# Patient Record
Sex: Male | Born: 1955 | Race: Black or African American | Hispanic: No | Marital: Single | Smoking: Never smoker
Health system: Southern US, Community
[De-identification: ages and names within clinical notes are randomized; demographics above are authoritative.]

## PROBLEM LIST (undated history)

## (undated) DIAGNOSIS — I4892 Unspecified atrial flutter: Secondary | ICD-10-CM

## (undated) DIAGNOSIS — I10 Essential (primary) hypertension: Secondary | ICD-10-CM

## (undated) DIAGNOSIS — I639 Cerebral infarction, unspecified: Secondary | ICD-10-CM

## (undated) DIAGNOSIS — I428 Other cardiomyopathies: Secondary | ICD-10-CM

## (undated) DIAGNOSIS — I509 Heart failure, unspecified: Secondary | ICD-10-CM

## (undated) HISTORY — PX: TRACHEOSTOMY: SUR1362

## (undated) HISTORY — DX: Unspecified atrial flutter: I48.92

---

## 2004-06-18 ENCOUNTER — Emergency Department (HOSPITAL_COMMUNITY): Admission: EM | Admit: 2004-06-18 | Discharge: 2004-06-18 | Payer: Self-pay | Admitting: Emergency Medicine

## 2004-07-09 ENCOUNTER — Emergency Department (HOSPITAL_COMMUNITY): Admission: EM | Admit: 2004-07-09 | Discharge: 2004-07-09 | Payer: Self-pay | Admitting: Emergency Medicine

## 2005-12-01 ENCOUNTER — Encounter (INDEPENDENT_AMBULATORY_CARE_PROVIDER_SITE_OTHER): Payer: Self-pay | Admitting: Cardiology

## 2005-12-01 ENCOUNTER — Inpatient Hospital Stay (HOSPITAL_COMMUNITY): Admission: EM | Admit: 2005-12-01 | Discharge: 2005-12-26 | Payer: Self-pay | Admitting: Emergency Medicine

## 2005-12-01 ENCOUNTER — Ambulatory Visit: Payer: Self-pay | Admitting: Critical Care Medicine

## 2005-12-11 ENCOUNTER — Ambulatory Visit: Payer: Self-pay | Admitting: Gastroenterology

## 2005-12-25 ENCOUNTER — Ambulatory Visit: Payer: Self-pay | Admitting: Dentistry

## 2005-12-26 ENCOUNTER — Inpatient Hospital Stay (HOSPITAL_COMMUNITY)
Admission: RE | Admit: 2005-12-26 | Discharge: 2006-01-22 | Payer: Self-pay | Admitting: Physical Medicine & Rehabilitation

## 2005-12-26 ENCOUNTER — Ambulatory Visit: Payer: Self-pay | Admitting: Physical Medicine & Rehabilitation

## 2006-01-24 ENCOUNTER — Ambulatory Visit: Payer: Self-pay | Admitting: Nurse Practitioner

## 2006-02-05 ENCOUNTER — Ambulatory Visit: Payer: Self-pay | Admitting: Nurse Practitioner

## 2006-02-14 ENCOUNTER — Encounter
Admission: RE | Admit: 2006-02-14 | Discharge: 2006-05-15 | Payer: Self-pay | Admitting: Physical Medicine & Rehabilitation

## 2006-02-24 ENCOUNTER — Ambulatory Visit: Payer: Self-pay | Admitting: Physical Medicine & Rehabilitation

## 2006-03-04 ENCOUNTER — Ambulatory Visit: Payer: Self-pay | Admitting: Nurse Practitioner

## 2006-04-15 ENCOUNTER — Encounter
Admission: RE | Admit: 2006-04-15 | Discharge: 2006-07-14 | Payer: Self-pay | Admitting: Physical Medicine & Rehabilitation

## 2006-04-23 ENCOUNTER — Ambulatory Visit: Payer: Self-pay | Admitting: Physical Medicine & Rehabilitation

## 2006-06-03 ENCOUNTER — Emergency Department (HOSPITAL_COMMUNITY): Admission: EM | Admit: 2006-06-03 | Discharge: 2006-06-03 | Payer: Self-pay | Admitting: Family Medicine

## 2006-06-30 ENCOUNTER — Encounter
Admission: RE | Admit: 2006-06-30 | Discharge: 2006-09-28 | Payer: Self-pay | Admitting: Physical Medicine & Rehabilitation

## 2006-06-30 ENCOUNTER — Ambulatory Visit: Payer: Self-pay | Admitting: Physical Medicine & Rehabilitation

## 2006-07-22 ENCOUNTER — Encounter
Admission: RE | Admit: 2006-07-22 | Discharge: 2006-08-15 | Payer: Self-pay | Admitting: Physical Medicine & Rehabilitation

## 2006-10-16 ENCOUNTER — Ambulatory Visit: Payer: Self-pay | Admitting: Family Medicine

## 2006-10-16 ENCOUNTER — Encounter (INDEPENDENT_AMBULATORY_CARE_PROVIDER_SITE_OTHER): Payer: Self-pay | Admitting: Nurse Practitioner

## 2006-10-16 LAB — CONVERTED CEMR LAB
Albumin: 4.8 g/dL (ref 3.5–5.2)
BUN: 15 mg/dL (ref 6–23)
CO2: 23 meq/L (ref 19–32)
Chloride: 102 meq/L (ref 96–112)
Creatinine, Ser: 0.93 mg/dL (ref 0.40–1.50)
Glucose, Bld: 213 mg/dL — ABNORMAL HIGH (ref 70–99)
HCT: 45.6 % (ref 39.0–52.0)
Hemoglobin: 15.1 g/dL (ref 13.0–17.0)
Lymphocytes Relative: 34 % (ref 12–46)
Lymphs Abs: 2.2 10*3/uL (ref 0.7–3.3)
MCV: 86.5 fL (ref 78.0–100.0)
Monocytes Relative: 7 % (ref 3–11)
Neutro Abs: 3.7 10*3/uL (ref 1.7–7.7)
Neutrophils Relative %: 56 % (ref 43–77)
PSA: 0.57 ng/mL (ref 0.10–4.00)
Total Bilirubin: 0.3 mg/dL (ref 0.3–1.2)

## 2006-10-22 ENCOUNTER — Encounter (INDEPENDENT_AMBULATORY_CARE_PROVIDER_SITE_OTHER): Payer: Self-pay | Admitting: Nurse Practitioner

## 2006-10-22 ENCOUNTER — Ambulatory Visit: Payer: Self-pay | Admitting: Family Medicine

## 2006-10-22 LAB — CONVERTED CEMR LAB
HDL: 37 mg/dL — ABNORMAL LOW (ref >39)
Total CHOL/HDL Ratio: 4.2
VLDL: 39 mg/dL (ref 0–40)

## 2007-01-11 ENCOUNTER — Emergency Department (HOSPITAL_COMMUNITY): Admission: EM | Admit: 2007-01-11 | Discharge: 2007-01-11 | Payer: Self-pay | Admitting: Emergency Medicine

## 2007-01-22 ENCOUNTER — Ambulatory Visit: Payer: Self-pay | Admitting: Internal Medicine

## 2007-02-12 ENCOUNTER — Ambulatory Visit: Payer: Self-pay | Admitting: Family Medicine

## 2007-02-12 ENCOUNTER — Encounter (INDEPENDENT_AMBULATORY_CARE_PROVIDER_SITE_OTHER): Payer: Self-pay | Admitting: Nurse Practitioner

## 2007-02-12 LAB — CONVERTED CEMR LAB
ALT: 26 units/L (ref 0–53)
Alkaline Phosphatase: 108 units/L (ref 39–117)
CO2: 24 meq/L (ref 19–32)
Calcium: 9.5 mg/dL (ref 8.4–10.5)
Eosinophils Absolute: 0.2 10*3/uL (ref 0.0–0.7)
Eosinophils Relative: 3 % (ref 0–5)
Glucose, Bld: 136 mg/dL — ABNORMAL HIGH (ref 70–99)
Hemoglobin: 14.6 g/dL (ref 13.0–17.0)
Lymphocytes Relative: 38 % (ref 12–46)
Lymphs Abs: 2.5 10*3/uL (ref 0.7–4.0)
Neutro Abs: 3.6 10*3/uL (ref 1.7–7.7)
RBC: 5.18 M/uL (ref 4.22–5.81)
RDW: 15 % (ref 11.5–15.5)
Total CHOL/HDL Ratio: 3.5
VLDL: 28 mg/dL (ref 0–40)
WBC: 6.7 10*3/uL (ref 4.0–10.5)

## 2007-02-26 ENCOUNTER — Ambulatory Visit: Payer: Self-pay | Admitting: Family Medicine

## 2007-05-26 ENCOUNTER — Ambulatory Visit: Payer: Self-pay | Admitting: Internal Medicine

## 2007-07-07 ENCOUNTER — Ambulatory Visit: Payer: Self-pay | Admitting: Internal Medicine

## 2007-07-07 ENCOUNTER — Encounter (INDEPENDENT_AMBULATORY_CARE_PROVIDER_SITE_OTHER): Payer: Self-pay | Admitting: Nurse Practitioner

## 2007-07-07 LAB — CONVERTED CEMR LAB
LDL Cholesterol: 62 mg/dL (ref 0–99)
Total CHOL/HDL Ratio: 3.9
Triglycerides: 140 mg/dL (ref <150)

## 2007-11-03 ENCOUNTER — Encounter: Admission: RE | Admit: 2007-11-03 | Discharge: 2008-01-07 | Payer: Self-pay | Admitting: Internal Medicine

## 2008-03-03 ENCOUNTER — Emergency Department (HOSPITAL_COMMUNITY): Admission: EM | Admit: 2008-03-03 | Discharge: 2008-03-03 | Payer: Self-pay | Admitting: Emergency Medicine

## 2008-04-28 DIAGNOSIS — G43909 Migraine, unspecified, not intractable, without status migrainosus: Secondary | ICD-10-CM | POA: Insufficient documentation

## 2008-04-28 DIAGNOSIS — E785 Hyperlipidemia, unspecified: Secondary | ICD-10-CM | POA: Insufficient documentation

## 2008-04-28 DIAGNOSIS — E119 Type 2 diabetes mellitus without complications: Secondary | ICD-10-CM | POA: Insufficient documentation

## 2008-04-28 DIAGNOSIS — Z8673 Personal history of transient ischemic attack (TIA), and cerebral infarction without residual deficits: Secondary | ICD-10-CM | POA: Insufficient documentation

## 2008-05-02 ENCOUNTER — Ambulatory Visit: Payer: Self-pay | Admitting: Cardiovascular Disease

## 2008-05-02 DIAGNOSIS — R9431 Abnormal electrocardiogram [ECG] [EKG]: Secondary | ICD-10-CM

## 2008-05-17 ENCOUNTER — Encounter: Payer: Self-pay | Admitting: Cardiovascular Disease

## 2008-05-17 ENCOUNTER — Ambulatory Visit: Payer: Self-pay

## 2008-06-02 ENCOUNTER — Ambulatory Visit: Payer: Self-pay | Admitting: Cardiovascular Disease

## 2008-06-02 DIAGNOSIS — I429 Cardiomyopathy, unspecified: Secondary | ICD-10-CM | POA: Insufficient documentation

## 2008-08-08 ENCOUNTER — Encounter: Admission: RE | Admit: 2008-08-08 | Discharge: 2008-10-06 | Payer: Self-pay | Admitting: Family Medicine

## 2008-11-17 ENCOUNTER — Emergency Department (HOSPITAL_COMMUNITY): Admission: EM | Admit: 2008-11-17 | Discharge: 2008-11-17 | Payer: Self-pay | Admitting: Family Medicine

## 2009-05-10 ENCOUNTER — Encounter: Admission: RE | Admit: 2009-05-10 | Discharge: 2009-08-08 | Payer: Self-pay | Admitting: Family Medicine

## 2009-07-04 ENCOUNTER — Ambulatory Visit (HOSPITAL_COMMUNITY): Admission: RE | Admit: 2009-07-04 | Discharge: 2009-07-04 | Payer: Self-pay | Admitting: Cardiovascular Disease

## 2009-07-04 ENCOUNTER — Ambulatory Visit: Payer: Self-pay | Admitting: Cardiovascular Disease

## 2009-07-04 ENCOUNTER — Ambulatory Visit: Payer: Self-pay | Admitting: Cardiology

## 2009-07-04 ENCOUNTER — Ambulatory Visit: Payer: Self-pay

## 2010-01-28 ENCOUNTER — Encounter: Payer: Self-pay | Admitting: Physical Medicine & Rehabilitation

## 2010-02-06 NOTE — Assessment & Plan Note (Signed)
Summary: [name]/DM   Visit Type:  1 yr f/u Primary Provider:  Raquel James  CC:  no cardiac complaints today.  History of Present Illness: 55 yo AAM with history of DM, hyperlipidemia and prior CVA in 2007 here today for cardiac follow up. The patient had a large CVA in November 2007 and since then has been disabled with some residual right sided weakness and speech difficulty. He was seen by his primary care physician in May 2010 and had an abnormal EKG which showed nonspecific T wave abnormalities and first degree AV block. Echo showed normal LV systolic function with moderate LVH. He is here today for repeat echo and visit. The echo is outlined in detail below.   No CP, SOB, palpitations. ROS o/w negative.   Current Medications (verified): 1)  Glipizide 10 Mg Tabs (Glipizide) .[name].. 1 Tab Once Daily 2)  Aspirin 81 Mg Tbec (Aspirin) .... Take One Tablet By Mouth Daily 3)  Metformin Hcl 1000 Mg Tabs (Metformin Hcl) .[name].. 1 Tab Two Times A Day 4)  Simvastatin 40 Mg Tabs (Simvastatin) .[name].. 1 Tab At Bedtime 5)  Senna 8.6 Mg Caps (Sennosides) .... As Needed 6)  Multivitamins   Tabs (Multiple Vitamin) .[name].. 1 Tab Once Daily 7)  Fish Oil 1000 Mg Caps (Omega-3 Fatty Acids) .... 2 Caps Daily  Allergies (verified): No Known Drug Allergies  Past History:  Past Medical History: MIGRAINE HEADACHE (ICD-346.90) CVA (ICD-434.91) DIABETES MELLITUS, TYPE II (ICD-250.00) HYPERLIPIDEMIA-MIXED (ICD-272.4) Moderate LVH    Social History: Reviewed history from 05/02/2008 and no changes required. Tobacco Use - Former, quit 2007 Alcohol Use - no -- heavy until Nov 07  Middle school dropout Disabled  Review of Systems  The patient denies fatigue, malaise, fever, weight gain/loss, vision loss, decreased hearing, hoarseness, chest pain, palpitations, shortness of breath, prolonged cough, wheezing, sleep apnea, coughing up blood, abdominal pain, blood in stool, nausea, vomiting, diarrhea, heartburn,  incontinence, blood in urine, muscle weakness, joint pain, leg swelling, rash, skin lesions, headache, fainting, dizziness, depression, anxiety, enlarged lymph nodes, easy bruising or bleeding, and environmental allergies.    Vital Signs:  Patient profile:   55 year old male Height:      66 inches Weight:      168 pounds BMI:     27.21 Pulse rate:   64 / minute Pulse rhythm:   regular BP sitting:   124 / 80  (left arm) Cuff size:   regular  Vitals Entered By: Danielle Rankin, CMA (July 04, 2009 9:50 AM)  Physical Exam  General:  General: Well developed, well nourished, NAD HEENT: OP clear, mucus membranes moist Psychiatric: Mood and affect normal Neck: No JVD, no carotid bruits, no thyromegaly, no lymphadenopathy. Lungs:Clear bilaterally, no wheezes, rhonci, crackles CV: RRR no murmurs, gallops rubs Abdomen: soft, NT, ND, BS present Extremities: No edema, pulses 2+.    Echocardiogram  Procedure date:  07/04/2009  Findings:      Study Conclusions            - Left ventricle: There is mild hypokinesis of the inferior wall.       There may be pseudonormalization of the mitral inflow signal. The       estimated ejection fraction was 55%.     - Left atrium: The atrium was moderately dilated.     - Pericardium, extracardiac: A trivial pericardial effusion was       identified posterior to the heart.  EKG  Procedure date:  07/04/2009  Findings:  Sinus rhythm with 1st degree AV block. T wave changes inferolaterally.   Impression & Recommendations:  Problem # 1:  CARDIOMYOPATHY, SECONDARY (ICD-425.9) Mild to  moderate LVH with normal LV systolic function.  Doing well. Blood pressure is normal. Will repeat echo in one year.   His updated medication list for this problem includes:    Aspirin 81 Mg Tbec (Aspirin) .[name]... Take one tablet by mouth daily  Patient Instructions: 1)  Your physician recommends that you schedule a follow-up appointment in: 12 months 2)  Your  physician recommends that you continue on your current medications as directed. Please refer to the Current Medication list given to you today. 3)  Your physician has requested that you have an echocardiogram.  Echocardiography is a painless test that uses sound waves to create images of your heart. It provides your doctor with information about the size and shape of your heart and how well your heart's chambers and valves are working.  This procedure takes approximately one hour. There are no restrictions for this procedure. To be done in 12 months

## 2010-04-11 LAB — POCT URINALYSIS DIP (DEVICE)
Glucose, UA: NEGATIVE mg/dL
Ketones, ur: NEGATIVE mg/dL
Nitrite: NEGATIVE
Protein, ur: NEGATIVE mg/dL
Specific Gravity, Urine: <=1.005 (ref 1.005–1.030)
pH: 6 (ref 5.0–8.0)

## 2010-04-24 LAB — DIFFERENTIAL
Lymphs Abs: 2.6 10*3/uL (ref 0.7–4.0)
Monocytes Relative: 6 % (ref 3–12)
Neutro Abs: 3.3 10*3/uL (ref 1.7–7.7)
Neutrophils Relative %: 49 % (ref 43–77)

## 2010-04-24 LAB — POCT I-STAT, CHEM 8
BUN: 13 mg/dL (ref 6–23)
Calcium, Ion: 1.15 mmol/L (ref 1.12–1.32)
HCT: 47 % (ref 39.0–52.0)
Potassium: 3.8 mEq/L (ref 3.5–5.1)

## 2010-04-24 LAB — SEDIMENTATION RATE: Sed Rate: 10 mm/hr (ref 0–16)

## 2010-04-24 LAB — CBC
MCHC: 33.8 g/dL (ref 30.0–36.0)
Platelets: 175 10*3/uL (ref 150–400)

## 2010-04-24 LAB — GLUCOSE, CAPILLARY: Glucose-Capillary: 59 mg/dL — ABNORMAL LOW (ref 70–99)

## 2010-05-22 NOTE — Assessment & Plan Note (Signed)
The patient returns to the clinic today accompanied by his mother. The  patient is doing reasonably well overall. He does have the need for some  dental care and they are requesting a slip stating that he can undergo  general anesthesia. He continues to take his Glucophage and his recent  blood sugar was 165. He is using a quad cane most of the time for  ambulation and mostly independent with bathing and dressing. He does  need a refill on the Glucophage and pravastatin as he has no primary  care physician.   REVIEW OF SYSTEMS:  Noncontributory.   MEDICATIONS:  1. Metformin 500 mg 0.5 tablet b.i.d.  2. Pravastatin 40 mg.  3. Aspirin 81 mg.  4. Sof-Gel daily.  5. Multivitamin daily.   PHYSICAL EXAMINATION:  GENERAL APPEARANCE: Reasonably well-appearing,  middle-aged, adult male with significant deficits involving his left  upper and left lower extremity.  VITAL SIGNS: Blood pressure is 162/97 with a pulse of 101, respiratory  rate 23 and 02 saturation 93% on room air.  EXTREMITIES: Left upper and left lower extremity strength was 2/5 and  right upper and lower extremity strength was 4+/5.   IMPRESSION:  1. Status post right middle cerebral artery infarct with vessel      perforation and subsequent subarachnoid hemorrhage.  2. Type 2 diabetes mellitus.  3. Dyslipidemia.   In the office today we did give the patient a refill on his metformin  along with his pravastatin. Will plan on seeing him in followup in  approximately 3 to 4 months time with refills prior to that appointment  as necessary. Apparently he does not attend health or primary care  office at this time.           ______________________________  Ellwood Dense, M.D.     DC/MedQ  D:  07/03/2006 11:57:49  T:  07/03/2006 13:44:01  Job #:  [id number]

## 2010-05-25 NOTE — Assessment & Plan Note (Signed)
Mr. Kelleher returns to the clinic today accompanied by his mother.  He  is a 55 year old adult male with history of migraine headaches who  suffered left middle cerebral artery stroke.  He had a merci clot  retrieval done after a cerebral angiogram showed a blockage.  He was on  a ventilator for a short period of time and required a tracheostomy.  He  was on the rehabilitation unit through January 22, 2006.   The patient continues to attend outpatient physical and occupational  therapy at [facility].  He still needs some help with bathing and  dressing but now is able to walk slowly with a quad cane with a  hemiparetic gait on the right side.  He is due to follow up with [facility] May 07, 2006 but needs a refill on his metformin in the office  today.  His mother reports his blood sugar this morning was 147.   REVIEW OF SYSTEMS:  Noncontributory.   MEDICATIONS:  1. Metformin 500 mg one-half tablet b.i.d.  2. Pravastatin 40 mg daily.  3. Aspirin 81 mg daily.  4. Soft gels p.r.n.  5. Multivitamin daily.   PHYSICAL EXAMINATION:  This is a reasonably well appearing, middle-aged  adult male with significant aphasia and significant comprehension  deficits.  He also has significant right-sided weakness.  Blood pressure  is 120/73, with a pulse 71, respiratory rate 16, O2 saturation 98% on  room air.  Left upper and left lower extremity strength were 5/5.  Right  upper extremity strength was 2/5 and right lower extremity strength was  3-/5.   IMPRESSION:  1. Status post right middle cerebral artery infarct with vessel      perforation and subsequent subarachnoid hemorrhage.  2. Type 2 diabetes mellitus.  3. Dyslipidemia.   In the office today we did refill the patient's metformin as noted  above.  We will plan on seeing him in followup in approximately 2 to 3  months' time with refill of medication as necessary.  He will continue  in outpatient therapies at this  point.           ______________________________  Ellwood Dense, M.D.     DC/MedQ  D:  04/24/2006 09:56:57  T:  04/24/2006 11:09:11  Job #:  [id number]

## 2010-05-25 NOTE — Op Note (Signed)
[name], [name]             ACCOUNT NO.:  [id number]   MEDICAL RECORD NO.:  [id number]          PATIENT TYPE:  INP   LOCATION:  2311                         FACILITY:  [facility]   PHYSICIAN:  Ollen Gross. Vernell Morgans, M.D. DATE OF BIRTH:  [birth year]   DATE OF PROCEDURE:  12/06/2005  DATE OF DISCHARGE:                               OPERATIVE REPORT   PREOPERATIVE DIAGNOSIS:  Stroke and ventilator dependent respiratory  failure.   POSTOPERATIVE DIAGNOSIS:  Stroke and ventilator dependent respiratory  failure.   PROCEDURE:  Tracheostomy.   SURGEON:  Ollen Gross. Vernell Morgans, M.D.   ANESTHESIA:  General endotracheal.   PROCEDURE:  After informed consent was obtained, the patient was brought  to the operating room, placed in supine position on the operating table.  After induction of general anesthesia, a roll was placed behind the  patient's shoulders to extend the neck slightly.  The neck was then  prepped with Betadine and draped in usual sterile manner.  The patient's  thyroid and cricoid cartilages were easy to palpate. A small vertically  oriented incision was made from the palpable cricoid cartilage  inferiorly for about 2 cm.  This incision was carried down through the  skin and subcutaneous tissue sharply with electrocautery.  Taking care  to keep the dissection along the midline until the tracheostomy was  reached.  The cricoid cartilage and tracheal rings were easily  identified.  At this point a tracheostomy hook was placed beneath the  cricoid cartilage and the trachea was elevated.  The space between the  second and third tracheal rings was opened sharply with electrocautery  and then bluntly with a hemostat.  At this point a size 8 Shiley trache  tube was chosen and the anesthesiologist let the balloon on the  endotracheal tube down and backed the tube up until it was just above  the opening that was made.  The new tracheostomy tube was then inserted  into the trachea through the  opening between rings.  The balloon was  inflated in the tracheostomy was connected to the ventilator.  The  patient had good chest rise, good CO2 return and good oxygen  saturations.  The tracheostomy tube was then anchored to the skin in  several places with 2-0 silk stitches and then the sterile dressings  were applied.  The patient tolerated procedure well.  At the end of  case, all needle, sponge instrument counts correct.  The patient was  then taken back to the ICU for further management.      Ollen Gross. Vernell Morgans, M.D.  Electronically Signed     PST/MEDQ  D:  12/08/2005  T:  12/09/2005  Job:  [id number]

## 2010-05-25 NOTE — H&P (Signed)
NAMESHAHZAIN, KIESTER             ACCOUNT NO.:  [id number]   MEDICAL RECORD NO.:  [id number]          PATIENT TYPE:  IPS   LOCATION:  NA                           FACILITY:  [facility]   PHYSICIAN:  Ellwood Dense, M.D.   DATE OF BIRTH:  [birth year]   DATE OF ADMISSION:  DATE OF DISCHARGE:                              HISTORY & PHYSICAL   PRIMARY CARE PHYSICIAN:  None.   NEUROLOGIST:  Pramod P. Pearlean Brownie, M.D.   SURGEON:  Ollen Gross. Vernell Morgans, M.D.   GASTROINTESTINAL SPECIALIST:  Barbette Hair. Arlyce Dice, MD,FACG   HISTORY OF THE PRESENT ILLNESS:  [name] is a previously-healthy  55 year old adult male with a history of migraine headaches.  He was  admitted on November 30, 2005 with a new onset of aphasia with right-  sided weakness.  This was found to be secondary to an occlusion in the  left middle cerebral artery.  He underwent cerebral angiogram with  attempts at a MERCI clot retrieval with vessel perforation and  subsequent subarachnoid hemorrhage.  He required a ventilator for  respiratory failure and was intubated with difficulty weaning.  He  required a tracheostomy placed December 06, 2005 by Dr. Carolynne Edouard, and a PEG  was placed on December 06, 2005 by Dr. Arlyce Dice for nutritional support.  He was treated with Rocephin IV for H. influenza pneumonia.   The patient was eventually extubated and was tolerating a T collar on  his tracheostomy.  He is presently tolerating room air with Passey-Muir  valve at all times.  He has been tolerating bolus feeds per his PEG tube  with water flushes added December 25, 2005 secondary to worsening renal  insufficiency.  Dr. Kristin Bruins was consulted for multiple dental caries.  The patient will require extraction with alveoloplasty with the need to  be off anticoagulation.  He is receiving chlorhexidine rinses for his  dental caries at the present time.  He is noted to have global aphasia  with dense right hemiparesis and a right field cut with decreased  attention.  He is able to sit at the edge of the bed 5-8 minutes and  perform some grooming activities.  He stands with posturing and with  truncal control that is slowly improving.   The patient was evaluated by the rehabilitation physicians and felt to  be an appropriate candidate for inpatient rehabilitation.   REVIEW OF SYSTEMS:  Positive for wound healing, along with weakness and  reported right shoulder pain.   PAST MEDICAL HISTORY:  History of migraines.   FAMILY HISTORY:  Positive for migraines and diabetes mellitus.   SOCIAL HISTORY:  The patient lives with his elderly mother.  The patient  is unemployed but works Engineer, agricultural odd jobs.  He smokes one pack of  cigarettes per day.  He has a history of heavy ethanol use, but  apparently quit alcohol one month prior to admission.   FUNCTIONAL HISTORY PRIOR TO ADMISSION:  Independent.   ALLERGIES:  No known drug allergies.   MEDICATIONS PRIOR TO ADMISSION:  None.   RECENT LABORATORIES:  Hemoglobin of 13.2, hematocrit 39.8, platelets  274,000,  white count of 7.8.  Recent sodium was 140, potassium 3.8,  chloride 102, CO2 of 30, BUN 26, creatinine 0.8 and glucose 116.  Recent  iron was 12 with TIBC of 24.  B-12 level was 453, and iron saturation  was 5%.  Recent ferritin was 329.  Cholesterol was 181, triglycerides  187, HDL cholesterol 67, and LDL cholesterol of 118.  Hemoglobin A1C was  6.3.  Bronchial alveolar culture showed non-pathogenic flora.   Chest x-ray on December 21, 2005 showed improving aeration in the right  middle lobe with persistent consolidation in the right base with mild  atelectasis of the left base.   PHYSICAL EXAMINATION:  VITAL SIGNS:  Temperature 97.8, respiratory rate  20, pulse 78 and blood pressure 103/68.  GENERAL:  Reasonably well-appearing middle aged adult male lying in bed  with his eyes open and following our conversation, but not consistently  conversant.  He has a tracheostomy in place with a  Passey-Muir valve.  HEENT:  Normocephalic and atraumatic.  CARDIOVASCULAR:  Regular rate and rhythm.  S1 and S2 without murmurs.  ABDOMEN:  Soft, nontender, with positive bowel sounds with PEG tube in  place without drainage.  LUNGS:  Clear to auscultation bilaterally.  NEUROLOGIC:  Alert, oriented times unknown with patient unable to voice  more than a few soft sounds which are repeated consistently.  He has  dense hemiparesis of the right arm and leg.  The patient has right  neglect with good strength in his left upper and left lower extremity.  He is unable to follow commands for manual muscle testing.  Sensation  appeared to be intact to light touch on his bilateral face.  He did  follow occasional simple single-step commands.   IMPRESSION:  1. Status post left middle cerebral artery ischemic stroke with      subsequent iatrogenic bleed after attempted MERCI clot retrieval      done November 30, 2005.  2. Severe dysphagia requiring the patient to be NPO with tube feedings      at the present time.  3. Ventilatory-dependent respiratory failure, stable and weaning from      O2 with down-sizing of tracheostomy planned and subsequent      discontinuing of tracheostomy over the next few days.   Presently, the patient has deficits in ADL's, transfers, ambulation,  speech and cognition secondary to his left middle cerebral artery  ischemic infarction and subsequent subarachnoid hemorrhage.   PLAN:  1. Admit to the rehabilitation unit for daily OT, PT and speech      therapies, to advance ADL's, transfers, ambulation, cognition,      swallowing, and speech.  2. Check admission labs including CBC and CMET Friday, December 27, 2005.  3. Down-size tracheostomy to #4 cuffless by respiratory therapy.  4. NPO with Jevity tube feedings 320 cc five times per day with water      flushes of 100 cc five times per day. 5. Continue Lovenox 40 mg subcutaneous daily for DVT prophylaxis.  6.  Continue aspirin 81 mg per PEG daily.  7. Continue Lasix 40 mg daily.  8. Lantus insulin 8 units subcutaneous q 7 p.m. and 4 units      subcutaneous q 7 a.m.  9. Routine PEG and tracheostomy care.  10.Decrease Seroquel to 25 mg per PEG q.h.s.  11.Klonopin 0.5 mg q.h.s. per PEG.  12.Cleanse oral cavity with Magic mouthwash on a __________ q.i.d. and  p.r.n.  13.Check PVR with bladder scans q shift, and in-and-out cath for      volumes greater than or equal to 300 cc.  14.Plug tracheostomy 24 hours a day with a Passey-Muir valve and O2      supplemented by nasal cannula.  15.Keep right upper extremity elevated on a pillow and then sling when      out of bed.  16.Check CBCs 5 times per day before scheduled bolus feeds.  17.Tylenol 325 mg 1-2 tablets per PEG q.4h. for pain.   PROGNOSIS:  Fair.   ESTIMATED LENGTH OF STAY:  15-25 days.   GOALS:  Minimal to moderate assistance for ADL's, transfers and  ambulation with hopeful discontinuation of tracheostomy over the next  few days and initiation of oral feeds as appropriate over the next  couple weeks.           ______________________________  Ellwood Dense, M.D.     DC/MEDQ  D:  12/26/2005  T:  12/26/2005  Job:  [id number]

## 2010-05-25 NOTE — Procedures (Signed)
CLINICAL HISTORY:  This is a portable EEG done at the patient's bedside.  The patient is described as unresponsive 55 year old male with history of  left brain stroke, EEG is for evaluation.   DESCRIPTION:  The dominant rhythm in this tracing is a very low amplitude  mixed theta-delta rhythm of 3-6 hertz which appears diffusely more  prominently in the posterior section and appears without obvious abnormal  asymmetry.  The supression and focal slowing seems more prominent on the  right especially in the temporal areas versus the left but depression is  fairly severe on both sides.  No epileptiform discharges are seen, no sleep  architecture is noted.  Photic stimulation and hyperventilation are not  performed.  Single channel devoted to EKG revealed sinus rhythm throughout  with greater than approximately 66 beats per minute.   CONCLUSIONS:  Abnormal study due to the presence of generalized slowing and  depression of background rhythms.  Finding suggestive of diffuse widespread  cerebral dysfunction consistent given history of encephalopathic state.  Findings are more severe on the left than on the right as might be seen with  the patient history of left brain stroke.  No epileptiform discharges are  seen.      Michael L. Thad Ranger, M.D.  Electronically Signed     [id number]  D:  12/02/2005 12:01:43  T:  12/02/2005 13:09:48  Job #:  [id number]

## 2010-05-25 NOTE — Discharge Summary (Signed)
[name], [name]             ACCOUNT NO.:  [id number]   MEDICAL RECORD NO.:  [id number]          PATIENT TYPE:  IPS   LOCATION:  4028                         FACILITY:  [facility]   PHYSICIAN:  Ellwood Dense, M.D.   DATE OF BIRTH:  07-31-1955   DATE OF ADMISSION:  12/26/2005  DATE OF DISCHARGE:  01/22/2006                               DISCHARGE SUMMARY   DISCHARGE DIAGNOSES:  1. 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  2. Diabetes mellitus type 2.  3. Dyslipidemia.   HISTORY OF PRESENT ILLNESS:  [name] is a 55 year old male with  history of migraines admitted November 24 with aphasia, right-sided  weakness secondary to left MCA stroke.  The patient underwent cerebral  angio with attempt at Merci clot retrieval with vessel perforation and  subarachnoid hemorrhage.  He was noted to have VDRF requiring intubation  and difficult __________ requiring trach November 30 by Dr. Carolynne Edouard and PEG  placed the same day by Dr. Arlyce Dice for nutritional support.  Was noted to  have H. flu pneumonia treated with Rocephin.  The patient extubated and  has been tolerating T collar, currently wearing Passy-Muir valve at all  times, able to tolerate bolus tube feeds.  He was noted to have multiple  dental caries; will require extractions and alveoplasty in the future  and will need to be off anticoagulation prior to this procedure.  Currently, the patient continues with global aphasia, dense right  hemiparesis, right field cut with inattention.  Able to sit at the edge  of bed with attempts at standing and postural truncal control ongoing.  Secondary to impairments in mobility and self-care, rehab consulted for  further therapies.   PAST MEDICAL HISTORY:  Significant for migraines.   ALLERGIES:  No known drug allergies.   FAMILY HISTORY:  Is positive for migraines and diabetes.   SOCIAL HISTORY:  The patient lives with the family.  Was  working odd  jobs prior to admission.  Has history of 1 pack per day tobacco use,  history of heavy alcohol use in the past, quit approximately a month  prior to admission.   HOSPITAL COURSE:  [name] was admitted to rehab on December 26, 2005 for inpatient therapies to consist of PT/OT daily.  Past  admission, he was maintained on subcu Lovenox for DVT prophylaxis and  low-dose aspirin for CVA prophylaxis.  CBGs were monitored with  a.c./h.s. CBG checks.  Hemoglobin A1c was noted to be elevated at 6.3.  Initially, the patient with sliding scale insulin secondary to tube  feeds.  Follow-up swallow study was done on December 27.  The patient  was started on D2 diet, thin liquids and was tolerating this with good  p.o. intake.  Diet has been advanced to D3, and currently, p.o. intake  is good.  CBG checks have been continued and __________ to elevated  blood sugars, he was started on Glucophage 250 mg b.i.d.  Blood sugars  currently ranging from 90s to 110.  Blood pressures monitored on b.i.d.  basis, noted to  be in high 90s to 110 systolic, 60s to 56O diastolic,  heart rate in 60s to 70s range.  The patient is noted to be continent of  bowel and bladder with toileting.   Labs done past admission revealed hemoglobin 12.8, hematocrit 37.6,  white count 7.8, platelets 224.  Check of electrolytes initially  revealed BUN elevated at 25.  Recheck on December 28 shows improvement  with sodium 137, potassium 4.3, chloride 101, CO2 26, BUN 18, creatinine  0.9, glucose 91.  LFTs at admission showed elevation in AST at 42 with  low albumin level at 3.1.  Recheck LFTs from January 10 reveals albumin  3.1, AST 35, alkaline phos 83, ALT 36, T bili 0.5.  Repeat hemoglobin  A1c noted to be elevated at 7.0.  The patient currently continues on  Pravachol 40 mg p.o. q.h.s.   Safety has been an issue secondary to patient's aphasia and cognitive  deficits.  The patient is currently able to  follow one-step commands 60%  accuracy with max assist, able to verbalize automatic speech 60%  accuracy, able to match single written words to objects with 60%  accuracy with mod assist, able to use greeting and able to cue greeting  with initiation 3 out of 5 times.  Single word discriminations improved  to 90% accuracy with semantic cues.  The patient currently follows with  one-step commands with max assist, auditory comprehension 60% accurate  for yes/no basic question.  Verbal expression is limited to phonic and  simple phrases with cuing and repetition.  Complex level comprehension  high level expression is impaired.  He is able to copy his first name.  He is able to focus and sustain attention for duration of treatment  section for up to 60 minutes.  Memory is not tested secondary to  aphasia.  He continues to require mod assist secondary to decreased  safety awareness, moderate apraxic and decreased reasoning secondary to  language deficits.  Currently, the patient is at min assist for balance  activity with assist for hip and hip control.  He is able to advance  right lower extremity with platform rolling walker with +2 total assist  75% to facilitate hip and right knee control for up to 25-50 feet.  He  is supervision for navigating his wheelchair 150 feet with verbal cues  for technique.  Currently, right shoulder passive range of motion is  greater than 100 degrees.  He required mod questioning cues for solving  in bathing, dressing and grooming.  Max assist for ADLs.  Family  education has been completed regarding need for 24 hour supervision  assistance.  The patient will continue his further progressive home  health PT, OT, speech therapy, R.N. as well as social worker past  discharge.  On January 22, 2006, the patient is discharged to home.   DISCHARGE MEDICATIONS:  1. Coated aspirin 81 mg a day.  2. Glucophage 500 mg half p.o. b.i.d. 3. Pravachol 40 mg q.h.s.  4.  Senokot-S 2 p.o. q.h.s.  5. Multivitamin with iron 1 per day.   DIET:  Is diabetic diet.   WOUND CARE:  Keep area clean and dry.   ACTIVITIES:  Twenty-four supervision assistance.  No alcohol, no  smoking, no driving.   FOLLOW UP:  The patient to follow up with Dr. Thomasena Edis February 11 at  11:00 a.m., follow up with [facility] January 17 for eligibility and  M.D. appointment January 18 at 9:15 a.m.  Follow up with  Dr. Pearlean Brownie in 4  weeks.   SPECIAL INSTRUCTIONS:  [facility] to provide PT, OT, speech  therapy, R.N. and Child psychotherapist.      Greg Cutter, P.A.    ______________________________  Ellwood Dense, M.D.    PP/MEDQ  D:  01/22/2006  T:  01/23/2006  Job:  [id number]   cc:   [place]heral [place], M.D.  Carolynne Edouard, M.D.

## 2010-05-25 NOTE — Assessment & Plan Note (Signed)
[name] is a 55 year old adult male with a history of migraine  headaches, who is being seen in the office today for follow-up after his  recent stroke.  He was admitted November 30, 2005, with aphasia along  with right-sided weakness.  He was felt to have a left middle cerebral  artery stroke.  He underwent a cerebral angiogram with an attempt at  MERCI clot retrieval.  There was vessel perforation and the patient  suffered a subarachnoid hemorrhage.  He required ventilatory assistance  for respiratory failure.  He eventually had a tracheostomy December 06, 2005, by Dr. Carolynne [name].  A PEG tube was placed the same day by Dr. Arlyce [name] for  nutritional support.  The patient eventually stabilized and was moved to  the rehabilitation unit December 26, 2005, and remained there through  discharge January 22, 2006.   Since discharge the patient has been eating a regular diet and feeding  himself.  He is continent of bowel and bladder.  He does require some  assistance for bathing and dressing.  He walks with a quad cane inside  the home and occasionally uses a wheelchair inside the home but uses a  wheelchair consistently outside the home.  He receives home health  physical, occupational and speech therapy.  His family reports that his  blood sugars have been in the 103-130 range.  He continues to receive  primary care through [facility].  The patient reportedly has good  comprehension by family members.   MEDICATIONS:  1. Metformin 500 mg one-half tablet b.i.d.  2. Pravastatin 40 mg daily.  3. Aspirin 81 mg daily.  4. Soft Gels p.r.n.  5. Multivitamin daily.   REVIEW OF SYSTEMS:  Noncontributory.   PHYSICAL EXAMINATION:  GENERAL:  A well-appearing, middle-aged adult  male with significant aphasia and comprehension deficits.  VITAL SIGNS:  Blood pressure is 118/69 with pulse 70, respiratory rate  18 and O2 saturation 100% on room air.  MUSCULOSKELETAL/NEUROLOGIC:  He is unable to follow  simple one-step  commands.  He is unable to tell me the name of his sister or mother in  the office today.  He does have some minor vocal production but no  intelligible words are produced.  He has significant paralysis of the  right upper and right lower extremity.  Left upper and left lower  extremity strength were 5/5 with right upper and right lower extremity  strength were 3-/5.   IMPRESSION:  1. Status post right middle cerebral artery infarct with vessel      perforation and subsequent subarachnoid hemorrhage.  2. Type 2 diabetes mellitus.  3. Dyslipidemia.   In the office today no medication adjustments were made.  He will  continue to use Tylenol or anti-inflammatory medication for mild pain  that he has on an occasional basis.  I have asked his family to record  his blood sugars and take that into his primary care physician at  [facility] to see if he could come off his metformin.  He will continue  on that at half a tablet twice a day as present.  We will plan on seeing  the patient in follow-up in approximately 2 months' time with  continuation of home health therapies at this point.           ______________________________  [name], M.D.     DC/MedQ  D:  02/24/2006 13:32:59  T:  02/24/2006 15:18:01  Job #:  [id number]

## 2010-05-25 NOTE — Consult Note (Signed)
NAMEFLOYDE, DINGLEY             ACCOUNT NO.:  [id number]   MEDICAL RECORD NO.:  [id number]          PATIENT TYPE:  INP   LOCATION:  2311                         FACILITY:  [facility]   PHYSICIAN:  Ollen Gross. Carolynne Edouard, M.D.     DATE OF BIRTH:  07-Jan-1956   DATE OF CONSULTATION:  12/05/2005  DATE OF DISCHARGE:                                 CONSULTATION   NEUROLOGIST:  Deanna Artis. Sharene Skeans, M.D.   PRIMARY CARE PHYSICIAN:  Unknown.   REASON FOR CONSULTATION:  Ventilator-dependent respiratory failure, now  in need of tracheostomy tube.   HISTORY OF PRESENT ILLNESS:  Mr. Parthasarathy is a 55 year old male patient  who was admitted November 30, 2005 with acute CVA and right hemiparesis.  He was found to have a clot in the middle cerebral artery.  He was taken  to interventional radiology rather quickly, as per protocol.  There was  an attempt to remove this clot, and they had some difficulty that  resulted in some vascular perforation at the clot site.  The patient has  been intubated since that time, and critical care medicine recommends a  tracheostomy for long-term airway management.  The patient is also  scheduled for PEG tube placement at 7:45 tomorrow, December 06, 2005.  Surgical evaluation has been requested.   REVIEW OF SYSTEMS:  I am unable to obtain from the patient.  He is not  responsive and on the vent and nonverbal.   SOCIAL HISTORY:  Positive for tobacco. Documented history of prior heavy  alcohol use, although according to the family the patient had not had  anything to drink in the 24-48 hours prior to coming to the hospital. He  does live with his parents, and it is documented that he quit school in  middle school.   FAMILY HISTORY:  Noncontributory.   PAST MEDICAL HISTORY:  1. Migraines.  2. Myopia.  3. History of alcoholism, based on reported history.   PAST SURGICAL HISTORY:  Unable to obtain.  None documented in other  notes.   ALLERGIES:  NKDA.   CURRENT  MEDICATIONS:  Peridex orally to rinse mouth when on ventilator,  Klonopin, Seroquel, albuterol, Protonix, ProMod lactose-free tube  feeding, Cardene IV, Reglan IV, and several p.r.n. medications.   PHYSICAL EXAM:  GENERAL:  This is an unresponsive male patient, status  post CVA, on mechanical ventilation.  VITAL SIGNS:  T-max in the past 24 hours has been 102 degrees Fahrenheit  orally, BP 122/65, pulse 62 and regular, respirations 15.  NEUROLOGIC:  To significant pain stimulation, the patient will open his  eyes, but no definite eye contact. He has occasional spontaneous  movement of the left side. Therefore, at times he has required  restraints.  He does have residual right hemiparesis. Otherwise, at the  present time the patient is currently sedated.  He is intubated, so  speech cannot be evaluated.  HEENT:  Head is normocephalic sclera are noninjected.  NECK:  Supple.  There is an endotracheal tube in the mouth.  Lips appear  be somewhat swollen and tongue slightly swollen. The patient is unable  to manage secretions orally.  CHEST:  Clear to auscultation anteriorly.  The patient is on the  ventilator, currently at CPAP settings, with an FiO2 of 30%. The patient  does require reversion back to mechanical ventilation with ventilatory  rate support during the day and evening hours after he has been on a  trial of CPAP.  CARDIAC:  S1, S2, without obvious rubs, murmurs, thrills, or gallops.  Pulses regular.  No JVD.  ABDOMEN:  Soft, nontender, nondistended.  He has a Panda tube down the  nares, with tube feeding infusing.  GENITOURINARY:  Foley catheter is in place, draining yellow urine to the  bedside bag.  EXTREMITIES:  Show trace dependent edema, especially in the hands and  feet.  The toenails are unkempt and  darkened and appear to have not  been cut in quite some time.   LABORATORIES:  White count 11,500, hemoglobin 10.6, platelets 143,000.  Sodium 135, potassium 4, CO2 23,  glucose 151, BUN 12, creatinine 0.7.  PTT 27.  PT 13.3, INR 1.  The patient has undergone fiberoptic  bronchoscopy, with subsequent bronchial lavage. This culture is positive  for greater than 100,000 colonies of Haemophilus influenzae that is beta  lactamase negative.   DIAGNOSTICS:  Portable chest x-ray done December 05, 2005 shows no acute  process.   IMPRESSION:  Ventilator-dependent respiratory failure as a result of  significant neurological deficit, now in need of trache.   PLAN:  1. Tracheostomy to in the O.R. on December 06, 2005. This is to follow      up the patient's already-scheduled procedure for early morning PEG      placement at 7:30.  2. Dr. Carolynne Edouard will talk to the patient's family later today to discuss      the risks and benefits of this procedure. This will be done prior      to obtaining an operative permit from the family.  3. Will make sure the patient is n.p.o. after midnight.      Allison L. Rennis Harding, N.P.      ______________________________  Ollen Gross. Carolynne Edouard, M.D.    ALE/MEDQ  D:  12/05/2005  T:  12/06/2005  Job:  [id number]   cc:   Deanna Artis. Sharene Skeans, M.D.  Charlcie Cradle Delford Field, MD, FCCP

## 2010-05-25 NOTE — Discharge Summary (Signed)
[name], [name]             ACCOUNT NO.:  [id number]   MEDICAL RECORD NO.:  [id number]          PATIENT TYPE:  INP   LOCATION:  3020                         FACILITY:  [facility]   PHYSICIAN:  Annie Main, N.P.      DATE OF BIRTH:  1955/05/13   DATE OF ADMISSION:  12/01/2005  DATE OF DISCHARGE:  12/26/2005                               DISCHARGE SUMMARY   DIAGNOSES AT TIME OF DISCHARGE:  1. 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  2. Vent dependent respiratory failure post stroke now with trach off      ventilator.  3. Multiple dental caries, poor dentition, plaque, and looks like      calculi.  4. Migraines.  5. Myopia.  6. Dysphagias secondary to stroke status post PEG.  7. Haemophilus influenza pneumonia treated with antibiotics.  8. Hyperglycemia.  9. Dyslipidemia. Put the patient on Zocor 200 mg a day.   MEDICINES AT TIME OF DISCHARGE:  1. Protonix 40 mg a day.  2. Klonopin 0.5 mg q.h.s.  3. Ferrous sulfate 300 mg a day.  4. Vitamin C 50 mg a day.  5. Seroquel 50 mg q.h.s.  6. Lantus 8 units q.h.s.  7. Lasix 40 mg a day.  8. Desyrel 50 mg q.h.s.  9. Aspirin 81 mg a day.  10.Lovenox 40 mg a day.  11.Subcu free water 100 mL t.i.d.  12.Jevity at 320 cc five times a day.  13.Zocor 200 mg a day.   STUDIES PERFORMED:  1. CT of the brain on admission shows no acute abnormality. CT      angiogram of the head shows a T-shaped left ICA terminus occlusion.      Contrast of the distal left M1 segment and beyond the bifurcation      compatible with collateral vascular invasion.  2. CT angiogram of the neck shows no significant proximal stenosis or      occlusion in the anterior circulation bilaterally. Left vertebral      artery dominant. Both vertebral arteries originate from the      subclavian, although the right vertebral artery origin  is difficult      to assess due to adjacent high density venous structures.  3. MRI of the brain shows left supratentorial infarct with mild mass      effect on the left lateral ventricle with minimal midline shift      towards the right by 2 mm. Small regions of hemorrhage within the      left thalamus and left cerebral peduncle.  Subarachnoid hemorrhage,      mild hydrocephalus, sinus opacification.  4. MRA of the head shows left internal carotid artery is occluded in      the supraclinoid region likely related to thrombus. No collateral      flow filling left middle cerebral artery branch vessels.      Significant tortuosity of vertebral basilar system with narrowing  of the distal right vertebral artery and proximal basilar artery.      Chest x-ray shows increasing left lower atelectasis, cardiomegaly,      and mild to moderate vascular congestion. Follow-up CT of the brain      shows no increase in intracranial hemorrhage or development of      overhydrocephalus. Evolving left MCA distribution infarct, basilar      cistern, __________  . Multiple chest x-rays showing interstitial      edema and bilateral pleural effusion.  5. Cerebral angiogram performed by Dr. Gennette Pac shows left ICA      terminal occlusion with unsuccessful clot retrieval at the terminus      to the occlusion site. Vessel perforation at the level of the      occlusion. Contrast extravasation into the paramesocephalic cistern      and ventricle. Some ventral irregularity proximal ICA at end of the      case likely represents a small contained dissection.  Multiple      abdominal x-rays verified tube placement.  6. 2-D echocardiogram shows ejection fraction 50-55% with no left      ventricular regional wall motion abnormalities. No cardiac source      of embolus.  7. EKG shows normal sinus rhythm with first degree AV block, left      atrial enlargement, ST-T wave abnormalities considered lateral       ischemia.  8. Transcranial Doppler performed, results pending.  9. EEG completed. Formal reading not in chart. There are no seizures      identified.  10.Transcranial Doppler results were low normal velocities in the      majority of vessels with anterior and posterior circulation of      unclear significance.  11.PEG tube placed on December 06, 2005 by Dr. Arlyce Dice without      incident.  12.Tracheostomy placement December 06, 2005 by Dr. Carolynne Edouard without      incident.   LABORATORY STUDIES:  BAL endotracheal aspirate culture with greater than  100,000 colonies, H. influenza urine culture was x2 no growth. Blood  culture x4 no growth. Urinalysis normal. Urine drug screen negative.  Anemia studies with iron 12, TIBC 241%, sat 5, B12 453, folate 13.2,  ferritin 329. Folate was 13.2. Cholesterol was 181, triglycerides 187,  HDL 26, LDL 118. Chemistry:  Glucose 105, BUN  31. Otherwise, liver  function tests normal, coagulation studies normal. CBC:  Hemoglobin  ranging 12-13, red blood cells 3.2 to 4.1, neutrophils 79-84,  lymphocytes 10-11; otherwise, normal.   HISTORY OF PRESENT ILLNESS:  [name] is a 55 year old African-  American male who was sitting watching TV. His mother had come in from  visiting a friend somewhere between 9:30 and 9:45. While sitting there,  he began to have paroxysms of coughing around 10 p.m. He had sudden  onset of aphasia and right-sided weakness. He was brought to the  emergency room at [facility] where a code stroke was called. The CT  showed a left middle cerebral artery sign and with no evidence of  infarction or hemorrhage. The patient was given 2/3 dose IV TPA and sent  to the angiogram suite where a MERCI retrieval device was attempted and  failed. There was perforation of the MCA with MERCI retrieval attempts  and subarachnoid hemorrhage at the site. The patient was admitted to the ICU for further workup and evaluation postprocedure.   HOSPITAL  COURSE:  The patient did have a significantly  large left MCA  infarct secondary to the terminal ICA occlusion. There was perforation  of the terminal ICA secondary to the procedure. It was felt there was  dissection at the stroke onset likely secondary from coughing. The  patient was unable to be extubated and remained on the ventilator. His  overall neurological prognosis is poor, and comfort care was discussed  with the family. However, they preferred aggressive care. The patient  eventually required PEG and trach and ordered to be removed from the  ventilator. He is on bolus tube feedings and now is off the ventilator  with trachea in place. He had elevated glucoses in the hospital and was  started on insulin and are now controlled. He also had dyslipidemia for  which Zocor was started at discharge. He developed Haemophilus influenza  pneumonia while in the hospital was treated with a round of antibiotics.  He continued elevated temperatures in hospital, but cultures other than  pneumonia remained negative. He eventually stabilized. Was off the  ventilator and was able to be transferred to the floor. [facility] was  considered but not an option secondary to payer source. Skilled nursing  facility was pursued though difficult to obtain again secondary to payer  source. Over several weeks the patient became more interactive. He is  now alert. He will follow some commands. His mother now feels she may be  able to care for him at home along with a sister who is also in the  home. Rehab evaluated him and agreed that inpatient rehab was an option  and discharge the patient there. Of note, the patient has severe oral  disease even prior to admission for this stroke having been seen in the  emergency room several times for oral pain. Our inpatient dentist was  consulted, and he needs multiple extractions and __________ plasty . At  this time he is not stable to be removed from aspirin which he is  on for  secondary stroke prevention, but may be able to do this in 2-3 months.  Will have dentist follow up with him as able. The patient will go to  rehab and follow up with Dr. __________  in 2-3 months.   CONDITION AT DISCHARGE:  The patient alert, no speech. He will  inconsistently follow commands. He is good with mimicking and gestures.  He has decreased __________  on the right but not on the left. He has  right facial weakness. His chest has coarse rhonchi at times, and he has  a productive cough with yellow sputum though the number of secretions  has decreased dramatically over the past several weeks. His heart rate  is regular. He has flaccid right hemiparesis. He moves his left side  purposefully. Sensation seems to be intact though likely not equal on  both sides. He has occasional incomprehensible speech with his Passy-  Muir valve.   DISCHARGE/PLAN: 1. Transfer to rehab for continuation of PT, OT, and speech therapy      with eventual plan for return home with family.  2. Aspirin for secondary stroke prevention.  3. Cannot stop aspirin for at least 2-3 months. At that time can      consider dental extractions.   FOLLOW UP:  1. Dentist for mouth care post rehab.  2. Follow up Dr. __________  2-3 months after rehab.      Annie Main, N.P.     SB/MEDQ  D:  12/26/2005  T:  12/26/2005  Job:  [id number]  cc:   Charlynne Pander, D.D.S.

## 2010-05-25 NOTE — H&P (Signed)
[name], [name]             ACCOUNT NO.:  [id number]   MEDICAL RECORD NO.:  [id number]          PATIENT TYPE:  INP   LOCATION:  1827                         FACILITY:  [facility]   PHYSICIAN:  Deanna Artis. Hickling, M.D.DATE OF BIRTH:  09-May-1955   DATE OF ADMISSION:  11/30/2005  DATE OF DISCHARGE:                              HISTORY & PHYSICAL   CHIEF COMPLAINT:  Cannot talk, weak right side.   HISTORY OF THE PRESENT CONDITION:  The patient was sitting watching TV.  His mother had come in from visiting a friend somewhere between 9:30 and  9:45.  While sitting there, he began to have paroxysms of coughing  around 10 p.m.  He had sudden onset of aphasia and right-sided weakness.  He was brought to the emergency room at [facility], code stroke was  called by EMS in route at 22:42.  He arrived at [facility] at 2250, CT brain  2301, I arrived at 2308, interpreted the CT scan stat which showed a  left middle cerebral artery sign, no evidence of infarction, no  hemorrhage.   PAST MEDICAL HISTORY:  Positive for:  1. Migraines.  2. Myopia.   PAST SURGICAL HISTORY:  None known.   FAMILY HISTORY:  Negative for strokes.  Father died of ruptured  appendix.  Mother is alive.  There is diabetes in the family.  Also, a  history of migraines.   MEDICINES:  None.   ALLERGIES:  None known.   SOCIAL HISTORY:  One-pack-per-day smoking since his teen years, heavy  alcohol but none in the past week, according to family, he was a dropout  in middle school.   EXAMINATION TODAY:  VITALS:  Blood pressure 174/104, resting pulse 68,  respirations 30, oxygen saturation 97%, temperature 98.2, glucose 98.  EAR/NOSE/THROAT:  No signs of infection, no bruits.  LUNGS:  Clear.  HEART:  No murmurs.  Pulses normal.  ABDOMEN:  Soft.  Bowel sounds normal.  No hepatosplenomegaly.  EXTREMITIES:  Unremarkable.  NEUROLOGIC EXAMINATION:  NIH stroke scale 16.  Mental status:  Patient  is alert, he is aphasic  globally.  Cranial nerves:  Round, reactive  pupils.  Eyes to the midline and to the left.  He has a right gaze  palsy, right homonymous hemianopsia, right central 7th.  Motor  examination:  Mild right hemiparesis with wrist drop and arm drift and  leg drift.  Motor examination on the left was normal.  Sensory  withdrawal x4.  Cerebellar, no tremor or dysmetria out of proportion to  weakness.  Gait was not tested.  Deep tendon reflexes are diminished.  Patient had bilateral flexor plantar responses.   IMPRESSION:  Left middle cerebral artery distribution clot.  He had a T-  distribution distal carotid involving also the A1 and M1 segments, no  dissection, good collateral circulation distally.   PLAN:  Angiogram for MERCI device and/or intraarterial TPA.  Dr. Alfredo Batty  has discussed this with me and the family; we have informed obtained  consent.   LABORATORY STUDIES:  PTT was 27, prothrombin time 13.1, INR 1.0.  Comprehensive metabolic:  Sodium 140,  potassium 3.7, chloride 110, CO2  of 25, glucose 98, BUN 13, creatinine 1.0, total bilirubin 0.6, alkaline  phosphatase 93, AST 23, ALT 14, total protein 7, albumin 3.6, calcium  9.5.  White blood cell count 8,300, hemoglobin 13.4, hematocrit 41.2,  MCV 92.5, platelet count 209,000, 69 polys, 25 lymphocytes, 3 monos, 2  eosinophils, 1 basal cell, absolute neutrophils 5,700.      Deanna Artis. Sharene Skeans, M.D.  Electronically Signed     [facility]/MEDQ  D:  12/01/2005  T:  12/01/2005  Job:  [id number]

## 2010-05-25 NOTE — Consult Note (Signed)
NAMEGILDO, [name]             ACCOUNT NO.:  [id number]   MEDICAL RECORD NO.:  [id number]          PATIENT TYPE:  INP   LOCATION:  1827                         FACILITY:  [facility]   PHYSICIAN:  Charlcie Cradle. Delford Field, MD, FCCPDATE OF BIRTH:  27-Dec-1955   DATE OF CONSULTATION:  12/01/2005  DATE OF DISCHARGE:                                 CONSULTATION   CHIEF COMPLAINT:  Needed for ventilator management.   HISTORY OF PRESENT ILLNESS:  This is a 55 year old African-American male  who while watching TV this evening began having paroxysms of cough and  then sudden onset of aphasia, right-sided weakness.  Code Stroke was  called, and the patient was brought into the emergency department.  The  patient had left MCA changes without infarct or hemorrhage.  He was  taken to the interventional radiology lab fairly quickly.  A clot was  attempted to be extracted from the left circulation.  It was difficult  getting the clot out, and there was some perforation near the clot.  The  patient is now on vent support, and we are asked for vent management per  neurology.   PAST MEDICAL HISTORY:  Medical history of migraines, history of myopia.   PAST SURGICAL HISTORY:  Negative.   FAMILY HISTORY:  Father died of ruptured appendix.  Mother is still  living.  Diabetes runs in the family.   MEDICATIONS PRIOR TO ADMISSION:  None.   ALLERGIES:  Not known.   SOCIAL HISTORY:  Smoker and heavy ethanol use.  Quit smoking a week ago.   PHYSICAL EXAMINATION:  Initially blood pressure 174/100, pulse 68,  respirations 30, saturation 97%, temp 98, glucose 98.  The physical  examination is limited as the patient is in the CT scanner at this time  and will be completed at a later point.   LABORATORY DATA:  The patient is on full mechanical vent support running  a tidal volume 600, rate of 15, FiO2 of 1.0, PRBC, PEEP of 0, currently  has a pulse of 58 and saturation 98%, blood pressure 160/98.  Chest x-  ray  is pending.  Other lab data currently:  Hemoglobin 13.4, white count  8.3, sodium 140, potassium 3.7, chloride 110, CO2 of 25, BUN 13,  creatinine 1.0, blood sugar 98.  The liver functions were unremarkable.  Alcohol level not detectable.  PT/PTT normal.   IMPRESSION:  Acute right-sided symptoms with left MCA stroke.  Complicating features:  Perforation of internal carotid artery with  attempts at clot removal with ventricular bleed, with acute respiratory  failure due to decreased level of consciousness induced by sedation and  stroke status.   RECOMMENDATIONS:  1. Place on full mechanical vent support.  2. Will maintain Diprivan, maintain blood pressure within prescribed      parameters per Neurology and Code Stroke order set.  3. Maintain Protonix, no Lovenox.  4. Will administer compression devices.  5. Administer nebulized albuterol.  Follow up blood gases and chest x-      ray.  6. Will follow with the critical care service.      Charlcie Cradle  Delford Field, MD, [facility]  Electronically Signed     PEW/MEDQ  D:  12/01/2005  T:  12/01/2005  Job:  [id number]   cc:   Deanna Artis. Sharene Skeans, M.D.

## 2010-05-25 NOTE — Consult Note (Signed)
NAMEEILEEN, [name]             ACCOUNT NO.:  [id number]   MEDICAL RECORD NO.:  [id number]          PATIENT TYPE:  INP   LOCATION:  3020                         FACILITY:  [facility]   PHYSICIAN:  Charlynne Pander, D.D.S.DATE OF BIRTH:  Jul 16, 1955   DATE OF CONSULTATION:  DATE OF DISCHARGE:                                 CONSULTATION   [name] is a 55 year old male who was recently admitted for an  acute stroke on November 30, 2005.  Dental consultation has been  requested to evaluate poor dentition and to rule out dental infection  which may affect the patient's systemic health.   MEDICAL HISTORY:  1. Status post acute CVA involving the left middle cerebral artery on      November 30, 2005.  Patient with right-sided weakness and      expressive aphasia.  Patient currently being followed by the stroke      team.  2. History of ventilator- dependent respiratory failure, status post      tracheostomy placed on December 06, 2005 by Dr. Carolynne Edouard.  3. History of migraines.  4. Myopia  5. History of alcoholism per chart review.  6. Status post PEG tube placement this admission.  7. Status post echocardiogram on December 01, 2005 which revealed an      ejection fraction of 50-55%, with no obvious vegetations on the      heart valves.   ALLERGIES:  None known.   MEDICATIONS:  1. Aspirin 81 mg daily.  2. Klonopin 0.5 mg at bedtime.  3. Lovenox 40 mg daily.  4. Iron sulfate 3 mg daily.  5. Lasix 40 mg daily.  6. Novolin insulin 11 units q.4 h.  7. Lantus insulin 8 units at bedtime.  8. Jevity per feeding tube protocol.  9. Protonix 40 mg daily.  10.Seroquel 50 mg at bedtime.  11.Trazodone 50 mg at bedtime.   SOCIAL HISTORY:  Patient with a history of smoking 1 pack per day since  his teens.  Patient also with a history of heavy alcohol use.   FAMILY HISTORY:  Father died from a ruptured appendix.  Mother is alive.   FUNCTIONAL ASSESSMENT:  The patient was independent for  ADLs prior to  this admission.  Patient now dependent for multiple ADLs.   REVIEW OF SYSTEMS:  This is reviewed from the chart and health history  assessment form for this admission.   DENTAL HISTORY:   CHIEF COMPLAINT:  Dental consultation requested to evaluate dental  caries.   HISTORY OF PRESENT ILLNESS:  Patient with recent acute stroke.  Patient  currently being followed by the stroke team.  Dental consultation  requested to evaluate dental caries and poor dentition.  This is to  rule out dental infection which may affect the patient's systemic  health.   The patient indicates that he has not seen a dentist for a long time.  Patient with questionable history of partial dentures.   DENTAL EXAM:  GENERAL:  The patient is well-developed, well-nourished  male in no acute distress.  VITAL SIGNS:  Blood pressure is 118/78, pulse rate is 70,  respirations  are 24, temperature is 97.1.  HEAD/NECK:  There is no palpable lymphadenopathy.  There are no acute  TMJ symptoms.  INTRAORAL:  Patient with normal saliva.  There is no evidence of abscess  formation in the mouth at this time.  The patient's tongue does have  accumulations on it consistent with a white hairy tongue.  DENTITION:  Patient with multiple missing teeth.  I would need a dental  x-ray to identify the exact tooth numbers present/missing.  PERIODONTAL:  Patient with chronic advanced periodontal disease with  plaque and calculus accumulations, gingival recession, tooth mobility,  and moderate to severe bone loss.  DENTAL CARIES:  There are multiple dental caries affecting the remaining  teeth.  ENDODONTIC:  The patient currently denies acute pulpitis symptoms.  I  would need to evaluate dental x-rays to rule out periapical pathology.  CROWN OR BRIDGE:  There are no obvious crown or bridge restorations  noted.  PROSTHODONTIC:  I am unable to ascertain whether the patient has  dentures at this time or not.  The patient  most likely will need to be  evaluated for future complete denture fabrication.  OCCLUSION:  Patient with a poor occlusal scheme secondary to multiple  missing teeth, supereruption and drifting of the unopposed teeth into  the edentulous areas, and lack of replacement of the missing teeth with  dental prostheses.   RADIOGRAPHIC INTERPRETATION:  Patient unable to stand for a panoramic x-  ray at this time.  Hopefully, this will be done in the future during  this admission.   ASSESSMENT:  1. History of oral neglect.  2. Multiple missing teeth.  3. Chronic advanced periodontal disease.  4. Plaque and calculus accumulations.  5. Gingival recession.  6. Tooth mobility.  7. Multiple retained root segments.  8. Dental caries.  9. Supereruption and drifting of the unopposed teeth into the      edentulous areas.  10.No history of acute pulpitis symptoms at this time by patient      report.  11.Questionable history of the presence of partial dentures at this      time.  12.Current Lovenox therapy with the risk for bleeding with invasive      dental procedures.  13.Recent acute stroke, with significant risk for complications      associated with invasive dental procedures at this time.   PLAN/RECOMMENDATIONS:  1. Discussed the risks, benefits, and complications of various      treatment options with the patient in relationship to his medical      and dental conditions and current Lovenox therapy and recent      stroke.  We discussed various treatment options to include no      treatment, total and subtotal extractions with alveoloplasty,      periodontal therapy, dental restorations, crown or bridge therapy,      root canal therapy, implant therapy, and replacing the missing      teeth as indicated after adequate healing.  We also discussed      preprosthetic surgery as indicated.  Will need to determine when      the patient will be able to have the Lovenox or anticoagulant      therapy discontinued to allow for dental extractions with      alveoloplasty and preprosthetic surgery.  Will also need to      determine when the patient will be able to stand to allow a      panoramic x-ray to  be taken.  Once this is determined, we will      proceed most likely with full-mouth extractions with alveoloplasty      and preprosthetic surgery as indicated.  2. I will discuss with the stroke team prior to finalizing the date of      anticipated procedures. This most likely will take place as an      outpatient due to the acute nature of the stroke the patient has      suffered and the need to remain on antiplatelet/anticoagulant      therapy.      Charlynne Pander, D.D.S.  Electronically Signed     RFK/MEDQ  D:  12/26/2005  T:  12/26/2005  Job:  [id number]   cc:   Pramod P. Pearlean Brownie, MD

## 2010-06-02 ENCOUNTER — Inpatient Hospital Stay (INDEPENDENT_AMBULATORY_CARE_PROVIDER_SITE_OTHER)
Admission: RE | Admit: 2010-06-02 | Discharge: 2010-06-02 | Disposition: A | Discharge status: Home / Self Care | Payer: Medicaid Other | Source: Ambulatory Visit | Attending: Emergency Medicine | Admitting: Emergency Medicine

## 2010-06-02 DIAGNOSIS — M79609 Pain in unspecified limb: Secondary | ICD-10-CM

## 2010-06-02 LAB — GLUCOSE, CAPILLARY: Glucose-Capillary: 79 mg/dL (ref 70–99)

## 2011-03-12 ENCOUNTER — Emergency Department (INDEPENDENT_AMBULATORY_CARE_PROVIDER_SITE_OTHER)
Admission: EM | Admit: 2011-03-12 | Discharge: 2011-03-12 | Disposition: A | Discharge status: Home / Self Care | Payer: Medicaid Other | Source: Home / Self Care | Attending: Family Medicine | Admitting: Family Medicine

## 2011-03-12 ENCOUNTER — Encounter (HOSPITAL_COMMUNITY): Payer: Self-pay | Admitting: Emergency Medicine

## 2011-03-12 DIAGNOSIS — J310 Chronic rhinitis: Secondary | ICD-10-CM

## 2011-03-12 DIAGNOSIS — H6980 Other specified disorders of Eustachian tube, unspecified ear: Secondary | ICD-10-CM

## 2011-03-12 HISTORY — DX: Essential (primary) hypertension: I10

## 2011-03-12 HISTORY — DX: Cerebral infarction, unspecified: I63.9

## 2011-03-12 MED ORDER — FLUTICASONE PROPIONATE 50 MCG/ACT NA SUSP: 2.0000 | Freq: Every day | NASAL | Status: DC

## 2011-03-12 NOTE — ED Provider Notes (Signed)
History     CSN: [id number]  Arrival date & time 03/12/11  1154   First MD Initiated Contact with Patient 03/12/11 1304      Chief Complaint  Patient presents with  . Otalgia    (Consider location/radiation/quality/duration/timing/severity/associated sxs/prior treatment) HPI Comments: [name] presents for evaluation of left ear pain x2 weeks. His mother is presenting most of the history. He has some thought and speech deficits from a stroke in 2007. Mother also reports some nasal congestion, and rhinorrhea. They both deny any fever.  Patient is a 56 y.o. male presenting with ear pain. The history is provided by the patient and a parent.  Otalgia This is a new problem. The current episode started more than 1 week ago. There is pain in the left ear. The problem occurs constantly. The problem has not changed since onset.There has been no fever. The pain is mild. Associated symptoms include hearing loss and rhinorrhea. Pertinent negatives include no ear discharge.    Past Medical History  Diagnosis Date  . Diabetes mellitus   . Hypertension   . Stroke     Past Surgical History  Procedure Date  . Tracheostomy     History reviewed. No pertinent family history.  History  Substance Use Topics  . Smoking status: Never Smoker   . Smokeless tobacco: Not on file  . Alcohol Use: No      Review of Systems  Constitutional: Negative.   HENT: Positive for hearing loss, ear pain, congestion and rhinorrhea. Negative for ear discharge.   Eyes: Negative.   Respiratory: Negative.   Cardiovascular: Negative.   Gastrointestinal: Negative.   Genitourinary: Negative.   Musculoskeletal: Negative.   Skin: Negative.   Neurological: Negative.     Allergies  Review of patient's allergies indicates no known allergies.  Home Medications   Current Outpatient Rx  Name Route Sig Dispense Refill  . FISH OIL OIL Does not apply by Does not apply route.    Marland Kitchen GLIPIZIDE PO Oral Take by mouth.      Marland Kitchen LORATADINE 10 MG PO TABS Oral Take 10 mg by mouth daily.    Marland Kitchen METFORMIN HCL PO Oral Take by mouth.    . ONE-DAILY MULTI VITAMINS PO TABS Oral Take 1 tablet by mouth daily.    Marland Kitchen SIMVASTATIN PO Oral Take by mouth.    Marland Kitchen FLUTICASONE PROPIONATE 50 MCG/ACT NA SUSP Nasal Place 2 sprays into the nose daily. 16 g 2    BP 162/77  Pulse 64  Temp(Src) 98.5 F (36.9 C) (Oral)  Resp 20  SpO2 99%  Physical Exam  Nursing note and vitals reviewed. Constitutional: He is oriented to person, place, and time. He appears well-developed and well-nourished.  HENT:  Head: Normocephalic and atraumatic.  Right Ear: Tympanic membrane is retracted.  Left Ear: Tympanic membrane is retracted.  Eyes: EOM are normal.  Neck: Normal range of motion.  Pulmonary/Chest: Effort normal.  Musculoskeletal: Normal range of motion.  Neurological: He is alert and oriented to person, place, and time.  Skin: Skin is warm and dry.  Psychiatric: His behavior is normal.    ED Course  Procedures (including critical care time)  Labs Reviewed - No data to display No results found.   1. Rhinitis   2. Eustachian tube dysfunction       MDM  Given rx for fluticasone        Richardo Priest, MD 03/12/11 1501

## 2011-03-12 NOTE — Discharge Instructions (Signed)
Use nasal spray as directed and continue your loratadine daily for the next 1 - 2 weeks. Return to care should your symptoms not improve, or worsen in any way

## 2011-03-12 NOTE — ED Notes (Signed)
C/o left ear pain.  Onset 2 weeks ago.  Mother reports a slight runny nose

## 2012-07-30 ENCOUNTER — Encounter (HOSPITAL_COMMUNITY): Payer: Self-pay | Admitting: Emergency Medicine

## 2012-07-30 ENCOUNTER — Emergency Department (HOSPITAL_COMMUNITY)
Admission: EM | Admit: 2012-07-30 | Discharge: 2012-07-30 | Disposition: A | Discharge status: Home / Self Care | Payer: Medicaid Other | Source: Home / Self Care

## 2012-07-30 DIAGNOSIS — R05 Cough: Secondary | ICD-10-CM

## 2012-07-30 DIAGNOSIS — J309 Allergic rhinitis, unspecified: Secondary | ICD-10-CM

## 2012-07-30 MED ORDER — FLUTICASONE PROPIONATE 50 MCG/ACT NA SUSP: 2.0000 | Freq: Every day | NASAL | Status: DC

## 2012-07-30 MED ORDER — PHENYLEPHRINE-CHLORPHEN-DM 10-4-12.5 MG/5ML PO LIQD: 5.0000 mL | ORAL | Status: DC | PRN

## 2012-07-30 NOTE — ED Provider Notes (Signed)
Medical screening examination/treatment/procedure(s) were performed by resident physician or non-physician practitioner and as supervising physician I was immediately available for consultation/collaboration.   KINDL,JAMES DOUGLAS MD.   James D Kindl, MD 07/30/12 1742 

## 2012-07-30 NOTE — ED Provider Notes (Signed)
History    CSN: [id number] Arrival date & time 07/30/12  1210  None    No chief complaint on file.  (Consider location/radiation/quality/duration/timing/severity/associated sxs/prior Treatment) HPI Comments: 57 year old male is brought in by his wife with a complaint of cough for 2 weeks. 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 He is able to manage his secretions and maintaining airway control. He is also positive for sore throat. Denies earache, fever, chills or shortness of breath.  Past Medical History  Diagnosis Date  . Diabetes mellitus   . Hypertension   . Stroke    Past Surgical History  Procedure Laterality Date  . Tracheostomy     No family history on file. History  Substance Use Topics  . Smoking status: Never Smoker   . Smokeless tobacco: Not on file  . Alcohol Use: No    Review of Systems  Constitutional: Negative for fever, diaphoresis, activity change and fatigue.  HENT: Positive for sore throat and postnasal drip. Negative for ear pain, facial swelling, rhinorrhea, trouble swallowing, neck pain and neck stiffness.   Eyes: Negative for pain, discharge and redness.  Respiratory: Positive for cough. Negative for chest tightness, shortness of breath and wheezing.   Cardiovascular: Negative.   Gastrointestinal: Negative.   Neurological: Negative.     Allergies  Review of patient's allergies indicates no known allergies.  Home Medications   Current Outpatient Rx  Name  Route  Sig  Dispense  Refill  . OVER THE COUNTER MEDICATION      Over the counter cough medicine         . Fish Oil OIL   Does not apply   by Does not apply route.         Marland Kitchen EXPIRED: fluticasone (FLONASE) 50 MCG/ACT nasal spray   Nasal   Place 2 sprays into the nose daily.   16 g   2   . GLIPIZIDE PO   Oral   Take by mouth.         . loratadine (CLARITIN) 10 MG tablet   Oral   Take 10 mg by mouth daily.         Marland Kitchen METFORMIN HCL  PO   Oral   Take by mouth.         . Multiple Vitamin (MULTIVITAMIN) tablet   Oral   Take 1 tablet by mouth daily.         Marland Kitchen SIMVASTATIN PO   Oral   Take by mouth.          BP 151/93  Pulse 84  Temp(Src) 98.3 F (36.8 C) (Oral)  Resp 20  SpO2 100% Physical Exam  Nursing note and vitals reviewed. Constitutional: He appears well-developed and well-nourished. No distress.  HENT:  Right Ear: External ear normal.  Left Ear: External ear normal.  Oropharynx with minor erythema and clear PND. No exudates, swelling. Bilateral TMs are normal  Eyes: Conjunctivae and EOM are normal.  Neck: Normal range of motion. Neck supple.  Cardiovascular: Normal rate and normal heart sounds.   Pulmonary/Chest: Effort normal and breath sounds normal. No respiratory distress. He has no wheezes. He has no rales.  Good expansion. Forced expirations reveal no prolonged phase or wheezes.  Neurological: He is alert.  Skin: Skin is warm and dry. No rash noted.  Psychiatric: He has a normal mood and affect.    ED Course  Procedures (including critical care time) Labs Reviewed - No  data to display No results found. 1. Allergic rhinitis   2. Cough     MDM  Norell CS 1 teaspoon every 4 hours when necessary cough and congestion. They have been made aware of sedation precautions. Fluticasone nasal spray as directed Drink plenty of fluids stay well hydrated For any new symptoms palms worsening may return otherwise followup with your PCP.  Hayden Rasmussen, NP 07/30/12 1339  Hayden Rasmussen, NP 07/30/12 [phone]

## 2012-07-30 NOTE — ED Notes (Addendum)
Reports uri for 2 weeks.  Patient has had cough, sore throat, runny nose and yellow phlegm, no fever.

## 2012-08-06 ENCOUNTER — Emergency Department (HOSPITAL_COMMUNITY)
Admission: EM | Admit: 2012-08-06 | Discharge: 2012-08-06 | Disposition: A | Discharge status: Home / Self Care | Payer: Medicaid Other | Attending: Emergency Medicine | Admitting: Emergency Medicine

## 2012-08-06 ENCOUNTER — Emergency Department (HOSPITAL_COMMUNITY): Payer: Medicaid Other

## 2012-08-06 ENCOUNTER — Encounter (HOSPITAL_COMMUNITY): Payer: Self-pay | Admitting: Emergency Medicine

## 2012-08-06 DIAGNOSIS — I10 Essential (primary) hypertension: Secondary | ICD-10-CM | POA: Insufficient documentation

## 2012-08-06 DIAGNOSIS — R059 Cough, unspecified: Secondary | ICD-10-CM | POA: Insufficient documentation

## 2012-08-06 DIAGNOSIS — J029 Acute pharyngitis, unspecified: Secondary | ICD-10-CM | POA: Insufficient documentation

## 2012-08-06 DIAGNOSIS — E119 Type 2 diabetes mellitus without complications: Secondary | ICD-10-CM | POA: Insufficient documentation

## 2012-08-06 DIAGNOSIS — H109 Unspecified conjunctivitis: Secondary | ICD-10-CM | POA: Insufficient documentation

## 2012-08-06 DIAGNOSIS — Z8673 Personal history of transient ischemic attack (TIA), and cerebral infarction without residual deficits: Secondary | ICD-10-CM | POA: Insufficient documentation

## 2012-08-06 DIAGNOSIS — R05 Cough: Secondary | ICD-10-CM | POA: Insufficient documentation

## 2012-08-06 DIAGNOSIS — Z79899 Other long term (current) drug therapy: Secondary | ICD-10-CM | POA: Insufficient documentation

## 2012-08-06 DIAGNOSIS — J069 Acute upper respiratory infection, unspecified: Secondary | ICD-10-CM | POA: Insufficient documentation

## 2012-08-06 LAB — RAPID STREP SCREEN (MED CTR MEBANE ONLY): Streptococcus, Group A Screen (Direct): NEGATIVE

## 2012-08-06 MED ORDER — AZITHROMYCIN 250 MG PO TABS: ORAL_TABLET | ORAL | Status: DC

## 2012-08-06 MED ORDER — POLYMYXIN B-TRIMETHOPRIM 10000-0.1 UNIT/ML-% OP SOLN: 1.0000 [drp] | OPHTHALMIC | Status: DC

## 2012-08-06 NOTE — ED Notes (Signed)
Patient transported to X-ray by Evangeline Gula

## 2012-08-06 NOTE — ED Provider Notes (Signed)
CSN: [id number]     Arrival date & time 08/06/12  1224 History     First MD Initiated Contact with Patient 08/06/12 1231     Chief Complaint  Patient presents with  . URI  . Conjunctivitis  . Nasal Congestion   (Consider location/radiation/quality/duration/timing/severity/associated sxs/prior Treatment) HPI Comments: Patient with history of diabetes, stroke, hypertension presents with complaint of upper respiratory tract infection symptoms including sore throat and cough for the past 3 weeks. Patient was seen at urgent care 1 week ago and was prescribed Flonase nasal spray and cough syrup. This has not been helping his cough. Patient has not had fever. He complains of runny nose. He is also had a red right eye, nonpainful, for the past several days. No neck pain, nausea, vomiting. Right-sided weakness at baseline. No new medications. Patient does not take ACE inhibitor.  Patient is a 57 y.o. male presenting with URI and conjunctivitis. The history is provided by the patient, medical records and a relative.  URI Presenting symptoms: congestion, cough, rhinorrhea and sore throat   Presenting symptoms: no ear pain, no fatigue and no fever   Associated symptoms: no headaches, no myalgias and no wheezing   Conjunctivitis Associated symptoms include congestion, coughing and a sore throat. Pertinent negatives include no abdominal pain, chills, fatigue, fever, headaches, myalgias, nausea, rash or vomiting.    Past Medical History  Diagnosis Date  . Diabetes mellitus   . Hypertension   . Stroke    Past Surgical History  Procedure Laterality Date  . Tracheostomy     History reviewed. No pertinent family history. History  Substance Use Topics  . Smoking status: Never Smoker   . Smokeless tobacco: Not on file  . Alcohol Use: No    Review of Systems  Constitutional: Negative for fever, chills and fatigue.  HENT: Positive for congestion, sore throat and rhinorrhea. Negative for ear  pain, neck stiffness and sinus pressure.   Eyes: Negative for redness.  Respiratory: Positive for cough. Negative for wheezing.   Gastrointestinal: Negative for nausea, vomiting, abdominal pain and diarrhea.  Genitourinary: Negative for dysuria.  Musculoskeletal: Negative for myalgias.  Skin: Negative for rash.  Neurological: Negative for headaches.  Hematological: Negative for adenopathy.    Allergies  Review of patient's allergies indicates no known allergies.  Home Medications   Current Outpatient Rx  Name  Route  Sig  Dispense  Refill  . Fish Oil OIL   Does not apply   by Does not apply route.         Marland Kitchen EXPIRED: fluticasone (FLONASE) 50 MCG/ACT nasal spray   Nasal   Place 2 sprays into the nose daily.   16 g   2   . fluticasone (FLONASE) 50 MCG/ACT nasal spray   Nasal   Place 2 sprays into the nose daily.   16 g   0   . GLIPIZIDE PO   Oral   Take by mouth.         . loratadine (CLARITIN) 10 MG tablet   Oral   Take 10 mg by mouth daily.         Marland Kitchen METFORMIN HCL PO   Oral   Take by mouth.         . Multiple Vitamin (MULTIVITAMIN) tablet   Oral   Take 1 tablet by mouth daily.         Marland Kitchen OVER THE COUNTER MEDICATION      Over the counter cough medicine         .  Phenylephrine-Chlorphen-DM 10-11-10.5 MG/5ML LIQD   Oral   Take 5 mLs by mouth every 4 (four) hours as needed.   120 mL   0   . SIMVASTATIN PO   Oral   Take by mouth.          BP 143/84  Pulse 85  Temp(Src) 98 F (36.7 C) (Oral)  Resp 16  SpO2 99% Physical Exam  Nursing note and vitals reviewed. Constitutional: He appears well-developed and well-nourished.  HENT:  Head: Normocephalic and atraumatic. No trismus in the jaw.  Right Ear: Tympanic membrane, external ear and ear canal normal.  Left Ear: Tympanic membrane, external ear and ear canal normal.  Nose: Nose normal. No mucosal edema or rhinorrhea.  Mouth/Throat: Uvula is midline and mucous membranes are normal.  Mucous membranes are not dry. No dental abscesses or edematous. Posterior oropharyngeal erythema present. No oropharyngeal exudate, posterior oropharyngeal edema or tonsillar abscesses.  Eyes: EOM are normal. Pupils are equal, round, and reactive to light. Right eye exhibits no discharge. Left eye exhibits no discharge.  Conjunctival injection of the right eye. No drainage or crusting. Full range of motion without pain. No periorbital edema.  Neck: Normal range of motion. Neck supple.  Cardiovascular: Normal rate, regular rhythm and normal heart sounds.   Pulmonary/Chest: Effort normal and breath sounds normal. No respiratory distress. He has no wheezes. He has no rales.  Abdominal: Soft. There is no tenderness.  Neurological: He is alert.  Skin: Skin is warm and dry.  Psychiatric: He has a normal mood and affect.    ED Course   Procedures (including critical care time)  Labs Reviewed  RAPID STREP SCREEN  CULTURE, GROUP A STREP   Dg Chest 2 View  08/06/2012   *RADIOLOGY REPORT*  Clinical Data: Upper respiratory infection, conjunctivitis  CHEST - 2 VIEW  Comparison: 12/26/2005  Findings: Cardiomediastinal silhouette is stable.  No acute infiltrate or pleural effusion.  No pulmonary edema.  Previous tracheostomy tube has been removed.  Minimal degenerative changes thoracic spine.  IMPRESSION: No active disease.   Original Report Authenticated By: Natasha Mead, M.D.   1. Cough   2. Upper respiratory tract infection     Patient seen and examined. Work-up initiated.    Vital signs reviewed and are as follows: Filed Vitals:   08/06/12 1235  BP: 143/84  Pulse: 85  Temp: 98 F (36.7 C)  Resp: 16   Chest x-ray and strep tests were negative. Patient and wife informed. Counseled on treatment.  Counseled on use of antibiotic ointment medication.  Patient urged to return with worsening symptoms or other concerns. Patient verbalized understanding and agrees with plan.   MDM  Patient with  chronic cough, upper respiratory tract infection symptoms. Given duration of symptoms, greater than 3 weeks, feel trial of antibiotics is warranted at this point.   For conjunctivitis: Antibiotic ointment, Polytrim. No eye pain or concern for acute closed angle glaucoma.  Renne Crigler, PA-C 08/06/12 [phone]

## 2012-08-06 NOTE — ED Notes (Signed)
Patient has been sick for 3 weeks with cold sx, coughing and nasal congestion.  Patient also has sore throat.  Patient has redness to the right eye.  Patient was seen at [facility] 07-24 and given prescriptions for flonase and cough medication.  Sx continue despite medications.  Patient with no reported fever.  No reported swelling.  He denies any sob.  Patient sugars have been running 98 or close to that per the wife.

## 2012-08-06 NOTE — ED Notes (Signed)
Pt c/o URI with cough and red eyes with nasal congestion x 3 weeks; pt denies fever; pt with hx of stroke with right sided deficits

## 2012-08-06 NOTE — ED Notes (Signed)
Patient and friend verbalized understanding of instructions.  Encouraged to return as needed.

## 2012-08-06 NOTE — ED Notes (Signed)
Patient has hx of cva, eats regular diet.  Denies any coughing/choking when eating

## 2012-08-07 NOTE — ED Provider Notes (Signed)
Medical screening examination/treatment/procedure(s) were performed by non-physician practitioner and as supervising physician I was immediately available for consultation/collaboration.  Sarahi Borland K Linker, MD 08/07/12 1513 

## 2012-08-08 LAB — CULTURE, GROUP A STREP

## 2012-12-14 ENCOUNTER — Encounter: Payer: Self-pay | Admitting: Podiatry

## 2012-12-14 ENCOUNTER — Ambulatory Visit (INDEPENDENT_AMBULATORY_CARE_PROVIDER_SITE_OTHER): Payer: Medicaid Other | Admitting: Podiatry

## 2012-12-14 VITALS — BP 144/94 | HR 90 | Resp 12

## 2012-12-14 DIAGNOSIS — B351 Tinea unguium: Secondary | ICD-10-CM

## 2012-12-14 DIAGNOSIS — M79609 Pain in unspecified limb: Secondary | ICD-10-CM

## 2012-12-14 NOTE — Progress Notes (Signed)
Patient ID: [name], male   DOB: 09/15/1955, 57 y.o.   MRN: [id number]  Subjective : This 57-year-old black diabetic male presents for ongoing skin and nail debridement at approximately three-month intervals. He has been a patient in this practice since 2008.   Objective: Hypertrophic, elongated, discolored toenails x10 with palpable tenderness in all nail plates. 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 Reappoint at three-month intervals  

## 2013-03-08 ENCOUNTER — Encounter: Payer: Self-pay | Admitting: Podiatry

## 2013-03-08 ENCOUNTER — Ambulatory Visit (INDEPENDENT_AMBULATORY_CARE_PROVIDER_SITE_OTHER): Payer: Medicaid Other | Admitting: Podiatry

## 2013-03-08 VITALS — BP 159/101 | HR 82 | Resp 18

## 2013-03-08 DIAGNOSIS — L84 Corns and callosities: Secondary | ICD-10-CM

## 2013-03-08 DIAGNOSIS — B351 Tinea unguium: Secondary | ICD-10-CM

## 2013-03-08 DIAGNOSIS — M79609 Pain in unspecified limb: Secondary | ICD-10-CM

## 2013-03-08 NOTE — Patient Instructions (Signed)
Diabetes and Foot Care Diabetes may cause you to have problems because of poor blood supply (circulation) to your feet and legs. This may cause the skin on your feet to become thinner, break easier, and heal more slowly. Your skin may become dry, and the skin may peel and crack. You may also have nerve damage in your legs and feet causing decreased feeling in them. You may not notice minor injuries to your feet that could lead to infections or more serious problems. Taking care of your feet is one of the most important things you can do for yourself.  HOME CARE INSTRUCTIONS  Wear shoes at all times, even in the house. Do not go barefoot. Bare feet are easily injured.  Check your feet daily for blisters, cuts, and redness. If you cannot see the bottom of your feet, use a mirror or ask someone for help.  Wash your feet with warm water (do not use hot water) and mild soap. Then pat your feet and the areas between your toes until they are completely dry. Do not soak your feet as this can dry your skin.  Apply a moisturizing lotion or petroleum jelly (that does not contain alcohol and is unscented) to the skin on your feet and to dry, brittle toenails. Do not apply lotion between your toes.  Trim your toenails straight across. Do not dig under them or around the cuticle. File the edges of your nails with an emery board or nail file.  Do not cut corns or calluses or try to remove them with medicine.  Wear clean socks or stockings every day. Make sure they are not too tight. Do not wear knee-high stockings since they may decrease blood flow to your legs.  Wear shoes that fit properly and have enough cushioning. To break in new shoes, wear them for just a few hours a day. This prevents you from injuring your feet. Always look in your shoes before you put them on to be sure there are no objects inside.  Do not cross your legs. This may decrease the blood flow to your feet.  If you find a minor scrape,  cut, or break in the skin on your feet, keep it and the skin around it clean and dry. These areas may be cleansed with mild soap and water. Do not cleanse the area with peroxide, alcohol, or iodine.  When you remove an adhesive bandage, be sure not to damage the skin around it.  If you have a wound, look at it several times a day to make sure it is healing.  Do not use heating pads or hot water bottles. They may burn your skin. If you have lost feeling in your feet or legs, you may not know it is happening until it is too late.  Make sure your health care provider performs a complete foot exam at least annually or more often if you have foot problems. Report any cuts, sores, or bruises to your health care provider immediately. SEEK MEDICAL CARE IF:   You have an injury that is not healing.  You have cuts or breaks in the skin.  You have an ingrown nail.  You notice redness on your legs or feet.  You feel burning or tingling in your legs or feet.  You have pain or cramps in your legs and feet.  Your legs or feet are numb.  Your feet always feel cold. SEEK IMMEDIATE MEDICAL CARE IF:   There is increasing redness,   swelling, or pain in or around a wound.  There is a red line that goes up your leg.  Pus is coming from a wound.  You develop a fever or as directed by your health care provider.  You notice a bad smell coming from an ulcer or wound. Document Released: 12/22/1999 Document Revised: 08/26/2012 Document Reviewed: 06/02/2012 [employer] Patient Information 2014 [employer]  

## 2013-03-09 NOTE — Progress Notes (Signed)
Patient ID: [name], male   DOB: [birth year], 58 y.o.   MRN: [id number]  Subjective : This 58 year old black diabetic male presents for ongoing skin and nail debridement at approximately three-month intervals. He has been a patient in this practice since 2008.   Objective: Hypertrophic, elongated, discolored toenails x10 with palpable tenderness in all nail plates. 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 Reappoint at three-month intervals

## 2013-07-12 ENCOUNTER — Ambulatory Visit (INDEPENDENT_AMBULATORY_CARE_PROVIDER_SITE_OTHER): Payer: Medicaid Other | Admitting: Podiatry

## 2013-07-12 ENCOUNTER — Encounter: Payer: Self-pay | Admitting: Podiatry

## 2013-07-12 VITALS — BP 128/79 | HR 70 | Resp 18

## 2013-07-12 DIAGNOSIS — M79673 Pain in unspecified foot: Secondary | ICD-10-CM

## 2013-07-12 DIAGNOSIS — B351 Tinea unguium: Secondary | ICD-10-CM

## 2013-07-12 DIAGNOSIS — L84 Corns and callosities: Secondary | ICD-10-CM

## 2013-07-12 DIAGNOSIS — M79609 Pain in unspecified limb: Secondary | ICD-10-CM

## 2013-07-12 NOTE — Progress Notes (Signed)
Patient ID: [name], male   DOB: 02/06/1955, 58 y.o.   MRN: [id number]  Subjective: Orientated x3 diabetic black male presents for ongoing debridement of skin and nails. He is complaining of uncomfortable toenails and a keratoses.  Objective: Elongated, hypertrophic, discolored toenails x10 Keratoses fifth right toe  Assessment: Symptomatic onychomycoses 6-10 Keratoses x1  Plan: Nails x10 and keratoses x1 debrided without any bleeding  Reappoint at three-month intervals

## 2013-10-18 ENCOUNTER — Ambulatory Visit (INDEPENDENT_AMBULATORY_CARE_PROVIDER_SITE_OTHER): Payer: Medicaid Other | Admitting: Podiatry

## 2013-10-18 DIAGNOSIS — L84 Corns and callosities: Secondary | ICD-10-CM

## 2013-10-18 DIAGNOSIS — M79676 Pain in unspecified toe(s): Secondary | ICD-10-CM

## 2013-10-18 DIAGNOSIS — B351 Tinea unguium: Secondary | ICD-10-CM

## 2013-10-18 NOTE — Progress Notes (Signed)
   Subjective:    Patient ID: [name], male    DOB: Sep 17, 1955, 58 y.o.   MRN: [id number]  HPI  Patient presents complaining of painful toenails and keratoses on the fifth right toe  Review of Systems  All other systems reviewed and are negative.      Objective:   Physical Exam  The toenails are elongated, hypertrophic, discolored 6-10 Keratoses fifth right toe      Assessment & Plan:   Assessment: Symptomatic onychomycoses 6-10 Keratoses x1  Plan: Nails x10 and keratoses x1 debrided without a bleeding  Reappoint x3 months

## 2014-01-17 ENCOUNTER — Encounter: Payer: Self-pay | Admitting: Podiatry

## 2014-01-17 ENCOUNTER — Ambulatory Visit (INDEPENDENT_AMBULATORY_CARE_PROVIDER_SITE_OTHER): Payer: Medicaid Other | Admitting: Podiatry

## 2014-01-17 DIAGNOSIS — L84 Corns and callosities: Secondary | ICD-10-CM

## 2014-01-17 DIAGNOSIS — B351 Tinea unguium: Secondary | ICD-10-CM

## 2014-01-17 DIAGNOSIS — M79676 Pain in unspecified toe(s): Secondary | ICD-10-CM

## 2014-01-17 NOTE — Patient Instructions (Signed)
Diabetes and Foot Care Diabetes may cause you to have problems because of poor blood supply (circulation) to your feet and legs. This may cause the skin on your feet to become thinner, break easier, and heal more slowly. Your skin may become dry, and the skin may peel and crack. You may also have nerve damage in your legs and feet causing decreased feeling in them. You may not notice minor injuries to your feet that could lead to infections or more serious problems. Taking care of your feet is one of the most important things you can do for yourself.  HOME CARE INSTRUCTIONS  Wear shoes at all times, even in the house. Do not go barefoot. Bare feet are easily injured.  Check your feet daily for blisters, cuts, and redness. If you cannot see the bottom of your feet, use a mirror or ask someone for help.  Wash your feet with warm water (do not use hot water) and mild soap. Then pat your feet and the areas between your toes until they are completely dry. Do not soak your feet as this can dry your skin.  Apply a moisturizing lotion or petroleum jelly (that does not contain alcohol and is unscented) to the skin on your feet and to dry, brittle toenails. Do not apply lotion between your toes.  Trim your toenails straight across. Do not dig under them or around the cuticle. File the edges of your nails with an emery board or nail file.  Do not cut corns or calluses or try to remove them with medicine.  Wear clean socks or stockings every day. Make sure they are not too tight. Do not wear knee-high stockings since they may decrease blood flow to your legs.  Wear shoes that fit properly and have enough cushioning. To break in new shoes, wear them for just a few hours a day. This prevents you from injuring your feet. Always look in your shoes before you put them on to be sure there are no objects inside.  Do not cross your legs. This may decrease the blood flow to your feet.  If you find a minor scrape,  cut, or break in the skin on your feet, keep it and the skin around it clean and dry. These areas may be cleansed with mild soap and water. Do not cleanse the area with peroxide, alcohol, or iodine.  When you remove an adhesive bandage, be sure not to damage the skin around it.  If you have a wound, look at it several times a day to make sure it is healing.  Do not use heating pads or hot water bottles. They may burn your skin. If you have lost feeling in your feet or legs, you may not know it is happening until it is too late.  Make sure your health care provider performs a complete foot exam at least annually or more often if you have foot problems. Report any cuts, sores, or bruises to your health care provider immediately. SEEK MEDICAL CARE IF:   You have an injury that is not healing.  You have cuts or breaks in the skin.  You have an ingrown nail.  You notice redness on your legs or feet.  You feel burning or tingling in your legs or feet.  You have pain or cramps in your legs and feet.  Your legs or feet are numb.  Your feet always feel cold. SEEK IMMEDIATE MEDICAL CARE IF:   There is increasing redness,   swelling, or pain in or around a wound.  There is a red line that goes up your leg.  Pus is coming from a wound.  You develop a fever or as directed by your health care provider.  You notice a bad smell coming from an ulcer or wound. Document Released: 12/22/1999 Document Revised: 08/26/2012 Document Reviewed: 06/02/2012 [employer] Patient Information 2015 [employer]. This information is not intended to replace advice given to you by your health care provider. Make sure you discuss any questions you have with your health care provider.  

## 2014-01-17 NOTE — Progress Notes (Signed)
Patient ID: [name], male   DOB: 01-Nov-1955, 59 y.o.   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

## 2014-04-11 ENCOUNTER — Ambulatory Visit (INDEPENDENT_AMBULATORY_CARE_PROVIDER_SITE_OTHER): Payer: Medicaid Other | Admitting: Podiatry

## 2014-04-11 ENCOUNTER — Encounter: Payer: Self-pay | Admitting: Podiatry

## 2014-04-11 DIAGNOSIS — Q828 Other specified congenital malformations of skin: Secondary | ICD-10-CM

## 2014-04-11 DIAGNOSIS — M79676 Pain in unspecified toe(s): Secondary | ICD-10-CM

## 2014-04-11 DIAGNOSIS — E119 Type 2 diabetes mellitus without complications: Secondary | ICD-10-CM

## 2014-04-11 DIAGNOSIS — B351 Tinea unguium: Secondary | ICD-10-CM | POA: Diagnosis not present

## 2014-04-11 DIAGNOSIS — L84 Corns and callosities: Secondary | ICD-10-CM

## 2014-04-11 NOTE — Patient Instructions (Signed)
Diabetes and Foot Care Diabetes may cause you to have problems because of poor blood supply (circulation) to your feet and legs. This may cause the skin on your feet to become thinner, break easier, and heal more slowly. Your skin may become dry, and the skin may peel and crack. You may also have nerve damage in your legs and feet causing decreased feeling in them. You may not notice minor injuries to your feet that could lead to infections or more serious problems. Taking care of your feet is one of the most important things you can do for yourself.  HOME CARE INSTRUCTIONS  Wear shoes at all times, even in the house. Do not go barefoot. Bare feet are easily injured.  Check your feet daily for blisters, cuts, and redness. If you cannot see the bottom of your feet, use a mirror or ask someone for help.  Wash your feet with warm water (do not use hot water) and mild soap. Then pat your feet and the areas between your toes until they are completely dry. Do not soak your feet as this can dry your skin.  Apply a moisturizing lotion or petroleum jelly (that does not contain alcohol and is unscented) to the skin on your feet and to dry, brittle toenails. Do not apply lotion between your toes.  Trim your toenails straight across. Do not dig under them or around the cuticle. File the edges of your nails with an emery board or nail file.  Do not cut corns or calluses or try to remove them with medicine.  Wear clean socks or stockings every day. Make sure they are not too tight. Do not wear knee-high stockings since they may decrease blood flow to your legs.  Wear shoes that fit properly and have enough cushioning. To break in new shoes, wear them for just a few hours a day. This prevents you from injuring your feet. Always look in your shoes before you put them on to be sure there are no objects inside.  Do not cross your legs. This may decrease the blood flow to your feet.  If you find a minor scrape,  cut, or break in the skin on your feet, keep it and the skin around it clean and dry. These areas may be cleansed with mild soap and water. Do not cleanse the area with peroxide, alcohol, or iodine.  When you remove an adhesive bandage, be sure not to damage the skin around it.  If you have a wound, look at it several times a day to make sure it is healing.  Do not use heating pads or hot water bottles. They may burn your skin. If you have lost feeling in your feet or legs, you may not know it is happening until it is too late.  Make sure your health care provider performs a complete foot exam at least annually or more often if you have foot problems. Report any cuts, sores, or bruises to your health care provider immediately. SEEK MEDICAL CARE IF:   You have an injury that is not healing.  You have cuts or breaks in the skin.  You have an ingrown nail.  You notice redness on your legs or feet.  You feel burning or tingling in your legs or feet.  You have pain or cramps in your legs and feet.  Your legs or feet are numb.  Your feet always feel cold. SEEK IMMEDIATE MEDICAL CARE IF:   There is increasing redness,   swelling, or pain in or around a wound.  There is a red line that goes up your leg.  Pus is coming from a wound.  You develop a fever or as directed by your health care provider.  You notice a bad smell coming from an ulcer or wound. Document Released: 12/22/1999 Document Revised: 08/26/2012 Document Reviewed: 06/02/2012 [employer] Patient Information 2015 [employer]. This information is not intended to replace advice given to you by your health care provider. Make sure you discuss any questions you have with your health care provider.  

## 2014-04-12 NOTE — Progress Notes (Signed)
Patient ID: [name], male   DOB: 1955-12-24, 59 y.o.   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

## 2014-07-18 ENCOUNTER — Ambulatory Visit: Payer: Medicaid Other | Admitting: Podiatry

## 2014-07-20 ENCOUNTER — Ambulatory Visit: Payer: Medicaid Other | Admitting: Podiatry

## 2014-07-26 ENCOUNTER — Ambulatory Visit (INDEPENDENT_AMBULATORY_CARE_PROVIDER_SITE_OTHER): Payer: Medicaid Other | Admitting: Podiatry

## 2014-07-26 ENCOUNTER — Encounter: Payer: Self-pay | Admitting: Podiatry

## 2014-07-26 DIAGNOSIS — L84 Corns and callosities: Secondary | ICD-10-CM

## 2014-07-26 DIAGNOSIS — Q828 Other specified congenital malformations of skin: Secondary | ICD-10-CM

## 2014-07-26 DIAGNOSIS — M79674 Pain in right toe(s): Secondary | ICD-10-CM

## 2014-07-26 DIAGNOSIS — B351 Tinea unguium: Secondary | ICD-10-CM | POA: Diagnosis not present

## 2014-07-26 DIAGNOSIS — M79676 Pain in unspecified toe(s): Secondary | ICD-10-CM

## 2014-07-26 DIAGNOSIS — E119 Type 2 diabetes mellitus without complications: Secondary | ICD-10-CM

## 2014-07-26 NOTE — Patient Instructions (Signed)
Diabetes and Foot Care Diabetes may cause you to have problems because of poor blood supply (circulation) to your feet and legs. This may cause the skin on your feet to become thinner, break easier, and heal more slowly. Your skin may become dry, and the skin may peel and crack. You may also have nerve damage in your legs and feet causing decreased feeling in them. You may not notice minor injuries to your feet that could lead to infections or more serious problems. Taking care of your feet is one of the most important things you can do for yourself.  HOME CARE INSTRUCTIONS  Wear shoes at all times, even in the house. Do not go barefoot. Bare feet are easily injured.  Check your feet daily for blisters, cuts, and redness. If you cannot see the bottom of your feet, use a mirror or ask someone for help.  Wash your feet with warm water (do not use hot water) and mild soap. Then pat your feet and the areas between your toes until they are completely dry. Do not soak your feet as this can dry your skin.  Apply a moisturizing lotion or petroleum jelly (that does not contain alcohol and is unscented) to the skin on your feet and to dry, brittle toenails. Do not apply lotion between your toes.  Trim your toenails straight across. Do not dig under them or around the cuticle. File the edges of your nails with an emery board or nail file.  Do not cut corns or calluses or try to remove them with medicine.  Wear clean socks or stockings every day. Make sure they are not too tight. Do not wear knee-high stockings since they may decrease blood flow to your legs.  Wear shoes that fit properly and have enough cushioning. To break in new shoes, wear them for just a few hours a day. This prevents you from injuring your feet. Always look in your shoes before you put them on to be sure there are no objects inside.  Do not cross your legs. This may decrease the blood flow to your feet.  If you find a minor scrape,  cut, or break in the skin on your feet, keep it and the skin around it clean and dry. These areas may be cleansed with mild soap and water. Do not cleanse the area with peroxide, alcohol, or iodine.  When you remove an adhesive bandage, be sure not to damage the skin around it.  If you have a wound, look at it several times a day to make sure it is healing.  Do not use heating pads or hot water bottles. They may burn your skin. If you have lost feeling in your feet or legs, you may not know it is happening until it is too late.  Make sure your health care provider performs a complete foot exam at least annually or more often if you have foot problems. Report any cuts, sores, or bruises to your health care provider immediately. SEEK MEDICAL CARE IF:   You have an injury that is not healing.  You have cuts or breaks in the skin.  You have an ingrown nail.  You notice redness on your legs or feet.  You feel burning or tingling in your legs or feet.  You have pain or cramps in your legs and feet.  Your legs or feet are numb.  Your feet always feel cold. SEEK IMMEDIATE MEDICAL CARE IF:   There is increasing redness,   swelling, or pain in or around a wound.  There is a red line that goes up your leg.  Pus is coming from a wound.  You develop a fever or as directed by your health care provider.  You notice a bad smell coming from an ulcer or wound. Document Released: 12/22/1999 Document Revised: 08/26/2012 Document Reviewed: 06/02/2012 [employer] Patient Information 2015 [employer]. This information is not intended to replace advice given to you by your health care provider. Make sure you discuss any questions you have with your health care provider.  

## 2014-07-26 NOTE — Progress Notes (Signed)
Patient ID: [name], male   DOB: 01/24/1955, 59 y.o.   MRN: [id number]  Subjective: This patient presents for scheduled visit complaining of painful toenails and a a full corn and requests skin a nail debridement.  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

## 2014-10-26 ENCOUNTER — Encounter: Payer: Self-pay | Admitting: Podiatry

## 2014-10-26 ENCOUNTER — Ambulatory Visit (INDEPENDENT_AMBULATORY_CARE_PROVIDER_SITE_OTHER): Payer: Medicaid Other | Admitting: Podiatry

## 2014-10-26 DIAGNOSIS — B351 Tinea unguium: Secondary | ICD-10-CM

## 2014-10-26 DIAGNOSIS — L84 Corns and callosities: Secondary | ICD-10-CM

## 2014-10-26 DIAGNOSIS — E119 Type 2 diabetes mellitus without complications: Secondary | ICD-10-CM | POA: Diagnosis not present

## 2014-10-26 DIAGNOSIS — M79676 Pain in unspecified toe(s): Secondary | ICD-10-CM | POA: Diagnosis not present

## 2014-10-26 NOTE — Patient Instructions (Signed)
Diabetes and Foot Care Diabetes may cause you to have problems because of poor blood supply (circulation) to your feet and legs. This may cause the skin on your feet to become thinner, break easier, and heal more slowly. Your skin may become dry, and the skin may peel and crack. You may also have nerve damage in your legs and feet causing decreased feeling in them. You may not notice minor injuries to your feet that could lead to infections or more serious problems. Taking care of your feet is one of the most important things you can do for yourself.  HOME CARE INSTRUCTIONS  Wear shoes at all times, even in the house. Do not go barefoot. Bare feet are easily injured.  Check your feet daily for blisters, cuts, and redness. If you cannot see the bottom of your feet, use a mirror or ask someone for help.  Wash your feet with warm water (do not use hot water) and mild soap. Then pat your feet and the areas between your toes until they are completely dry. Do not soak your feet as this can dry your skin.  Apply a moisturizing lotion or petroleum jelly (that does not contain alcohol and is unscented) to the skin on your feet and to dry, brittle toenails. Do not apply lotion between your toes.  Trim your toenails straight across. Do not dig under them or around the cuticle. File the edges of your nails with an emery board or nail file.  Do not cut corns or calluses or try to remove them with medicine.  Wear clean socks or stockings every day. Make sure they are not too tight. Do not wear knee-high stockings since they may decrease blood flow to your legs.  Wear shoes that fit properly and have enough cushioning. To break in new shoes, wear them for just a few hours a day. This prevents you from injuring your feet. Always look in your shoes before you put them on to be sure there are no objects inside.  Do not cross your legs. This may decrease the blood flow to your feet.  If you find a minor scrape,  cut, or break in the skin on your feet, keep it and the skin around it clean and dry. These areas may be cleansed with mild soap and water. Do not cleanse the area with peroxide, alcohol, or iodine.  When you remove an adhesive bandage, be sure not to damage the skin around it.  If you have a wound, look at it several times a day to make sure it is healing.  Do not use heating pads or hot water bottles. They may burn your skin. If you have lost feeling in your feet or legs, you may not know it is happening until it is too late.  Make sure your health care provider performs a complete foot exam at least annually or more often if you have foot problems. Report any cuts, sores, or bruises to your health care provider immediately. SEEK MEDICAL CARE IF:   You have an injury that is not healing.  You have cuts or breaks in the skin.  You have an ingrown nail.  You notice redness on your legs or feet.  You feel burning or tingling in your legs or feet.  You have pain or cramps in your legs and feet.  Your legs or feet are numb.  Your feet always feel cold. SEEK IMMEDIATE MEDICAL CARE IF:   There is increasing redness,   swelling, or pain in or around a wound.  There is a red line that goes up your leg.  Pus is coming from a wound.  You develop a fever or as directed by your health care provider.  You notice a bad smell coming from an ulcer or wound.   This information is not intended to replace advice given to you by your health care provider. Make sure you discuss any questions you have with your health care provider.   Document Released: 12/22/1999 Document Revised: 08/26/2012 Document Reviewed: 06/02/2012 Elsevier Interactive Patient Education 2016 [employer]  

## 2014-10-26 NOTE — Progress Notes (Signed)
Patient ID: [name], male   DOB: 1955-08-27, 59 y.o.   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

## 2015-02-08 ENCOUNTER — Ambulatory Visit (INDEPENDENT_AMBULATORY_CARE_PROVIDER_SITE_OTHER): Payer: Medicaid Other | Admitting: Podiatry

## 2015-02-08 ENCOUNTER — Encounter: Payer: Self-pay | Admitting: Podiatry

## 2015-02-08 DIAGNOSIS — M79676 Pain in unspecified toe(s): Secondary | ICD-10-CM | POA: Diagnosis not present

## 2015-02-08 DIAGNOSIS — B351 Tinea unguium: Secondary | ICD-10-CM

## 2015-02-08 NOTE — Patient Instructions (Signed)
Diabetes and Foot Care Diabetes may cause you to have problems because of poor blood supply (circulation) to your feet and legs. This may cause the skin on your feet to become thinner, break easier, and heal more slowly. Your skin may become dry, and the skin may peel and crack. You may also have nerve damage in your legs and feet causing decreased feeling in them. You may not notice minor injuries to your feet that could lead to infections or more serious problems. Taking care of your feet is one of the most important things you can do for yourself.  HOME CARE INSTRUCTIONS  Wear shoes at all times, even in the house. Do not go barefoot. Bare feet are easily injured.  Check your feet daily for blisters, cuts, and redness. If you cannot see the bottom of your feet, use a mirror or ask someone for help.  Wash your feet with warm water (do not use hot water) and mild soap. Then pat your feet and the areas between your toes until they are completely dry. Do not soak your feet as this can dry your skin.  Apply a moisturizing lotion or petroleum jelly (that does not contain alcohol and is unscented) to the skin on your feet and to dry, brittle toenails. Do not apply lotion between your toes.  Trim your toenails straight across. Do not dig under them or around the cuticle. File the edges of your nails with an emery board or nail file.  Do not cut corns or calluses or try to remove them with medicine.  Wear clean socks or stockings every day. Make sure they are not too tight. Do not wear knee-high stockings since they may decrease blood flow to your legs.  Wear shoes that fit properly and have enough cushioning. To break in new shoes, wear them for just a few hours a day. This prevents you from injuring your feet. Always look in your shoes before you put them on to be sure there are no objects inside.  Do not cross your legs. This may decrease the blood flow to your feet.  If you find a minor scrape,  cut, or break in the skin on your feet, keep it and the skin around it clean and dry. These areas may be cleansed with mild soap and water. Do not cleanse the area with peroxide, alcohol, or iodine.  When you remove an adhesive bandage, be sure not to damage the skin around it.  If you have a wound, look at it several times a day to make sure it is healing.  Do not use heating pads or hot water bottles. They may burn your skin. If you have lost feeling in your feet or legs, you may not know it is happening until it is too late.  Make sure your health care provider performs a complete foot exam at least annually or more often if you have foot problems. Report any cuts, sores, or bruises to your health care provider immediately. SEEK MEDICAL CARE IF:   You have an injury that is not healing.  You have cuts or breaks in the skin.  You have an ingrown nail.  You notice redness on your legs or feet.  You feel burning or tingling in your legs or feet.  You have pain or cramps in your legs and feet.  Your legs or feet are numb.  Your feet always feel cold. SEEK IMMEDIATE MEDICAL CARE IF:   There is increasing redness,   swelling, or pain in or around a wound.  There is a red line that goes up your leg.  Pus is coming from a wound.  You develop a fever or as directed by your health care provider.  You notice a bad smell coming from an ulcer or wound.   This information is not intended to replace advice given to you by your health care provider. Make sure you discuss any questions you have with your health care provider.   Document Released: 12/22/1999 Document Revised: 08/26/2012 Document Reviewed: 06/02/2012 Elsevier Interactive Patient Education 2016 [employer]  

## 2015-02-08 NOTE — Progress Notes (Signed)
Patient ID: [name], male   DOB: 1955/04/03, 60 y.o.   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

## 2015-05-10 ENCOUNTER — Ambulatory Visit (INDEPENDENT_AMBULATORY_CARE_PROVIDER_SITE_OTHER): Payer: Medicaid Other | Admitting: Podiatry

## 2015-05-10 ENCOUNTER — Encounter: Payer: Self-pay | Admitting: Podiatry

## 2015-05-10 DIAGNOSIS — M79676 Pain in unspecified toe(s): Secondary | ICD-10-CM | POA: Diagnosis not present

## 2015-05-10 DIAGNOSIS — B351 Tinea unguium: Secondary | ICD-10-CM | POA: Diagnosis not present

## 2015-05-10 NOTE — Patient Instructions (Signed)
Diabetes and Foot Care Diabetes may cause you to have problems because of poor blood supply (circulation) to your feet and legs. This may cause the skin on your feet to become thinner, break easier, and heal more slowly. Your skin may become dry, and the skin may peel and crack. You may also have nerve damage in your legs and feet causing decreased feeling in them. You may not notice minor injuries to your feet that could lead to infections or more serious problems. Taking care of your feet is one of the most important things you can do for yourself.  HOME CARE INSTRUCTIONS  Wear shoes at all times, even in the house. Do not go barefoot. Bare feet are easily injured.  Check your feet daily for blisters, cuts, and redness. If you cannot see the bottom of your feet, use a mirror or ask someone for help.  Wash your feet with warm water (do not use hot water) and mild soap. Then pat your feet and the areas between your toes until they are completely dry. Do not soak your feet as this can dry your skin.  Apply a moisturizing lotion or petroleum jelly (that does not contain alcohol and is unscented) to the skin on your feet and to dry, brittle toenails. Do not apply lotion between your toes.  Trim your toenails straight across. Do not dig under them or around the cuticle. File the edges of your nails with an emery board or nail file.  Do not cut corns or calluses or try to remove them with medicine.  Wear clean socks or stockings every day. Make sure they are not too tight. Do not wear knee-high stockings since they may decrease blood flow to your legs.  Wear shoes that fit properly and have enough cushioning. To break in new shoes, wear them for just a few hours a day. This prevents you from injuring your feet. Always look in your shoes before you put them on to be sure there are no objects inside.  Do not cross your legs. This may decrease the blood flow to your feet.  If you find a minor scrape,  cut, or break in the skin on your feet, keep it and the skin around it clean and dry. These areas may be cleansed with mild soap and water. Do not cleanse the area with peroxide, alcohol, or iodine.  When you remove an adhesive bandage, be sure not to damage the skin around it.  If you have a wound, look at it several times a day to make sure it is healing.  Do not use heating pads or hot water bottles. They may burn your skin. If you have lost feeling in your feet or legs, you may not know it is happening until it is too late.  Make sure your health care provider performs a complete foot exam at least annually or more often if you have foot problems. Report any cuts, sores, or bruises to your health care provider immediately. SEEK MEDICAL CARE IF:   You have an injury that is not healing.  You have cuts or breaks in the skin.  You have an ingrown nail.  You notice redness on your legs or feet.  You feel burning or tingling in your legs or feet.  You have pain or cramps in your legs and feet.  Your legs or feet are numb.  Your feet always feel cold. SEEK IMMEDIATE MEDICAL CARE IF:   There is increasing redness,   swelling, or pain in or around a wound.  There is a red line that goes up your leg.  Pus is coming from a wound.  You develop a fever or as directed by your health care provider.  You notice a bad smell coming from an ulcer or wound.   This information is not intended to replace advice given to you by your health care provider. Make sure you discuss any questions you have with your health care provider.   Document Released: 12/22/1999 Document Revised: 08/26/2012 Document Reviewed: 06/02/2012 Elsevier Interactive Patient Education 2016 [employer]  

## 2015-05-11 NOTE — Progress Notes (Signed)
Patient ID: [name], male   DOB: 26-Feb-1955, 60 y.o.   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

## 2015-08-16 ENCOUNTER — Ambulatory Visit: Payer: Medicaid Other | Admitting: Podiatry

## 2016-01-30 ENCOUNTER — Encounter: Payer: Self-pay | Admitting: Podiatry

## 2016-01-30 ENCOUNTER — Ambulatory Visit (INDEPENDENT_AMBULATORY_CARE_PROVIDER_SITE_OTHER): Payer: Medicaid Other | Admitting: Podiatry

## 2016-01-30 VITALS — BP 154/93 | HR 82 | Resp 14

## 2016-01-30 DIAGNOSIS — M79676 Pain in unspecified toe(s): Secondary | ICD-10-CM | POA: Diagnosis not present

## 2016-01-30 DIAGNOSIS — B351 Tinea unguium: Secondary | ICD-10-CM | POA: Diagnosis not present

## 2016-01-30 NOTE — Progress Notes (Signed)
Patient ID: [name], male   DOB: 08/28/1955, 61 y.o.   MRN: [id number]    Subjective: This patient presents for scheduled visit complaining that his toenails are thick and elongated and uncomfortable when walking and wearing shoes and is requesting toenail debridement.The patient did not present for scheduled visit of 3 months after the visit of 05/10/2015  Objective: DP and PT pulses 2/4 bilaterally Capillary reflex immediate bilaterally Sensation to 10 g monofilament wire intact 0/5 bilaterally Vibratory sensation nonreactive right reactive left Ankle reflex reactive bilaterally No open skin lesions bilaterally The toenails are hypertrophic, elongated, deformed, discolored and tender drug palpation 6-10 HAV bilaterally Hammertoe fifth bilaterally Dorsi flexion 0/5 right and 5/5 left Plantar flexion 5/5 bilaterally  Assessment: 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 

## 2016-01-30 NOTE — Patient Instructions (Signed)

## 2016-02-13 ENCOUNTER — Ambulatory Visit (HOSPITAL_COMMUNITY)
Admission: EM | Admit: 2016-02-13 | Discharge: 2016-02-13 | Disposition: A | Discharge status: Home / Self Care | Payer: Medicaid Other | Attending: Family Medicine | Admitting: Family Medicine

## 2016-02-13 DIAGNOSIS — M79605 Pain in left leg: Secondary | ICD-10-CM | POA: Diagnosis not present

## 2016-02-13 MED ORDER — MELOXICAM 15 MG PO TABS: 15.0000 mg | ORAL_TABLET | Freq: Every day | ORAL | 0 refills | Status: DC

## 2016-02-13 NOTE — Discharge Instructions (Signed)
Based on your symptoms and physical exam, you most likely have a muscle strain. I advise rest, ice, elevate the affected extremity, wrap with ace bandages or appropriately sized split for support and compression. For ice, apply 15 minutes at a time up to 4 times daily, you may also alternate with heat if you wish. For medication, I have prescribed an antiinflammatory called Mobic, take 1 tablet daily.  Should your symptoms persist, I recommend you follow up with your primary care provider, an orthopedic doctor, or return to clinic as needed.

## 2016-02-13 NOTE — ED Provider Notes (Signed)
CSN: [id number]     Arrival date & time 02/13/16  1503 History   None    Chief Complaint  Patient presents with  . Leg Pain   (Consider location/radiation/quality/duration/timing/severity/associated sxs/prior Treatment) 61 year old male presents to clinic with left leg pain. Pain started today, denies any trauma, he has not fallen, denies any twisting, he does not have any swelling, he does report he has walked relatively long distances, denies any fever or systemic complaints. States his pain is throughout his entire leg rather than in one particular spot   The history is provided by the patient and a relative.  Leg Pain    Past Medical History:  Diagnosis Date  . Diabetes mellitus   . Hypertension   . Stroke [facility])    Past Surgical History:  Procedure Laterality Date  . TRACHEOSTOMY     No family history on file. Social History  Substance Use Topics  . Smoking status: Never Smoker  . Smokeless tobacco: Never Used  . Alcohol use No    Review of Systems  Reason unable to perform ROS: as covered in HPI.  All other systems reviewed and are negative.   Allergies  Patient has no known allergies.  Home Medications   Prior to Admission medications   Medication Sig Start Date End Date Taking? Authorizing Provider  acetaminophen (TYLENOL) 500 MG tablet Take 1,000 mg by mouth every 6 (six) hours as needed for pain.    Historical Provider, MD  fluticasone (FLONASE) 50 MCG/ACT nasal spray Place 2 sprays into the nose daily. 07/30/12   Hayden Rasmussen, NP  glipiZIDE (GLUCOTROL) 10 MG tablet Take 10 mg by mouth daily.    Historical Provider, MD  loratadine (CLARITIN) 10 MG tablet Take 10 mg by mouth daily.    Historical Provider, MD  meloxicam (MOBIC) 15 MG tablet Take 1 tablet (15 mg total) by mouth daily. 02/13/16   Dorena Bodo, NP  metFORMIN (GLUCOPHAGE) 1000 MG tablet Take 1,000 mg by mouth 2 (two) times daily.    Historical Provider, MD  Multiple Vitamin (MULTIVITAMIN WITH  MINERALS) TABS Take 1 tablet by mouth daily.    Historical Provider, MD  omega-3 acid ethyl esters (LOVAZA) 1 G capsule Take 1 g by mouth daily.    Historical Provider, MD  simvastatin (ZOCOR) 40 MG tablet Take 40 mg by mouth daily.    Historical Provider, MD  trimethoprim-polymyxin b (POLYTRIM) ophthalmic solution Place 1 drop into the right eye every 4 (four) hours. 08/06/12   Renne Crigler, PA-C   Meds Ordered and Administered this Visit  Medications - No data to display  BP 121/72 (BP Location: Left Arm)   Pulse 80   Temp 98.2 F (36.8 C) (Oral)   Resp 16   SpO2 98%  No data found.   Physical Exam  Constitutional: He is oriented to person, place, and time. He appears well-developed and well-nourished. No distress.  HENT:  Head: Normocephalic and atraumatic.  Neck: Normal range of motion.  Cardiovascular: Normal rate and regular rhythm.   Pulmonary/Chest: Effort normal and breath sounds normal.  Musculoskeletal:       Left knee: He exhibits normal range of motion, no swelling, no effusion, no ecchymosis, no deformity, no laceration, no erythema and normal patellar mobility. No tenderness found. No medial joint line, no lateral joint line, no MCL, no LCL and no patellar tendon tenderness noted.  No swelling, edema, redness, pain, crepitus, or other abnormal findings in his leg  Neurological:  He is alert and oriented to person, place, and time.  Skin: Skin is warm and dry. Capillary refill takes less than 2 seconds. He is not diaphoretic.  Psychiatric: He has a normal mood and affect.  Nursing note and vitals reviewed.   Urgent Care Course     Procedures (including critical care time)  Labs Review Labs Reviewed - No data to display  Imaging Review No results found.   Visual Acuity Review  Right Eye Distance:   Left Eye Distance:   Bilateral Distance:    Right Eye Near:   Left Eye Near:    Bilateral Near:         MDM   1. Left leg pain   Based on your  symptoms and physical exam, you most likely have a muscle strain. I advise rest, ice, elevate the affected extremity, wrap with ace bandages or appropriately sized split for support and compression. For ice, apply 15 minutes at a time up to 4 times daily, you may also alternate with heat if you wish. For medication, I have prescribed an antiinflammatory called Mobic, take 1 tablet daily.  Should your symptoms persist, I recommend you follow up with your primary care provider, an orthopedic doctor, or return to clinic as needed.       Dorena Bodo, NP 02/13/16 [phone]

## 2016-02-13 NOTE — ED Triage Notes (Signed)
C/o left leg pain

## 2016-04-30 ENCOUNTER — Ambulatory Visit (INDEPENDENT_AMBULATORY_CARE_PROVIDER_SITE_OTHER): Payer: Medicaid Other | Admitting: Podiatry

## 2016-04-30 DIAGNOSIS — G629 Polyneuropathy, unspecified: Secondary | ICD-10-CM

## 2016-04-30 DIAGNOSIS — L84 Corns and callosities: Secondary | ICD-10-CM

## 2016-04-30 DIAGNOSIS — M79676 Pain in unspecified toe(s): Secondary | ICD-10-CM | POA: Diagnosis not present

## 2016-04-30 DIAGNOSIS — B351 Tinea unguium: Secondary | ICD-10-CM | POA: Diagnosis not present

## 2016-04-30 NOTE — Progress Notes (Signed)
Patient ID: [name], male   DOB: 09/09/1955, 60 y.o.   MRN: [id number]    Subjective: This patient presents for scheduled visit complaining that his toenails are thick and elongated and uncomfortable when walking and wearing shoes and is requesting toenail debridement.The patient did not present for scheduled visit of 3 months after the visit of 05/10/2015  Objective: DP and PT pulses 2/4 bilaterally Capillary reflex immediate bilaterally Sensation to 10 g monofilament wire intact 0/5 bilaterally Vibratory sensation nonreactive right reactive left Ankle reflex reactive bilaterally No open skin lesions bilaterally The toenails are hypertrophic, elongated, deformed, discolored and tender drug palpation 6-10 HAV bilaterally Hammertoe fifth bilaterally Dorsi flexion 0/5 right and 5/5 left Plantar flexion 5/5 bilaterally  Assessment: 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 

## 2016-04-30 NOTE — Patient Instructions (Signed)

## 2016-07-31 ENCOUNTER — Ambulatory Visit (INDEPENDENT_AMBULATORY_CARE_PROVIDER_SITE_OTHER): Payer: Medicaid Other | Admitting: Podiatry

## 2016-07-31 ENCOUNTER — Encounter: Payer: Self-pay | Admitting: Podiatry

## 2016-07-31 DIAGNOSIS — M79676 Pain in unspecified toe(s): Secondary | ICD-10-CM

## 2016-07-31 DIAGNOSIS — L84 Corns and callosities: Secondary | ICD-10-CM

## 2016-07-31 DIAGNOSIS — G629 Polyneuropathy, unspecified: Secondary | ICD-10-CM

## 2016-07-31 DIAGNOSIS — B351 Tinea unguium: Secondary | ICD-10-CM | POA: Diagnosis not present

## 2016-07-31 NOTE — Progress Notes (Signed)
Patient ID: [name], male   DOB: [birth year], 62 y.o.   MRN: [id number]    Subjective: This patient presents for scheduled visit complaining that his toenails are thick and elongated and uncomfortable when walking and wearing shoes and is requesting toenail debridement.The patient did not present for scheduled visit of 3 months after the visit of 05/10/2015  Objective: DP and PT pulses 2/4 bilaterally Capillary reflex immediate bilaterally Sensation to 10 g monofilament wire intact 0/5 bilaterally Vibratory sensation nonreactive right reactive left Ankle reflex reactive bilaterally No open skin lesions bilaterally The toenails are hypertrophic, elongated, deformed, discolored and tender drug palpation 6-10 HAV bilaterally Hammertoe fifth bilaterally Dorsi flexion 0/5 right and 5/5 left Plantar flexion 5/5 bilaterally  Assessment: 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

## 2016-07-31 NOTE — Patient Instructions (Signed)

## 2016-08-07 DIAGNOSIS — I428 Other cardiomyopathies: Secondary | ICD-10-CM

## 2016-08-07 HISTORY — DX: Other cardiomyopathies: I42.8

## 2016-09-04 ENCOUNTER — Inpatient Hospital Stay (HOSPITAL_COMMUNITY)
Admission: EM | Admit: 2016-09-04 | Discharge: 2016-09-09 | DRG: 286 | Disposition: A | Discharge status: Home / Self Care | Payer: Medicaid Other | Attending: Interventional Cardiology | Admitting: Interventional Cardiology

## 2016-09-04 ENCOUNTER — Encounter (HOSPITAL_COMMUNITY): Payer: Self-pay

## 2016-09-04 ENCOUNTER — Emergency Department (HOSPITAL_COMMUNITY): Payer: Medicaid Other

## 2016-09-04 DIAGNOSIS — I2510 Atherosclerotic heart disease of native coronary artery without angina pectoris: Secondary | ICD-10-CM | POA: Diagnosis present

## 2016-09-04 DIAGNOSIS — E7800 Pure hypercholesterolemia, unspecified: Secondary | ICD-10-CM | POA: Diagnosis not present

## 2016-09-04 DIAGNOSIS — T461X5A Adverse effect of calcium-channel blockers, initial encounter: Secondary | ICD-10-CM | POA: Diagnosis not present

## 2016-09-04 DIAGNOSIS — I483 Typical atrial flutter: Secondary | ICD-10-CM | POA: Diagnosis not present

## 2016-09-04 DIAGNOSIS — I110 Hypertensive heart disease with heart failure: Secondary | ICD-10-CM | POA: Diagnosis present

## 2016-09-04 DIAGNOSIS — I429 Cardiomyopathy, unspecified: Secondary | ICD-10-CM | POA: Diagnosis present

## 2016-09-04 DIAGNOSIS — I517 Cardiomegaly: Secondary | ICD-10-CM

## 2016-09-04 DIAGNOSIS — Y92238 Other place in hospital as the place of occurrence of the external cause: Secondary | ICD-10-CM | POA: Diagnosis not present

## 2016-09-04 DIAGNOSIS — E785 Hyperlipidemia, unspecified: Secondary | ICD-10-CM | POA: Diagnosis present

## 2016-09-04 DIAGNOSIS — Z79899 Other long term (current) drug therapy: Secondary | ICD-10-CM | POA: Diagnosis not present

## 2016-09-04 DIAGNOSIS — Z791 Long term (current) use of non-steroidal anti-inflammatories (NSAID): Secondary | ICD-10-CM

## 2016-09-04 DIAGNOSIS — I69351 Hemiplegia and hemiparesis following cerebral infarction affecting right dominant side: Secondary | ICD-10-CM | POA: Diagnosis not present

## 2016-09-04 DIAGNOSIS — J9811 Atelectasis: Secondary | ICD-10-CM | POA: Diagnosis present

## 2016-09-04 DIAGNOSIS — R001 Bradycardia, unspecified: Secondary | ICD-10-CM | POA: Diagnosis not present

## 2016-09-04 DIAGNOSIS — I5023 Acute on chronic systolic (congestive) heart failure: Secondary | ICD-10-CM | POA: Diagnosis present

## 2016-09-04 DIAGNOSIS — R7989 Other specified abnormal findings of blood chemistry: Secondary | ICD-10-CM | POA: Diagnosis present

## 2016-09-04 DIAGNOSIS — Z794 Long term (current) use of insulin: Secondary | ICD-10-CM | POA: Diagnosis not present

## 2016-09-04 DIAGNOSIS — I4892 Unspecified atrial flutter: Secondary | ICD-10-CM | POA: Diagnosis not present

## 2016-09-04 DIAGNOSIS — I428 Other cardiomyopathies: Secondary | ICD-10-CM | POA: Diagnosis not present

## 2016-09-04 DIAGNOSIS — I10 Essential (primary) hypertension: Secondary | ICD-10-CM

## 2016-09-04 DIAGNOSIS — I4891 Unspecified atrial fibrillation: Secondary | ICD-10-CM | POA: Diagnosis present

## 2016-09-04 DIAGNOSIS — Z8673 Personal history of transient ischemic attack (TIA), and cerebral infarction without residual deficits: Secondary | ICD-10-CM

## 2016-09-04 DIAGNOSIS — I69320 Aphasia following cerebral infarction: Secondary | ICD-10-CM | POA: Diagnosis not present

## 2016-09-04 DIAGNOSIS — E1140 Type 2 diabetes mellitus with diabetic neuropathy, unspecified: Secondary | ICD-10-CM

## 2016-09-04 DIAGNOSIS — I361 Nonrheumatic tricuspid (valve) insufficiency: Secondary | ICD-10-CM | POA: Diagnosis not present

## 2016-09-04 DIAGNOSIS — R748 Abnormal levels of other serum enzymes: Secondary | ICD-10-CM | POA: Diagnosis not present

## 2016-09-04 DIAGNOSIS — E119 Type 2 diabetes mellitus without complications: Secondary | ICD-10-CM | POA: Diagnosis not present

## 2016-09-04 DIAGNOSIS — R0609 Other forms of dyspnea: Secondary | ICD-10-CM | POA: Diagnosis not present

## 2016-09-04 DIAGNOSIS — I509 Heart failure, unspecified: Secondary | ICD-10-CM | POA: Diagnosis not present

## 2016-09-04 DIAGNOSIS — I5021 Acute systolic (congestive) heart failure: Secondary | ICD-10-CM | POA: Diagnosis present

## 2016-09-04 DIAGNOSIS — I484 Atypical atrial flutter: Secondary | ICD-10-CM | POA: Diagnosis not present

## 2016-09-04 LAB — CBC
HEMATOCRIT: 43.5 % (ref 39.0–52.0)
HEMOGLOBIN: 14.2 g/dL (ref 13.0–17.0)
MCH: 28.8 pg (ref 26.0–34.0)
MCHC: 32.6 g/dL (ref 30.0–36.0)
MCV: 88.2 fL (ref 78.0–100.0)
Platelets: 235 10*3/uL (ref 150–400)
RBC: 4.93 MIL/uL (ref 4.22–5.81)
RDW: 15.1 % (ref 11.5–15.5)
WBC: 8.6 10*3/uL (ref 4.0–10.5)

## 2016-09-04 LAB — BASIC METABOLIC PANEL
ANION GAP: 10 (ref 5–15)
BUN: 13 mg/dL (ref 6–20)
CALCIUM: 9.5 mg/dL (ref 8.9–10.3)
CO2: 22 mmol/L (ref 22–32)
Chloride: 107 mmol/L (ref 101–111)
Creatinine, Ser: 1.33 mg/dL — ABNORMAL HIGH (ref 0.61–1.24)
GFR, EST NON AFRICAN AMERICAN: 57 mL/min — AB (ref >60)
Glucose, Bld: 142 mg/dL — ABNORMAL HIGH (ref 65–99)
POTASSIUM: 4.3 mmol/L (ref 3.5–5.1)
Sodium: 139 mmol/L (ref 135–145)

## 2016-09-04 LAB — I-STAT TROPONIN, ED: TROPONIN I, POC: 0.13 ng/mL — AB (ref 0.00–0.08)

## 2016-09-04 LAB — BRAIN NATRIURETIC PEPTIDE: B NATRIURETIC PEPTIDE 5: 696.7 pg/mL — AB (ref 0.0–100.0)

## 2016-09-04 LAB — MAGNESIUM: MAGNESIUM: 1.9 mg/dL (ref 1.7–2.4)

## 2016-09-04 MED ORDER — FUROSEMIDE 10 MG/ML IJ SOLN
20.0000 mg | Freq: Once | INTRAMUSCULAR | Status: AC
  Administered 2016-09-04: 20 mg via INTRAVENOUS
  Filled 2016-09-04: qty 2

## 2016-09-04 MED ORDER — DILTIAZEM HCL 100 MG IV SOLR
5.0000 mg/h | INTRAVENOUS | Status: DC
  Administered 2016-09-04: 5 mg/h via INTRAVENOUS
  Filled 2016-09-04: qty 100

## 2016-09-04 MED ORDER — DILTIAZEM LOAD VIA INFUSION
10.0000 mg | Freq: Once | INTRAVENOUS | Status: AC
  Administered 2016-09-04: 10 mg via INTRAVENOUS
  Filled 2016-09-04: qty 10

## 2016-09-04 MED ORDER — HEPARIN (PORCINE) IN NACL 100-0.45 UNIT/ML-% IJ SOLN
1250.0000 [IU]/h | INTRAMUSCULAR | Status: DC
  Administered 2016-09-04: 1250 [IU]/h via INTRAVENOUS
  Filled 2016-09-04: qty 250

## 2016-09-04 MED ORDER — HEPARIN BOLUS VIA INFUSION
5000.0000 [IU] | Freq: Once | INTRAVENOUS | Status: AC
  Administered 2016-09-04: 5000 [IU] via INTRAVENOUS
  Filled 2016-09-04: qty 5000

## 2016-09-04 NOTE — H&P (Signed)
Admit date: 09/04/2016 Referring Physician Dr. Jeraldine Loots Primary Cardiologist Dr. [name] Chief complaint/reason for admission: Shortness of breath  HPI: 61 year old man who had a stroke in 2010. He has residual right-sided weakness.  He has noted increased shortness of breath and cough. He came to the emergency room and ECG showed that he was in atrial flutter.  BNP was elevated. Chest x-ray showed marked cardiomegaly.  He does not complain of palpitations. It is difficult for him to lie flat and breathe. He has been coughing more than usual. He has chronic right-sided weakness.  IV Cardizem was started in the emergency room but he began to drop his heart rate into the high 30s and 40s. I recommended that they stop the IV Cardizem. Heart rate was in the 80s on his initial ECG.    PMH:    Past Medical History:  Diagnosis Date  . Diabetes mellitus   . Hypertension   . Stroke [facility])     PSH:    Past Surgical History:  Procedure Laterality Date  . TRACHEOSTOMY      ALLERGIES:   Patient has no known allergies.  Prior to Admit Meds:   (Not in a hospital admission) Family HX:    Hypertension Social HX:    Social History   Social History  . Marital status: Single    Spouse name: N/A  . Number of children: N/A  . Years of education: N/A   Occupational History  . Not on file.   Social History Main Topics  . Smoking status: Never Smoker  . Smokeless tobacco: Never Used  . Alcohol use No  . Drug use: No  . Sexual activity: Not on file   Other Topics Concern  . Not on file   Social History Narrative  . No narrative on file     ROS:  All 11 ROS were addressed and are negative except what is stated in the HPI  PHYSICAL EXAM Vitals:   09/04/16 2200 09/04/16 2255  BP: 119/85 114/60  Pulse: (!) 46 (!) 57  Resp: (!) 30   Temp:    SpO2: 99% 98%   General: Well developed, well nourished, in no acute distress Head: Eyes PERRLA, No xanthomas.   Normal cephalic  and atramatic  Lungs:   No wheezing bilaterally to auscultation. Heart:  Irregularly irregular S1 S2 Pulses are 2+ & equal.            No carotid bruit. No JVD.  No abdominal bruits. No femoral bruits. Abdomen: Bowel sounds are positive, abdomen soft and non-tender without masses or                  Hernia's noted. Msk:  Back normal, normal gait. Normal strength and tone for age. Extremities:   Right leg edema noted, unsure of chronicity.  DP +1 Neuro: Alert and oriented X 3. Psych:  Flat affect, responds appropriately   Labs:   Lab Results  Component Value Date   WBC 8.6 09/04/2016   HGB 14.2 09/04/2016   HCT 43.5 09/04/2016   MCV 88.2 09/04/2016   PLT 235 09/04/2016    Recent Labs Lab 09/04/16 1821  NA 139  K 4.3  CL 107  CO2 22  BUN 13  CREATININE 1.33*  CALCIUM 9.5  GLUCOSE 142*   No results found for: CKTOTAL, CKMB, CKMBINDEX, TROPONINI No results found for: PTT No results found for: INR, PROTIME   Lab Results  Component Value Date  CHOL 121 07/07/2007   CHOL 116 02/12/2007   CHOL 156 10/22/2006   Lab Results  Component Value Date   HDL 31 (L) 07/07/2007   HDL 33 (L) 02/12/2007   HDL 37 (L) 10/22/2006   Lab Results  Component Value Date   LDLCALC 62 07/07/2007   LDLCALC 55 02/12/2007   LDLCALC 80 10/22/2006   Lab Results  Component Value Date   TRIG 140 07/07/2007   TRIG 138 02/12/2007   TRIG 196 (H) 10/22/2006   Lab Results  Component Value Date   CHOLHDL 3.9 Ratio 07/07/2007   CHOLHDL 3.5 Ratio 02/12/2007   CHOLHDL 4.2 Ratio 10/22/2006   No results found for: LDLDIRECT    Radiology:  Dg Chest 2 View  Result Date: 09/04/2016 CLINICAL DATA:  Increased exertional shortness of breath for 4-5 days, history hypertension, diabetes mellitus EXAM: CHEST  2 VIEW COMPARISON:  08/06/2012 FINDINGS: Enlargement of cardiac silhouette. Mediastinal contours and pulmonary vascularity normal. RIGHT basilar subsegmental atelectasis. Lungs otherwise clear.  No infiltrate, pleural effusion or pneumothorax. Bones unremarkable. IMPRESSION: Enlargement of cardiac silhouette. RIGHT basilar subsegmental atelectasis. Electronically Signed   By: Ulyses Southward M.D.   On: 09/04/2016 19:00    EKG:  Atrial flutter with controlled ventricular response  ASSESSMENT: Atrial flutter  PLAN:  Start IV heparin. No need for IV rate control meds at this point.  He also has heart failure. Check echocardiogram to determine if this is systolic or diastolic. He'll need IV Lasix as well to help with his breathing. I suspect his cough may be related to volume, although pulmonary edema was not noted on his chest x-ray.  Prior CVA with residual right-sided weakness. His chads Vasc  score will be high. Would have to consider long-term anticoagulation.  Hypertension: Blood pressure controlled.  DM: COntinue home meds.  Start SSI.  Elevated troponin: I suspect this is from heart failure.  Lance Muss, MD  09/04/2016  11:04 PM

## 2016-09-04 NOTE — ED Notes (Signed)
Lactic Acid 0.13

## 2016-09-04 NOTE — ED Provider Notes (Signed)
[facility]-EMERGENCY DEPT Provider Note   CSN: [id number] Arrival date & time: 09/04/16  1808     History   Chief Complaint Chief Complaint  Patient presents with  . Shortness of Breath    HPI [name] is a 61 y.o. male.  HPI  61 y.o. male with a hx of DM, HTN, Stroke, presents to the Emergency Department today due to increased shortness of breath with exertion x 4-5 days. Notes mild shortness of breath at rest. No active chest pain. No pain in general. Limited HPI from patient due to previous stroke in 2007. Pt family states that he has trouble communicating ever since stroke and this is baseline. Pt denies N/V/D. No diaphoresis. No numbness/tingling. Does note mild swelling in BLE. Pt and family state no hx ACS. No cardiac hx. No hx DVT/PE. No other symptoms noted.   Past Medical History:  Diagnosis Date  . Diabetes mellitus   . Hypertension   . Stroke [facility])     Patient Active Problem List   Diagnosis Date Noted  . CARDIOMYOPATHY, SECONDARY 06/02/2008  . ABNORMAL EKG 05/02/2008  . DIABETES MELLITUS, TYPE II 04/28/2008  . HYPERLIPIDEMIA-MIXED 04/28/2008  . MIGRAINE HEADACHE 04/28/2008  . CVA 04/28/2008    Past Surgical History:  Procedure Laterality Date  . TRACHEOSTOMY         Home Medications    Prior to Admission medications   Medication Sig Start Date End Date Taking? Authorizing Provider  acetaminophen (TYLENOL) 500 MG tablet Take 1,000 mg by mouth every 6 (six) hours as needed for pain.    [provider]  fluticasone (FLONASE) 50 MCG/ACT nasal spray Place 2 sprays into the nose daily. 07/30/12   Hayden Rasmussen, NP  glipiZIDE (GLUCOTROL) 10 MG tablet Take 10 mg by mouth daily.    [provider]  loratadine (CLARITIN) 10 MG tablet Take 10 mg by mouth daily.    [provider]  meloxicam (MOBIC) 15 MG tablet Take 1 tablet (15 mg total) by mouth daily. 02/13/16   Dorena Bodo, NP  metFORMIN (GLUCOPHAGE) 1000 MG tablet Take 1,000  mg by mouth 2 (two) times daily.    [provider]  Multiple Vitamin (MULTIVITAMIN WITH MINERALS) TABS Take 1 tablet by mouth daily.    [provider]  omega-3 acid ethyl esters (LOVAZA) 1 G capsule Take 1 g by mouth daily.    [provider]  simvastatin (ZOCOR) 40 MG tablet Take 40 mg by mouth daily.    [provider]  trimethoprim-polymyxin b (POLYTRIM) ophthalmic solution Place 1 drop into the right eye every 4 (four) hours. 08/06/12   Renne Crigler, PA-C    Family History No family history on file.  Social History Social History  Substance Use Topics  . Smoking status: Never Smoker  . Smokeless tobacco: Never Used  . Alcohol use No     Allergies   Patient has no known allergies.   Review of Systems Review of Systems ROS reviewed and all are negative for acute change except as noted in the HPI.  Physical Exam Updated Vital Signs BP (!) 145/79 (BP Location: Left Arm)   Pulse 68   Temp 98.1 F (36.7 C) (Oral)   Resp 20   SpO2 99%   Physical Exam  Constitutional: He is oriented to person, place, and time. Vital signs are normal. He appears well-developed and well-nourished. No distress.  HENT:  Head: Normocephalic and atraumatic.  Right Ear: Hearing, tympanic membrane, external ear  and ear canal normal.  Left Ear: Hearing, tympanic membrane, external ear and ear canal normal.  Nose: Nose normal.  Mouth/Throat: Uvula is midline, oropharynx is clear and moist and mucous membranes are normal. No trismus in the jaw. No oropharyngeal exudate, posterior oropharyngeal erythema or tonsillar abscesses.  Eyes: Pupils are equal, round, and reactive to light. Conjunctivae and EOM are normal.  Neck: Normal range of motion. Neck supple. No tracheal deviation present.  Cardiovascular: Normal rate, regular rhythm, S1 normal, S2 normal, normal heart sounds, intact distal pulses and normal pulses.   Pulmonary/Chest: Effort normal and breath sounds  normal. No respiratory distress. He has no decreased breath sounds. He has no wheezes. He has no rhonchi. He has no rales.  Abdominal: Normal appearance and bowel sounds are normal. There is no tenderness.  Musculoskeletal: Normal range of motion.  ~1+ pitting edema bilaterally. NVI.  Neurological: He is alert and oriented to person, place, and time.  Skin: Skin is warm and dry.  Psychiatric: He has a normal mood and affect. His speech is normal and behavior is normal. Thought content normal.  Nursing note and vitals reviewed.    ED Treatments / Results  Labs (all labs ordered are listed, but only abnormal results are displayed) Labs Reviewed  BASIC METABOLIC PANEL - Abnormal; Notable for the following:       Result Value   Glucose, Bld 142 (*)    Creatinine, Ser 1.33 (*)    GFR calc non Af Amer 57 (*)    All other components within normal limits  I-STAT TROPONIN, ED - Abnormal; Notable for the following:    Troponin i, poc 0.13 (*)    All other components within normal limits  CBC  BRAIN NATRIURETIC PEPTIDE  MAGNESIUM    EKG  EKG Interpretation  Date/Time:  Wednesday September 04 2016 18:10:07 EDT Ventricular Rate:  88 PR Interval:    QRS Duration: 90 QT Interval:  376 QTC Calculation: 454 R Axis:   -28 Text Interpretation:  Atrial flutter with variable A-V block with premature ventricular or aberrantly conducted complexes ST-t wave abnormality Artifact Abnormal ekg Confirmed by Gerhard Munch [phone]) on 09/04/2016 6:27:24 PM       Radiology Dg Chest 2 View  Result Date: 09/04/2016 CLINICAL DATA:  Increased exertional shortness of breath for 4-5 days, history hypertension, diabetes mellitus EXAM: CHEST  2 VIEW COMPARISON:  08/06/2012 FINDINGS: Enlargement of cardiac silhouette. Mediastinal contours and pulmonary vascularity normal. RIGHT basilar subsegmental atelectasis. Lungs otherwise clear. No infiltrate, pleural effusion or pneumothorax. Bones unremarkable.  IMPRESSION: Enlargement of cardiac silhouette. RIGHT basilar subsegmental atelectasis. Electronically Signed   By: Ulyses Southward M.D.   On: 09/04/2016 19:00    Procedures Procedures (including critical care time) CRITICAL CARE Performed by: Eston Esters   Total critical care time: 35 minutes  Critical care time was exclusive of separately billable procedures and treating other patients.  Critical care was necessary to treat or prevent imminent or life-threatening deterioration.  Critical care was time spent personally by me on the following activities: development of treatment plan with patient and/or surrogate as well as nursing, discussions with consultants, evaluation of patient's response to treatment, examination of patient, obtaining history from patient or surrogate, ordering and performing treatments and interventions, ordering and review of laboratory studies, ordering and review of radiographic studies, pulse oximetry and re-evaluation of patient's condition.   Medications Ordered in ED Medications  diltiazem (CARDIZEM) 1 mg/mL load via infusion 10 mg (not administered)  And  diltiazem (CARDIZEM) 100 mg in dextrose 5 % 100 mL (1 mg/mL) infusion (not administered)  furosemide (LASIX) injection 20 mg (not administered)     Initial Impression / Assessment and Plan / ED Course  I have reviewed the triage vital signs and the nursing notes.  Pertinent labs & imaging results that were available during my care of the patient were reviewed by me and considered in my medical decision making (see chart for details).  Final Clinical Impressions(s) / ED Diagnoses  {I have reviewed and evaluated the relevant laboratory values. {I have reviewed and evaluated the relevant imaging studies. {I have interpreted the relevant EKG. {I have reviewed the relevant previous healthcare records.  {I obtained HPI from historian. {Patient discussed with supervising physician.  ED  Course:  Assessment: Pt is a 61 y.o. male with a hx of DM, HTN, Stroke, presents to the Emergency Department today due to increased shortness of breath with exertion x 4-5 days. Notes mild shortness of breath at rest. No active chest pain. No pain in general. Limited HPI from patient due to previous stroke. Pt family states that he has trouble communicating ever since stroke and this is baseline. Pt denies N/V/D. No diaphoresis. No numbness/tingling. Does note mild swelling in BLE. Pt and family state no hx ACS. No cardiac hx. No hx DVT/PE. Marland Kitchen On exam, pt in NAD. Nontoxic/nonseptic appearing. VSS. Afebrile. Lungs CTA. Heart RRR. Abdomen nontender soft. CBC unremarkable. BMP with mild elevation in creatinine 1.33. Trop 0.13. EKG shows . CXR with enlargement of cardiac silhouette as well as right basilar atelectasis. BNP ordered. Appears to be A Flutter. New Onset. CHADVASc 4 (HTN, DM, Hx CVA). Consult to Cardiology (Dr. Eldridge Dace). Given Dilt 10mg  Bolus as well as Infusion in ED. Started on Heparin. Plan is to Admit.   Disposition/Plan:  Admit Pt acknowledges and agrees with plan  Supervising Physician Gerhard Munch, MD  Final diagnoses:  Atrial flutter, unspecified type [facility])    New Prescriptions New Prescriptions   No medications on file     Wilber Bihari 09/04/16 2113    Gerhard Munch, MD 09/10/16 [phone]

## 2016-09-04 NOTE — ED Triage Notes (Addendum)
Patient complains of increased exertional shortness of breath x 4-5 days. No CP, denies pain. Patient has had a stroke and has some difficulty with speech. Alert and appears in no distress. Resp even and unlabored. Family member reports that patient has had cough

## 2016-09-04 NOTE — ED Notes (Signed)
Informed first nurse Scarlett of troponin of 0.13

## 2016-09-04 NOTE — Progress Notes (Signed)
ANTICOAGULATION CONSULT NOTE - Initial Consult  Pharmacy Consult for heparin Indication: atrial fibrillation  No Known Allergies  Patient Measurements: Height: 6' (182.9 cm) Weight: 190 lb (86.2 kg) IBW/kg (Calculated) : 77.6 Heparin Dosing Weight: 86 kg  Vital Signs: Temp: 98.1 F (36.7 C) (08/29 1819) Temp Source: Oral (08/29 1819) BP: 146/89 (08/29 2100) Pulse Rate: 72 (08/29 2100)  Labs:  Recent Labs  09/04/16 1821  HGB 14.2  HCT 43.5  PLT 235  CREATININE 1.33*    Estimated Creatinine Clearance: 64.8 mL/min (A) (by C-G formula based on SCr of 1.33 mg/dL (H)).  Assessment: CC/HPI: 61 yo m presenting with SOB x 4-5 d  PMH: hx of stroke, HTN, DM  Anticoag: none pta - iv hep for new onset aflutter  Renal: SCr 1.33  Heme/Onc: H&H 14.2/43.5, Plt 235  Goal of Therapy:  Heparin level 0.3-0.7 units/ml Monitor platelets by anticoagulation protocol: Yes   Plan:  Heparin bolus 5000 units Heparin infusion 1250 units/hr Initial lvl with am labs Daily HL, CBC F/U plans for [facility]  Isaac Bliss, PharmD, BCPS, BCCCP Clinical Pharmacist Clinical phone for 09/04/2016 from 7a-3:30p: [phone] If after 3:30p, please call main pharmacy at: [phone] 09/04/2016 9:50 PM

## 2016-09-04 NOTE — ED Notes (Signed)
Verified HEPARIN Ladona Ridgel, RN

## 2016-09-05 ENCOUNTER — Encounter (HOSPITAL_COMMUNITY): Payer: Self-pay | Admitting: *Deleted

## 2016-09-05 ENCOUNTER — Inpatient Hospital Stay (HOSPITAL_COMMUNITY): Payer: Medicaid Other

## 2016-09-05 DIAGNOSIS — I509 Heart failure, unspecified: Secondary | ICD-10-CM

## 2016-09-05 DIAGNOSIS — I484 Atypical atrial flutter: Secondary | ICD-10-CM

## 2016-09-05 DIAGNOSIS — E119 Type 2 diabetes mellitus without complications: Secondary | ICD-10-CM

## 2016-09-05 DIAGNOSIS — I361 Nonrheumatic tricuspid (valve) insufficiency: Secondary | ICD-10-CM

## 2016-09-05 DIAGNOSIS — E7800 Pure hypercholesterolemia, unspecified: Secondary | ICD-10-CM

## 2016-09-05 LAB — GLUCOSE, CAPILLARY
GLUCOSE-CAPILLARY: 62 mg/dL — AB (ref 65–99)
GLUCOSE-CAPILLARY: 100 mg/dL — AB (ref 65–99)
Glucose-Capillary: 111 mg/dL — ABNORMAL HIGH (ref 65–99)
Glucose-Capillary: 156 mg/dL — ABNORMAL HIGH (ref 65–99)
Glucose-Capillary: 121 mg/dL — ABNORMAL HIGH (ref 65–99)

## 2016-09-05 LAB — MAGNESIUM: MAGNESIUM: 2 mg/dL (ref 1.7–2.4)

## 2016-09-05 LAB — CBC
HEMATOCRIT: 43 % (ref 39.0–52.0)
Hemoglobin: 14.1 g/dL (ref 13.0–17.0)
MCH: 29 pg (ref 26.0–34.0)
MCHC: 32.8 g/dL (ref 30.0–36.0)
MCV: 88.3 fL (ref 78.0–100.0)
PLATELETS: 225 10*3/uL (ref 150–400)
RBC: 4.87 MIL/uL (ref 4.22–5.81)
RDW: 15.1 % (ref 11.5–15.5)
WBC: 8.4 10*3/uL (ref 4.0–10.5)

## 2016-09-05 LAB — BASIC METABOLIC PANEL
ANION GAP: 9 (ref 5–15)
BUN: 15 mg/dL (ref 6–20)
CALCIUM: 9.1 mg/dL (ref 8.9–10.3)
CO2: 21 mmol/L — ABNORMAL LOW (ref 22–32)
Chloride: 109 mmol/L (ref 101–111)
Creatinine, Ser: 1.18 mg/dL (ref 0.61–1.24)
GFR calc Af Amer: >60 mL/min (ref >60)
GLUCOSE: 117 mg/dL — AB (ref 65–99)
Potassium: 3.7 mmol/L (ref 3.5–5.1)
Sodium: 139 mmol/L (ref 135–145)

## 2016-09-05 LAB — HEMOGLOBIN A1C
Hgb A1c MFr Bld: 6.6 % — ABNORMAL HIGH (ref 4.8–5.6)
Mean Plasma Glucose: 142.72 mg/dL

## 2016-09-05 LAB — TROPONIN I: Troponin I: 0.12 ng/mL (ref <0.03)

## 2016-09-05 LAB — ECHOCARDIOGRAM COMPLETE
Height: 72 in
Weight: 2496 oz

## 2016-09-05 LAB — HEPARIN LEVEL (UNFRACTIONATED)
HEPARIN UNFRACTIONATED: 1.18 [IU]/mL — AB (ref 0.30–0.70)
Heparin Unfractionated: 0.74 IU/mL — ABNORMAL HIGH (ref 0.30–0.70)
Heparin Unfractionated: 0.28 IU/mL — ABNORMAL LOW (ref 0.30–0.70)

## 2016-09-05 LAB — HIV ANTIBODY (ROUTINE TESTING W REFLEX): HIV SCREEN 4TH GENERATION: NONREACTIVE

## 2016-09-05 MED ORDER — ONDANSETRON HCL 4 MG/2ML IJ SOLN: 4.0000 mg | Freq: Four times a day (QID) | INTRAMUSCULAR | Status: DC | PRN

## 2016-09-05 MED ORDER — POTASSIUM CHLORIDE CRYS ER 20 MEQ PO TBCR
40.0000 meq | EXTENDED_RELEASE_TABLET | Freq: Once | ORAL | Status: AC
  Administered 2016-09-05: 40 meq via ORAL
  Filled 2016-09-05: qty 2

## 2016-09-05 MED ORDER — INSULIN ASPART 100 UNIT/ML ~~LOC~~ SOLN
0.0000 [IU] | Freq: Three times a day (TID) | SUBCUTANEOUS | Status: DC
  Administered 2016-09-06: 2 [IU] via SUBCUTANEOUS
  Administered 2016-09-08: 3 [IU] via SUBCUTANEOUS

## 2016-09-05 MED ORDER — SIMVASTATIN 40 MG PO TABS
40.0000 mg | ORAL_TABLET | Freq: Every day | ORAL | Status: DC
Start: 2016-09-05 — End: 2016-09-09
  Administered 2016-09-05 – 2016-09-09 (×5): 40 mg via ORAL
  Filled 2016-09-05 (×5): qty 1

## 2016-09-05 MED ORDER — ACETAMINOPHEN 325 MG PO TABS
650.0000 mg | ORAL_TABLET | ORAL | Status: DC | PRN
  Administered 2016-09-06 – 2016-09-07 (×4): 650 mg via ORAL
  Filled 2016-09-05 (×4): qty 2

## 2016-09-05 MED ORDER — FUROSEMIDE 10 MG/ML IJ SOLN
40.0000 mg | Freq: Two times a day (BID) | INTRAMUSCULAR | Status: DC
  Administered 2016-09-05 – 2016-09-07 (×4): 40 mg via INTRAVENOUS
  Filled 2016-09-05 (×4): qty 4

## 2016-09-05 MED ORDER — HEPARIN (PORCINE) IN NACL 100-0.45 UNIT/ML-% IJ SOLN
950.0000 [IU]/h | INTRAMUSCULAR | Status: DC
  Administered 2016-09-05: 950 [IU]/h via INTRAVENOUS

## 2016-09-05 MED ORDER — ASPIRIN 300 MG RE SUPP: 300.0000 mg | RECTAL | Status: AC

## 2016-09-05 MED ORDER — GLIPIZIDE 10 MG PO TABS
10.0000 mg | ORAL_TABLET | Freq: Every day | ORAL | Status: DC
  Administered 2016-09-05 – 2016-09-09 (×5): 10 mg via ORAL
  Filled 2016-09-05 (×5): qty 1

## 2016-09-05 MED ORDER — NITROGLYCERIN 0.4 MG SL SUBL: 0.4000 mg | SUBLINGUAL_TABLET | SUBLINGUAL | Status: DC | PRN

## 2016-09-05 MED ORDER — DEXTROSE 50 % IV SOLN
INTRAVENOUS | Status: AC
  Administered 2016-09-05: 25 mL
  Filled 2016-09-05: qty 50

## 2016-09-05 MED ORDER — METFORMIN HCL 500 MG PO TABS
1000.0000 mg | ORAL_TABLET | Freq: Two times a day (BID) | ORAL | Status: DC
  Administered 2016-09-05 – 2016-09-09 (×8): 1000 mg via ORAL
  Filled 2016-09-05 (×8): qty 2

## 2016-09-05 MED ORDER — ASPIRIN EC 81 MG PO TBEC
81.0000 mg | DELAYED_RELEASE_TABLET | Freq: Every day | ORAL | Status: DC
  Administered 2016-09-05 – 2016-09-09 (×5): 81 mg via ORAL
  Filled 2016-09-05 (×5): qty 1

## 2016-09-05 MED ORDER — OMEGA-3-ACID ETHYL ESTERS 1 G PO CAPS
1.0000 g | ORAL_CAPSULE | Freq: Every day | ORAL | Status: DC
  Administered 2016-09-05 – 2016-09-09 (×5): 1 g via ORAL
  Filled 2016-09-05 (×5): qty 1

## 2016-09-05 MED ORDER — LORATADINE 10 MG PO TABS
10.0000 mg | ORAL_TABLET | Freq: Every day | ORAL | Status: DC
Start: 2016-09-05 — End: 2016-09-09
  Administered 2016-09-05 – 2016-09-09 (×5): 10 mg via ORAL
  Filled 2016-09-05 (×5): qty 1

## 2016-09-05 MED ORDER — ADULT MULTIVITAMIN W/MINERALS CH
1.0000 | ORAL_TABLET | Freq: Every day | ORAL | Status: DC
  Administered 2016-09-05 – 2016-09-09 (×5): 1 via ORAL
  Filled 2016-09-05 (×4): qty 1

## 2016-09-05 MED ORDER — OFF THE BEAT BOOK
Freq: Once | Status: AC
  Administered 2016-09-05: 06:00:00
  Filled 2016-09-05: qty 1

## 2016-09-05 MED ORDER — HEPARIN (PORCINE) IN NACL 100-0.45 UNIT/ML-% IJ SOLN
1000.0000 [IU]/h | INTRAMUSCULAR | Status: DC
  Administered 2016-09-05: 1000 [IU]/h via INTRAVENOUS
  Filled 2016-09-05 (×2): qty 250

## 2016-09-05 MED ORDER — POLYMYXIN B-TRIMETHOPRIM 10000-0.1 UNIT/ML-% OP SOLN: 1.0000 [drp] | OPHTHALMIC | Status: DC

## 2016-09-05 MED ORDER — ASPIRIN 81 MG PO CHEW
324.0000 mg | CHEWABLE_TABLET | ORAL | Status: AC
  Administered 2016-09-05: 324 mg via ORAL
  Filled 2016-09-05: qty 4

## 2016-09-05 NOTE — Progress Notes (Signed)
ANTICOAGULATION CONSULT NOTE - Follow Up Consult  Pharmacy Consult for heparin Indication: Aflutter  Labs:  Recent Labs  09/04/16 1821 09/05/16 0150  HGB 14.2 14.1  HCT 43.5 43.0  PLT 235 225  HEPARINUNFRC  --  0.74*  CREATININE 1.33*  --     Assessment/Plan:  61yo male above goal on heparin with initial dosing though lab was drawn early just 3h after bolus and level will likely come down. Will continue gtt at current rate and check additional level.   Vernard Gambles, PharmD, BCPS  09/05/2016,2:37 AM

## 2016-09-05 NOTE — Progress Notes (Signed)
ANTICOAGULATION CONSULT NOTE - Follow Up Consult  Pharmacy Consult for Heparin Indication: aflutter  No Known Allergies  Patient Measurements: Height: 6' (182.9 cm) Weight: 156 lb (70.8 kg) IBW/kg (Calculated) : 77.6 Heparin Dosing Weight:  70.8 kg  Vital Signs: Temp: 97.6 F (36.4 C) (08/30 1500) Temp Source: Oral (08/30 1500) BP: 122/72 (08/30 1500) Pulse Rate: 88 (08/30 1500)  Labs:  Recent Labs  09/04/16 1821 09/05/16 0150 09/05/16 0606 09/05/16 1804  HGB 14.2 14.1  --   --   HCT 43.5 43.0  --   --   PLT 235 225  --   --   HEPARINUNFRC  --  0.74* 1.18* 0.28*  CREATININE 1.33* 1.18  --   --   TROPONINI  --  0.12*  --   --     Estimated Creatinine Clearance: 66.7 mL/min (by C-G formula based on SCr of 1.18 mg/dL).   Assessment: Anticoag: none pta - iv hep for new onset aflutter with h/o CVA. Previous HL 0.74 drawn early. AM HL 0.59 in goal range. CBC remains WNL and stable.  PM f/u  - heparin level now slightly below goal on 950 units/hr.  No overt bleeding or complications noted.  Goal of Therapy:  Heparin level 0.3-0.7 units/ml Monitor platelets by anticoagulation protocol: Yes   Plan:  Increase IV heparin to 1000 units/hr. Check heparin level again with AM labs. Daily heparin level and CBC.  Tad Moore, BCPS  Clinical Pharmacist Pager [phone]  09/05/2016 7:04 PM

## 2016-09-05 NOTE — Plan of Care (Signed)
Problem: Cardiac: Goal: Ability to achieve and maintain adequate cardiopulmonary perfusion will improve Outcome: Progressing Remains in a-fib/flutter, but VSS.  Problem: Education: Goal: Knowledge of disease or condition will improve Outcome: Progressing Off the Beat book ordered.

## 2016-09-05 NOTE — Progress Notes (Signed)
  Echocardiogram 2D Echocardiogram has been performed.  [name] 09/05/2016, 11:38 AM

## 2016-09-05 NOTE — Progress Notes (Signed)
10 beats NSVT on telemetry.  Dr. Eldridge Dace notified.  Order received to give KCl 40 mEq po and check magnesium level.  Will continue to monitor.  Alonza Bogus

## 2016-09-05 NOTE — Progress Notes (Addendum)
Progress Note  Patient Name: [name] Date of Encounter: 09/05/2016  Primary Cardiologist: [name]  Subjective   Difficult to obtain history due to expressive aphasia. I'm not sure whether his breathing has improved, but he appears comfortable sitting up in bed.  Inpatient Medications    Scheduled Meds: . aspirin EC  81 mg Oral Daily  . furosemide  40 mg Intravenous BID  . glipiZIDE  10 mg Oral Q breakfast  . insulin aspart  0-15 Units Subcutaneous TID WC  . loratadine  10 mg Oral Daily  . metFORMIN  1,000 mg Oral BID WC  . multivitamin with minerals  1 tablet Oral Daily  . omega-3 acid ethyl esters  1 g Oral Daily  . simvastatin  40 mg Oral Daily   Continuous Infusions: . heparin 1,250 Units/hr (09/04/16 2251)   PRN Meds: acetaminophen, nitroGLYCERIN, ondansetron (ZOFRAN) IV   Vital Signs    Vitals:   09/04/16 2255 09/04/16 2330 09/05/16 0121 09/05/16 0505  BP: 114/60 118/71 127/72 121/85  Pulse: (!) 57 62 (!) 59 (!) 125  Resp:  18 20 18   Temp:   97.6 F (36.4 C) 97.6 F (36.4 C)  TempSrc:   Axillary Oral  SpO2: 98% 99% 100% 99%  Weight:   156 lb (70.8 kg) 156 lb (70.8 kg)  Height:   6' (1.829 m)     Intake/Output Summary (Last 24 hours) at 09/05/16 1007 Last data filed at 09/05/16 0957  Gross per 24 hour  Intake           100.17 ml  Output             1690 ml  Net         -1589.83 ml   Filed Weights   09/04/16 2100 09/05/16 0121 09/05/16 0505  Weight: 190 lb (86.2 kg) 156 lb (70.8 kg) 156 lb (70.8 kg)    Telemetry    AFlutter with variable AV block, HR in 40s at night, 120s with activity - Personally Reviewed  ECG    Atrial flutter (atypical, possibly left sided), left axis deviation - Personally Reviewed  Physical Exam  Alert and oriented, severe expressive aphasia. Vocabulary limited to verbal "yes" and shakes of head for "no" GEN: No acute distress.   Neck: No JVD Cardiac:  irregular, no murmurs, rubs, or gallops.  Respiratory:  Clear to auscultation bilaterally. GI: Soft, nontender, non-distended  MS:  trivial retromalleolar symmetrical edema; No deformity. Neuro:   mild right hemiparesis, expressive aphasia Psych: Normal affect   Labs    Chemistry Recent Labs Lab 09/04/16 1821 09/05/16 0150  NA 139 139  K 4.3 3.7  CL 107 109  CO2 22 21*  GLUCOSE 142* 117*  BUN 13 15  CREATININE 1.33* 1.18  CALCIUM 9.5 9.1  GFRNONAA 57* >60  GFRAA >60 >60  ANIONGAP 10 9     Hematology Recent Labs Lab 09/04/16 1821 09/05/16 0150  WBC 8.6 8.4  RBC 4.93 4.87  HGB 14.2 14.1  HCT 43.5 43.0  MCV 88.2 88.3  MCH 28.8 29.0  MCHC 32.6 32.8  RDW 15.1 15.1  PLT 235 225    Cardiac Enzymes Recent Labs Lab 09/05/16 0150  TROPONINI 0.12*    Recent Labs Lab 09/04/16 1839  TROPIPOC 0.13*     BNP Recent Labs Lab 09/04/16 2101  BNP 696.7*     DDimer No results for input(s): DDIMER in the last 168 hours.   Radiology    Dg Chest  2 View  Result Date: 09/04/2016 CLINICAL DATA:  Increased exertional shortness of breath for 4-5 days, history hypertension, diabetes mellitus EXAM: CHEST  2 VIEW COMPARISON:  08/06/2012 FINDINGS: Enlargement of cardiac silhouette. Mediastinal contours and pulmonary vascularity normal. RIGHT basilar subsegmental atelectasis. Lungs otherwise clear. No infiltrate, pleural effusion or pneumothorax. Bones unremarkable. IMPRESSION: Enlargement of cardiac silhouette. RIGHT basilar subsegmental atelectasis. Electronically Signed   By: Ulyses Southward M.D.   On: 09/04/2016 19:00    Cardiac Studies   ECHO 2010 Left ventricle: The cavity size was normal. There was moderate concentric hypertrophy. Systolic function was normal. The estimated ejection fraction was in the range of 55% to 60%. Wall motion was normal; there were no regional wall motion abnormalities. Aortic valve: Structurally normal valve. Trileaflet. Cusp separation was normal. Doppler: Transvalvular velocity was within  the normal range. There was no stenosis. No regurgitation. Aorta: Aortic root: The aortic root was normal in size. Mitral valve: Structurally normal valve. Leaflet separation was normal. Doppler: Transvalvular velocity was within the normal range. There was no evidence for stenosis. No regurgitation. Left atrium: The atrium was moderately dilated. Right ventricle: The cavity size was normal. Wall thickness was normal. Systolic function was normal. Tricuspid valve: Structurally normal valve. Leaflet separation was normal. Doppler: Transvalvular velocity was within the normal range. Trivial regurgitation. Pulmonary artery: Poorly visualized. The main pulmonary artery was normal-sized. Right atrium: The atrium was normal in size. Pericardium: A trivial pericardial effusion was identified posterior to the heart. Systemic veins: Inferior vena cava: The vessel was normal in size; the respirophasic diameter changes were in the normal range (= 50%); findings are consistent with normal central venous pressure.   Patient Profile     61 y.o. male with newly diagnosed persistent atrial flutter of uncertain duration, cardiomegaly, CHF, history of DM, HLP, previous stroke  Assessment & Plan    1. AFlutter: rate controlled and even slow ventricular response at rest, RVR with minimal activity. Keep off negative inotropes. CHADSVasc at least 4 (CVA 2, DM, CHF). May need cath so will keep on heparin for now, but plan to switch to DOAC as soon as possible in anticipation of possible TEE-DCCV. Echo 2010 described moderately dilated LA (ESD 57 mm -severely dilated?). 2. CHF: cardiomegaly on CXR, but no recent LVEF assessment. Echo today. Good diuresis overnight. K and creat remain normal. "Dry weight" uncertain. Await echo ([place] [address] - EF 55-60%). Start low dose ARB if EF is low. 3. Coronary risk factors: high risk for CAD, needs minimum of a nuclear stress test or  coronary angio (would go straight to invasive evaluation if EF is low or has regional wall motion abnormalities). 4. DM: controlled - A1c 6.6%  Signed, Thurmon Fair, MD  09/05/2016, 10:07 AM

## 2016-09-05 NOTE — Plan of Care (Signed)
Problem: Safety: Goal: Ability to remain free from injury will improve Outcome: Progressing Bed alarm on, personal items within reach, yellow socks and yellow armband on.  Pt instructed to call for assistance to get out of bed; family member at bedside.

## 2016-09-05 NOTE — Progress Notes (Addendum)
ANTICOAGULATION CONSULT NOTE - Follow Up Consult  Pharmacy Consult for Heparin Indication: aflutter  No Known Allergies  Patient Measurements: Height: 6' (182.9 cm) Weight: 156 lb (70.8 kg) IBW/kg (Calculated) : 77.6 Heparin Dosing Weight:  70.8 kg  Vital Signs: Temp: 97.6 F (36.4 C) (08/30 0505) Temp Source: Oral (08/30 0505) BP: 121/85 (08/30 0505) Pulse Rate: 125 (08/30 0505)  Labs:  Recent Labs  09/04/16 1821 09/05/16 0150 09/05/16 0606  HGB 14.2 14.1  --   HCT 43.5 43.0  --   PLT 235 225  --   HEPARINUNFRC  --  0.74* 1.18*  CREATININE 1.33* 1.18  --   TROPONINI  --  0.12*  --     Estimated Creatinine Clearance: 66.7 mL/min (by C-G formula based on SCr of 1.18 mg/dL).   Assessment: Anticoag: none pta - iv hep for new onset aflutter with h/o CVA. Previous HL 0.74 drawn early. AM HL 0.59 in goal range. CBC remains WNL and stable.  Goal of Therapy:  Heparin level 0.3-0.7 units/ml Monitor platelets by anticoagulation protocol: Yes   Plan:  Continue IV heparin at 1250 units/hr Daily HL, CBC F/U plans for [facility]  Adden: During progression rounds, lab had called RN for "corrected heparin level 1.18. Called lab for explanation of previous lab result which was human inputting error. Lab >1.1. Will hold heparin x 1 hr, then decrease rate to 950 units/hr (14 units/kg/hr)  [name], PharmD, BCPS Clinical Staff Pharmacist Pager [phone]  [name] 09/05/2016,8:09 AM

## 2016-09-06 ENCOUNTER — Inpatient Hospital Stay (HOSPITAL_COMMUNITY)
Admission: EM | Disposition: A | Discharge status: Home / Self Care | Payer: Self-pay | Source: Home / Self Care | Attending: Interventional Cardiology

## 2016-09-06 DIAGNOSIS — I2510 Atherosclerotic heart disease of native coronary artery without angina pectoris: Secondary | ICD-10-CM

## 2016-09-06 DIAGNOSIS — I5021 Acute systolic (congestive) heart failure: Secondary | ICD-10-CM

## 2016-09-06 DIAGNOSIS — I429 Cardiomyopathy, unspecified: Secondary | ICD-10-CM

## 2016-09-06 HISTORY — PX: RIGHT/LEFT HEART CATH AND CORONARY ANGIOGRAPHY: CATH118266

## 2016-09-06 LAB — POCT I-STAT 3, VENOUS BLOOD GAS (G3P V)
Acid-Base Excess: 1 mmol/L (ref 0.0–2.0)
Bicarbonate: 25.5 mmol/L (ref 20.0–28.0)
O2 SAT: 68 %
PCO2 VEN: 40.6 mmHg — AB (ref 44.0–60.0)
PO2 VEN: 35 mmHg (ref 32.0–45.0)
TCO2: 27 mmol/L (ref 22–32)
pH, Ven: 7.406 (ref 7.250–7.430)

## 2016-09-06 LAB — BASIC METABOLIC PANEL
ANION GAP: 11 (ref 5–15)
BUN: 15 mg/dL (ref 6–20)
CO2: 28 mmol/L (ref 22–32)
Calcium: 10.1 mg/dL (ref 8.9–10.3)
Chloride: 102 mmol/L (ref 101–111)
Creatinine, Ser: 1.05 mg/dL (ref 0.61–1.24)
GFR calc non Af Amer: >60 mL/min (ref >60)
Glucose, Bld: 69 mg/dL (ref 65–99)
POTASSIUM: 3.7 mmol/L (ref 3.5–5.1)
SODIUM: 141 mmol/L (ref 135–145)

## 2016-09-06 LAB — CBC
HEMATOCRIT: 44.8 % (ref 39.0–52.0)
HEMOGLOBIN: 14.9 g/dL (ref 13.0–17.0)
MCH: 29.3 pg (ref 26.0–34.0)
MCHC: 33.3 g/dL (ref 30.0–36.0)
MCV: 88.2 fL (ref 78.0–100.0)
Platelets: 254 10*3/uL (ref 150–400)
RBC: 5.08 MIL/uL (ref 4.22–5.81)
RDW: 15 % (ref 11.5–15.5)
WBC: 8.7 10*3/uL (ref 4.0–10.5)

## 2016-09-06 LAB — POCT I-STAT 3, ART BLOOD GAS (G3+)
Acid-Base Excess: 1 mmol/L (ref 0.0–2.0)
Bicarbonate: 24.9 mmol/L (ref 20.0–28.0)
O2 Saturation: 95 %
PCO2 ART: 37.9 mmHg (ref 32.0–48.0)
PH ART: 7.425 (ref 7.350–7.450)
PO2 ART: 75 mmHg — AB (ref 83.0–108.0)
TCO2: 26 mmol/L (ref 22–32)

## 2016-09-06 LAB — GLUCOSE, CAPILLARY
GLUCOSE-CAPILLARY: 138 mg/dL — AB (ref 65–99)
GLUCOSE-CAPILLARY: 143 mg/dL — AB (ref 65–99)
GLUCOSE-CAPILLARY: 45 mg/dL — AB (ref 65–99)
GLUCOSE-CAPILLARY: 51 mg/dL — AB (ref 65–99)
Glucose-Capillary: 149 mg/dL — ABNORMAL HIGH (ref 65–99)
Glucose-Capillary: 123 mg/dL — ABNORMAL HIGH (ref 65–99)
Glucose-Capillary: 105 mg/dL — ABNORMAL HIGH (ref 65–99)

## 2016-09-06 LAB — PROTIME-INR
INR: 0.99
PROTHROMBIN TIME: 12.9 s (ref 11.4–15.2)

## 2016-09-06 LAB — HEPARIN LEVEL (UNFRACTIONATED)
HEPARIN UNFRACTIONATED: 0.29 [IU]/mL — AB (ref 0.30–0.70)
Heparin Unfractionated: 0.99 IU/mL — ABNORMAL HIGH (ref 0.30–0.70)

## 2016-09-06 LAB — POCT ACTIVATED CLOTTING TIME: Activated Clotting Time: 98 seconds

## 2016-09-06 SURGERY — RIGHT/LEFT HEART CATH AND CORONARY ANGIOGRAPHY
Anesthesia: LOCAL

## 2016-09-06 MED ORDER — MIDAZOLAM HCL 2 MG/2ML IJ SOLN
INTRAMUSCULAR | Status: DC | PRN
Start: 2016-09-06 — End: 2016-09-06
  Administered 2016-09-06: 2 mg via INTRAVENOUS

## 2016-09-06 MED ORDER — HEPARIN (PORCINE) IN NACL 2-0.9 UNIT/ML-% IJ SOLN
INTRAMUSCULAR | Status: AC | PRN
  Administered 2016-09-06: 1000 mL

## 2016-09-06 MED ORDER — SODIUM CHLORIDE 0.9% FLUSH: 3.0000 mL | INTRAVENOUS | Status: DC | PRN

## 2016-09-06 MED ORDER — FENTANYL CITRATE (PF) 100 MCG/2ML IJ SOLN
INTRAMUSCULAR | Status: AC
  Filled 2016-09-06: qty 2

## 2016-09-06 MED ORDER — ASPIRIN 81 MG PO CHEW: 81.0000 mg | CHEWABLE_TABLET | ORAL | Status: DC

## 2016-09-06 MED ORDER — ASPIRIN 81 MG PO CHEW: 81.0000 mg | CHEWABLE_TABLET | Freq: Every day | ORAL | Status: DC

## 2016-09-06 MED ORDER — SODIUM CHLORIDE 0.9 % IV SOLN: INTRAVENOUS | Status: AC

## 2016-09-06 MED ORDER — SODIUM CHLORIDE 0.9% FLUSH
3.0000 mL | Freq: Two times a day (BID) | INTRAVENOUS | Status: DC
  Administered 2016-09-06 – 2016-09-09 (×6): 3 mL via INTRAVENOUS

## 2016-09-06 MED ORDER — FENTANYL CITRATE (PF) 100 MCG/2ML IJ SOLN
INTRAMUSCULAR | Status: DC | PRN
Start: 2016-09-06 — End: 2016-09-06
  Administered 2016-09-06: 25 ug via INTRAVENOUS

## 2016-09-06 MED ORDER — HEPARIN (PORCINE) IN NACL 2-0.9 UNIT/ML-% IJ SOLN
INTRAMUSCULAR | Status: AC
  Filled 2016-09-06: qty 1000

## 2016-09-06 MED ORDER — SODIUM CHLORIDE 0.9 % IV SOLN: 250.0000 mL | INTRAVENOUS | Status: DC | PRN

## 2016-09-06 MED ORDER — LIDOCAINE HCL (PF) 1 % IJ SOLN
INTRAMUSCULAR | Status: DC | PRN
  Administered 2016-09-06: 20 mL via SUBCUTANEOUS

## 2016-09-06 MED ORDER — MIDAZOLAM HCL 2 MG/2ML IJ SOLN
INTRAMUSCULAR | Status: AC
  Filled 2016-09-06: qty 2

## 2016-09-06 MED ORDER — HEPARIN (PORCINE) IN NACL 100-0.45 UNIT/ML-% IJ SOLN
1050.0000 [IU]/h | INTRAMUSCULAR | Status: DC
Start: 2016-09-07 — End: 2016-09-07
  Administered 2016-09-07: 1050 [IU]/h via INTRAVENOUS
  Filled 2016-09-06: qty 250

## 2016-09-06 MED ORDER — SODIUM CHLORIDE 0.9% FLUSH
3.0000 mL | Freq: Two times a day (BID) | INTRAVENOUS | Status: DC
  Administered 2016-09-06: 3 mL via INTRAVENOUS

## 2016-09-06 MED ORDER — SODIUM CHLORIDE 0.9 % IV SOLN: INTRAVENOUS | Status: DC

## 2016-09-06 MED ORDER — ACETAMINOPHEN 325 MG PO TABS: 650.0000 mg | ORAL_TABLET | ORAL | Status: DC | PRN

## 2016-09-06 MED ORDER — DEXTROSE 50 % IV SOLN
INTRAVENOUS | Status: AC
  Administered 2016-09-06: 25 mL
  Filled 2016-09-06: qty 50

## 2016-09-06 MED ORDER — LIDOCAINE HCL 2 % IJ SOLN
INTRAMUSCULAR | Status: AC
  Filled 2016-09-06: qty 10

## 2016-09-06 MED ORDER — SODIUM CHLORIDE 0.9 % IV SOLN
250.0000 mL | INTRAVENOUS | Status: DC | PRN
  Administered 2016-09-06: 250 mL via INTRAVENOUS

## 2016-09-06 MED ORDER — IOPAMIDOL (ISOVUE-370) INJECTION 76%
INTRAVENOUS | Status: AC
  Filled 2016-09-06: qty 100

## 2016-09-06 MED ORDER — DIAZEPAM 5 MG PO TABS: 5.0000 mg | ORAL_TABLET | Freq: Four times a day (QID) | ORAL | Status: DC | PRN

## 2016-09-06 MED ORDER — ONDANSETRON HCL 4 MG/2ML IJ SOLN: 4.0000 mg | Freq: Four times a day (QID) | INTRAMUSCULAR | Status: DC | PRN

## 2016-09-06 SURGICAL SUPPLY — 12 items
CATH INFINITI 5FR MULTPACK ANG (CATHETERS) ×1 IMPLANT
CATH SWAN GANZ 7F STRAIGHT (CATHETERS) ×1 IMPLANT
COVER PRB 48X5XTLSCP FOLD TPE (BAG) IMPLANT
COVER PROBE 5X48 (BAG) ×2
KIT HEART LEFT (KITS) ×2 IMPLANT
PACK CARDIAC CATHETERIZATION (CUSTOM PROCEDURE TRAY) ×2 IMPLANT
SHEATH PINNACLE 5F 10CM (SHEATH) ×1 IMPLANT
SHEATH PINNACLE 7F 10CM (SHEATH) ×1 IMPLANT
TRANSDUCER W/STOPCOCK (MISCELLANEOUS) ×3 IMPLANT
TUBING CIL FLEX 10 FLL-RA (TUBING) ×2 IMPLANT
WIRE EMERALD 3MM-J .025X260CM (WIRE) ×1 IMPLANT
WIRE EMERALD 3MM-J .035X150CM (WIRE) ×1 IMPLANT

## 2016-09-06 NOTE — H&P (View-Only) (Signed)
Progress Note  Patient Name: [name] Date of Encounter: 09/06/2016  Primary Cardiologist: [name]  Subjective   His angina with shakes of head. Able to lie completely flat in bed now, without dyspnea. Looks comfortable. Excellent urine output roughly 2.3 L negative since admission. Renal function has improved with diuresis. Generally has excellent rate control at rest, heart rate increases to the 110-120s when using the restroom.  Echo shows severely depressed left ventricular systolic function, EF 20-25%. There is a report of anteroseptal hypokinesis. Right ventricular systolic function is also mildly decreased. No significant valvular abnormalities. Estimated systolic PA pressure 49 mmHg.  Inpatient Medications    Scheduled Meds: . aspirin EC  81 mg Oral Daily  . furosemide  40 mg Intravenous BID  . glipiZIDE  10 mg Oral Q breakfast  . insulin aspart  0-15 Units Subcutaneous TID WC  . loratadine  10 mg Oral Daily  . metFORMIN  1,000 mg Oral BID WC  . multivitamin with minerals  1 tablet Oral Daily  . omega-3 acid ethyl esters  1 g Oral Daily  . simvastatin  40 mg Oral Daily   Continuous Infusions: . heparin 1,000 Units/hr (09/05/16 2300)   PRN Meds: acetaminophen, nitroGLYCERIN, ondansetron (ZOFRAN) IV   Vital Signs    Vitals:   09/05/16 0505 09/05/16 1500 09/05/16 2106 09/06/16 0528  BP: 121/85 122/72 120/70 130/85  Pulse: (!) 125 88 85 63  Resp: 18  17 18   Temp: 97.6 F (36.4 C) 97.6 F (36.4 C) 98.4 F (36.9 C) 98.1 F (36.7 C)  TempSrc: Oral Oral Oral Oral  SpO2: 99% 99% 100% 100%  Weight: 156 lb (70.8 kg)     Height:        Intake/Output Summary (Last 24 hours) at 09/06/16 0902 Last data filed at 09/06/16 0962  Gross per 24 hour  Intake           349.66 ml  Output             1450 ml  Net         -1100.34 ml   Filed Weights   09/04/16 2100 09/05/16 0121 09/05/16 0505  Weight: 190 lb (86.2 kg) 156 lb (70.8 kg) 156 lb (70.8 kg)     Telemetry    Atypical atrial flutter/coarse atrial fibrillation with mostly controlled ventricular rate - Personally Reviewed  ECG    ECG still looks like fairly coherent atrial flutter, albeit with atypical flutter wave morphology and very fast rate, consider left atrial flutter - Personally Reviewed  Physical Exam  Expressive aphasia, but seems to have normal understanding of speech GEN: No acute distress.   Neck: No JVD Cardiac:  irregular, no murmurs, rubs, or gallops.  Respiratory: Clear to auscultation bilaterally. GI: Soft, nontender, non-distended  MS: No edema; No deformity. Neuro:   right hemiparesis (moderate upper extremity, mild lower extremity) Psych: Normal affect   Labs    Chemistry Recent Labs Lab 09/04/16 1821 09/05/16 0150  NA 139 139  K 4.3 3.7  CL 107 109  CO2 22 21*  GLUCOSE 142* 117*  BUN 13 15  CREATININE 1.33* 1.18  CALCIUM 9.5 9.1  GFRNONAA 57* >60  GFRAA >60 >60  ANIONGAP 10 9     Hematology Recent Labs Lab 09/04/16 1821 09/05/16 0150 09/06/16 0245  WBC 8.6 8.4 8.7  RBC 4.93 4.87 5.08  HGB 14.2 14.1 14.9  HCT 43.5 43.0 44.8  MCV 88.2 88.3 88.2  MCH 28.8 29.0 29.3  MCHC 32.6 32.8 33.3  RDW 15.1 15.1 15.0  PLT 235 225 254    Cardiac Enzymes Recent Labs Lab 09/05/16 0150  TROPONINI 0.12*    Recent Labs Lab 09/04/16 1839  TROPIPOC 0.13*     BNP Recent Labs Lab 09/04/16 2101  BNP 696.7*     DDimer No results for input(s): DDIMER in the last 168 hours.   Radiology    Dg Chest 2 View  Result Date: 09/04/2016 CLINICAL DATA:  Increased exertional shortness of breath for 4-5 days, history hypertension, diabetes mellitus EXAM: CHEST  2 VIEW COMPARISON:  08/06/2012 FINDINGS: Enlargement of cardiac silhouette. Mediastinal contours and pulmonary vascularity normal. RIGHT basilar subsegmental atelectasis. Lungs otherwise clear. No infiltrate, pleural effusion or pneumothorax. Bones unremarkable. IMPRESSION: Enlargement  of cardiac silhouette. RIGHT basilar subsegmental atelectasis. Electronically Signed   By: Ulyses Southward M.D.   On: 09/04/2016 19:00    Cardiac Studies  Echo 09/05/2016 - Left ventricle: The cavity size was normal. Wall thickness was   increased in a pattern of moderate LVH. Systolic function was   severely reduced. The estimated ejection fraction was in the   range of 20% to 25%. Diffuse hypokinesis. Akinesis of the   anteroseptal myocardium. - Aortic valve: Transvalvular velocity was within the normal range.   There was no stenosis. There was no regurgitation. - Mitral valve: Transvalvular velocity was within the normal range.   There was no evidence for stenosis. There was trivial   regurgitation. - Left atrium: The atrium was severely dilated. - Right ventricle: The cavity size was normal. Wall thickness was   normal. Systolic function was mildly reduced. - Right atrium: The atrium was severely dilated. - Atrial septum: No defect or patent foramen ovale was identified   by color flow Doppler. - Tricuspid valve: There was mild regurgitation. - Pulmonary arteries: Systolic pressure was moderately increased.   PA peak pressure: 49 mm Hg (S).  Patient Profile     61 y.o. male with newly diagnosed heart failure/severe left ventricular systolic dysfunction and atypical atrial flutter with rapid ventricular response on a background of hyperlipidemia and type 2 diabetes mellitus.  Assessment & Plan    1. CHF: Differential diagnosis includes multivessel CAD and tachycardia-related cardiomyopathy. He does not have angina pectoris or Q waves on ECG, but he does have coronary risk factors. Scheduled for coronary angiography today. I doubt that his coronary anatomy will lead to angioplasty/stent. He will have either medical disease or need for bypass surgery. Suture was discussed in detail both with the patient and his niece, Gabriel Rainwater. I believe that the patient is competent to provide consent,  although he has limited ability to communicate due to his stroke related expressive aphasia. He agreed to the procedure. His niece agreed as well. The procedure has been fully reviewed with the patient and written informed consent has been obtained. Delay initiation of RAAS inhibitors until after catheterization. He is probably a good Entresto candidate. 2. AFlutter: The ECG still looks like a very consistent flutter-like activity, possible left atrial flutter. On telemetry the atrial wave morphology at times seems to deteriorate and is more consistent with atrial fibrillation. Rate control is fair if not perfect. He had bradycardia when intravenous diltiazem was given in the emergency room. Prefer carvedilol.  If he does not have coronary disease and this is felt to be tachycardia-related cardiomyopathy, will need aggressive maintenance of normal ventricular rates. Consider cardioversion after 3 weeks of anticoagulation, however he has severe biatrial dilation  and long-term maintenance of normal rhythm may be a big challenge. Start DOAC after catheterization. 3. DM: Good control with A1c 6.6%. He was on monotherapy with metformin as an outpatient. He is lean. 4. 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 Retrospectively, his remote stroke might have been related to asymptomatic atrial flutter/fibrillation.  Signed, Thurmon Fair, MD  09/06/2016, 9:02 AM

## 2016-09-06 NOTE — Progress Notes (Signed)
Progress Note  Patient Name: [name] Date of Encounter: 09/06/2016  Primary Cardiologist: [name]  Subjective   His angina with shakes of head. Able to lie completely flat in bed now, without dyspnea. Looks comfortable. Excellent urine output roughly 2.3 L negative since admission. Renal function has improved with diuresis. Generally has excellent rate control at rest, heart rate increases to the 110-120s when using the restroom.  Echo shows severely depressed left ventricular systolic function, EF 20-25%. There is a report of anteroseptal hypokinesis. Right ventricular systolic function is also mildly decreased. No significant valvular abnormalities. Estimated systolic PA pressure 49 mmHg.  Inpatient Medications    Scheduled Meds: . aspirin EC  81 mg Oral Daily  . furosemide  40 mg Intravenous BID  . glipiZIDE  10 mg Oral Q breakfast  . insulin aspart  0-15 Units Subcutaneous TID WC  . loratadine  10 mg Oral Daily  . metFORMIN  1,000 mg Oral BID WC  . multivitamin with minerals  1 tablet Oral Daily  . omega-3 acid ethyl esters  1 g Oral Daily  . simvastatin  40 mg Oral Daily   Continuous Infusions: . heparin 1,000 Units/hr (09/05/16 2300)   PRN Meds: acetaminophen, nitroGLYCERIN, ondansetron (ZOFRAN) IV   Vital Signs    Vitals:   09/05/16 0505 09/05/16 1500 09/05/16 2106 09/06/16 0528  BP: 121/85 122/72 120/70 130/85  Pulse: (!) 125 88 85 63  Resp: 18  17 18   Temp: 97.6 F (36.4 C) 97.6 F (36.4 C) 98.4 F (36.9 C) 98.1 F (36.7 C)  TempSrc: Oral Oral Oral Oral  SpO2: 99% 99% 100% 100%  Weight: 156 lb (70.8 kg)     Height:        Intake/Output Summary (Last 24 hours) at 09/06/16 0902 Last data filed at 09/06/16 0962  Gross per 24 hour  Intake           349.66 ml  Output             1450 ml  Net         -1100.34 ml   Filed Weights   09/04/16 2100 09/05/16 0121 09/05/16 0505  Weight: 190 lb (86.2 kg) 156 lb (70.8 kg) 156 lb (70.8 kg)     Telemetry    Atypical atrial flutter/coarse atrial fibrillation with mostly controlled ventricular rate - Personally Reviewed  ECG    ECG still looks like fairly coherent atrial flutter, albeit with atypical flutter wave morphology and very fast rate, consider left atrial flutter - Personally Reviewed  Physical Exam  Expressive aphasia, but seems to have normal understanding of speech GEN: No acute distress.   Neck: No JVD Cardiac:  irregular, no murmurs, rubs, or gallops.  Respiratory: Clear to auscultation bilaterally. GI: Soft, nontender, non-distended  MS: No edema; No deformity. Neuro:   right hemiparesis (moderate upper extremity, mild lower extremity) Psych: Normal affect   Labs    Chemistry Recent Labs Lab 09/04/16 1821 09/05/16 0150  NA 139 139  K 4.3 3.7  CL 107 109  CO2 22 21*  GLUCOSE 142* 117*  BUN 13 15  CREATININE 1.33* 1.18  CALCIUM 9.5 9.1  GFRNONAA 57* >60  GFRAA >60 >60  ANIONGAP 10 9     Hematology Recent Labs Lab 09/04/16 1821 09/05/16 0150 09/06/16 0245  WBC 8.6 8.4 8.7  RBC 4.93 4.87 5.08  HGB 14.2 14.1 14.9  HCT 43.5 43.0 44.8  MCV 88.2 88.3 88.2  MCH 28.8 29.0 29.3  MCHC 32.6 32.8 33.3  RDW 15.1 15.1 15.0  PLT 235 225 254    Cardiac Enzymes Recent Labs Lab 09/05/16 0150  TROPONINI 0.12*    Recent Labs Lab 09/04/16 1839  TROPIPOC 0.13*     BNP Recent Labs Lab 09/04/16 2101  BNP 696.7*     DDimer No results for input(s): DDIMER in the last 168 hours.   Radiology    Dg Chest 2 View  Result Date: 09/04/2016 CLINICAL DATA:  Increased exertional shortness of breath for 4-5 days, history hypertension, diabetes mellitus EXAM: CHEST  2 VIEW COMPARISON:  08/06/2012 FINDINGS: Enlargement of cardiac silhouette. Mediastinal contours and pulmonary vascularity normal. RIGHT basilar subsegmental atelectasis. Lungs otherwise clear. No infiltrate, pleural effusion or pneumothorax. Bones unremarkable. IMPRESSION: Enlargement  of cardiac silhouette. RIGHT basilar subsegmental atelectasis. Electronically Signed   By: Ulyses Southward M.D.   On: 09/04/2016 19:00    Cardiac Studies  Echo 09/05/2016 - Left ventricle: The cavity size was normal. Wall thickness was   increased in a pattern of moderate LVH. Systolic function was   severely reduced. The estimated ejection fraction was in the   range of 20% to 25%. Diffuse hypokinesis. Akinesis of the   anteroseptal myocardium. - Aortic valve: Transvalvular velocity was within the normal range.   There was no stenosis. There was no regurgitation. - Mitral valve: Transvalvular velocity was within the normal range.   There was no evidence for stenosis. There was trivial   regurgitation. - Left atrium: The atrium was severely dilated. - Right ventricle: The cavity size was normal. Wall thickness was   normal. Systolic function was mildly reduced. - Right atrium: The atrium was severely dilated. - Atrial septum: No defect or patent foramen ovale was identified   by color flow Doppler. - Tricuspid valve: There was mild regurgitation. - Pulmonary arteries: Systolic pressure was moderately increased.   PA peak pressure: 49 mm Hg (S).  Patient Profile     61 y.o. male with newly diagnosed heart failure/severe left ventricular systolic dysfunction and atypical atrial flutter with rapid ventricular response on a background of hyperlipidemia and type 2 diabetes mellitus.  Assessment & Plan    1. CHF: Differential diagnosis includes multivessel CAD and tachycardia-related cardiomyopathy. He does not have angina pectoris or Q waves on ECG, but he does have coronary risk factors. Scheduled for coronary angiography today. I doubt that his coronary anatomy will lead to angioplasty/stent. He will have either medical disease or need for bypass surgery. Suture was discussed in detail both with the patient and his niece, Gabriel Rainwater. I believe that the patient is competent to provide consent,  although he has limited ability to communicate due to his stroke related expressive aphasia. He agreed to the procedure. His niece agreed as well. The procedure has been fully reviewed with the patient and written informed consent has been obtained. Delay initiation of RAAS inhibitors until after catheterization. He is probably a good Entresto candidate. 2. AFlutter: The ECG still looks like a very consistent flutter-like activity, possible left atrial flutter. On telemetry the atrial wave morphology at times seems to deteriorate and is more consistent with atrial fibrillation. Rate control is fair if not perfect. He had bradycardia when intravenous diltiazem was given in the emergency room. Prefer carvedilol.  If he does not have coronary disease and this is felt to be tachycardia-related cardiomyopathy, will need aggressive maintenance of normal ventricular rates. Consider cardioversion after 3 weeks of anticoagulation, however he has severe biatrial dilation  and long-term maintenance of normal rhythm may be a big challenge. Start DOAC after catheterization. 3. DM: Good control with A1c 6.6%. He was on monotherapy with metformin as an outpatient. He is lean. 4. 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 Retrospectively, his remote stroke might have been related to asymptomatic atrial flutter/fibrillation.  Signed, Thurmon Fair, MD  09/06/2016, 9:02 AM

## 2016-09-06 NOTE — Progress Notes (Signed)
ANTICOAGULATION CONSULT NOTE - Follow Up Consult  Pharmacy Consult for Heparin Indication: aflutter  No Known Allergies  Patient Measurements: Height: 6' (182.9 cm) Weight: 156 lb (70.8 kg) IBW/kg (Calculated) : 77.6 Heparin Dosing Weight:  70.8 kg  Vital Signs: Temp: 98.3 F (36.8 C) (08/31 1300) Temp Source: Oral (08/31 1300) BP: 117/79 (08/31 1836) Pulse Rate: 55 (08/31 1836)  Labs:  Recent Labs  09/04/16 1821 09/05/16 0150  09/05/16 1804 09/06/16 0245 09/06/16 1016 09/06/16 1529  HGB 14.2 14.1  --   --  14.9  --   --   HCT 43.5 43.0  --   --  44.8  --   --   PLT 235 225  --   --  254  --   --   LABPROT  --   --   --   --   --  12.9  --   INR  --   --   --   --   --  0.99  --   HEPARINUNFRC  --  0.74*  < > 0.28* 0.29*  --  0.99*  CREATININE 1.33* 1.18  --   --   --  1.05  --   TROPONINI  --  0.12*  --   --   --   --   --   < > = values in this interval not displayed.  Estimated Creatinine Clearance: 74.9 mL/min (by C-G formula based on SCr of 1.05 mg/dL).   Assessment:  Anticoag: Hep for new onset aflutter with h/o CVA.  - 8/31: HL 0.29 low, CBC remains stable and WNL. Still may need cath, then DOAC for TEE-DCCV. Recheck heparin level this afternoon 0.99 high.  PM f/u> pt now s/p cath, pharmacy asked to resume IV heparin 10 hrs after sheath removed (pulled at 1815 pm), no overt bleeding or complications noted.  Goal of Therapy:  Heparin level 0.3-0.7 units/ml Monitor platelets by anticoagulation protocol: Yes   Plan:  Resume heparin at 1050 units/hr at 0430 AM tomorrow. Check heparin level 6 hrs after gtt resumes. Daily heparin level and CBC.  Tad Moore, BCPS  Clinical Pharmacist Pager [phone]  09/06/2016 6:45 PM

## 2016-09-06 NOTE — Progress Notes (Signed)
ANTICOAGULATION CONSULT NOTE - Follow Up Consult  Pharmacy Consult for Heparin Indication: aflutter  No Known Allergies  Patient Measurements: Height: 6' (182.9 cm) Weight: 156 lb (70.8 kg) IBW/kg (Calculated) : 77.6 Heparin Dosing Weight:  70.8 kg  Vital Signs: Temp: 98.3 F (36.8 C) (08/31 1300) Temp Source: Oral (08/31 1300) BP: 113/69 (08/31 1300) Pulse Rate: 64 (08/31 1300)  Labs:  Recent Labs  09/04/16 1821 09/05/16 0150  09/05/16 1804 09/06/16 0245 09/06/16 1016 09/06/16 1529  HGB 14.2 14.1  --   --  14.9  --   --   HCT 43.5 43.0  --   --  44.8  --   --   PLT 235 225  --   --  254  --   --   LABPROT  --   --   --   --   --  12.9  --   INR  --   --   --   --   --  0.99  --   HEPARINUNFRC  --  0.74*  < > 0.28* 0.29*  --  0.99*  CREATININE 1.33* 1.18  --   --   --  1.05  --   TROPONINI  --  0.12*  --   --   --   --   --   < > = values in this interval not displayed.  Estimated Creatinine Clearance: 74.9 mL/min (by C-G formula based on SCr of 1.05 mg/dL).   Assessment:  Anticoag: Hep for new onset aflutter with h/o CVA.  - 8/31: HL 0.29 low, CBC remains stable and WNL. Still may need cath, then DOAC for TEE-DCCV. Recheck heparin level this afternoon 0.99 high.  Goal of Therapy:  Heparin level 0.3-0.7 units/ml Monitor platelets by anticoagulation protocol: Yes   Plan:  Heparin already turned off for cath. Will f/u after cath Daily HL, CBC   Dhanush Jokerst S. Merilynn Finland, PharmD, BCPS Clinical Staff Pharmacist Pager [phone]  Misty Stanley Stillinger 09/06/2016,4:04 PM

## 2016-09-06 NOTE — Progress Notes (Signed)
ANTICOAGULATION CONSULT NOTE - Follow Up Consult  Pharmacy Consult for Heparin Indication: aflutter  No Known Allergies  Patient Measurements: Height: 6' (182.9 cm) Weight: 156 lb (70.8 kg) IBW/kg (Calculated) : 77.6 Heparin Dosing Weight:  70.8 kg  Vital Signs: Temp: 98.1 F (36.7 C) (08/31 0528) Temp Source: Oral (08/31 0528) BP: 130/85 (08/31 0528) Pulse Rate: 63 (08/31 0528)  Labs:  Recent Labs  09/04/16 1821  09/05/16 0150 09/05/16 0606 09/05/16 1804 09/06/16 0245  HGB 14.2  --  14.1  --   --  14.9  HCT 43.5  --  43.0  --   --  44.8  PLT 235  --  225  --   --  254  HEPARINUNFRC  --   < > 0.74* 1.18* 0.28* 0.29*  CREATININE 1.33*  --  1.18  --   --   --   TROPONINI  --   --  0.12*  --   --   --   < > = values in this interval not displayed.  Estimated Creatinine Clearance: 66.7 mL/min (by C-G formula based on SCr of 1.18 mg/dL).   Assessment:  Anticoag: Hep for new onset aflutter with h/o CVA.  - 8/31: HL 0.29 low, CBC remains stable and WNL. Still may need cath, then DOAC for TEE-DCCV.  Goal of Therapy:  Heparin level 0.3-0.7 units/ml Monitor platelets by anticoagulation protocol: Yes   Plan:  Increase IV heparin to 1150 units/hr this AM Recheck level in 6 hrs. Daily HL, CBC    Littleton Haub S. Merilynn Finland, PharmD, BCPS Clinical Staff Pharmacist Pager [phone]  Misty Stanley Stillinger 09/06/2016,8:29 AM

## 2016-09-06 NOTE — Progress Notes (Signed)
64fr sheath aspirated and removed from rfv. Manual pressure applied for 5 minutes, then 14fr arterial sheath was aspirated and removed. Manual pressure applied over both sites for additional 20 minutes. Groin level 0 bilateral dp pulses weak but palpable. Tegaderm dressing applied, bedrest instructions given.   Bedrest begins at 18:15:00

## 2016-09-06 NOTE — Interval H&P Note (Signed)
Cath Lab Visit (complete for each Cath Lab visit)  Clinical Evaluation Leading to the Procedure:   ACS: No.  Non-ACS:    Anginal Classification: CCS III  Anti-ischemic medical therapy: No Therapy  Non-Invasive Test Results: No non-invasive testing performed  Prior CABG: No previous CABG      History and Physical Interval Note:  09/06/2016 4:37 PM  Sheppard Faciane  has presented today for surgery, with the diagnosis of low ef - hf  The various methods of treatment have been discussed with the patient and family. After consideration of risks, benefits and other options for treatment, the patient has consented to  Procedure(s): RIGHT/LEFT HEART CATH AND CORONARY ANGIOGRAPHY (N/A) as a surgical intervention .  The patient's history has been reviewed, patient examined, no change in status, stable for surgery.  I have reviewed the patient's chart and labs.  Questions were answered to the patient's satisfaction.     Nicki Guadalajara

## 2016-09-06 NOTE — Plan of Care (Signed)
Problem: Activity: Goal: Ability to tolerate increased activity will improve Outcome: Completed/Met Date Met: 09/06/16 Ambulates in room with cane with assistance.  Activity level at baseline.  Problem: Cardiac: Goal: Ability to achieve and maintain adequate cardiopulmonary perfusion will improve Outcome: Progressing VSS.  Problem: Safety: Goal: Ability to remain free from injury will improve Outcome: Progressing Safety measures in place, including: bed alarm on, personal items and call light within reach, pt understands to call staff for assistance to get out of bed.  Problem: Pain Managment: Goal: General experience of comfort will improve Outcome: Progressing Denies c/o pain or discomfort.

## 2016-09-07 DIAGNOSIS — I5021 Acute systolic (congestive) heart failure: Secondary | ICD-10-CM | POA: Diagnosis present

## 2016-09-07 LAB — BASIC METABOLIC PANEL
ANION GAP: 9 (ref 5–15)
BUN: 12 mg/dL (ref 6–20)
CO2: 23 mmol/L (ref 22–32)
Calcium: 9.1 mg/dL (ref 8.9–10.3)
Chloride: 104 mmol/L (ref 101–111)
Creatinine, Ser: 0.91 mg/dL (ref 0.61–1.24)
GFR calc non Af Amer: >60 mL/min (ref >60)
GLUCOSE: 117 mg/dL — AB (ref 65–99)
Potassium: 3.7 mmol/L (ref 3.5–5.1)
SODIUM: 136 mmol/L (ref 135–145)

## 2016-09-07 LAB — CBC
HEMATOCRIT: 43.6 % (ref 39.0–52.0)
Hemoglobin: 14.8 g/dL (ref 13.0–17.0)
MCH: 29.2 pg (ref 26.0–34.0)
MCHC: 33.9 g/dL (ref 30.0–36.0)
MCV: 86.2 fL (ref 78.0–100.0)
Platelets: 246 10*3/uL (ref 150–400)
RBC: 5.06 MIL/uL (ref 4.22–5.81)
RDW: 14.6 % (ref 11.5–15.5)
WBC: 8.9 10*3/uL (ref 4.0–10.5)

## 2016-09-07 LAB — MAGNESIUM: Magnesium: 1.9 mg/dL (ref 1.7–2.4)

## 2016-09-07 LAB — GLUCOSE, CAPILLARY
GLUCOSE-CAPILLARY: 71 mg/dL (ref 65–99)
Glucose-Capillary: 98 mg/dL (ref 65–99)
Glucose-Capillary: 105 mg/dL — ABNORMAL HIGH (ref 65–99)
Glucose-Capillary: 93 mg/dL (ref 65–99)

## 2016-09-07 MED ORDER — APIXABAN 5 MG PO TABS
5.0000 mg | ORAL_TABLET | Freq: Two times a day (BID) | ORAL | Status: DC
  Administered 2016-09-07 – 2016-09-09 (×5): 5 mg via ORAL
  Filled 2016-09-07 (×5): qty 1

## 2016-09-07 MED ORDER — POTASSIUM CHLORIDE CRYS ER 20 MEQ PO TBCR
20.0000 meq | EXTENDED_RELEASE_TABLET | Freq: Once | ORAL | Status: AC
  Administered 2016-09-07: 20 meq via ORAL
  Filled 2016-09-07: qty 1

## 2016-09-07 MED ORDER — MAGNESIUM HYDROXIDE 400 MG/5ML PO SUSP
30.0000 mL | Freq: Every evening | ORAL | Status: DC | PRN
  Administered 2016-09-07: 30 mL via ORAL
  Filled 2016-09-07: qty 30

## 2016-09-07 MED ORDER — FUROSEMIDE 20 MG PO TABS
20.0000 mg | ORAL_TABLET | Freq: Every day | ORAL | Status: DC
  Administered 2016-09-08: 20 mg via ORAL
  Filled 2016-09-07: qty 1

## 2016-09-07 MED ORDER — SACUBITRIL-VALSARTAN 24-26 MG PO TABS
1.0000 | ORAL_TABLET | Freq: Two times a day (BID) | ORAL | Status: DC
  Administered 2016-09-07 – 2016-09-09 (×4): 1 via ORAL
  Filled 2016-09-07 (×4): qty 1

## 2016-09-07 NOTE — Progress Notes (Signed)
DAILY PROGRESS NOTE   Patient Name: [name] Date of Encounter: 09/07/2016  Hospital Problem List   Active Problems:   Atrial flutter ([place])   Acute systolic (congestive) heart failure [facility])    Chief Complaint   Breathing is good.  Subjective   Cath reviewed yesterday - he has CAD, but no significant obstruction. LVEF reduced out of proportion to findings, c/w NICM - possibly related to a-fib. Plan to start [place]. He is a good candidate for [employer]. He is 3L negative overnight on IV lasix 40 mg BID. BNP 696. LVEDP yesterday was only 9 mmHg. Remains in rate-controlled a-fib.  Objective   Vitals:   09/06/16 1836 09/06/16 2051 09/07/16 0300 09/07/16 0908  BP: 117/79 113/73 119/87 119/79  Pulse: (!) 55 60 87   Resp:  16 (!) 158   Temp:  98 F (36.7 C) 98.2 F (36.8 C)   TempSrc:  Oral Oral   SpO2: 99% 96% 96%   Weight:   153 lb 14.4 oz (69.8 kg)   Height:        Intake/Output Summary (Last 24 hours) at 09/07/16 1140 Last data filed at 09/07/16 0954  Gross per 24 hour  Intake              120 ml  Output              650 ml  Net             -530 ml   Filed Weights   09/05/16 0121 09/05/16 0505 09/07/16 0300  Weight: 156 lb (70.8 kg) 156 lb (70.8 kg) 153 lb 14.4 oz (69.8 kg)    Physical Exam   General appearance: alert and no distress Neck: no carotid bruit, no JVD and thyroid not enlarged, symmetric, no tenderness/mass/nodules Lungs: clear to auscultation bilaterally Heart: irregularly irregular rhythm Abdomen: soft, non-tender; bowel sounds normal; no masses,  no organomegaly Extremities: extremities normal, atraumatic, no cyanosis or edema Pulses: 2+ and symmetric Skin: Skin color, texture, turgor normal. No rashes or lesions Neurologic: Mental status: Awake, mild expressive aphasia (old from stroke) Psych: Pleasant  Inpatient Medications    Scheduled Meds: . apixaban  5 mg Oral BID  . aspirin EC  81 mg Oral Daily  . furosemide  40 mg Intravenous  BID  . glipiZIDE  10 mg Oral Q breakfast  . insulin aspart  0-15 Units Subcutaneous TID WC  . loratadine  10 mg Oral Daily  . metFORMIN  1,000 mg Oral BID WC  . multivitamin with minerals  1 tablet Oral Daily  . omega-3 acid ethyl esters  1 g Oral Daily  . simvastatin  40 mg Oral Daily  . sodium chloride flush  3 mL Intravenous Q12H    Continuous Infusions: . sodium chloride      PRN Meds: sodium chloride, acetaminophen, diazepam, magnesium hydroxide, nitroGLYCERIN, ondansetron (ZOFRAN) IV, sodium chloride flush   Labs   Results for orders placed or performed during the hospital encounter of 09/04/16 (from the past 48 hour(s))  Glucose, capillary     Status: Abnormal   Collection Time: 09/05/16 11:59 AM  Result Value Ref Range   Glucose-Capillary 156 (H) 65 - 99 mg/dL  Glucose, capillary     Status: Abnormal   Collection Time: 09/05/16  4:31 PM  Result Value Ref Range   Glucose-Capillary 121 (H) 65 - 99 mg/dL  Heparin level (unfractionated)     Status: Abnormal   Collection Time: 09/05/16  6:04  PM  Result Value Ref Range   Heparin Unfractionated 0.28 (L) 0.30 - 0.70 IU/mL    Comment:        IF HEPARIN RESULTS ARE BELOW EXPECTED VALUES, AND PATIENT DOSAGE HAS BEEN CONFIRMED, SUGGEST FOLLOW UP TESTING OF ANTITHROMBIN III LEVELS.   Glucose, capillary     Status: Abnormal   Collection Time: 09/05/16  9:08 PM  Result Value Ref Range   Glucose-Capillary 100 (H) 65 - 99 mg/dL  Heparin level (unfractionated)     Status: Abnormal   Collection Time: 09/06/16  2:45 AM  Result Value Ref Range   Heparin Unfractionated 0.29 (L) 0.30 - 0.70 IU/mL    Comment:        IF HEPARIN RESULTS ARE BELOW EXPECTED VALUES, AND PATIENT DOSAGE HAS BEEN CONFIRMED, SUGGEST FOLLOW UP TESTING OF ANTITHROMBIN III LEVELS.   CBC     Status: None   Collection Time: 09/06/16  2:45 AM  Result Value Ref Range   WBC 8.7 4.0 - 10.5 K/uL   RBC 5.08 4.22 - 5.81 MIL/uL   Hemoglobin 14.9 13.0 - 17.0  g/dL   HCT 44.8 39.0 - 52.0 %   MCV 88.2 78.0 - 100.0 fL   MCH 29.3 26.0 - 34.0 pg   MCHC 33.3 30.0 - 36.0 g/dL   RDW 15.0 11.5 - 15.5 %   Platelets 254 150 - 400 K/uL  Glucose, capillary     Status: Abnormal   Collection Time: 09/06/16  7:46 AM  Result Value Ref Range   Glucose-Capillary 149 (H) 65 - 99 mg/dL  Basic metabolic panel     Status: None   Collection Time: 09/06/16 10:16 AM  Result Value Ref Range   Sodium 141 135 - 145 mmol/L   Potassium 3.7 3.5 - 5.1 mmol/L   Chloride 102 101 - 111 mmol/L   CO2 28 22 - 32 mmol/L   Glucose, Bld 69 65 - 99 mg/dL   BUN 15 6 - 20 mg/dL   Creatinine, Ser 1.05 0.61 - 1.24 mg/dL   Calcium 10.1 8.9 - 10.3 mg/dL   GFR calc non Af Amer >60 >60 mL/min   GFR calc Af Amer >60 >60 mL/min    Comment: (NOTE) The eGFR has been calculated using the CKD EPI equation. This calculation has not been validated in all clinical situations. eGFR's persistently <60 mL/min signify possible Chronic Kidney Disease.    Anion gap 11 5 - 15  Protime-INR     Status: None   Collection Time: 09/06/16 10:16 AM  Result Value Ref Range   Prothrombin Time 12.9 11.4 - 15.2 seconds   INR 0.99   Glucose, capillary     Status: Abnormal   Collection Time: 09/06/16 11:23 AM  Result Value Ref Range   Glucose-Capillary 45 (L) 65 - 99 mg/dL  Glucose, capillary     Status: Abnormal   Collection Time: 09/06/16 11:57 AM  Result Value Ref Range   Glucose-Capillary 123 (H) 65 - 99 mg/dL  Heparin level (unfractionated)     Status: Abnormal   Collection Time: 09/06/16  3:29 PM  Result Value Ref Range   Heparin Unfractionated 0.99 (H) 0.30 - 0.70 IU/mL    Comment:        IF HEPARIN RESULTS ARE BELOW EXPECTED VALUES, AND PATIENT DOSAGE HAS BEEN CONFIRMED, SUGGEST FOLLOW UP TESTING OF ANTITHROMBIN III LEVELS.   I-STAT 3, venous blood gas (G3P V)     Status: Abnormal   Collection Time: 09/06/16  4:59 PM  Result Value Ref Range   pH, Ven 7.406 7.250 - 7.430   pCO2, Ven  40.6 (L) 44.0 - 60.0 mmHg   pO2, Ven 35.0 32.0 - 45.0 mmHg   Bicarbonate 25.5 20.0 - 28.0 mmol/L   TCO2 27 22 - 32 mmol/L   O2 Saturation 68.0 %   Acid-Base Excess 1.0 0.0 - 2.0 mmol/L   Patient temperature HIDE    Sample type VENOUS    Comment NOTIFIED PHYSICIAN   I-STAT 3, arterial blood gas (G3+)     Status: Abnormal   Collection Time: 09/06/16  5:19 PM  Result Value Ref Range   pH, Arterial 7.425 7.350 - 7.450   pCO2 arterial 37.9 32.0 - 48.0 mmHg   pO2, Arterial 75.0 (L) 83.0 - 108.0 mmHg   Bicarbonate 24.9 20.0 - 28.0 mmol/L   TCO2 26 22 - 32 mmol/L   O2 Saturation 95.0 %   Acid-Base Excess 1.0 0.0 - 2.0 mmol/L   Patient temperature HIDE    Sample type ARTERIAL   POCT Activated clotting time     Status: None   Collection Time: 09/06/16  5:28 PM  Result Value Ref Range   Activated Clotting Time 98 seconds  Glucose, capillary     Status: Abnormal   Collection Time: 09/06/16  6:46 PM  Result Value Ref Range   Glucose-Capillary 51 (L) 65 - 99 mg/dL  Glucose, capillary     Status: Abnormal   Collection Time: 09/06/16  7:23 PM  Result Value Ref Range   Glucose-Capillary 105 (H) 65 - 99 mg/dL  Glucose, capillary     Status: Abnormal   Collection Time: 09/06/16  8:50 PM  Result Value Ref Range   Glucose-Capillary 138 (H) 65 - 99 mg/dL   Comment 1 Notify RN   Glucose, capillary     Status: Abnormal   Collection Time: 09/06/16 11:39 PM  Result Value Ref Range   Glucose-Capillary 143 (H) 65 - 99 mg/dL  CBC     Status: None   Collection Time: 09/07/16  3:14 AM  Result Value Ref Range   WBC 8.9 4.0 - 10.5 K/uL   RBC 5.06 4.22 - 5.81 MIL/uL   Hemoglobin 14.8 13.0 - 17.0 g/dL   HCT 43.6 39.0 - 52.0 %   MCV 86.2 78.0 - 100.0 fL   MCH 29.2 26.0 - 34.0 pg   MCHC 33.9 30.0 - 36.0 g/dL   RDW 14.6 11.5 - 15.5 %   Platelets 246 150 - 400 K/uL  Basic metabolic panel     Status: Abnormal   Collection Time: 09/07/16  3:14 AM  Result Value Ref Range   Sodium 136 135 - 145 mmol/L     Potassium 3.7 3.5 - 5.1 mmol/L   Chloride 104 101 - 111 mmol/L   CO2 23 22 - 32 mmol/L   Glucose, Bld 117 (H) 65 - 99 mg/dL   BUN 12 6 - 20 mg/dL   Creatinine, Ser 0.91 0.61 - 1.24 mg/dL   Calcium 9.1 8.9 - 10.3 mg/dL   GFR calc non Af Amer >60 >60 mL/min   GFR calc Af Amer >60 >60 mL/min    Comment: (NOTE) The eGFR has been calculated using the CKD EPI equation. This calculation has not been validated in all clinical situations. eGFR's persistently <60 mL/min signify possible Chronic Kidney Disease.    Anion gap 9 5 - 15  Magnesium     Status: None   Collection Time:  09/07/16  3:14 AM  Result Value Ref Range   Magnesium 1.9 1.7 - 2.4 mg/dL  Glucose, capillary     Status: None   Collection Time: 09/07/16  4:17 AM  Result Value Ref Range   Glucose-Capillary 98 65 - 99 mg/dL  Glucose, capillary     Status: Abnormal   Collection Time: 09/07/16  8:09 AM  Result Value Ref Range   Glucose-Capillary 105 (H) 65 - 99 mg/dL   Comment 1 Notify RN     ECG    N/A  Telemetry   A-fib with CVR - Personally Reviewed  Radiology    No results found.  Cardiac Studies   Conclusion     Ost LAD lesion, 20 %stenosed.  Prox LAD lesion, 35 %stenosed.  Ost 1st Diag lesion, 40 %stenosed.  Ost 2nd Diag to 2nd Diag lesion, 20 %stenosed.   Normal to very minimal elevation of right heart pressures.  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  RECOMMENDATION: Guideline directed medical therapy for the patient's nonischemic cardiomyopathy    Assessment   Active Problems:   Atrial flutter (H[facility])   Acute systolic (congestive) heart  failure ([place])   Plan   1. Mr. Gill is now in rate-controlled a-fib flutter. Appears euvolemic on exam. LVEDP yesterday was 9 mmHg. Will d/c IV lasix and transition to lasix 20 mg po daily tomorrow. Switch heparin to Eliquis 5 mg BID. Continue low dose aspirin for CAD and prior stroke. On simvastatin 40 mg - no recent lipid profile. LDL 80 in 2011 per care everywhere. Check outpatient lipid profile. Bradycardic with diltiazem in the ER - now rate-controlled, but not on AVN blockers, suspicious of conduction delay or sinus node dysfunction. Will pursue ARNI (Entresto 24/26 mg BID) now and hold on starting b-blocker given bradycardia.  Possible d/c home tomorrow.  Time Spent Directly with Patient:  I have spent a total of 25 minutes with the patient reviewing hospital notes, telemetry, EKGs, labs and examining the patient as well as establishing an assessment and plan that was discussed personally with the patient. > 50% of time was spent in direct patient care.  Length of Stay:  LOS: 3 days   Pixie Casino, MD, [place]  Attending Cardiologist  Direct Dial: [phone]  Fax: [phone]  Website:  [email or web address] 09/07/2016, 11:40 AM

## 2016-09-07 NOTE — Progress Notes (Signed)
Cardiology Fellows paged patient had 7 beats vtach.Ilean Skill LPN

## 2016-09-07 NOTE — Progress Notes (Signed)
Cardiology Fellows oncall patient had 2.42 pause and 5 beats Vtach bp 121/87 symptomatic Ilean Skill LPN

## 2016-09-08 DIAGNOSIS — I428 Other cardiomyopathies: Secondary | ICD-10-CM

## 2016-09-08 LAB — CBC
HCT: 45.9 % (ref 39.0–52.0)
Hemoglobin: 15.3 g/dL (ref 13.0–17.0)
MCH: 28.5 pg (ref 26.0–34.0)
MCHC: 33.3 g/dL (ref 30.0–36.0)
MCV: 85.6 fL (ref 78.0–100.0)
Platelets: 264 10*3/uL (ref 150–400)
RBC: 5.36 MIL/uL (ref 4.22–5.81)
RDW: 14.6 % (ref 11.5–15.5)
WBC: 8.4 10*3/uL (ref 4.0–10.5)

## 2016-09-08 LAB — CBC WITH DIFFERENTIAL/PLATELET
Basophils Absolute: 0 10*3/uL (ref 0.0–0.1)
Basophils Relative: 0 %
EOS ABS: 0.3 10*3/uL (ref 0.0–0.7)
EOS PCT: 3 %
HCT: 49.1 % (ref 39.0–52.0)
HEMOGLOBIN: 16.4 g/dL (ref 13.0–17.0)
LYMPHS ABS: 2.9 10*3/uL (ref 0.7–4.0)
Lymphocytes Relative: 29 %
MCH: 29.4 pg (ref 26.0–34.0)
MCHC: 33.4 g/dL (ref 30.0–36.0)
MCV: 88.2 fL (ref 78.0–100.0)
MONOS PCT: 8 %
Monocytes Absolute: 0.8 10*3/uL (ref 0.1–1.0)
NEUTROS PCT: 60 %
Neutro Abs: 6.2 10*3/uL (ref 1.7–7.7)
Platelets: 308 10*3/uL (ref 150–400)
RBC: 5.57 MIL/uL (ref 4.22–5.81)
RDW: 14.7 % (ref 11.5–15.5)
WBC: 10.2 10*3/uL (ref 4.0–10.5)

## 2016-09-08 LAB — GLUCOSE, CAPILLARY
GLUCOSE-CAPILLARY: 154 mg/dL — AB (ref 65–99)
Glucose-Capillary: 109 mg/dL — ABNORMAL HIGH (ref 65–99)
Glucose-Capillary: 86 mg/dL (ref 65–99)

## 2016-09-08 MED ORDER — CARVEDILOL 3.125 MG PO TABS
3.1250 mg | ORAL_TABLET | Freq: Two times a day (BID) | ORAL | Status: DC
  Administered 2016-09-08 – 2016-09-09 (×2): 3.125 mg via ORAL
  Filled 2016-09-08 (×2): qty 1

## 2016-09-08 NOTE — Care Management Note (Addendum)
Case Management Note  Patient Details  Name: Shahmeer Veneziale MRN: [id number] Date of Birth: 03-Mar-1955  Subjective/Objective:     Aflutter, CHF               Action/Plan: Discharge Planning: NCM spoke to niece, Gabriel Rainwater and explained trial cards and that [facility] has in stock .Referral for Eliquis card and Entresto. Unit RN was able to locate 30 day free trial cards for both medications. She will give to pt to use at his pharmacy. Contacted pt's [facility] and they do not have Entrestro in stock. Pt can [facility] at [facility] on [address] and use free trial cards with Rx. They do have in stock.  PCP HILL, GERALD  Expected Discharge Date:                 Expected Discharge Plan:  Home/Self Care  In-House Referral:  NA  Discharge planning Services  CM Consult, Medication Assistance  Post Acute Care Choice:  NA Choice offered to:  NA  DME Arranged:  N/A DME Agency:  NA  HH Arranged:  NA HH Agency:  NA  Status of Service:  Completed, signed off  If discussed at Long Length of Stay Meetings, dates discussed:    Additional Comments:  Elliot Cousin, RN 09/08/2016, 11:48 AM

## 2016-09-08 NOTE — Progress Notes (Signed)
Progress Note  Patient Name: [name] Date of Encounter: 09/08/2016  Primary Cardiologist: Dr. Sanjuana Kava  Subjective   Feels better; no chest pain or resting dyspnea.  Inpatient Medications    Scheduled Meds: . apixaban  5 mg Oral BID  . aspirin EC  81 mg Oral Daily  . furosemide  20 mg Oral Daily  . glipiZIDE  10 mg Oral Q breakfast  . insulin aspart  0-15 Units Subcutaneous TID WC  . loratadine  10 mg Oral Daily  . metFORMIN  1,000 mg Oral BID WC  . multivitamin with minerals  1 tablet Oral Daily  . omega-3 acid ethyl esters  1 g Oral Daily  . sacubitril-valsartan  1 tablet Oral BID  . simvastatin  40 mg Oral Daily  . sodium chloride flush  3 mL Intravenous Q12H   Continuous Infusions: . sodium chloride     PRN Meds: sodium chloride, acetaminophen, diazepam, magnesium hydroxide, nitroGLYCERIN, ondansetron (ZOFRAN) IV, sodium chloride flush   Vital Signs    Vitals:   09/07/16 2345 09/08/16 0310 09/08/16 0844 09/08/16 1136  BP: 115/79 104/78 (!) 100/53 (!) 97/58  Pulse: 71 65 75   Resp: 18 18 17    Temp: 97.9 F (36.6 C) 98 F (36.7 C) 98 F (36.7 C)   TempSrc: Oral Oral Oral   SpO2: 98% 97% 99%   Weight:      Height:        Intake/Output Summary (Last 24 hours) at 09/08/16 1251 Last data filed at 09/08/16 0844  Gross per 24 hour  Intake              240 ml  Output              350 ml  Net             -110 ml    I/O since admission: -3570  Filed Weights   09/05/16 0121 09/05/16 0505 09/07/16 0300  Weight: 156 lb (70.8 kg) 156 lb (70.8 kg) 153 lb 14.4 oz (69.8 kg)    Telemetry    Atrial flutter with rate win the 80s - 100 range - Personally Reviewed  ECG    ECG (independently read by me): Atrial Flutter 74 with variable block  Physical Exam   BP (!) 97/58 (BP Location: Left Arm)   Pulse 75   Temp 98 F (36.7 C) (Oral)   Resp 17   Ht 6' (1.829 m)   Wt 153 lb 14.4 oz (69.8 kg)   SpO2 99%   BMI 20.87 kg/m  General: Alert,  oriented, no distress.  Skin: normal turgor, no rashes, warm and dry HEENT: Normocephalic, atraumatic. Pupils equal round and reactive to light; sclera anicteric; extraocular muscles intact;  Nose without nasal septal hypertrophy Mouth/Parynx benign; Mallinpatti scale 3 Neck: No JVD, no carotid bruits; normal carotid upstroke Lungs: clear to ausculatation and percussion; no wheezing or rales Chest wall: without tenderness to palpitation Heart: PMI not displaced irregular in the 90s, s1 s2 normal, 1/6 systolic murmur, no diastolic murmur, no rubs, gallops, thrills, or heaves Abdomen: soft, nontender; no hepatosplenomehaly, BS+; abdominal aorta nontender and not dilated by palpation. Back: no CVA tenderness Pulses 2+ Musculoskeletal: full range of motion, normal strength, no joint deformities Extremities: no clubbing cyanosis or edema, Homan's sign negative  Neurologic: old CVA, residual R sided weakness Psychologic: Normal mood and affect   Labs    Chemistry Recent Labs Lab 09/05/16 0150 09/06/16 1016 09/07/16 0314  NA 139 141 136  K 3.7 3.7 3.7  CL 109 102 104  CO2 21* 28 23  GLUCOSE 117* 69 117*  BUN 15 15 12   CREATININE 1.18 1.05 0.91  CALCIUM 9.1 10.1 9.1  GFRNONAA >60 >60 >60  GFRAA >60 >60 >60  ANIONGAP 9 11 9      Hematology Recent Labs Lab 09/06/16 0245 09/07/16 0314 09/08/16 0302  WBC 8.7 8.9 8.4  RBC 5.08 5.06 5.36  HGB 14.9 14.8 15.3  HCT 44.8 43.6 45.9  MCV 88.2 86.2 85.6  MCH 29.3 29.2 28.5  MCHC 33.3 33.9 33.3  RDW 15.0 14.6 14.6  PLT 254 246 264    Cardiac Enzymes Recent Labs Lab 09/05/16 0150  TROPONINI 0.12*    Recent Labs Lab 09/04/16 1839  TROPIPOC 0.13*     BNP Recent Labs Lab 09/04/16 2101  BNP 696.7*     DDimer No results for input(s): DDIMER in the last 168 hours.  Lipid Panel     Component Value Date/Time   CHOL 121 07/07/2007 2028   TRIG 140 07/07/2007 2028   HDL 31 (L) 07/07/2007 2028   CHOLHDL 3.9 Ratio  07/07/2007 1916   VLDL 28 07/07/2007 2028   LDLCALC 62 07/07/2007 2028    Radiology    No results found.  Cardiac Studies   Conclusion     Ost LAD lesion, 20 %stenosed.  Prox LAD lesion, 35 %stenosed.  Ost 1st Diag lesion, 40 %stenosed.  Ost 2nd Diag to 2nd Diag lesion, 20 %stenosed.   Normal to very minimal elevation of right heart pressures.  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  RECOMMENDATION: Guideline directed medical therapy for the patient's nonischemic cardiomyopathy    ------------------------------------------------------------------- ECHO Study Conclusions  - Left ventricle: The cavity size was normal. Wall thickness was   increased in a pattern of moderate LVH. Systolic function was   severely reduced. The estimated ejection fraction was in the   range of 20% to 25%. Diffuse hypokinesis. Akinesis of the   anteroseptal myocardium. - Aortic valve: Transvalvular velocity was within the normal range.   There was no stenosis. There was no regurgitation. - Mitral valve: Transvalvular velocity was within the normal range.   There was no evidence for stenosis. There was trivial   regurgitation. - Left atrium: The atrium was severely dilated. - Right ventricle: The cavity size was normal. Wall thickness was   normal. Systolic function was mildly reduced. - Right atrium: The atrium was severely dilated. - Atrial septum: No defect or patent foramen ovale was identified   by color flow Doppler. - Tricuspid valve: There was mild regurgitation. - Pulmonary arteries: Systolic pressure was moderately  increased.   PA peak pressure: 49 mm Hg (S).   Patient Profile     62 y.o. male with newly diagnosed persistent atrial flutter of uncertain duration, cardiomegaly, CHF, history of DM, HLP, previous stroke   Assessment & Plan    1. NICM:  EF 20 - 25%. Entresto started at 24/26 yesterday.  BP slightly low; low LVEDP and filling pressures at cath.  Will dc furosemide and try to start low dose carvedilol.  Consider spironolactone if BP  can tolerate.  2: AFlutter; Now on eliquis; rate has increased today to 80 - 110 range; will start carvedilol 3.125.  Severe bi-atrial dilation on echo,  3. Mild nonobstructibve CAD: reviewed data with pt and family member.  4. 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

## 2016-09-08 NOTE — Progress Notes (Signed)
Pt had seven beat run of VTach.

## 2016-09-08 NOTE — Progress Notes (Signed)
Notified cardiology fellow of 2 episodes of asymptomatic 6 beat run of vtach.

## 2016-09-08 NOTE — Discharge Instructions (Addendum)
Call [facility] at [phone] if any bleeding, swelling or drainage at cath site.  May shower, no tub baths for 48 hours for groin sticks. No lifting over 5 pounds for 3 days.  No Driving for 3 days  Weigh daily, call the office [phone] if wt is up 3 lbs in a day or 5 lbs in a week.   Low salt diabetic diet     Information on my medicine - ELIQUIS (apixaban)  This medication education was reviewed with me or my healthcare representative as part of my discharge preparation.  The pharmacist that spoke with me during my hospital stay was:  Nolen Mu, RPH  Why was Eliquis prescribed for you? Eliquis was prescribed for you to reduce the risk of forming blood clots that can cause a stroke if you have a medical condition called atrial fibrillation (a type of irregular heartbeat) OR to reduce the risk of a blood clots forming after orthopedic surgery.  What do You need to know about Eliquis ? Take your Eliquis TWICE DAILY - one tablet in the morning and one tablet in the evening with or without food.  It would be best to take the doses about the same time each day.  If you have difficulty swallowing the tablet whole please discuss with your pharmacist how to take the medication safely.  Take Eliquis exactly as prescribed by your doctor and DO NOT stop taking Eliquis without talking to the doctor who prescribed the medication.  Stopping may increase your risk of developing a new clot or stroke.  Refill your prescription before you run out.  After discharge, you should have regular check-up appointments with your healthcare provider that is prescribing your Eliquis.  In the future your dose may need to be changed if your kidney function or weight changes by a significant amount or as you get older.  What do you do if you miss a dose? If you miss a dose, take it as soon as you remember on the same day and resume taking twice daily.  Do not take more than one dose  of ELIQUIS at the same time.  Important Safety Information A possible side effect of Eliquis is bleeding. You should call your healthcare provider right away if you experience any of the following: ? Bleeding from an injury or your nose that does not stop. ? Unusual colored urine (red or dark brown) or unusual colored stools (red or black). ? Unusual bruising for unknown reasons. ? A serious fall or if you hit your head (even if there is no bleeding).  Some medicines may interact with Eliquis and might increase your risk of bleeding or clotting while on Eliquis. To help avoid this, consult your healthcare provider or pharmacist prior to using any new prescription or non-prescription medications, including herbals, vitamins, non-steroidal anti-inflammatory drugs (NSAIDs) and supplements.  This website has more information on Eliquis (apixaban): [email or web address].com.cy.

## 2016-09-08 NOTE — Progress Notes (Signed)
Spoke to case manager on phone about new medications patient is starting on- Entresto and Eliquis. Gave patient 30-day trial card for both drugs. Waiting for family to arrive to discuss new medications. Will continue to monitor.

## 2016-09-09 DIAGNOSIS — I483 Typical atrial flutter: Secondary | ICD-10-CM

## 2016-09-09 DIAGNOSIS — R748 Abnormal levels of other serum enzymes: Secondary | ICD-10-CM

## 2016-09-09 DIAGNOSIS — E119 Type 2 diabetes mellitus without complications: Secondary | ICD-10-CM

## 2016-09-09 DIAGNOSIS — I5023 Acute on chronic systolic (congestive) heart failure: Secondary | ICD-10-CM

## 2016-09-09 LAB — BASIC METABOLIC PANEL
ANION GAP: 10 (ref 5–15)
BUN: 19 mg/dL (ref 6–20)
CHLORIDE: 104 mmol/L (ref 101–111)
CO2: 23 mmol/L (ref 22–32)
Calcium: 9.1 mg/dL (ref 8.9–10.3)
Creatinine, Ser: 1.01 mg/dL (ref 0.61–1.24)
GFR calc Af Amer: >60 mL/min (ref >60)
GLUCOSE: 123 mg/dL — AB (ref 65–99)
POTASSIUM: 3.8 mmol/L (ref 3.5–5.1)
SODIUM: 137 mmol/L (ref 135–145)

## 2016-09-09 LAB — CBC
HEMATOCRIT: 45.6 % (ref 39.0–52.0)
HEMOGLOBIN: 15.2 g/dL (ref 13.0–17.0)
MCH: 29.1 pg (ref 26.0–34.0)
MCHC: 33.3 g/dL (ref 30.0–36.0)
MCV: 87.4 fL (ref 78.0–100.0)
Platelets: 264 10*3/uL (ref 150–400)
RBC: 5.22 MIL/uL (ref 4.22–5.81)
RDW: 14.7 % (ref 11.5–15.5)
WBC: 9.1 10*3/uL (ref 4.0–10.5)

## 2016-09-09 LAB — GLUCOSE, CAPILLARY
GLUCOSE-CAPILLARY: 121 mg/dL — AB (ref 65–99)
Glucose-Capillary: 116 mg/dL — ABNORMAL HIGH (ref 65–99)

## 2016-09-09 LAB — BRAIN NATRIURETIC PEPTIDE: B NATRIURETIC PEPTIDE 5: 167.3 pg/mL — AB (ref 0.0–100.0)

## 2016-09-09 MED ORDER — APIXABAN 5 MG PO TABS: 5.0000 mg | ORAL_TABLET | Freq: Two times a day (BID) | ORAL | 0 refills | Status: DC

## 2016-09-09 MED ORDER — SACUBITRIL-VALSARTAN 24-26 MG PO TABS: 1.0000 | ORAL_TABLET | Freq: Two times a day (BID) | ORAL | 0 refills | Status: DC

## 2016-09-09 MED ORDER — FUROSEMIDE 20 MG PO TABS: 20.0000 mg | ORAL_TABLET | Freq: Every day | ORAL | 6 refills | Status: DC

## 2016-09-09 MED ORDER — ASPIRIN 81 MG PO TBEC: 81.0000 mg | DELAYED_RELEASE_TABLET | Freq: Every day | ORAL | Status: DC

## 2016-09-09 MED ORDER — LORATADINE 10 MG PO TABS: 10.0000 mg | ORAL_TABLET | Freq: Every day | ORAL | 6 refills | Status: DC

## 2016-09-09 MED ORDER — APIXABAN 5 MG PO TABS: 5.0000 mg | ORAL_TABLET | Freq: Two times a day (BID) | ORAL | 6 refills | Status: DC

## 2016-09-09 MED ORDER — CARVEDILOL 3.125 MG PO TABS: 3.1250 mg | ORAL_TABLET | Freq: Two times a day (BID) | ORAL | 6 refills | Status: DC

## 2016-09-09 MED ORDER — SACUBITRIL-VALSARTAN 24-26 MG PO TABS: 1.0000 | ORAL_TABLET | Freq: Two times a day (BID) | ORAL | 6 refills | Status: DC

## 2016-09-09 MED ORDER — GLIPIZIDE 10 MG PO TABS: 10.0000 mg | ORAL_TABLET | Freq: Every day | ORAL | 6 refills | Status: DC

## 2016-09-09 MED ORDER — FUROSEMIDE 20 MG PO TABS
20.0000 mg | ORAL_TABLET | Freq: Every day | ORAL | Status: DC
  Administered 2016-09-09: 20 mg via ORAL
  Filled 2016-09-09: qty 1

## 2016-09-09 NOTE — Discharge Summary (Signed)
Discharge Summary    Patient ID: [name],  MRN: [id number], DOB/AGE: 61/25/1957 61 y.o.  Admit date: 09/04/2016 Discharge date: 09/09/2016  Primary Care Provider: Iona Beard Primary Cardiologist: Dr. Angelena Form  Discharge Diagnoses    Principal Problem:   Atrial flutter [facility]) Active Problems:   HLD (hyperlipidemia)   Cardiomyopathy ([place])   History of stroke   Acute systolic (congestive) heart failure ([place])   Type 2 diabetes mellitus without complication, without long-term current use of insulin (HCC)   Allergies No Known Allergies  Diagnostic Studies/Procedures    Echo 09/05/16  Study Conclusions  - Left ventricle: The cavity size was normal. Wall thickness was   increased in a pattern of moderate LVH. Systolic function was   severely reduced. The estimated ejection fraction was in the   range of 20% to 25%. Diffuse hypokinesis. Akinesis of the   anteroseptal myocardium. - Aortic valve: Transvalvular velocity was within the normal range.   There was no stenosis. There was no regurgitation. - Mitral valve: Transvalvular velocity was within the normal range.   There was no evidence for stenosis. There was trivial   regurgitation. - Left atrium: The atrium was severely dilated. - Right ventricle: The cavity size was normal. Wall thickness was   normal. Systolic function was mildly reduced. - Right atrium: The atrium was severely dilated. - Atrial septum: No defect or patent foramen ovale was identified   by color flow Doppler. - Tricuspid valve: There was mild regurgitation. - Pulmonary arteries: Systolic pressure was moderately increased.   PA peak pressure: 49 mm Hg (S).   Cardiac cath 09/06/16 Procedures   RIGHT/LEFT HEART CATH AND CORONARY ANGIOGRAPHY  Conclusion     Ost LAD lesion, 20 %stenosed.  Prox LAD lesion, 35 %stenosed.  Ost 1st Diag lesion, 40 %stenosed.  Ost 2nd Diag to 2nd Diag lesion, 20 %stenosed.   Normal to very minimal  elevation of right heart pressures.  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  RECOMMENDATION: Guideline directed medical therapy for the patient's nonischemic cardiomyopathy     _____________   History of Present Illness     61 year old man who had a stroke in 2010. He has residual right-sided weakness, HTN, and DM-2.   He has noted increased shortness of breath and cough prior to admit.  He came to the emergency room and ECG showed that he was in atrial flutter.  BNP was elevated. Chest x-ray showed marked cardiomegaly.  He does not complain of palpitations. It is difficult for him to lie flat and breathe. He has been coughing more than usual. He has chronic right-sided weakness.  IV Cardizem was started in the emergency room but he began to drop his heart rate into the high 30s and 40s.  Stopped  the IV Cardizem. Heart rate was in the 80s on his initial ECG.  He was admitted to hospital on IV heparin.  Troponin was elevated.     Hospital Course     Consultants: none   Pt was diuresed and at discharge is neg 3,667 and wt 153, down from 190 lbs.  On admit.  Echo returned with EF 20%.  And  with elevated troponin 0.13 it was decided to proceed with cardiac cath on the 31st.  NICM.  Pt did have one episode of NSVT 10 beats and K+ replaced.   Delene Loll was started.     Cardiac cath as above.  Post cath eliquis was started.  BB was started.    By 09/09/16 pt was stable and seen by Dr. Angelena Form and found stable for discharge.  He will be TOC pt and will need BMET that day as well.  ASA continued with hx of CVA. 30 day script for Eliquis  and entresto given and script for 6 months sent in.    Will plan one month of anti-coagulation and then consider DCCV, although chance of maintaining NSR is low given large biatrial enlargement.  This will be arranged as outpt.   _____________  Discharge Vitals Blood pressure 95/64, pulse (!) 54, temperature 97.7 F (36.5 C), temperature source Oral, resp. rate 18, height 6' (1.829 m), weight 153 lb 8 oz (69.6 kg), SpO2 100 %.  Filed Weights   09/05/16 0505 09/07/16 0300 09/09/16 0400  Weight: 156 lb (70.8 kg) 153 lb 14.4 oz (69.8 kg) 153 lb 8 oz (69.6 kg)    Labs & Radiologic Studies    CBC  Recent Labs  09/08/16 1323 09/09/16 0208  WBC 10.2 9.1  NEUTROABS 6.2  --   HGB 16.4 15.2  HCT 49.1 45.6  MCV 88.2 87.4  PLT 308 295   Basic Metabolic Panel  Recent Labs  09/07/16 0314 09/09/16 0208  NA 136 137  K 3.7 3.8  CL 104 104  CO2 23 23  GLUCOSE 117* 123*  BUN 12 19  CREATININE 0.91 1.01  CALCIUM 9.1 9.1  MG 1.9  --    Hepatic Function Latest Ref Rng & Units 02/12/2007 10/16/2006  Total Protein 6.0 - 8.3 g/dL 7.8 8.4(H)  Albumin 3.5 - 5.2 g/dL 4.8 4.8  AST 0 - 37 units/L 24 24  ALT 0 - 53 units/L 26 24  Alk Phosphatase 39 - 117 units/L 108 116  Total Bilirubin 0.3 - 1.2 mg/dL 0.4 0.3   pk troponin 0.13  BNP BNP (last 3 results)  Recent Labs  09/04/16 2101 09/09/16 0208  BNP 696.7* 167.3*      D-Dimer No results for input(s): DDIMER in the last 72 hours. Hemoglobin A1C  6.6 No results for input(s): HGBA1C in the last 72 hours. Fasting Lipid Panel No results for input(s): CHOL, HDL, LDLCALC, TRIG, CHOLHDL, LDLDIRECT in the last 72 hours. Thyroid Function Tests No results for input(s): TSH, T4TOTAL, T3FREE, THYROIDAB in the last 72 hours.  Invalid input(s): FREET3 _____________  Dg Chest 2 View  Result Date: 09/04/2016 CLINICAL DATA:  Increased exertional shortness of breath for 4-5 days, history hypertension, diabetes mellitus EXAM: CHEST  2 VIEW  COMPARISON:  08/06/2012 FINDINGS: Enlargement of cardiac silhouette. Mediastinal contours and pulmonary vascularity normal. RIGHT basilar subsegmental atelectasis. Lungs otherwise clear. No infiltrate, pleural effusion or pneumothorax. Bones unremarkable. IMPRESSION: Enlargement of cardiac silhouette. RIGHT basilar subsegmental atelectasis. Electronically Signed   By: Lavonia Dana M.D.   On: 09/04/2016 19:00   Disposition   Pt is being discharged home today in good condition.  Follow-up Plans & Appointments    Follow-up Information    [facility] Follow up.   Why:  has both Eliquis and Entresto in stock, please take a copy of prescription and your free trial cards.  Contact information: [place] [place]  Burnell Blanks, MD Follow up.   Specialty:  Cardiology Why:  the office will call with appt tomorrow.  you will be seen in 5-7 days.  you will need lab work that day as well.  Contact information: [address] [place] [address] [phone]           Call [facility] [address] at [phone] if any bleeding, swelling or drainage at cath site.  May shower, no tub baths for 48 hours for groin sticks. No lifting over 5 pounds for 3 days.  No Driving for 3 days  Weigh daily, call the office [phone] if wt is up 3 lbs in a day or 5 lbs in a week.   Low salt diabetic diet     Discharge Medications   Current Discharge Medication List    START taking these medications   Details  apixaban (ELIQUIS) 5 MG TABS tablet Take 1 tablet (5 mg total) by mouth 2 (two) times daily. Qty: 60 tablet, Refills: 0    aspirin EC 81 MG EC tablet Take 1 tablet (81 mg total) by mouth daily.    carvedilol (COREG) 3.125 MG tablet Take 1 tablet (3.125 mg total) by mouth 2 (two) times daily with a meal. Qty: 60 tablet, Refills: 6    furosemide (LASIX) 20 MG tablet Take 1 tablet (20 mg total) by mouth daily. Qty: 30 tablet, Refills: 6     sacubitril-valsartan (ENTRESTO) 24-26 MG Take 1 tablet by mouth 2 (two) times daily. Qty: 60 tablet, Refills: 0      CONTINUE these medications which have CHANGED   Details  glipiZIDE (GLUCOTROL) 10 MG tablet Take 1 tablet (10 mg total) by mouth daily with breakfast. Qty: 30 tablet, Refills: 6    loratadine (CLARITIN) 10 MG tablet Take 1 tablet (10 mg total) by mouth daily. Qty: 30 tablet, Refills: 6      CONTINUE these medications which have NOT CHANGED   Details  acetaminophen (TYLENOL) 500 MG tablet Take 1,000 mg by mouth every 6 (six) hours as needed for pain.    metFORMIN (GLUCOPHAGE) 1000 MG tablet Take 1,000 mg by mouth 2 (two) times daily.    Multiple Vitamin (MULTIVITAMIN WITH MINERALS) TABS Take 1 tablet by mouth daily.    omega-3 acid ethyl esters (LOVAZA) 1 G capsule Take 1 g by mouth daily.    simvastatin (ZOCOR) 40 MG tablet Take 40 mg by mouth daily.           Outstanding Labs/Studies   BMET on office visit.  Please order  Duration of Discharge Encounter   Greater than 30 minutes including physician time.  Signed, Cecilie Kicks NP 09/09/2016, 9:20 AM

## 2016-09-09 NOTE — Progress Notes (Signed)
Notified by CCMD PT had 5 bts NSVT. Pt asymptomatic/no complaints at this time. Will continue to monitor. Dierdre Highman, RN

## 2016-09-09 NOTE — Progress Notes (Signed)
Progress Note  Patient Name: [name] Date of Encounter: 09/09/2016  Primary Cardiologist: [name] (last seen in office in 2011)  Subjective   No chest pain or dyspnea.   Inpatient Medications    Scheduled Meds: . apixaban  5 mg Oral BID  . aspirin EC  81 mg Oral Daily  . carvedilol  3.125 mg Oral BID WC  . glipiZIDE  10 mg Oral Q breakfast  . insulin aspart  0-15 Units Subcutaneous TID WC  . loratadine  10 mg Oral Daily  . metFORMIN  1,000 mg Oral BID WC  . multivitamin with minerals  1 tablet Oral Daily  . omega-3 acid ethyl esters  1 g Oral Daily  . sacubitril-valsartan  1 tablet Oral BID  . simvastatin  40 mg Oral Daily  . sodium chloride flush  3 mL Intravenous Q12H   Continuous Infusions: . sodium chloride     PRN Meds: sodium chloride, acetaminophen, diazepam, magnesium hydroxide, nitroGLYCERIN, ondansetron (ZOFRAN) IV, sodium chloride flush   Vital Signs    Vitals:   09/08/16 2213 09/09/16 0200 09/09/16 0400 09/09/16 0630  BP: 92/69 90/62  95/64  Pulse: 80 (!) 59  (!) 54  Resp: 18   18  Temp: 98.7 F (37.1 C)   97.7 F (36.5 C)  TempSrc: Oral   Oral  SpO2: 100%   100%  Weight:   153 lb 8 oz (69.6 kg)   Height:        Intake/Output Summary (Last 24 hours) at 09/09/16 0748 Last data filed at 09/09/16 0525  Gross per 24 hour  Intake              243 ml  Output              100 ml  Net              143 ml   Filed Weights   09/05/16 0505 09/07/16 0300 09/09/16 0400  Weight: 156 lb (70.8 kg) 153 lb 14.4 oz (69.8 kg) 153 lb 8 oz (69.6 kg)    Telemetry    Atrial flutter - Personally Reviewed  ECG      Physical Exam  GEN: No acute distress.   Neck: No JVD Cardiac: Irreg irreg with no murmurs, rubs, or gallops.  Respiratory: Clear to auscultation bilaterally. GI: Soft, nontender, non-distended  Ext: No LE edema Neuro: right sided weakness. Expressive aphasia.  Psych: Normal affect   Labs    Chemistry Recent Labs Lab  09/06/16 1016 09/07/16 0314 09/09/16 0208  NA 141 136 137  K 3.7 3.7 3.8  CL 102 104 104  CO2 28 23 23   GLUCOSE 69 117* 123*  BUN 15 12 19   CREATININE 1.05 0.91 1.01  CALCIUM 10.1 9.1 9.1  GFRNONAA >60 >60 >60  GFRAA >60 >60 >60  ANIONGAP 11 9 10      Hematology Recent Labs Lab 09/08/16 0302 09/08/16 1323 09/09/16 0208  WBC 8.4 10.2 9.1  RBC 5.36 5.57 5.22  HGB 15.3 16.4 15.2  HCT 45.9 49.1 45.6  MCV 85.6 88.2 87.4  MCH 28.5 29.4 29.1  MCHC 33.3 33.4 33.3  RDW 14.6 14.7 14.7  PLT 264 308 264    Cardiac Enzymes Recent Labs Lab 09/05/16 0150  TROPONINI 0.12*    Recent Labs Lab 09/04/16 1839  TROPIPOC 0.13*     BNP Recent Labs Lab 09/04/16 2101 09/09/16 0208  BNP 696.7* 167.3*     DDimer No results for  input(s): DDIMER in the last 168 hours.   Radiology    No results found.  Cardiac Studies   Echo 09/05/16: - Left ventricle: The cavity size was normal. Wall thickness was   increased in a pattern of moderate LVH. Systolic function was   severely reduced. The estimated ejection fraction was in the   range of 20% to 25%. Diffuse hypokinesis. Akinesis of the   anteroseptal myocardium. - Aortic valve: Transvalvular velocity was within the normal range.   There was no stenosis. There was no regurgitation. - Mitral valve: Transvalvular velocity was within the normal range.   There was no evidence for stenosis. There was trivial   regurgitation. - Left atrium: The atrium was severely dilated. - Right ventricle: The cavity size was normal. Wall thickness was   normal. Systolic function was mildly reduced. - Right atrium: The atrium was severely dilated. - Atrial septum: No defect or patent foramen ovale was identified   by color flow Doppler. - Tricuspid valve: There was mild regurgitation. - Pulmonary arteries: Systolic pressure was moderately increased.   PA peak pressure: 49 mm Hg (S).  Cardiac cath 09/06/16: Conclusion     Ost LAD lesion,  20 %stenosed.  Prox LAD lesion, 35 %stenosed.  Ost 1st Diag lesion, 40 %stenosed.  Ost 2nd Diag to 2nd Diag lesion, 20 %stenosed.  Normal to very minimal elevation of right heart pressures.  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  RECOMMENDATION: Guideline directed medical therapy for the patient's nonischemic cardiomyopathy      Patient Profile     61 y.o. male with newly diagnosed non-ischemic cardiomyopathy admitted with atrial flutter with controlled rate, elevated troponin, volume overload and found to have reduced LV systolic function with   Assessment & Plan    1. Atrial flutter/fibrillation: He is rate controlled on low dose beta blocker. He is on Eliquis. Will plan one month of anti-coagulation and then consider DCCV, although chance of maintaining NSR is low given large biatrial enlargement.   2. CAD: Mild non-obstructive CAD by cath last week. Will continue ASA, statin, beta blocker.   3. Non-ischemic cardiomyopathy/Acute on chronic systolic CHF: He seems euvolemic today. Will plan to resume low dose Lasix at 20 mg po Qdaily. Will continue Entresto, Coreg. I do not think his BP will tolerate spironolactone at this time. He will need a BMET in one week.   4. History of CVA: he has expressive aphasia and chronic right sided weakness.   Dispo: Plan to discharge home today. He will need follow up in one week with office APP with BMET.   Signed, Verne Carrow, MD  09/09/2016, 7:48 AM

## 2016-09-09 NOTE — Progress Notes (Signed)
Patient given discharge instructions with niece at bedside. Patient started on a few new meds. Gave niece the entresto and eliquis 30-day free trial card. Patient and niece given medication education. Both peripheral IV's removed. Groin site, level 0, post-cath. Both aware of follow-up appointment within a week with cardiologist. Patient escorted out via wheelchair by nurse tech.

## 2016-09-09 NOTE — Progress Notes (Signed)
Pt has had consecutive BP of <100 systolic and <70 diastolic. At 2213 BP 92/69 and at 0200 90/62

## 2016-09-10 ENCOUNTER — Encounter (HOSPITAL_COMMUNITY): Payer: Self-pay | Admitting: Cardiovascular Disease

## 2016-09-10 ENCOUNTER — Telehealth: Payer: Self-pay | Admitting: Cardiovascular Disease

## 2016-09-10 LAB — GLUCOSE, CAPILLARY: GLUCOSE-CAPILLARY: 103 mg/dL — AB (ref 65–99)

## 2016-09-10 MED FILL — Lidocaine HCl Local Inj 2%: INTRAMUSCULAR | Qty: 10 | Status: AC

## 2016-09-10 NOTE — Telephone Encounter (Signed)
Patient contacted regarding discharge from [facility] on 09/09/16.  Patient understands to follow up with provider Jacolyn Reedy, PA-c on 09/16/16 at 12:15 at [address] [facility] in [place]. Patient understands discharge instructions? Yes Patient understands medications and regiment?Yes Patient understands to bring all medications to this visit? Yes  The pt came to the phone and gave verbal permission for me to talk with his mother, Brown Human, on his behalf. Brown Human states that the pt is doing ok at this time and that he has had no complaints. She does have [facility]'s phone number to call if they have questions or concerns.

## 2016-09-10 NOTE — Telephone Encounter (Signed)
New message      TOC appt made on 09-16-16 at 12:15 with Herma Carson per Nada Boozer.

## 2016-09-16 ENCOUNTER — Ambulatory Visit (INDEPENDENT_AMBULATORY_CARE_PROVIDER_SITE_OTHER): Payer: Medicaid Other | Admitting: Physician Assistant

## 2016-09-16 ENCOUNTER — Encounter: Payer: Self-pay | Admitting: Physician Assistant

## 2016-09-16 VITALS — BP 110/70 | HR 63 | Ht 72.0 in | Wt 154.4 lb

## 2016-09-16 DIAGNOSIS — E785 Hyperlipidemia, unspecified: Secondary | ICD-10-CM

## 2016-09-16 DIAGNOSIS — I420 Dilated cardiomyopathy: Secondary | ICD-10-CM | POA: Diagnosis not present

## 2016-09-16 DIAGNOSIS — I483 Typical atrial flutter: Secondary | ICD-10-CM | POA: Diagnosis not present

## 2016-09-16 DIAGNOSIS — Z8673 Personal history of transient ischemic attack (TIA), and cerebral infarction without residual deficits: Secondary | ICD-10-CM | POA: Diagnosis not present

## 2016-09-16 MED ORDER — SACUBITRIL-VALSARTAN 49-51 MG PO TABS: 1.0000 | ORAL_TABLET | Freq: Two times a day (BID) | ORAL | 5 refills | Status: DC

## 2016-09-16 NOTE — Progress Notes (Signed)
Cardiology Office Note    Date:  09/16/2016   ID:  [name], DOB 10-16-1955, MRN [id number]  PCP:  Mirna Mires, MD  Cardiologist: Dr. [name]  Chief Complaint  Patient presents with  . Follow-up    History of Present Illness:  [name] is a 61 y.o. male with history of CVA in 2010 with chronic right-sided weakness and expressive aphasia, hypertension and diabetes mellitus type 2. He was found to be in atrial flutter and was started on IV diltiazem but heart rate dropped in the 30s and 40s so this was stopped. 2-D echo LVEF 20% with elevated troponin of 0.13. He had 10 beat run of NSVT. 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 Aspirin was dcontinued with a history of CVA, started on Eliquis and Entresto. Plan is one month of anticoagulation and consider DC CV although chance of maintaining normal sinus rhythm is low given biatrial enlargement.  Patient comes in today for post hospital follow-up accompanied by his niece. He has done quite well without dyspnea or dyspnea on exertion or edema. He feels like he is back to normal. No heart racing or skipping.      Past Medical History:  Diagnosis Date  . Diabetes mellitus   . Hypertension   . Stroke [facility])     Past Surgical History:  Procedure Laterality Date  . RIGHT/LEFT HEART CATH AND CORONARY ANGIOGRAPHY N/A 09/06/2016   Procedure: RIGHT/LEFT HEART CATH AND CORONARY ANGIOGRAPHY;  Surgeon: Lennette Bihari, MD;  [facility];  Service: Cardiovascular;  Laterality: N/A;  . TRACHEOSTOMY      Current Medications: Current Meds  Medication Sig  . acetaminophen (TYLENOL) 500 MG tablet Take 1,000 mg by mouth every 6 (six) hours as needed for pain.  Marland Kitchen apixaban (ELIQUIS) 5 MG TABS tablet Take 1 tablet (5 mg total) by mouth 2 (two) times daily.  Marland Kitchen aspirin EC 81 MG EC tablet Take 1 tablet (81 mg total) by mouth daily.  . carvedilol (COREG)  3.125 MG tablet Take 1 tablet (3.125 mg total) by mouth 2 (two) times daily with a meal.  . furosemide (LASIX) 20 MG tablet Take 1 tablet (20 mg total) by mouth daily.  Marland Kitchen glipiZIDE (GLUCOTROL) 10 MG tablet Take 1 tablet (10 mg total) by mouth daily with breakfast.  . loratadine (CLARITIN) 10 MG tablet Take 1 tablet (10 mg total) by mouth daily.  . metFORMIN (GLUCOPHAGE) 1000 MG tablet Take 1,000 mg by mouth 2 (two) times daily.  . Multiple Vitamin (MULTIVITAMIN WITH MINERALS) TABS Take 1 tablet by mouth daily.  Marland Kitchen omega-3 acid ethyl esters (LOVAZA) 1 G capsule Take 1 g by mouth daily.  . sacubitril-valsartan (ENTRESTO) 24-26 MG Take 1 tablet by mouth 2 (two) times daily.  . simvastatin (ZOCOR) 40 MG tablet Take 40 mg by mouth daily.     Allergies:   Patient has no known allergies.   Social History   Social History  . Marital status: Single    Spouse name: N/A  . Number of children: N/A  . Years of education: N/A   Social History Main Topics  . Smoking status: Never Smoker  . Smokeless tobacco: Never Used  . Alcohol use No  . Drug use: No  . Sexual activity: Not Currently   Other Topics Concern  . None   Social History Narrative  . None     Family History:  The patient's family history is not  on file.   ROS:   Please see the history of present illness.    Review of Systems  Constitution: Negative.  HENT: Negative.   Cardiovascular: Negative.   Respiratory: Negative.   Endocrine: Negative.   Hematologic/Lymphatic: Negative.   Musculoskeletal: Negative.   Gastrointestinal: Negative.   Genitourinary: Negative.   Neurological: Positive for paresthesias.       Right sided weakness from prior stroke   All other systems reviewed and are negative.   PHYSICAL EXAM:   VS:  BP 110/70   Pulse 63   Ht 6' (1.829 m)   Wt 154 lb 6.4 oz (70 kg)   SpO2 99%   BMI 20.94 kg/m   Physical Exam  GEN: Well nourished, well developed, in no acute distress  Neck: no JVD, carotid  bruits, or masses Cardiac:RRR; no murmurs, rubs, or gallops  Respiratory:  clear to auscultation bilaterally, normal work of breathing GI: soft, nontender, nondistended, + BS Ext: right groin without hematoma or hemorrhage otherwise without cyanosis, clubbing, or edema, Good distal pulses bilaterally Neuro:  Alert and Oriented x 3 Psych: euthymic mood, full affect  Wt Readings from Last 3 Encounters:  09/16/16 154 lb 6.4 oz (70 kg)  09/09/16 153 lb 8 oz (69.6 kg)  07/04/09 168 lb (76.2 kg)      Studies/Labs Reviewed:   EKG:  EKG is  ordered today.  The ekg ordered today demonstrates Atrial flutter at 59 bpm  Recent Labs: 09/07/2016: Magnesium 1.9 09/09/2016: B Natriuretic Peptide 167.3; BUN 19; Creatinine, Ser 1.01; Hemoglobin 15.2; Platelets 264; Potassium 3.8; Sodium 137   Lipid Panel    Component Value Date/Time   CHOL 121 07/07/2007 2028   TRIG 140 07/07/2007 2028   HDL 31 (L) 07/07/2007 2028   CHOLHDL 3.9 Ratio 07/07/2007 2028   VLDL 28 07/07/2007 2028   LDLCALC 62 07/07/2007 2028    Additional studies/ records that were reviewed today include:    Echo 09/05/16: - Left ventricle: The cavity size was normal. Wall thickness was   increased in a pattern of moderate LVH. Systolic function was   severely reduced. The estimated ejection fraction was in the   range of 20% to 25%. Diffuse hypokinesis. Akinesis of the   anteroseptal myocardium. - Aortic valve: Transvalvular velocity was within the normal range.   There was no stenosis. There was no regurgitation. - Mitral valve: Transvalvular velocity was within the normal range.   There was no evidence for stenosis. There was trivial   regurgitation. - Left atrium: The atrium was severely dilated. - Right ventricle: The cavity size was normal. Wall thickness was   normal. Systolic function was mildly reduced. - Right atrium: The atrium was severely dilated. - Atrial septum: No defect or patent foramen ovale was identified    by color flow Doppler. - Tricuspid valve: There was mild regurgitation. - Pulmonary arteries: Systolic pressure was moderately increased.   PA peak pressure: 49 mm Hg (S).   Cardiac cath 09/06/16: Conclusion       Ost LAD lesion, 20 %stenosed.  Prox LAD lesion, 35 %stenosed.  Ost 1st Diag lesion, 40 %stenosed.  Ost 2nd Diag to 2nd Diag lesion, 20 %stenosed.   Normal to very minimal elevation of right heart pressures.   LVEDP 9 mm Hg   Mild nonobstructive CAD involving the LAD with 20% proximal stenosis, 30-40% stenosis in the region of the first diagonal takeoff with 40% stenosis in the diagonal-1 vessel, and 20% narrowing in the  second diagonal vessel, with slow filling apically; normal left circumflex coronary artery and normal dominant RCA.   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   RECOMMENDATION: Guideline directed medical therapy for the patient's nonischemic cardiomyopathy            ASSESSMENT:    1. Dilated cardiomyopathy (HCC)   2. Typical atrial flutter (HCC)   3. Hyperlipidemia, unspecified hyperlipidemia type   4. History of stroke      PLAN:  In order of problems listed above:  Dilated cardiomyopathy ejection fraction 20%. Patient is well compensated today without heart failure. Will titrate Entresto 49/51 mg.  Typical atrial flutter heart rate 59 on low-dose Coreg 3.125 mg twice a day. He got slow in the hospital and IV diltiazem. We'll not titrate this up today. Plan to bring him back in 2-3  weeks with Dr. [name] to schedule cardioversion. Continue Eliquis.  Hyperlipidemia on Zocor  History of CVA with expressive aphasia and right-sided weakness T aspirin    Medication Adjustments/Labs and Tests Ordered: Current medicines are reviewed at length with the patient today.  Concerns regarding medicines are outlined above.  Medication changes, Labs and Tests ordered today are listed in the Patient Instructions below. Patient  Instructions  Medication Instructions:   START TAKING ENTRESTO 49-51 TWICE A DAY   If you need a refill on your cardiac medications before your next appointment, please call your pharmacy.  Labwork:  BMET TODAY     Testing/Procedures: NONE ORDERED  TODAY    Follow-Up: WITH DR MCALHANY IN 2 TO 3 WEEKS POST CARDIOVERSION ( IF NOT CALL NURSE PAT  )    Any Other Special Instructions Will Be Listed Below (If Applicable).                                                                                                                                                      Signed, Jacolyn Reedy, PA-C  09/16/2016 1:18 PM    [facility] [address] [place], [place], [place]  [address] Phone: [phone]; Fax: [phone]

## 2016-09-16 NOTE — Patient Instructions (Signed)
Medication Instructions:   START TAKING ENTRESTO 49-51 TWICE A DAY   If you need a refill on your cardiac medications before your next appointment, please call your pharmacy.  Labwork:  BMET TODAY     Testing/Procedures: NONE ORDERED  TODAY    Follow-Up: WITH DR MCALHANY IN 2 TO 3 WEEKS POST CARDIOVERSION ( IF NOT CALL NURSE PAT  )    Any Other Special Instructions Will Be Listed Below (If Applicable).

## 2016-09-16 NOTE — Addendum Note (Signed)
Addended by: Oleta Mouse on: 09/16/2016 01:27 PM   Modules accepted: Orders

## 2016-09-16 NOTE — Addendum Note (Signed)
Addended by: Tonita Phoenix on: 09/16/2016 01:29 PM   Modules accepted: Orders

## 2016-09-17 ENCOUNTER — Telehealth: Payer: Self-pay | Admitting: Physician Assistant

## 2016-09-17 LAB — BASIC METABOLIC PANEL
BUN / CREAT RATIO: 11 (ref 10–24)
BUN: 11 mg/dL (ref 8–27)
CHLORIDE: 100 mmol/L (ref 96–106)
CO2: 22 mmol/L (ref 20–29)
Calcium: 10.1 mg/dL (ref 8.6–10.2)
Creatinine, Ser: 1.01 mg/dL (ref 0.76–1.27)
GFR, EST AFRICAN AMERICAN: 93 mL/min/{1.73_m2} (ref >59)
GFR, EST NON AFRICAN AMERICAN: 80 mL/min/{1.73_m2} (ref >59)
Glucose: 51 mg/dL — ABNORMAL LOW (ref 65–99)
POTASSIUM: 4.9 mmol/L (ref 3.5–5.2)
Sodium: 140 mmol/L (ref 134–144)

## 2016-09-17 NOTE — Telephone Encounter (Signed)
New Message  Pt daughter call requesting to speak with RN about appt on yesterday. The pt and wife did not fully understand everything that was stated in the appt and the daughter would like to get clarity. please call back to discuss

## 2016-09-17 NOTE — Telephone Encounter (Signed)
Returned pts's niece, Tea's call. She has been made aware that we don't have a DPR or POA on file for pt and due to that reason, we cannot release any information to anyone, without pts permission.  Kim in medical records will fax DPR over to them, with the understanding that the pt need to make his mark, since pt can't write or talk. She verbalized understanding.

## 2016-09-24 ENCOUNTER — Telehealth: Payer: Self-pay

## 2016-09-24 NOTE — Telephone Encounter (Signed)
**Note De-Identified Verlisa Vara Obfuscation** I have done a PA for Entresto over the phone with Trula Ore at [employer]. Per Trula Ore we can call [employer] ([phone]) in 24 hours to check status of PA. PA#: [id number]

## 2016-09-25 ENCOUNTER — Telehealth: Payer: Self-pay

## 2016-09-25 NOTE — Telephone Encounter (Signed)
Entresto approved by [place] tracks till 06/19/2017. PA #[id number]. Patient and local pharmacy notified.

## 2016-09-25 NOTE — Telephone Encounter (Signed)
Follow up    [phone] Domingo Cocking calling to check status

## 2016-09-25 NOTE — Telephone Encounter (Signed)
Spoke with Ms. Rayburn Ma, who is on patient's DPR. Advised her that Sherryll Burger was approved by [employer].

## 2016-09-28 ENCOUNTER — Other Ambulatory Visit: Payer: Self-pay | Admitting: Cardiology

## 2016-10-04 ENCOUNTER — Encounter: Payer: Self-pay | Admitting: Cardiovascular Disease

## 2016-10-04 ENCOUNTER — Ambulatory Visit (INDEPENDENT_AMBULATORY_CARE_PROVIDER_SITE_OTHER): Payer: Medicaid Other | Admitting: Cardiovascular Disease

## 2016-10-04 ENCOUNTER — Encounter: Payer: Self-pay | Admitting: *Deleted

## 2016-10-04 VITALS — BP 122/70 | HR 67 | Ht 72.0 in | Wt 156.8 lb

## 2016-10-04 DIAGNOSIS — I428 Other cardiomyopathies: Secondary | ICD-10-CM | POA: Diagnosis not present

## 2016-10-04 DIAGNOSIS — I2510 Atherosclerotic heart disease of native coronary artery without angina pectoris: Secondary | ICD-10-CM

## 2016-10-04 DIAGNOSIS — I483 Typical atrial flutter: Secondary | ICD-10-CM

## 2016-10-04 DIAGNOSIS — E785 Hyperlipidemia, unspecified: Secondary | ICD-10-CM | POA: Diagnosis not present

## 2016-10-04 MED ORDER — APIXABAN 5 MG PO TABS: 5.0000 mg | ORAL_TABLET | Freq: Two times a day (BID) | ORAL | 6 refills | Status: DC

## 2016-10-04 NOTE — Patient Instructions (Signed)
Medication Instructions:  Your physician recommends that you continue on your current medications as directed. Please refer to the Current Medication list given to you today.   Labwork: Lab work to be done today--BMP, CBC  Testing/Procedures: Your physician has recommended that you have a Cardioversion (DCCV). Electrical Cardioversion uses a jolt of electricity to your heart either through paddles or wired patches attached to your chest. This is a controlled, usually prescheduled, procedure. Defibrillation is done under light anesthesia in the hospital, and you usually go home the day of the procedure. This is done to get your heart back into a normal rhythm. You are not awake for the procedure. Please see the instruction sheet given to you today.  Scheduled for October 4,2018  Follow-Up: You have a follow up appointment with Jacolyn Reedy, PA on October 22,2018 at 1:15  Any Other Special Instructions Will Be Listed Below (If Applicable).     If you need a refill on your cardiac medications before your next appointment, please call your pharmacy.

## 2016-10-04 NOTE — Progress Notes (Signed)
Chief Complaint  Patient presents with  . Follow-up    atrial flutter   History of Present Illness: 61 yo male with history of CVA in 2010, expressive aphasia, chronic right sided weakness, HTN, DM and atrial flutter who is here today for cardiac follow up. He was admitted to [facility] 09/04/16 with atrial flutter and was found to have LVEF=20%. Cardiac cath 09/06/16 with mild non-obstructive CAD. He was felt to have a non-ischemic cardiomyopathy. His heart rate was controlled on a low dose beta blocker. He was started on Eliquis. He was also started on Entresto. He was seen in our office by Herma Carson, PA-C on 09/16/16 and was doing well. Plans for DCCV after one month of anti-coagulation.   He is here today for follow up. The patient denies any chest pain, dyspnea, palpitations, lower extremity edema, orthopnea, PND, dizziness, near syncope or syncope. He is here today with his niece. He feels well.    Primary Care Physician: Mirna Mires, MD  Past Medical History:  Diagnosis Date  . Atrial flutter (HCC)   . Diabetes mellitus   . Hypertension   . Stroke [facility])     Past Surgical History:  Procedure Laterality Date  . RIGHT/LEFT HEART CATH AND CORONARY ANGIOGRAPHY N/A 09/06/2016   Procedure: RIGHT/LEFT HEART CATH AND CORONARY ANGIOGRAPHY;  Surgeon: Lennette Bihari, MD;  Location: [facility];  Service: Cardiovascular;  Laterality: N/A;  . TRACHEOSTOMY      Current Outpatient Prescriptions  Medication Sig Dispense Refill  . acetaminophen (TYLENOL) 500 MG tablet Take 1,000 mg by mouth every 6 (six) hours as needed for pain.    Marland Kitchen aspirin EC 81 MG EC tablet Take 1 tablet (81 mg total) by mouth daily.    . carvedilol (COREG) 3.125 MG tablet Take 1 tablet (3.125 mg total) by mouth 2 (two) times daily with a meal. 60 tablet 6  . ELIQUIS 5 MG TABS tablet TAKE 1 TABLET BY MOUTH TWICE A DAY 180 tablet 1  . furosemide (LASIX) 20 MG tablet Take 1 tablet (20 mg total) by mouth daily. 30  tablet 6  . glipiZIDE (GLUCOTROL) 10 MG tablet Take 1 tablet (10 mg total) by mouth daily with breakfast. 30 tablet 6  . loratadine (CLARITIN) 10 MG tablet Take 1 tablet (10 mg total) by mouth daily. 30 tablet 6  . metFORMIN (GLUCOPHAGE) 1000 MG tablet Take 1,000 mg by mouth 2 (two) times daily.    . Multiple Vitamin (MULTIVITAMIN WITH MINERALS) TABS Take 1 tablet by mouth daily.    Marland Kitchen omega-3 acid ethyl esters (LOVAZA) 1 G capsule Take 1 g by mouth daily.    . sacubitril-valsartan (ENTRESTO) 49-51 MG Take 1 tablet by mouth 2 (two) times daily. 60 tablet 5  . simvastatin (ZOCOR) 40 MG tablet Take 40 mg by mouth daily.     No current facility-administered medications for this visit.     No Known Allergies  Social History   Social History  . Marital status: Single    Spouse name: N/A  . Number of children: N/A  . Years of education: N/A   Occupational History  . Not on file.   Social History Main Topics  . Smoking status: Never Smoker  . Smokeless tobacco: Never Used  . Alcohol use No  . Drug use: No  . Sexual activity: Not Currently   Other Topics Concern  . Not on file   Social History Narrative  . No narrative on file  History reviewed. No pertinent family history.  Review of Systems:  As stated in the HPI and otherwise negative.   BP 122/70   Pulse 67   Ht 6' (1.829 m)   Wt 156 lb 12.8 oz (71.1 kg)   SpO2 98%   BMI 21.27 kg/m   Physical Examination: General: Well developed, well nourished, NAD  HEENT: OP clear, mucus membranes moist  SKIN: warm, dry. No rashes. Neuro: Left arm weakness, expressive aphasia.  Musculoskeletal: Muscle strength 5/5 all ext  Psychiatric: Mood and affect normal  Neck: No JVD, no carotid bruits, no thyromegaly, no lymphadenopathy.  Lungs:Clear bilaterally, no wheezes, rhonci, crackles Cardiovascular: Irreg irreg. No murmurs.  Abdomen:Soft. Bowel sounds present. Non-tender.  Extremities: No lower extremity edema. Pulses are 2 +  in the bilateral DP/PT.  Echo 09/05/16: Left ventricle: The cavity size was normal. Wall thickness was   increased in a pattern of moderate LVH. Systolic function was   severely reduced. The estimated ejection fraction was in the   range of 20% to 25%. Diffuse hypokinesis. Akinesis of the   anteroseptal myocardium. - Aortic valve: Transvalvular velocity was within the normal range.   There was no stenosis. There was no regurgitation. - Mitral valve: Transvalvular velocity was within the normal range.   There was no evidence for stenosis. There was trivial   regurgitation. - Left atrium: The atrium was severely dilated. - Right ventricle: The cavity size was normal. Wall thickness was   normal. Systolic function was mildly reduced. - Right atrium: The atrium was severely dilated. - Atrial septum: No defect or patent foramen ovale was identified   by color flow Doppler. - Tricuspid valve: There was mild regurgitation. - Pulmonary arteries: Systolic pressure was moderately increased.   PA peak pressure: 49 mm Hg (S).  Cardiac cath 09/06/16: Diagnostic Diagram        EKG:  EKG is ordered today. The ekg ordered today demonstrates Atrial flutter, rate 49 bpm. Non-specific ST and T wave abn  Recent Labs: 09/07/2016: Magnesium 1.9 09/09/2016: B Natriuretic Peptide 167.3; Hemoglobin 15.2; Platelets 264 09/16/2016: BUN 11; Creatinine, Ser 1.01; Potassium 4.9; Sodium 140   Lipid Panel    Component Value Date/Time   CHOL 121 07/07/2007 2028   TRIG 140 07/07/2007 2028   HDL 31 (L) 07/07/2007 2028   CHOLHDL 3.9 Ratio 07/07/2007 2028   VLDL 28 07/07/2007 2028   LDLCALC 62 07/07/2007 2028     Wt Readings from Last 3 Encounters:  10/04/16 156 lb 12.8 oz (71.1 kg)  09/16/16 154 lb 6.4 oz (70 kg)  09/09/16 153 lb 8 oz (69.6 kg)     Other studies Reviewed: Additional studies/ records that were reviewed today include: . Review of the above records demonstrates:   Assessment and Plan:     1. Atrial flutter: Rate controlled on Coreg. He is anti-coagulated on Eliquis. This was started on 09/07/16. Will plan DCCV at [facility] October 10, 2016. I think restoring sinus rhythm may be beneficial for his cardiomyopathy. Likely that he may not be able to maintain sinus given his bi-atrial enlargement.   2. Non-ischemic cardiomyopathy: He is on good medical therapy. Will continue Coreg and Entresto. Volume status is ok today.   3. HLD: continue statin. He will need lipids and LFTS in 8 weeks.   4. CAD without angina: No chest pain. Continue medical management of mild CAD with ASA, statin and beta blocker.   Current medicines are reviewed at length with the patient  today.  The patient does not have concerns regarding medicines.  The following changes have been made:  no change  Labs/ tests ordered today include:   Orders Placed This Encounter  Procedures  . CBC  . Basic Metabolic Panel (BMET)     Disposition:   FU with office APP in 2-3 weeks post DCCV.    Signed, Verne Carrow, MD 10/04/2016 4:32 PM    [facility] [address] [place], [place], [place]  [address] Phone: [phone]; Fax: [phone]

## 2016-10-05 ENCOUNTER — Other Ambulatory Visit: Payer: Self-pay | Admitting: Cardiology

## 2016-10-05 LAB — BASIC METABOLIC PANEL
BUN / CREAT RATIO: 10 (ref 10–24)
BUN: 10 mg/dL (ref 8–27)
CO2: 21 mmol/L (ref 20–29)
Calcium: 10.1 mg/dL (ref 8.6–10.2)
Chloride: 102 mmol/L (ref 96–106)
Creatinine, Ser: 0.97 mg/dL (ref 0.76–1.27)
GFR, EST AFRICAN AMERICAN: 98 mL/min/{1.73_m2} (ref >59)
GFR, EST NON AFRICAN AMERICAN: 84 mL/min/{1.73_m2} (ref >59)
GLUCOSE: 66 mg/dL (ref 65–99)
Potassium: 4 mmol/L (ref 3.5–5.2)
SODIUM: 143 mmol/L (ref 134–144)

## 2016-10-05 LAB — CBC
HEMATOCRIT: 45.9 % (ref 37.5–51.0)
HEMOGLOBIN: 14.9 g/dL (ref 13.0–17.7)
MCH: 28.8 pg (ref 26.6–33.0)
MCHC: 32.5 g/dL (ref 31.5–35.7)
MCV: 89 fL (ref 79–97)
Platelets: 195 10*3/uL (ref 150–379)
RBC: 5.17 x10E6/uL (ref 4.14–5.80)
RDW: 15.4 % (ref 12.3–15.4)
WBC: 9.1 10*3/uL (ref 3.4–10.8)

## 2016-10-06 ENCOUNTER — Other Ambulatory Visit: Payer: Self-pay | Admitting: Cardiology

## 2016-10-07 NOTE — Addendum Note (Signed)
Addended by: Vernard Gambles on: 10/07/2016 12:36 PM   Modules accepted: Orders

## 2016-10-09 NOTE — Telephone Encounter (Signed)
sacubitril-valsartan (ENTRESTO) 49-51 MG  Medication  Date: 09/16/2016 Department: [facility] Office Ordering/Authorizing: Dyann Kief, PA-C  Order Providers   Prescribing Provider Encounter Provider  Dyann Kief, PA-C Dyann Kief, PA-C  Supervision Information   Supervising Provider Type of Supervision  Kathleene Hazel, MD Incident To  Medication Detail    Disp Refills Start End   sacubitril-valsartan (ENTRESTO) 49-51 MG 60 tablet 5 09/16/2016    Sig - Route: Take 1 tablet by mouth 2 (two) times daily. - Oral   Sent to pharmacy as: sacubitril-valsartan (ENTRESTO) 49-51 MG   E-Prescribing Status: Receipt confirmed by pharmacy (09/16/2016 1:10 PM EDT)   Pharmacy   [facility] - [place], [place] - [address]   Pt has refill son file with [facility]. If patients wants filled at [facility] will need to call [facility] and have RX switched over

## 2016-10-10 ENCOUNTER — Other Ambulatory Visit: Payer: Self-pay | Admitting: Cardiovascular Disease

## 2016-10-10 ENCOUNTER — Encounter (HOSPITAL_COMMUNITY)
Admission: RE | Disposition: A | Discharge status: Home / Self Care | Payer: Self-pay | Source: Ambulatory Visit | Attending: Cardiology

## 2016-10-10 ENCOUNTER — Ambulatory Visit (HOSPITAL_COMMUNITY): Payer: Medicaid Other | Admitting: Certified Registered Nurse Anesthetist

## 2016-10-10 ENCOUNTER — Ambulatory Visit (HOSPITAL_COMMUNITY)
Admission: RE | Admit: 2016-10-10 | Discharge: 2016-10-10 | Disposition: A | Discharge status: Home / Self Care | Payer: Medicaid Other | Source: Ambulatory Visit | Attending: Cardiology | Admitting: Cardiology

## 2016-10-10 ENCOUNTER — Encounter (HOSPITAL_COMMUNITY): Payer: Self-pay | Admitting: Certified Registered Nurse Anesthetist

## 2016-10-10 DIAGNOSIS — I429 Cardiomyopathy, unspecified: Secondary | ICD-10-CM | POA: Insufficient documentation

## 2016-10-10 DIAGNOSIS — I2510 Atherosclerotic heart disease of native coronary artery without angina pectoris: Secondary | ICD-10-CM | POA: Diagnosis not present

## 2016-10-10 DIAGNOSIS — E785 Hyperlipidemia, unspecified: Secondary | ICD-10-CM | POA: Diagnosis not present

## 2016-10-10 DIAGNOSIS — Z79899 Other long term (current) drug therapy: Secondary | ICD-10-CM | POA: Insufficient documentation

## 2016-10-10 DIAGNOSIS — Z7982 Long term (current) use of aspirin: Secondary | ICD-10-CM | POA: Insufficient documentation

## 2016-10-10 DIAGNOSIS — I69320 Aphasia following cerebral infarction: Secondary | ICD-10-CM | POA: Insufficient documentation

## 2016-10-10 DIAGNOSIS — I4892 Unspecified atrial flutter: Secondary | ICD-10-CM | POA: Diagnosis not present

## 2016-10-10 DIAGNOSIS — I10 Essential (primary) hypertension: Secondary | ICD-10-CM | POA: Insufficient documentation

## 2016-10-10 DIAGNOSIS — E119 Type 2 diabetes mellitus without complications: Secondary | ICD-10-CM | POA: Insufficient documentation

## 2016-10-10 DIAGNOSIS — I481 Persistent atrial fibrillation: Secondary | ICD-10-CM | POA: Insufficient documentation

## 2016-10-10 DIAGNOSIS — Z791 Long term (current) use of non-steroidal anti-inflammatories (NSAID): Secondary | ICD-10-CM | POA: Insufficient documentation

## 2016-10-10 DIAGNOSIS — Z7984 Long term (current) use of oral hypoglycemic drugs: Secondary | ICD-10-CM | POA: Insufficient documentation

## 2016-10-10 HISTORY — PX: CARDIOVERSION: SHX1299

## 2016-10-10 SURGERY — CARDIOVERSION
Anesthesia: General

## 2016-10-10 MED ORDER — LIDOCAINE 2% (20 MG/ML) 5 ML SYRINGE
INTRAMUSCULAR | Status: DC | PRN
  Administered 2016-10-10: 80 mg via INTRAVENOUS

## 2016-10-10 MED ORDER — SODIUM CHLORIDE 0.9 % IV SOLN
INTRAVENOUS | Status: DC
  Administered 2016-10-10: 13:00:00 via INTRAVENOUS

## 2016-10-10 MED ORDER — PROPOFOL 10 MG/ML IV BOLUS
INTRAVENOUS | Status: DC | PRN
  Administered 2016-10-10: 100 mg via INTRAVENOUS

## 2016-10-10 NOTE — Transfer of Care (Signed)
Immediate Anesthesia Transfer of Care Note  Patient: [name]  Procedure(s) Performed: CARDIOVERSION (N/A )  Patient Location: Endoscopy Unit  Anesthesia Type:General  Level of Consciousness: awake, alert  and oriented  Airway & Oxygen Therapy: Patient Spontanous Breathing  Post-op Assessment: Report given to RN and Post -op Vital signs reviewed and stable  Post vital signs: Reviewed and stable  Last Vitals:  Vitals:   10/10/16 1400 10/10/16 1401  BP: (!) 89/59   Pulse: (!) 59 (!) 35  Resp: (!) 21 20  SpO2: 100% 99%    Last Pain: There were no vitals filed for this visit.       Complications: No apparent anesthesia complications

## 2016-10-10 NOTE — Procedures (Signed)
Electrical Cardioversion Procedure Note [name] [id number] 07-18-1955  Procedure: Electrical Cardioversion Indications:  Atrial Fibrillation and Atrial Flutter  Procedure Details Consent: Risks of procedure as well as the alternatives and risks of each were explained to the (patient/caregiver).  Consent for procedure obtained. Time Out: Verified patient identification, verified procedure, site/side was marked, verified correct patient position, special equipment/implants available, medications/allergies/relevent history reviewed, required imaging and test results available.  Performed  Patient placed on cardiac monitor, pulse oximetry, supplemental oxygen as necessary.  Sedation given: Pt sedated by anesthesia with lidocaine 80 mg and diprovan 100 mg IV. Pacer pads placed anterior and posterior chest.  Cardioverted 1 time(s).  Cardioverted at 120J.  Evaluation Findings: Post procedure EKG shows: Sinus bradycardia with first degree AV block. Complications: None Patient did tolerate procedure well.   [name] 10/10/2016, 12:38 PM

## 2016-10-10 NOTE — Interval H&P Note (Signed)
History and Physical Interval Note:  10/10/2016 12:39 PM  [name]  has presented today for surgery, with the diagnosis of A-FIB  The various methods of treatment have been discussed with the patient and family. After consideration of risks, benefits and other options for treatment, the patient has consented to  Procedure(s): CARDIOVERSION (N/A) as a surgical intervention .  The patient's history has been reviewed, patient examined, no change in status, stable for surgery.  I have reviewed the patient's chart and labs.  Questions were answered to the patient's satisfaction.     [name]

## 2016-10-10 NOTE — Anesthesia Preprocedure Evaluation (Signed)
Anesthesia Evaluation  Patient identified by MRN, date of birth, ID band Patient awake    Reviewed: Allergy & Precautions, NPO status , Patient's Chart, lab work & pertinent test results  Airway Mallampati: II  TM Distance: >3 FB Neck ROM: Full    Dental no notable dental hx.    Pulmonary neg pulmonary ROS,    Pulmonary exam normal breath sounds clear to auscultation       Cardiovascular hypertension, Pt. on medications negative cardio ROS Normal cardiovascular exam+ dysrhythmias Atrial Fibrillation  Rhythm:Regular Rate:Normal     Neuro/Psych CVA, Residual Symptoms negative neurological ROS  negative psych ROS   GI/Hepatic negative GI ROS, Neg liver ROS,   Endo/Other  negative endocrine ROSdiabetes, Type 2, Oral Hypoglycemic Agents  Renal/GU negative Renal ROS  negative genitourinary   Musculoskeletal negative musculoskeletal ROS (+)   Abdominal   Peds negative pediatric ROS (+)  Hematology negative hematology ROS (+)   Anesthesia Other Findings   Reproductive/Obstetrics negative OB ROS                             Anesthesia Physical Anesthesia Plan  ASA: III  Anesthesia Plan: General   Post-op Pain Management:    Induction: Intravenous  PONV Risk Score and Plan: 2 and Treatment may vary due to age or medical condition  Airway Management Planned: Mask  Additional Equipment:   Intra-op Plan:   Post-operative Plan:   Informed Consent: I have reviewed the patients History and Physical, chart, labs and discussed the procedure including the risks, benefits and alternatives for the proposed anesthesia with the patient or authorized representative who has indicated his/her understanding and acceptance.   Dental advisory given  Plan Discussed with: CRNA  Anesthesia Plan Comments:         Anesthesia Quick Evaluation

## 2016-10-10 NOTE — Anesthesia Postprocedure Evaluation (Signed)
Anesthesia Post Note  Patient: [name]  Procedure(s) Performed: CARDIOVERSION (N/A )     Patient location during evaluation: PACU Anesthesia Type: General Level of consciousness: awake and alert Pain management: pain level controlled Vital Signs Assessment: post-procedure vital signs reviewed and stable Respiratory status: spontaneous breathing, nonlabored ventilation and respiratory function stable Cardiovascular status: blood pressure returned to baseline and stable Postop Assessment: no apparent nausea or vomiting Anesthetic complications: no    Last Vitals:  Vitals:   10/10/16 1425 10/10/16 1430  BP: 101/64   Pulse: (!) 52 (!) 57  Resp: (!) 21 (!) 21  Temp:    SpO2: 96% 97%    Last Pain:  Vitals:   10/10/16 1406  TempSrc: Oral                 Lowella Curb

## 2016-10-10 NOTE — Discharge Instructions (Signed)
Electrical Cardioversion, Care After °This sheet gives you information about how to care for yourself after your procedure. Your health care provider may also give you more specific instructions. If you have problems or questions, contact your health care provider. °What can I expect after the procedure? °After the procedure, it is common to have: °· Some redness on the skin where the shocks were given. ° °Follow these instructions at home: °· Do not drive for 24 hours if you were given a medicine to help you relax (sedative). °· Take over-the-counter and prescription medicines only as told by your health care provider. °· Ask your health care provider how to check your pulse. Check it often. °· Rest for 48 hours after the procedure or as told by your health care provider. °· Avoid or limit your caffeine use as told by your health care provider. °Contact a health care provider if: °· You feel like your heart is beating too quickly or your pulse is not regular. °· You have a serious muscle cramp that does not go away. °Get help right away if: °· You have discomfort in your chest. °· You are dizzy or you feel faint. °· You have trouble breathing or you are short of breath. °· Your speech is slurred. °· You have trouble moving an arm or leg on one side of your body. °· Your fingers or toes turn cold or blue. °This information is not intended to replace advice given to you by your health care provider. Make sure you discuss any questions you have with your health care provider. °Document Released: 10/14/2012 Document Revised: 07/28/2015 Document Reviewed: 06/30/2015 °Elsevier Interactive Patient Education © 2018 Elsevier Inc. ° °

## 2016-10-10 NOTE — H&P (Signed)
Office Visit   10/04/2016 [facility]  Kathleene Hazel, MD  Cardiology   Non-ischemic cardiomyopathy [facility]) +3 more  Dx   Follow-up ; Referred by Mirna Mires, MD  Reason for Visit   Additional Documentation   Vitals:   BP 122/70   Pulse 67   Ht 6' (1.829 m)   Wt 71.1 kg (156 lb 12.8 oz)   SpO2 98%   BMI 21.27 kg/m   BSA 1.9 m      More Vitals   Flowsheets:   Custom Formula Data,   Anthropometrics,   MEWS Score     Encounter Info:   Billing Info,   History,   Allergies,   Detailed Report     All Notes   Procedures by Kathleene Hazel, MD at 10/08/2016 11:16 AM   Author: Kathleene Hazel, MD Author Type: Physician Filed: 10/08/2016 11:16 AM  Note Status: Signed Cosign: Cosign Not Required Encounter Date: 10/04/2016  Editor: Roderic Ovens L        Scan on 10/08/2016 11:16 AM by Roderic Ovens L : Ekg - [facility]  Addendum Note by Vernard Gambles, CMA at 10/04/2016 4:00 PM   Author: Vernard Gambles, CMA Author Type: Certified Medical Assistant Filed: 10/07/2016 12:36 PM  Note Status: Signed Cosign: Cosign Not Required Encounter Date: 10/04/2016  Editor: Vernard Gambles, CMA (Certified Medical Assistant)    Addended by: Vernard Gambles on: 10/07/2016 12:36 PM   Modules accepted: Orders     Progress Notes by Kathleene Hazel, MD at 10/04/2016 4:00 PM   Author: Kathleene Hazel, MD Author Type: Physician Filed: 10/04/2016 5:22 PM  Note Status: Signed Cosign: Cosign Not Required Encounter Date: 10/04/2016  Editor: Kathleene Hazel, MD (Physician)  Expand All Collapse All         Chief Complaint  Patient presents with  . Follow-up    atrial flutter   History of Present Illness: 61 yo male with history of CVA in 2010, expressive aphasia, chronic right sided weakness, HTN, DM and atrial flutter who is here today for cardiac follow up. He was admitted to [facility] 09/04/16 with atrial flutter and  was found to have LVEF=20%. Cardiac cath 09/06/16 with mild non-obstructive CAD. He was felt to have a non-ischemic cardiomyopathy. His heart rate was controlled on a low dose beta blocker. He was started on Eliquis. He was also started on Entresto. He was seen in our office by Herma Carson, PA-C on 09/16/16 and was doing well. Plans for DCCV after one month of anti-coagulation.   He is here today for follow up. The patient denies any chest pain, dyspnea, palpitations, lower extremity edema, orthopnea, PND, dizziness, near syncope or syncope. He is here today with his niece. He feels well.    Primary Care Physician: Mirna Mires, MD      Past Medical History:  Diagnosis Date  . Atrial flutter (HCC)   . Diabetes mellitus   . Hypertension   . Stroke [facility])          Past Surgical History:  Procedure Laterality Date  . RIGHT/LEFT HEART CATH AND CORONARY ANGIOGRAPHY N/A 09/06/2016   Procedure: RIGHT/LEFT HEART CATH AND CORONARY ANGIOGRAPHY;  Surgeon: Lennette Bihari, MD;  [facility]: [facility] INVASIVE CV LAB;  Service: Cardiovascular;  Laterality: N/A;  . TRACHEOSTOMY            Current Outpatient Prescriptions  Medication Sig Dispense Refill  . acetaminophen (TYLENOL) 500  MG tablet Take 1,000 mg by mouth every 6 (six) hours as needed for pain.    Marland Kitchen aspirin EC 81 MG EC tablet Take 1 tablet (81 mg total) by mouth daily.    . carvedilol (COREG) 3.125 MG tablet Take 1 tablet (3.125 mg total) by mouth 2 (two) times daily with a meal. 60 tablet 6  . ELIQUIS 5 MG TABS tablet TAKE 1 TABLET BY MOUTH TWICE A DAY 180 tablet 1  . furosemide (LASIX) 20 MG tablet Take 1 tablet (20 mg total) by mouth daily. 30 tablet 6  . glipiZIDE (GLUCOTROL) 10 MG tablet Take 1 tablet (10 mg total) by mouth daily with breakfast. 30 tablet 6  . loratadine (CLARITIN) 10 MG tablet Take 1 tablet (10 mg total) by mouth daily. 30 tablet 6  . metFORMIN (GLUCOPHAGE) 1000 MG tablet Take 1,000 mg by mouth 2 (two)  times daily.    . Multiple Vitamin (MULTIVITAMIN WITH MINERALS) TABS Take 1 tablet by mouth daily.    Marland Kitchen omega-3 acid ethyl esters (LOVAZA) 1 G capsule Take 1 g by mouth daily.    . sacubitril-valsartan (ENTRESTO) 49-51 MG Take 1 tablet by mouth 2 (two) times daily. 60 tablet 5  . simvastatin (ZOCOR) 40 MG tablet Take 40 mg by mouth daily.     No current facility-administered medications for this visit.     No Known Allergies  Social History        Social History  . Marital status: Single    Spouse name: N/A  . Number of children: N/A  . Years of education: N/A      Occupational History  . Not on file.       Social History Main Topics  . Smoking status: Never Smoker  . Smokeless tobacco: Never Used  . Alcohol use No  . Drug use: No  . Sexual activity: Not Currently       Other Topics Concern  . Not on file      Social History Narrative  . No narrative on file    History reviewed. No pertinent family history.  Review of Systems:  As stated in the HPI and otherwise negative.   BP 122/70   Pulse 67   Ht 6' (1.829 m)   Wt 156 lb 12.8 oz (71.1 kg)   SpO2 98%   BMI 21.27 kg/m   Physical Examination: General: Well developed, well nourished, NAD  HEENT: OP clear, mucus membranes moist  SKIN: warm, dry. No rashes. Neuro: Left arm weakness, expressive aphasia.  Musculoskeletal: Muscle strength 5/5 all ext  Psychiatric: Mood and affect normal  Neck: No JVD, no carotid bruits, no thyromegaly, no lymphadenopathy.  Lungs:Clear bilaterally, no wheezes, rhonci, crackles Cardiovascular: Irreg irreg. No murmurs.  Abdomen:Soft. Bowel sounds present. Non-tender.  Extremities: No lower extremity edema. Pulses are 2 + in the bilateral DP/PT.  Echo 09/05/16: Left ventricle: The cavity size was normal. Wall thickness was increased in a pattern of moderate LVH. Systolic function was severely reduced. The estimated ejection fraction was in  the range of 20% to 25%. Diffuse hypokinesis. Akinesis of the anteroseptal myocardium. - Aortic valve: Transvalvular velocity was within the normal range. There was no stenosis. There was no regurgitation. - Mitral valve: Transvalvular velocity was within the normal range. There was no evidence for stenosis. There was trivial regurgitation. - Left atrium: The atrium was severely dilated. - Right ventricle: The cavity size was normal. Wall thickness was normal. Systolic function was mildly reduced. -  Right atrium: The atrium was severely dilated. - Atrial septum: No defect or patent foramen ovale was identified by color flow Doppler. - Tricuspid valve: There was mild regurgitation. - Pulmonary arteries: Systolic pressure was moderately increased. PA peak pressure: 49 mm Hg (S).  Cardiac cath 09/06/16: Diagnostic Diagram        EKG:  EKG is ordered today. The ekg ordered today demonstrates Atrial flutter, rate 49 bpm. Non-specific ST and T wave abn  Recent Labs: 09/07/2016: Magnesium 1.9 09/09/2016: B Natriuretic Peptide 167.3; Hemoglobin 15.2; Platelets 264 09/16/2016: BUN 11; Creatinine, Ser 1.01; Potassium 4.9; Sodium 140   Lipid Panel Labs (Brief)          Component Value Date/Time   CHOL 121 07/07/2007 2028   TRIG 140 07/07/2007 2028   HDL 31 (L) 07/07/2007 2028   CHOLHDL 3.9 Ratio 07/07/2007 2028   VLDL 28 07/07/2007 2028   LDLCALC 62 07/07/2007 2028          Wt Readings from Last 3 Encounters:  10/04/16 156 lb 12.8 oz (71.1 kg)  09/16/16 154 lb 6.4 oz (70 kg)  09/09/16 153 lb 8 oz (69.6 kg)     Other studies Reviewed: Additional studies/ records that were reviewed today include: . Review of the above records demonstrates:   Assessment and Plan:   1. Atrial flutter: Rate controlled on Coreg. He is anti-coagulated on Eliquis. This was started on 09/07/16. Will plan DCCV at [facility] October 10, 2016. I think restoring sinus rhythm may  be beneficial for his cardiomyopathy. Likely that he may not be able to maintain sinus given his bi-atrial enlargement.   2. Non-ischemic cardiomyopathy: He is on good medical therapy. Will continue Coreg and Entresto. Volume status is ok today.   3. HLD: continue statin. He will need lipids and LFTS in 8 weeks.   4. CAD without angina: No chest pain. Continue medical management of mild CAD with ASA, statin and beta blocker.   Current medicines are reviewed at length with the patient today.  The patient does not have concerns regarding medicines.  The following changes have been made:  no change  Labs/ tests ordered today include:      Orders Placed This Encounter  Procedures  . CBC  . Basic Metabolic Panel (BMET)     Disposition:   FU with office APP in 2-3 weeks post DCCV.    Signed, Verne Carrow, MD 10/04/2016 4:32 PM    [facility] [address] [place], [place], [place]  [address] Phone: [phone]; Fax: [phone]      For DCCV; no changes; pt compliant with apixaban. [name], MD

## 2016-10-11 ENCOUNTER — Encounter (HOSPITAL_COMMUNITY): Payer: Self-pay | Admitting: Cardiology

## 2016-10-11 LAB — POCT I-STAT, CHEM 8
BUN: 13 mg/dL (ref 6–20)
CHLORIDE: 105 mmol/L (ref 101–111)
Calcium, Ion: 1.09 mmol/L — ABNORMAL LOW (ref 1.15–1.40)
Creatinine, Ser: 1.1 mg/dL (ref 0.61–1.24)
Glucose, Bld: 106 mg/dL — ABNORMAL HIGH (ref 65–99)
HEMATOCRIT: 47 % (ref 39.0–52.0)
HEMOGLOBIN: 16 g/dL (ref 13.0–17.0)
POTASSIUM: 4.5 mmol/L (ref 3.5–5.1)
SODIUM: 141 mmol/L (ref 135–145)
TCO2: 28 mmol/L (ref 22–32)

## 2016-10-11 NOTE — Telephone Encounter (Signed)
Medication Detail    Disp Refills Start End   sacubitril-valsartan (ENTRESTO) 49-51 MG 60 tablet 5 09/16/2016    Sig - Route: Take 1 tablet by mouth 2 (two) times daily. - Oral   Sent to pharmacy as: sacubitril-valsartan (ENTRESTO) 49-51 MG   E-Prescribing Status: Receipt confirmed by pharmacy (09/16/2016 1:10 PM EDT)   Pharmacy   [facility] - [place], [place] - [address]

## 2016-10-28 ENCOUNTER — Ambulatory Visit (INDEPENDENT_AMBULATORY_CARE_PROVIDER_SITE_OTHER): Payer: Medicaid Other | Admitting: Physician Assistant

## 2016-10-28 ENCOUNTER — Encounter: Payer: Self-pay | Admitting: Physician Assistant

## 2016-10-28 VITALS — BP 110/60 | HR 66 | Ht 72.0 in | Wt 158.8 lb

## 2016-10-28 DIAGNOSIS — Z23 Encounter for immunization: Secondary | ICD-10-CM

## 2016-10-28 DIAGNOSIS — E785 Hyperlipidemia, unspecified: Secondary | ICD-10-CM

## 2016-10-28 DIAGNOSIS — Z8673 Personal history of transient ischemic attack (TIA), and cerebral infarction without residual deficits: Secondary | ICD-10-CM

## 2016-10-28 DIAGNOSIS — I483 Typical atrial flutter: Secondary | ICD-10-CM | POA: Diagnosis not present

## 2016-10-28 DIAGNOSIS — I420 Dilated cardiomyopathy: Secondary | ICD-10-CM | POA: Diagnosis not present

## 2016-10-28 NOTE — Progress Notes (Signed)
Cardiology Office Note    Date:  10/28/2016   ID:  [name], DOB 1955/07/22, MRN [id number]  PCP:  Mirna Mires, MD  Cardiologist: Dr. [name]  Chief Complaint  Patient presents with  . Follow-up    History of Present Illness:  [name] is a 61 y.o. male with history of CVA in 2010, expressive aphasia, chronic right sided weakness, HTN, DM and atrial flutter.He was admitted to [facility] 09/04/16 with atrial flutter and was found to have LVEF=20%. Cardiac cath 09/06/16 with mild non-obstructive CAD. He was felt to have a non-ischemic cardiomyopathy. His heart rate was controlled on a low dose beta blocker. He was started on Eliquis. He was also started on [facility] Dr. [name] 10/04/16 and set up for DCCV 10/10/16 which was successful.  Patient comes in today for follow-up accompanied by his niece. He is doing quite well. Denies any complaints of chest pain, palpitations, dyspnea, or edema. He is still in normal sinus rhythm.  Past Medical History:  Diagnosis Date  . Atrial flutter (HCC)   . Diabetes mellitus   . Hypertension   . Stroke [facility])     Past Surgical History:  Procedure Laterality Date  . CARDIOVERSION N/A 10/10/2016   Procedure: CARDIOVERSION;  Surgeon: Lewayne Bunting, MD;  Location: [facility];  Service: Cardiovascular;  Laterality: N/A;  . RIGHT/LEFT HEART CATH AND CORONARY ANGIOGRAPHY N/A 09/06/2016   Procedure: RIGHT/LEFT HEART CATH AND CORONARY ANGIOGRAPHY;  Surgeon: Lennette Bihari, MD;  Location: [facility] INVASIVE CV LAB;  Service: Cardiovascular;  Laterality: N/A;  . TRACHEOSTOMY      Current Medications: Current Meds  Medication Sig  . acetaminophen (TYLENOL) 500 MG tablet Take 1,000 mg by mouth every 6 (six) hours as needed for pain.  Marland Kitchen aspirin EC 81 MG EC tablet Take 1 tablet (81 mg total) by mouth daily.  . carvedilol (COREG) 3.125 MG tablet Take 1 tablet (3.125 mg total) by mouth 2 (two) times daily with a meal.  . ELIQUIS 5 MG TABS  tablet TAKE 1 TABLET BY MOUTH TWICE A DAY  . furosemide (LASIX) 20 MG tablet Take 1 tablet (20 mg total) by mouth daily.  Marland Kitchen glipiZIDE (GLUCOTROL) 10 MG tablet Take 1 tablet (10 mg total) by mouth daily with breakfast.  . loratadine (CLARITIN) 10 MG tablet Take 1 tablet (10 mg total) by mouth daily.  . metFORMIN (GLUCOPHAGE) 1000 MG tablet Take 1,000 mg by mouth 2 (two) times daily.  . Multiple Vitamin (MULTIVITAMIN WITH MINERALS) TABS Take 1 tablet by mouth daily.  Marland Kitchen omega-3 acid ethyl esters (LOVAZA) 1 G capsule Take 1 g by mouth daily.  . sacubitril-valsartan (ENTRESTO) 49-51 MG Take 1 tablet by mouth 2 (two) times daily.  . simvastatin (ZOCOR) 40 MG tablet Take 40 mg by mouth daily.     Allergies:   Patient has no known allergies.   Social History   Social History  . Marital status: Single    Spouse name: N/A  . Number of children: N/A  . Years of education: N/A   Social History Main Topics  . Smoking status: Never Smoker  . Smokeless tobacco: Never Used  . Alcohol use No  . Drug use: No  . Sexual activity: Not Currently   Other Topics Concern  . None   Social History Narrative  . None     Family History:  The patient's   family history includes Diabetes in his mother; Heart disease in his father; Hypertension  in his mother.   ROS:   Please see the history of present illness.    Review of Systems  Constitution: Negative.  HENT: Negative.   Cardiovascular: Negative.   Respiratory: Negative.   Endocrine: Negative.   Hematologic/Lymphatic: Negative.   Musculoskeletal: Negative.   Gastrointestinal: Negative.   Genitourinary: Negative.   Neurological: Negative.        Expressive aphasia from stroke    All other systems reviewed and are negative.   PHYSICAL EXAM:   VS:  BP 110/60   Pulse 66   Ht 6' (1.829 m)   Wt 158 lb 12.8 oz (72 kg)   SpO2 97%   BMI 21.54 kg/m   Physical Exam  GEN: Well nourished, well developed, in no acute distress  Neck: no JVD,  carotid bruits, or masses Cardiac:RRR; Distant heart sounds no murmurs, rubs, or gallops  Respiratory:  clear to auscultation bilaterally, normal work of breathing GI: soft, nontender, nondistended, + BS Ext: without cyanosis, clubbing, or edema, Good distal pulses bilaterally Neuro:  Alert and Oriented x 3, expressive aphasia Psych: euthymic mood, full affect  Wt Readings from Last 3 Encounters:  10/28/16 158 lb 12.8 oz (72 kg)  10/10/16 156 lb (70.8 kg)  10/04/16 156 lb 12.8 oz (71.1 kg)      Studies/Labs Reviewed:   EKG:  EKG is ordered today.  The ekg ordered today demonstrates Normal sinus rhythm with occasional PVC nonspecific ST-T wave changes, no acute change  Recent Labs: 09/07/2016: Magnesium 1.9 09/09/2016: B Natriuretic Peptide 167.3 10/04/2016: Platelets 195 10/10/2016: BUN 13; Creatinine, Ser 1.10; Hemoglobin 16.0; Potassium 4.5; Sodium 141   Lipid Panel    Component Value Date/Time   CHOL 121 07/07/2007 2028   TRIG 140 07/07/2007 2028   HDL 31 (L) 07/07/2007 2028   CHOLHDL 3.9 Ratio 07/07/2007 2028   VLDL 28 07/07/2007 2028   LDLCALC 62 07/07/2007 2028    Additional studies/ records that were reviewed today include:  Echo 09/05/16: - Left ventricle: The cavity size was normal. Wall thickness was   increased in a pattern of moderate LVH. Systolic function was   severely reduced. The estimated ejection fraction was in the   range of 20% to 25%. Diffuse hypokinesis. Akinesis of the   anteroseptal myocardium. - Aortic valve: Transvalvular velocity was within the normal range.   There was no stenosis. There was no regurgitation. - Mitral valve: Transvalvular velocity was within the normal range.   There was no evidence for stenosis. There was trivial   regurgitation. - Left atrium: The atrium was severely dilated. - Right ventricle: The cavity size was normal. Wall thickness was   normal. Systolic function was mildly reduced. - Right atrium: The atrium was  severely dilated. - Atrial septum: No defect or patent foramen ovale was identified   by color flow Doppler. - Tricuspid valve: There was mild regurgitation. - Pulmonary arteries: Systolic pressure was moderately increased.   PA peak pressure: 49 mm Hg (S).   Cardiac cath 09/06/16: Conclusion       Ost LAD lesion, 20 %stenosed.  Prox LAD lesion, 35 %stenosed.  Ost 1st Diag lesion, 40 %stenosed.  Ost 2nd Diag to 2nd Diag lesion, 20 %stenosed.   Normal to very minimal elevation of right heart pressures.   LVEDP 9 mm Hg   Mild nonobstructive CAD involving the LAD with 20% proximal stenosis, 30-40% stenosis in the region of the first diagonal takeoff with 40% stenosis in the diagonal-1 vessel, and  20% narrowing in the second diagonal vessel, with slow filling apically; normal left circumflex coronary artery and normal dominant RCA.   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   RECOMMENDATION: Guideline directed medical therapy for the patient's nonischemic cardiomyopathy                ASSESSMENT:    1. Typical atrial flutter (HCC)   2. Dilated cardiomyopathy (HCC)   3. History of stroke   4. Hyperlipidemia, unspecified hyperlipidemia type      PLAN:  In order of problems listed above:  Atrial flutter status post DC CV 10/10/16 to normal sinus rhythmAnd maintaining normal sinus rhythm.Continue Eliquis and low-dose Coreg. F/U with Dr. Clifton JamesMcAlhany in 2-3 months.  Dilated cardiomyopathy ejection fraction 20-25% hopefully will improve since cardioversion. On Entresto and I titrated to 49/51 09/16/16. Well compensated. We'll get repeat echo next month to see if there is any improvement while in normal sinus rhythm.  History CVA with expressive aphasia on Eliquis and low-dose aspirin  Hyperlipidemia on zocor  Medication Adjustments/Labs and Tests Ordered: Current medicines are reviewed at length with the patient today.  Concerns regarding medicines are  outlined above.  Medication changes, Labs and Tests ordered today are listed in the Patient Instructions below. There are no Patient Instructions on file for this visit.   Elson ClanSigned, Michele Lenze, PA-C  10/28/2016 1:50 PM    [name] [address], [place], [place]  [address] Phone: [phone](336) [phone]; Fax: [phone]) [phone]

## 2016-10-28 NOTE — Patient Instructions (Signed)
Medication Instructions:  Your physician recommends that you continue on your current medications as directed. Please refer to the Current Medication list given to you today.   Labwork: NONE ORDERED TODAY  Testing/Procedures: Your physician has requested that you have an echocardiogram. Echocardiography is a painless test that uses sound waves to create images of your heart. It provides your doctor with information about the size and shape of your heart and how well your heart's chambers and valves are working. This procedure takes approximately one hour. There are no restrictions for this procedure. THIS IS TO BE DONE IN November PER MICHELE Kensington, [facility]    Follow-Up: DR. [name] IN 2-3 MONTHS   Any Other Special Instructions Will Be Listed Below (If Applicable).     If you need a refill on your cardiac medications before your next appointment, please call your pharmacy.

## 2016-11-04 ENCOUNTER — Ambulatory Visit (INDEPENDENT_AMBULATORY_CARE_PROVIDER_SITE_OTHER): Payer: Medicaid Other | Admitting: Podiatry

## 2016-11-04 ENCOUNTER — Encounter: Payer: Self-pay | Admitting: Podiatry

## 2016-11-04 DIAGNOSIS — B351 Tinea unguium: Secondary | ICD-10-CM | POA: Diagnosis not present

## 2016-11-04 DIAGNOSIS — G629 Polyneuropathy, unspecified: Secondary | ICD-10-CM

## 2016-11-04 DIAGNOSIS — D689 Coagulation defect, unspecified: Secondary | ICD-10-CM

## 2016-11-04 DIAGNOSIS — M79676 Pain in unspecified toe(s): Secondary | ICD-10-CM | POA: Diagnosis not present

## 2016-11-04 DIAGNOSIS — E119 Type 2 diabetes mellitus without complications: Secondary | ICD-10-CM

## 2016-11-04 NOTE — Patient Instructions (Signed)

## 2016-11-04 NOTE — Progress Notes (Signed)
Patient ID: [name], male   DOB: 1955/07/25, 61 y.o.   MRN: [id number]    Subjective: This patient presents for scheduled visit complaining that his toenails are thick and elongated and uncomfortable when walking and wearing shoes and is requesting toenail debridement.The patient did not present for scheduled visit of 3 months after the visit of 05/10/2015  Objective: DP and PT pulses 2/4 bilaterally Capillary reflex immediate bilaterally Sensation to 10 g monofilament wire intact 0/5 bilaterally Vibratory sensation nonreactive right reactive left Ankle reflex reactive bilaterally No open skin lesions bilaterally The toenails are hypertrophic, elongated, deformed, discolored and tender drug palpation 6-10 HAV bilaterally Hammertoe fifth bilaterally Dorsi flexion 0/5 right and 5/5 left Plantar flexion 5/5 bilaterally  Assessment: 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

## 2016-11-22 ENCOUNTER — Other Ambulatory Visit: Payer: Self-pay

## 2016-11-22 ENCOUNTER — Other Ambulatory Visit: Payer: Self-pay | Admitting: Physician Assistant

## 2016-11-22 ENCOUNTER — Ambulatory Visit (HOSPITAL_COMMUNITY): Payer: Medicaid Other | Attending: Cardiology

## 2016-11-22 DIAGNOSIS — I071 Rheumatic tricuspid insufficiency: Secondary | ICD-10-CM | POA: Insufficient documentation

## 2016-11-22 DIAGNOSIS — I4892 Unspecified atrial flutter: Secondary | ICD-10-CM | POA: Insufficient documentation

## 2016-11-22 DIAGNOSIS — Z8673 Personal history of transient ischemic attack (TIA), and cerebral infarction without residual deficits: Secondary | ICD-10-CM | POA: Insufficient documentation

## 2016-11-22 DIAGNOSIS — E119 Type 2 diabetes mellitus without complications: Secondary | ICD-10-CM | POA: Diagnosis not present

## 2016-11-22 DIAGNOSIS — E785 Hyperlipidemia, unspecified: Secondary | ICD-10-CM | POA: Diagnosis not present

## 2016-11-22 DIAGNOSIS — I420 Dilated cardiomyopathy: Secondary | ICD-10-CM

## 2017-02-04 ENCOUNTER — Ambulatory Visit: Payer: Medicaid Other | Admitting: Podiatry

## 2017-02-10 ENCOUNTER — Encounter: Payer: Self-pay | Admitting: Podiatry

## 2017-02-10 ENCOUNTER — Ambulatory Visit: Payer: Medicaid Other | Admitting: Podiatry

## 2017-02-10 DIAGNOSIS — M79675 Pain in left toe(s): Secondary | ICD-10-CM

## 2017-02-10 DIAGNOSIS — M79674 Pain in right toe(s): Secondary | ICD-10-CM

## 2017-02-10 DIAGNOSIS — B351 Tinea unguium: Secondary | ICD-10-CM | POA: Diagnosis not present

## 2017-02-10 DIAGNOSIS — M79676 Pain in unspecified toe(s): Secondary | ICD-10-CM

## 2017-02-10 DIAGNOSIS — Q828 Other specified congenital malformations of skin: Secondary | ICD-10-CM

## 2017-02-10 DIAGNOSIS — D689 Coagulation defect, unspecified: Secondary | ICD-10-CM

## 2017-02-12 ENCOUNTER — Encounter: Payer: Self-pay | Admitting: Cardiovascular Disease

## 2017-02-12 ENCOUNTER — Ambulatory Visit (INDEPENDENT_AMBULATORY_CARE_PROVIDER_SITE_OTHER): Payer: Medicaid Other | Admitting: Cardiovascular Disease

## 2017-02-12 VITALS — BP 121/71 | HR 63 | Ht 72.0 in | Wt 164.8 lb

## 2017-02-12 DIAGNOSIS — I483 Typical atrial flutter: Secondary | ICD-10-CM | POA: Diagnosis not present

## 2017-02-12 DIAGNOSIS — I2510 Atherosclerotic heart disease of native coronary artery without angina pectoris: Secondary | ICD-10-CM

## 2017-02-12 DIAGNOSIS — E785 Hyperlipidemia, unspecified: Secondary | ICD-10-CM | POA: Diagnosis not present

## 2017-02-12 DIAGNOSIS — I428 Other cardiomyopathies: Secondary | ICD-10-CM | POA: Diagnosis not present

## 2017-02-12 NOTE — Progress Notes (Signed)
Subjective:   Patient ID: [name], male   DOB: 62 y.o.   MRN: [id number]   HPI Patient presents with severe thickness incurvation and pain of nails 1 through 5 of both feet that he cannot take care of   ROS      Objective:  Physical Exam  Neurovascular status intact with thick yellow brittle nailbeds 1-5 both feet with pain     Assessment:  Mycotic nail infection with pain 1-5 both feet     Plan:  Debride painful nailbeds 1-5 both feet with no iatrogenic bleeding noted

## 2017-02-12 NOTE — Patient Instructions (Signed)

## 2017-02-12 NOTE — Progress Notes (Signed)
Chief Complaint  Patient presents with  . Follow-up    Atrial flutter, non-ischemic cardiomyopathy   History of Present Illness: 62 yo male with history of CVA in 2010, expressive aphasia, chronic right sided weakness, HTN, DM and atrial flutter who is here today for cardiac follow up. He was admitted to [facility] 09/04/16 with atrial flutter and was found to have LVEF=20%. Cardiac cath 09/06/16 with mild non-obstructive CAD. He was felt to have a non-ischemic cardiomyopathy. His heart rate was controlled on a low dose beta blocker. He was started on Eliquis. He was also started on Entresto. DCCV at [facility] 10/10/16 and has maintained sinus rhythm since then. LVEF improved to 45-50% by echo November 2018.   He is here today for follow up. The patient denies any chest pain, dyspnea, palpitations, lower extremity edema, orthopnea, PND, dizziness, near syncope or syncope. He is here today with his niece. He feels well.    Primary Care Physician: Mirna Mires, MD  Past Medical History:  Diagnosis Date  . Atrial flutter (HCC)   . Diabetes mellitus   . Hypertension   . Stroke [facility])     Past Surgical History:  Procedure Laterality Date  . CARDIOVERSION N/A 10/10/2016   Procedure: CARDIOVERSION;  Surgeon: Lewayne Bunting, MD;  Location: [facility] ENDOSCOPY;  Service: Cardiovascular;  Laterality: N/A;  . RIGHT/LEFT HEART CATH AND CORONARY ANGIOGRAPHY N/A 09/06/2016   Procedure: RIGHT/LEFT HEART CATH AND CORONARY ANGIOGRAPHY;  Surgeon: Lennette Bihari, MD;  Location: [facility] INVASIVE CV LAB;  Service: Cardiovascular;  Laterality: N/A;  . TRACHEOSTOMY      Current Outpatient Medications  Medication Sig Dispense Refill  . acetaminophen (TYLENOL) 500 MG tablet Take 1,000 mg by mouth every 6 (six) hours as needed for pain.    Marland Kitchen aspirin EC 81 MG EC tablet Take 1 tablet (81 mg total) by mouth daily.    . carvedilol (COREG) 3.125 MG tablet Take 1 tablet (3.125 mg total) by mouth 2 (two) times daily with a meal. 60  tablet 6  . ELIQUIS 5 MG TABS tablet TAKE 1 TABLET BY MOUTH TWICE A DAY 60 tablet 5  . furosemide (LASIX) 20 MG tablet Take 1 tablet (20 mg total) by mouth daily. 30 tablet 6  . glipiZIDE (GLUCOTROL) 10 MG tablet Take 1 tablet (10 mg total) by mouth daily with breakfast. 30 tablet 6  . loratadine (CLARITIN) 10 MG tablet Take 1 tablet (10 mg total) by mouth daily. 30 tablet 6  . metFORMIN (GLUCOPHAGE) 1000 MG tablet Take 1,000 mg by mouth 2 (two) times daily.    . Multiple Vitamin (MULTIVITAMIN WITH MINERALS) TABS Take 1 tablet by mouth daily.    Marland Kitchen omega-3 acid ethyl esters (LOVAZA) 1 G capsule Take 1 g by mouth daily.    . sacubitril-valsartan (ENTRESTO) 49-51 MG Take 1 tablet by mouth 2 (two) times daily. 60 tablet 5  . simvastatin (ZOCOR) 40 MG tablet Take 40 mg by mouth daily.     No current facility-administered medications for this visit.     No Known Allergies  Social History   Socioeconomic History  . Marital status: Single    Spouse name: Not on file  . Number of children: Not on file  . Years of education: Not on file  . Highest education level: Not on file  Social Needs  . Financial resource strain: Not on file  . Food insecurity - worry: Not on file  . Food insecurity - inability: Not on  file  . Transportation needs - medical: Not on file  . Transportation needs - non-medical: Not on file  Occupational History  . Not on file  Tobacco Use  . Smoking status: Never Smoker  . Smokeless tobacco: Never Used  Substance and Sexual Activity  . Alcohol use: No  . Drug use: No  . Sexual activity: Not Currently  Other Topics Concern  . Not on file  Social History Narrative  . Not on file    Family History  Problem Relation Age of Onset  . Hypertension Mother   . Diabetes Mother   . Heart disease Father     Review of Systems:  As stated in the HPI and otherwise negative.   BP 121/71   Pulse 63   Ht 6' (1.829 m)   Wt 164 lb 12.8 oz (74.8 kg)   SpO2 98%   BMI  22.35 kg/m   Physical Examination:  General: Well developed, well nourished, NAD  HEENT: OP clear, mucus membranes moist  SKIN: warm, dry. No rashes. Neuro: No focal deficits  Musculoskeletal: Muscle strength 5/5 all ext  Psychiatric: Mood and affect normal  Neck: No JVD, no carotid bruits, no thyromegaly, no lymphadenopathy.  Lungs:Clear bilaterally, no wheezes, rhonci, crackles Cardiovascular: Regular rate and rhythm. No murmurs, gallops or rubs. Abdomen:Soft. Bowel sounds present. Non-tender.  Extremities: No lower extremity edema. Pulses are 2 + in the bilateral DP/PT.  Echo November 2018: - Left ventricle: The cavity size was normal. Wall thickness was   increased in a pattern of severe LVH. Systolic function was   mildly reduced. The estimated ejection fraction was in the range   of 45% to 50%. Diffuse hypokinesis. Doppler parameters are   consistent with abnormal left ventricular relaxation (grade 1   diastolic dysfunction). The E/e&' ratio is between 8-15,   suggesting indeterminate LV filling pressure. - Left atrium: The atrium was normal in size. - Tricuspid valve: There was mild regurgitation. - Pulmonary arteries: PA peak pressure: 29 mm Hg (S).   Cardiac cath 09/06/16: Diagnostic Diagram        EKG:  EKG is not ordered today. The ekg ordered today demonstrates   Recent Labs: 09/07/2016: Magnesium 1.9 09/09/2016: B Natriuretic Peptide 167.3 10/04/2016: Platelets 195 10/10/2016: BUN 13; Creatinine, Ser 1.10; Hemoglobin 16.0; Potassium 4.5; Sodium 141   Lipid Panel    Component Value Date/Time   CHOL 121 07/07/2007 2028   TRIG 140 07/07/2007 2028   HDL 31 (L) 07/07/2007 2028   CHOLHDL 3.9 Ratio 07/07/2007 2028   VLDL 28 07/07/2007 2028   LDLCALC 62 07/07/2007 2028     Wt Readings from Last 3 Encounters:  02/12/17 164 lb 12.8 oz (74.8 kg)  10/28/16 158 lb 12.8 oz (72 kg)  10/10/16 156 lb (70.8 kg)     Other studies Reviewed: Additional studies/  records that were reviewed today include: . Review of the above records demonstrates:   Assessment and Plan:   1. Atrial flutter: He appears to be in sinus today. Will continue coreg and Eliquis.    2. Non-ischemic cardiomyopathy: Will continue Coreg and Entresto. LVEF is now 45-50%.  No signs of volume overload.   3. HLD: Continue statin.   4. CAD without angina: No chest pain suggestive of angina. Will continue ASA, statin and beta blocker.    Current medicines are reviewed at length with the patient today.  The patient does not have concerns regarding medicines.  The following changes have been made:  no change  Labs/ tests ordered today include:   No orders of the defined types were placed in this encounter.    Disposition:   Follow up with me in 6 months   Signed, Verne Carrow, MD 02/13/2017 7:25 AM    [facility] [address] [place], [place], [place]  [address] Phone: [phone]; Fax: [phone]

## 2017-03-19 ENCOUNTER — Other Ambulatory Visit: Payer: Self-pay | Admitting: Physician Assistant

## 2017-03-21 ENCOUNTER — Telehealth: Payer: Self-pay

## 2017-03-21 NOTE — Telephone Encounter (Signed)
I have done an [place] PA over the phone with Morrie Sheldon at [facility]. Per Morrie Sheldon we can call [place] [employer] back at [phone] in 24 hours for decision on [place] PA.  PA# [id number] Reference # [id number]

## 2017-04-08 ENCOUNTER — Other Ambulatory Visit: Payer: Self-pay | Admitting: Cardiovascular Disease

## 2017-04-08 MED ORDER — CARVEDILOL 3.125 MG PO TABS: 3.1250 mg | ORAL_TABLET | Freq: Two times a day (BID) | ORAL | 10 refills | Status: DC

## 2017-04-08 MED ORDER — FUROSEMIDE 20 MG PO TABS: 20.0000 mg | ORAL_TABLET | Freq: Every day | ORAL | 10 refills | Status: DC

## 2017-04-08 NOTE — Telephone Encounter (Signed)
Pt's medications were sent to pt's pharmacy as requested. Confirmation received.  

## 2017-05-12 ENCOUNTER — Encounter: Payer: Self-pay | Admitting: Podiatry

## 2017-05-12 ENCOUNTER — Ambulatory Visit (INDEPENDENT_AMBULATORY_CARE_PROVIDER_SITE_OTHER): Payer: Medicaid Other | Admitting: Podiatry

## 2017-05-12 DIAGNOSIS — B351 Tinea unguium: Secondary | ICD-10-CM | POA: Diagnosis not present

## 2017-05-12 DIAGNOSIS — M79675 Pain in left toe(s): Secondary | ICD-10-CM

## 2017-05-12 DIAGNOSIS — M79674 Pain in right toe(s): Secondary | ICD-10-CM

## 2017-05-12 DIAGNOSIS — D689 Coagulation defect, unspecified: Secondary | ICD-10-CM

## 2017-05-14 NOTE — Progress Notes (Signed)
Subjective:   Patient ID: [name], male   DOB: 61 y.o.   MRN: [id number]   HPI Patient presents with thick nailbeds 1-5 both feet that are incurvated and sore and hard for the patient to cut with patient on blood thinner   ROS      Objective:  Physical Exam  Neurovascular status unchanged with thick yellow brittle nailbeds 1-5 both feet that are painful and history of blood thinners     Assessment:  At risk patient with mycotic nail infection 1-5 both feet with pain     Plan:  Debride painful nailbeds 1-5 both feet with no iatrogenic bleeding noted

## 2017-05-21 ENCOUNTER — Encounter: Payer: Self-pay | Admitting: Cardiovascular Disease

## 2017-07-03 ENCOUNTER — Other Ambulatory Visit: Payer: Self-pay | Admitting: Cardiovascular Disease

## 2017-07-03 ENCOUNTER — Other Ambulatory Visit: Payer: Self-pay | Admitting: *Deleted

## 2017-07-03 NOTE — Telephone Encounter (Signed)
Pt is a 62 yr old male. Last weight on 02/12/17 was 74.8Kg when pt saw Dr. [name] at office visit. SCr was 1.1 on 10/10/16. Will refill Eliquis 5mg  BID.

## 2017-07-03 NOTE — Telephone Encounter (Signed)
Age 62 years Wt 74.8kg 02/12/2017  Saw Dr [name] 02/12/2017  10/04/2016 Hgb 14.9 HCT 45.9  SrCr 0.97 Refill done for Eliquis 5mg  q 12 hours as requested

## 2017-07-15 ENCOUNTER — Encounter: Payer: Self-pay | Admitting: Physician Assistant

## 2017-07-15 ENCOUNTER — Ambulatory Visit (INDEPENDENT_AMBULATORY_CARE_PROVIDER_SITE_OTHER): Payer: Medicaid Other | Admitting: Physician Assistant

## 2017-07-15 ENCOUNTER — Encounter (INDEPENDENT_AMBULATORY_CARE_PROVIDER_SITE_OTHER): Payer: Self-pay

## 2017-07-15 VITALS — BP 112/62 | HR 66 | Ht 72.0 in | Wt 167.6 lb

## 2017-07-15 DIAGNOSIS — Z8673 Personal history of transient ischemic attack (TIA), and cerebral infarction without residual deficits: Secondary | ICD-10-CM

## 2017-07-15 DIAGNOSIS — E785 Hyperlipidemia, unspecified: Secondary | ICD-10-CM

## 2017-07-15 DIAGNOSIS — I10 Essential (primary) hypertension: Secondary | ICD-10-CM | POA: Insufficient documentation

## 2017-07-15 DIAGNOSIS — I483 Typical atrial flutter: Secondary | ICD-10-CM | POA: Diagnosis not present

## 2017-07-15 DIAGNOSIS — I420 Dilated cardiomyopathy: Secondary | ICD-10-CM | POA: Diagnosis not present

## 2017-07-15 MED ORDER — APIXABAN 5 MG PO TABS: 5.0000 mg | ORAL_TABLET | Freq: Two times a day (BID) | ORAL | 3 refills | Status: DC

## 2017-07-15 NOTE — Progress Notes (Signed)
Cardiology Office Note    Date:  07/15/2017   ID:  Nyquan Selbe, DOB 26-Oct-1955, MRN [id number]   PCP:  Mirna Mires, MD  Cardiologist: Verne Carrow, MD  Chief Complaint  Patient presents with  . Follow-up    History of Present Illness:  Anthony Tamburo is a 62 y.o. male with history of a CVA in 2010, expressive a aphasia, chronic right-sided weakness, atrial flutter on Eliquis status post DCCV 10/10/2016, hypertension, diabetes mellitus.  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  LVEF improved to 45 to 50% on echo 11/2016.  Last saw Dr. [name] 02/12/2017 at which time he was feeling well.  Patient comes in today accompanied by his brother and mother who he lives with.  He ran out of Eliquis several months ago and says the pharmacy will not fill it so he stopped taking it.  His biggest complaint is abdominal discomfort when he takes a deep breath.  He denies any chest pain, palpitations, dyspnea, dyspnea on exertion, dizziness or presyncope.  His mother thinks he is constipated.    Past Medical History:  Diagnosis Date  . Atrial flutter (HCC)   . Diabetes mellitus   . Hypertension   . Stroke [facility])     Past Surgical History:  Procedure Laterality Date  . CARDIOVERSION N/A 10/10/2016   Procedure: CARDIOVERSION;  Surgeon: Lewayne Bunting, MD;  Location: [facility] ENDOSCOPY;  Service: Cardiovascular;  Laterality: N/A;  . RIGHT/LEFT HEART CATH AND CORONARY ANGIOGRAPHY N/A 09/06/2016   Procedure: RIGHT/LEFT HEART CATH AND CORONARY ANGIOGRAPHY;  Surgeon: Lennette Bihari, MD;  Location: [facility] INVASIVE CV LAB;  Service: Cardiovascular;  Laterality: N/A;  . TRACHEOSTOMY      Current Medications: Current Meds  Medication Sig  . acetaminophen (TYLENOL) 500 MG tablet Take 1,000 mg by mouth every 6 (six) hours as needed for pain.  Marland Kitchen apixaban (ELIQUIS) 5 MG TABS tablet Take 1 tablet (5 mg total) by  mouth 2 (two) times daily.  Marland Kitchen aspirin EC 81 MG EC tablet Take 1 tablet (81 mg total) by mouth daily.  . carvedilol (COREG) 3.125 MG tablet Take 1 tablet (3.125 mg total) by mouth 2 (two) times daily with a meal.  . ENTRESTO 49-51 MG TAKE 1 TABLET BY MOUTH TWICE DAILY  . furosemide (LASIX) 20 MG tablet Take 1 tablet (20 mg total) by mouth daily.  Marland Kitchen glipiZIDE (GLUCOTROL) 10 MG tablet Take 1 tablet (10 mg total) by mouth daily with breakfast.  . loratadine (CLARITIN) 10 MG tablet Take 1 tablet (10 mg total) by mouth daily.  . metFORMIN (GLUCOPHAGE) 1000 MG tablet Take 1,000 mg by mouth 2 (two) times daily.  . Multiple Vitamin (MULTIVITAMIN WITH MINERALS) TABS Take 1 tablet by mouth daily.  Marland Kitchen omega-3 acid ethyl esters (LOVAZA) 1 G capsule Take 1 g by mouth daily.  . simvastatin (ZOCOR) 40 MG tablet Take 40 mg by mouth daily.  . [DISCONTINUED] apixaban (ELIQUIS) 5 MG TABS tablet Take 5 mg by mouth 2 (two) times daily.     Allergies:   Patient has no known allergies.   Social History   Socioeconomic History  . Marital status: Single    Spouse name: Not on file  . Number of children: Not on file  . Years of education: Not on file  . Highest education level: Not on file  Occupational History  . Not on file  Social Needs  . Financial  resource strain: Not on file  . Food insecurity:    Worry: Not on file    Inability: Not on file  . Transportation needs:    Medical: Not on file    Non-medical: Not on file  Tobacco Use  . Smoking status: Never Smoker  . Smokeless tobacco: Never Used  Substance and Sexual Activity  . Alcohol use: No  . Drug use: No  . Sexual activity: Not Currently  Lifestyle  . Physical activity:    Days per week: Not on file    Minutes per session: Not on file  . Stress: Not on file  Relationships  . Social connections:    Talks on phone: Not on file    Gets together: Not on file    Attends religious service: Not on file    Active member of club or  organization: Not on file    Attends meetings of clubs or organizations: Not on file    Relationship status: Not on file  Other Topics Concern  . Not on file  Social History Narrative  . Not on file     Family History:  The patient's   family history includes Diabetes in his mother; Heart disease in his father; Hypertension in his mother.   ROS:   Please see the history of present illness.    Review of Systems  Constitution: Negative.  HENT: Negative.   Cardiovascular: Negative.   Respiratory: Negative.   Endocrine: Negative.   Hematologic/Lymphatic: Negative.   Musculoskeletal: Negative.   Gastrointestinal: Positive for abdominal pain.  Genitourinary: Negative.   Neurological: Positive for focal weakness.       CVA with a aphasia and left-sided weakness   All other systems reviewed and are negative.   PHYSICAL EXAM:   VS:  BP 112/62 (BP Location: Left Arm, Patient Position: Sitting, Cuff Size: Normal)   Pulse 66   Ht 6' (1.829 m)   Wt 167 lb 9.6 oz (76 kg)   SpO2 96%   BMI 22.73 kg/m   Physical Exam  GEN: Well nourished, well developed, in no acute distress  Neck: no JVD, carotid bruits, or masses Cardiac: Irregular irregular, 1/6 systolic murmur in the left sternal border Respiratory:  clear to auscultation bilaterally, normal work of breathing GI: soft, nontender, nondistended, + BS Ext: without cyanosis, clubbing, or edema, Good distal pulses bilaterally Neuro:  Alert and Oriented x 3, aphasic Psych: euthymic mood, full affect  Wt Readings from Last 3 Encounters:  07/15/17 167 lb 9.6 oz (76 kg)  02/12/17 164 lb 12.8 oz (74.8 kg)  10/28/16 158 lb 12.8 oz (72 kg)      Studies/Labs Reviewed:   EKG:  EKG is  ordered today.  The ekg ordered today demonstrates atrial flutter at 49 bpm  Recent Labs: 09/07/2016: Magnesium 1.9 09/09/2016: B Natriuretic Peptide 167.3 10/04/2016: Platelets 195 10/10/2016: BUN 13; Creatinine, Ser 1.10; Hemoglobin 16.0; Potassium 4.5;  Sodium 141   Lipid Panel    Component Value Date/Time   CHOL 121 07/07/2007 2028   TRIG 140 07/07/2007 2028   HDL 31 (L) 07/07/2007 2028   CHOLHDL 3.9 Ratio 07/07/2007 2028   VLDL 28 07/07/2007 2028   LDLCALC 62 07/07/2007 2028    Additional studies/ records that were reviewed today include:      Echo November 2018: - Left ventricle: The cavity size was normal. Wall thickness was   increased in a pattern of severe LVH. Systolic function was   mildly reduced. The  estimated ejection fraction was in the range   of 45% to 50%. Diffuse hypokinesis. Doppler parameters are   consistent with abnormal left ventricular relaxation (grade 1   diastolic dysfunction). The E/e&' ratio is between 8-15,   suggesting indeterminate LV filling pressure. - Left atrium: The atrium was normal in size. - Tricuspid valve: There was mild regurgitation. - Pulmonary arteries: PA peak pressure: 29 mm Hg (S).    Cardiac cath 09/06/16: Conclusion       Ost LAD lesion, 20 %stenosed.  Prox LAD lesion, 35 %stenosed.  Ost 1st Diag lesion, 40 %stenosed.  Ost 2nd Diag to 2nd Diag lesion, 20 %stenosed.   Normal to very minimal elevation of right heart pressures.   LVEDP 9 mm Hg   Mild nonobstructive CAD involving the LAD with 20% proximal stenosis, 30-40% stenosis in the region of the first diagonal takeoff with 40% stenosis in the diagonal-1 vessel, and 20% narrowing in the second diagonal vessel, with slow filling apically; normal left circumflex coronary artery and normal dominant RCA.   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   RECOMMENDATION: Guideline directed medical therapy for the patient's nonischemic cardiomyopathy         ASSESSMENT:    1. Typical atrial flutter (HCC)   2. Dilated cardiomyopathy (HCC)   3. Hyperlipidemia, unspecified hyperlipidemia type   4. History of stroke   5. Essential hypertension      PLAN:  In order of problems listed  above:  Atrial flutter status post DCCV 10/2016 now back in atrial flutter with slow ventricular rate at 49 bpm on low-dose Coreg 3.125 mg twice daily.  He has been off his Eliquis for approximately 3 months because the pharmacy would not fill it.  Discussed with Dr. Ladona Ridgel.  He recommends continuing low-dose Coreg unless he becomes dizzy or develops low blood pressure.  We have given him 2 weeks samples of Eliquis and refilled his prescriptions.  We discussed the importance of staying on this without stopping it.  Follow-up with Dr. [name] in the next 6 months.  Dilated cardiomyopathy ejection fraction 20% improved to a 45 to 50% on echo 11/2016, now back in atrial flutter.  No symptoms of CHF.  Discussed with patient and family what symptoms to look for and call us if he has any changes.  Hyperlipidemia on Zocor LDL at goal on lipid panel 11/2016  History of CVA resuming Eliquis.  Also on aspirin.    Medication Adjustments/Labs and Tests Ordered: Current medicines are reviewed at length with the patient today.  Concerns regarding medicines are outlined above.  Medication changes, Labs and Tests ordered today are listed in the Patient Instructions below. Patient Instructions  Medication Instructions:    START TAKING ELIQUIS 5 MG TWICE A DAY    If you need a refill on your cardiac medications before your next appointment, please call your pharmacy.  Labwork:  CMET AND CBC  TODAY    Testing/Procedures: NONE ORDERED  TODAY    Follow-Up Your physician wants you to follow-up in:  IN  6  MONTHS WITH DR  [name] You will receive a reminder letter in the mail two months in advance. If you don't receive a letter, please call our office to schedule the follow-up appointment.   :     Any Other Special Instructions Will Be Listed Below (If Applicable).  Elson Clan, PA-C  07/15/2017 2:44 PM    [facility] [address] [place], [place], [place]  [address] Phone: [phone]; Fax: [phone]

## 2017-07-15 NOTE — Patient Instructions (Addendum)
Medication Instructions:    START TAKING ELIQUIS 5 MG TWICE A DAY    If you need a refill on your cardiac medications before your next appointment, please call your pharmacy.  Labwork:  CMET AND CBC  TODAY    Testing/Procedures: NONE ORDERED  TODAY    Follow-Up Your physician wants you to follow-up in:  IN  6  MONTHS WITH DR  [name] You will receive a reminder letter in the mail two months in advance. If you don't receive a letter, please call our office to schedule the follow-up appointment.   :     Any Other Special Instructions Will Be Listed Below (If Applicable).

## 2017-07-16 LAB — COMPREHENSIVE METABOLIC PANEL
ALT: 26 IU/L (ref 0–44)
AST: 34 IU/L (ref 0–40)
Albumin/Globulin Ratio: 1.7 (ref 1.2–2.2)
Albumin: 4.5 g/dL (ref 3.6–4.8)
Alkaline Phosphatase: 78 IU/L (ref 39–117)
BUN / CREAT RATIO: 10 (ref 10–24)
BUN: 11 mg/dL (ref 8–27)
Bilirubin Total: 0.7 mg/dL (ref 0.0–1.2)
CALCIUM: 9.6 mg/dL (ref 8.6–10.2)
CO2: 23 mmol/L (ref 20–29)
CREATININE: 1.15 mg/dL (ref 0.76–1.27)
Chloride: 103 mmol/L (ref 96–106)
GFR, EST AFRICAN AMERICAN: 79 mL/min/{1.73_m2} (ref >59)
GFR, EST NON AFRICAN AMERICAN: 68 mL/min/{1.73_m2} (ref >59)
GLOBULIN, TOTAL: 2.6 g/dL (ref 1.5–4.5)
Glucose: 44 mg/dL — ABNORMAL LOW (ref 65–99)
Potassium: 4.2 mmol/L (ref 3.5–5.2)
SODIUM: 144 mmol/L (ref 134–144)
Total Protein: 7.1 g/dL (ref 6.0–8.5)

## 2017-07-16 LAB — CBC
Hematocrit: 50 % (ref 37.5–51.0)
Hemoglobin: 16.2 g/dL (ref 13.0–17.7)
MCH: 30 pg (ref 26.6–33.0)
MCHC: 32.4 g/dL (ref 31.5–35.7)
MCV: 93 fL (ref 79–97)
PLATELETS: 204 10*3/uL (ref 150–450)
RBC: 5.4 x10E6/uL (ref 4.14–5.80)
RDW: 15.4 % (ref 12.3–15.4)
WBC: 7.9 10*3/uL (ref 3.4–10.8)

## 2017-07-31 ENCOUNTER — Inpatient Hospital Stay (HOSPITAL_COMMUNITY)
Admission: EM | Admit: 2017-07-31 | Discharge: 2017-08-02 | DRG: 292 | Disposition: A | Discharge status: Home Health | Payer: Medicaid Other | Attending: Family Medicine | Admitting: Family Medicine

## 2017-07-31 ENCOUNTER — Other Ambulatory Visit: Payer: Self-pay

## 2017-07-31 ENCOUNTER — Emergency Department (HOSPITAL_COMMUNITY): Payer: Medicaid Other

## 2017-07-31 ENCOUNTER — Encounter (HOSPITAL_COMMUNITY): Payer: Self-pay

## 2017-07-31 ENCOUNTER — Telehealth: Payer: Self-pay | Admitting: Physician Assistant

## 2017-07-31 DIAGNOSIS — Z7901 Long term (current) use of anticoagulants: Secondary | ICD-10-CM

## 2017-07-31 DIAGNOSIS — E162 Hypoglycemia, unspecified: Secondary | ICD-10-CM

## 2017-07-31 DIAGNOSIS — I428 Other cardiomyopathies: Secondary | ICD-10-CM | POA: Diagnosis present

## 2017-07-31 DIAGNOSIS — I69320 Aphasia following cerebral infarction: Secondary | ICD-10-CM

## 2017-07-31 DIAGNOSIS — Z7984 Long term (current) use of oral hypoglycemic drugs: Secondary | ICD-10-CM

## 2017-07-31 DIAGNOSIS — Z8249 Family history of ischemic heart disease and other diseases of the circulatory system: Secondary | ICD-10-CM

## 2017-07-31 DIAGNOSIS — I4892 Unspecified atrial flutter: Secondary | ICD-10-CM | POA: Diagnosis present

## 2017-07-31 DIAGNOSIS — Z23 Encounter for immunization: Secondary | ICD-10-CM

## 2017-07-31 DIAGNOSIS — I481 Persistent atrial fibrillation: Secondary | ICD-10-CM | POA: Diagnosis present

## 2017-07-31 DIAGNOSIS — I10 Essential (primary) hypertension: Secondary | ICD-10-CM | POA: Diagnosis present

## 2017-07-31 DIAGNOSIS — R001 Bradycardia, unspecified: Secondary | ICD-10-CM

## 2017-07-31 DIAGNOSIS — E119 Type 2 diabetes mellitus without complications: Secondary | ICD-10-CM

## 2017-07-31 DIAGNOSIS — I4819 Other persistent atrial fibrillation: Secondary | ICD-10-CM

## 2017-07-31 DIAGNOSIS — I5023 Acute on chronic systolic (congestive) heart failure: Secondary | ICD-10-CM

## 2017-07-31 DIAGNOSIS — E11649 Type 2 diabetes mellitus with hypoglycemia without coma: Secondary | ICD-10-CM | POA: Diagnosis present

## 2017-07-31 DIAGNOSIS — I509 Heart failure, unspecified: Secondary | ICD-10-CM

## 2017-07-31 DIAGNOSIS — I472 Ventricular tachycardia: Secondary | ICD-10-CM | POA: Diagnosis present

## 2017-07-31 DIAGNOSIS — I69351 Hemiplegia and hemiparesis following cerebral infarction affecting right dominant side: Secondary | ICD-10-CM

## 2017-07-31 DIAGNOSIS — I5021 Acute systolic (congestive) heart failure: Secondary | ICD-10-CM | POA: Diagnosis present

## 2017-07-31 DIAGNOSIS — Z8673 Personal history of transient ischemic attack (TIA), and cerebral infarction without residual deficits: Secondary | ICD-10-CM

## 2017-07-31 DIAGNOSIS — I2510 Atherosclerotic heart disease of native coronary artery without angina pectoris: Secondary | ICD-10-CM | POA: Diagnosis present

## 2017-07-31 DIAGNOSIS — Z7982 Long term (current) use of aspirin: Secondary | ICD-10-CM

## 2017-07-31 DIAGNOSIS — Z833 Family history of diabetes mellitus: Secondary | ICD-10-CM

## 2017-07-31 DIAGNOSIS — I110 Hypertensive heart disease with heart failure: Principal | ICD-10-CM | POA: Diagnosis present

## 2017-07-31 HISTORY — DX: Other cardiomyopathies: I42.8

## 2017-07-31 LAB — BASIC METABOLIC PANEL
ANION GAP: 10 (ref 5–15)
BUN: 15 mg/dL (ref 8–23)
CALCIUM: 9.7 mg/dL (ref 8.9–10.3)
CO2: 24 mmol/L (ref 22–32)
CREATININE: 1.13 mg/dL (ref 0.61–1.24)
Chloride: 107 mmol/L (ref 98–111)
Glucose, Bld: 48 mg/dL — ABNORMAL LOW (ref 70–99)
Potassium: 4.6 mmol/L (ref 3.5–5.1)
SODIUM: 141 mmol/L (ref 135–145)

## 2017-07-31 LAB — CBC
HCT: 48.5 % (ref 39.0–52.0)
HEMOGLOBIN: 15.5 g/dL (ref 13.0–17.0)
MCH: 29.6 pg (ref 26.0–34.0)
MCHC: 32 g/dL (ref 30.0–36.0)
MCV: 92.6 fL (ref 78.0–100.0)
PLATELETS: 193 10*3/uL (ref 150–400)
RBC: 5.24 MIL/uL (ref 4.22–5.81)
RDW: 14.6 % (ref 11.5–15.5)
WBC: 7.5 10*3/uL (ref 4.0–10.5)

## 2017-07-31 LAB — CBG MONITORING, ED: Glucose-Capillary: 129 mg/dL — ABNORMAL HIGH (ref 70–99)

## 2017-07-31 LAB — I-STAT TROPONIN, ED: TROPONIN I, POC: 0.03 ng/mL (ref 0.00–0.08)

## 2017-07-31 NOTE — Telephone Encounter (Signed)
New Message   Pt c/o of Chest Pain: STAT if CP now or developed within 24 hours  1. Are you having CP right now? no  2. Are you experiencing any other symptoms (ex. SOB, nausea, vomiting, sweating)? Nausea and a little sob  3. How long have you been experiencing CP? For about a week  4. Is your CP continuous or coming and going? Coming and going  5. Have you taken Nitroglycerin? no ?

## 2017-07-31 NOTE — Telephone Encounter (Signed)
Thanks. [name] 

## 2017-07-31 NOTE — ED Triage Notes (Addendum)
Pt here with wife for shob and abd pain x 1 week. Denies n/v/d or cp. In atrial flutter, has hx of a-fib and flutter, had electrical cardioversion in October last year. VSS

## 2017-07-31 NOTE — ED Provider Notes (Signed)
[facility] EMERGENCY DEPARTMENT Provider Note   CSN: [id number] Arrival date & time: 07/31/17  1841     History   Chief Complaint Chief Complaint  Patient presents with  . Shortness of Breath    HPI [name] is a 62 y.o. male.  The history is provided by the patient and a relative.  Shortness of Breath   He has history of hypertension, diabetes, atrial flutter, nonischemic cardiomyopathy stroke with right hemiparesis and expressive a aphasia and comes in with approximately 10-day history of exertional dyspnea.  This does not seem to be getting worse.  He has not noted any edema.  He denies chest pain but does have some chest tightness.  He denies any paroxysmal nocturnal dyspnea.  There is been no nausea or vomiting.  He has not done anything to treat this.  Of note, he had come off of his apixaban for several months, but restarted it 2 weeks ago.  Past Medical History:  Diagnosis Date  . Atrial flutter (HCC)   . Diabetes mellitus   . Hypertension   . Stroke [facility])     Patient Active Problem List   Diagnosis Date Noted  . Essential hypertension 07/15/2017  . Type 2 diabetes mellitus without complication, without long-term current use of insulin (HCC) 09/09/2016  . Acute systolic (congestive) heart failure (HCC) 09/07/2016  . Atrial flutter (HCC) 09/04/2016  . Cardiomyopathy (HCC) 06/02/2008  . ABNORMAL EKG 05/02/2008  . DIABETES MELLITUS, TYPE II 04/28/2008  . HLD (hyperlipidemia) 04/28/2008  . MIGRAINE HEADACHE 04/28/2008  . History of stroke 04/28/2008    Past Surgical History:  Procedure Laterality Date  . CARDIOVERSION N/A 10/10/2016   Procedure: CARDIOVERSION;  Surgeon: Lewayne Bunting, MD;  Location: [facility] ENDOSCOPY;  Service: Cardiovascular;  Laterality: N/A;  . RIGHT/LEFT HEART CATH AND CORONARY ANGIOGRAPHY N/A 09/06/2016   Procedure: RIGHT/LEFT HEART CATH AND CORONARY ANGIOGRAPHY;  Surgeon: Lennette Bihari, MD;  Location: [facility] INVASIVE  CV LAB;  Service: Cardiovascular;  Laterality: N/A;  . TRACHEOSTOMY          Home Medications    Prior to Admission medications   Medication Sig Start Date End Date Taking? Authorizing Provider  acetaminophen (TYLENOL) 500 MG tablet Take 1,000 mg by mouth every 6 (six) hours as needed for pain.    [provider]  apixaban (ELIQUIS) 5 MG TABS tablet Take 1 tablet (5 mg total) by mouth 2 (two) times daily. 07/15/17   Dyann Kief, PA-C  aspirin EC 81 MG EC tablet Take 1 tablet (81 mg total) by mouth daily. 09/09/16   Leone Brand, NP  carvedilol (COREG) 3.125 MG tablet Take 1 tablet (3.125 mg total) by mouth 2 (two) times daily with a meal. 04/08/17   Kathleene Hazel, MD  ENTRESTO 49-51 MG TAKE 1 TABLET BY MOUTH TWICE DAILY 03/20/17   Dyann Kief, PA-C  furosemide (LASIX) 20 MG tablet Take 1 tablet (20 mg total) by mouth daily. 04/08/17   Kathleene Hazel, MD  glipiZIDE (GLUCOTROL) 10 MG tablet Take 1 tablet (10 mg total) by mouth daily with breakfast. 09/09/16   Leone Brand, NP  loratadine (CLARITIN) 10 MG tablet Take 1 tablet (10 mg total) by mouth daily. 09/09/16   Leone Brand, NP  metFORMIN (GLUCOPHAGE) 1000 MG tablet Take 1,000 mg by mouth 2 (two) times daily.    [provider]  Multiple Vitamin (MULTIVITAMIN WITH MINERALS) TABS Take 1 tablet by mouth daily.  [provider]  omega-3 acid ethyl esters (LOVAZA) 1 G capsule Take 1 g by mouth daily.    [provider]  simvastatin (ZOCOR) 40 MG tablet Take 40 mg by mouth daily.    [provider]    Family History Family History  Problem Relation Age of Onset  . Hypertension Mother   . Diabetes Mother   . Heart disease Father     Social History Social History   Tobacco Use  . Smoking status: Never Smoker  . Smokeless tobacco: Never Used  Substance Use Topics  . Alcohol use: No  . Drug use: No     Allergies   Patient has no known allergies.   Review  of Systems Review of Systems  Respiratory: Positive for shortness of breath.   All other systems reviewed and are negative.    Physical Exam Updated Vital Signs BP 109/85 (BP Location: Right Arm)   Pulse 68   Temp 98 F (36.7 C) (Oral)   Resp 14   Ht 6' (1.829 m)   Wt 75.8 kg (167 lb)   SpO2 100%   BMI 22.65 kg/m   Physical Exam  Nursing note and vitals reviewed.  62 year old male, resting comfortably and in no acute distress. Vital signs are significant for bradycardia. Oxygen saturation is 100%, which is normal. Head is normocephalic and atraumatic. PERRLA, EOMI. Oropharynx is clear. Neck is nontender and supple without adenopathy or JVD. Back is nontender and there is no CVA tenderness. Lungs are clear without rales, wheezes, or rhonchi. Chest is nontender. Heart has an irregular bradycardic rhythm without murmur. Abdomen is soft, flat, nontender without masses or hepatosplenomegaly and peristalsis is normoactive. Extremities have 1-2+ edema, full range of motion is present. Skin is warm and dry without rash. Neurologic: Awake and alert and oriented, expressive aphasia present, cranial nerves are intact.  Moderately severe right hemiparesis present.  ED Treatments / Results  Labs (all labs ordered are listed, but only abnormal results are displayed) Labs Reviewed  BASIC METABOLIC PANEL - Abnormal; Notable for the following components:      Result Value   Glucose, Bld 48 (*)    All other components within normal limits  CBG MONITORING, ED - Abnormal; Notable for the following components:   Glucose-Capillary 129 (*)    All other components within normal limits  CBC  BRAIN NATRIURETIC PEPTIDE  BASIC METABOLIC PANEL  CBC  MAGNESIUM  TSH  I-STAT TROPONIN, ED    EKG EKG Interpretation  Date/Time:  Thursday July 31 2017 18:47:38 EDT Ventricular Rate:  55 PR Interval:    QRS Duration: 78 QT Interval:  390 QTC Calculation: 373 R Axis:   64 Text  Interpretation:  Atrial fibrillation with slow ventricular response Indeterminate axis Cannot rule out Anterior infarct , age undetermined ST & T wave abnormality, consider inferior ischemia or digitalis effect Abnormal ECG When compared with ECG of 10/10/2016, HEART RATE has decreased Confirmed by Dione Booze ([id number]) on 07/31/2017 11:20:54 PM   Radiology Dg Chest 2 View  Result Date: 07/31/2017 CLINICAL DATA:  Short of breath EXAM: CHEST - 2 VIEW COMPARISON:  09/04/2016 FINDINGS: Cardiomegaly with vascular congestion. Mild interstitial opacity suspect for minimal edema. Subsegmental atelectasis at the bases. No large effusion. No pneumothorax. IMPRESSION: Cardiomegaly with vascular congestion and mild interstitial edema. Electronically Signed   By: Jasmine Pang M.D.   On: 07/31/2017 19:44    Procedures Procedures   Medications Ordered in ED Medications  furosemide (LASIX) injection 40 mg (has no administration in time range)  apixaban (ELIQUIS) tablet 5 mg (has no administration in time range)  aspirin EC tablet 81 mg (has no administration in time range)  sacubitril-valsartan (ENTRESTO) 49-51 mg per tablet (has no administration in time range)  simvastatin (ZOCOR) tablet 40 mg (has no administration in time range)  omega-3 acid ethyl esters (LOVAZA) capsule 1 g (has no administration in time range)  acetaminophen (TYLENOL) tablet 650 mg (has no administration in time range)    Or  acetaminophen (TYLENOL) suppository 650 mg (has no administration in time range)  ondansetron (ZOFRAN) tablet 4 mg (has no administration in time range)    Or  ondansetron (ZOFRAN) injection 4 mg (has no administration in time range)  insulin aspart (novoLOG) injection 0-9 Units (has no administration in time range)  furosemide (LASIX) injection 40 mg (has no administration in time range)     Initial Impression / Assessment and Plan / ED Course  I have reviewed the triage vital signs and the nursing  notes.  Pertinent labs & imaging results that were available during my care of the patient were reviewed by me and considered in my medical decision making (see chart for details).  CHF exacerbation.  Also, marked bradycardia noted on ECG and heart monitor.  He is on a beta-blocker as the only rate control agent.  This will need to be discontinued.  Chest x-ray is consistent with CHF exacerbation.  BNP has been ordered and is pending.  Glucose was noted to be 48 and he has been given something to eat, and glucose has come up to normal.  Remainder of screening labs are unremarkable.  Old records are reviewed confirming long-standing atrial flutter, nonischemic cardiomyopathy with last ejection fraction 45-50% in November 2018.  At this point, I am concerned that his bradycardia may be contributing to his symptoms.  He will need to be admitted for diuresis and cardiac monitoring.  Case is discussed with Dr. Toniann Fail of [facility], who agrees to admit the patient.  Case also discussed with Dr. Santiago Glad of cardiology service who agrees to see the patient in consultation.  Final Clinical Impressions(s) / ED Diagnoses   Final diagnoses:  Acute on chronic systolic congestive heart failure (HCC)  Atrial flutter, unspecified type (HCC)  Bradycardia  Hypoglycemia    ED Discharge Orders    None       Dione Booze, MD 08/01/17 0116

## 2017-07-31 NOTE — Telephone Encounter (Signed)
Spoke to patient's wife in regards to patient's abdominal discomfort.  He denies nausea/vomiting, but is slightly SOB, without CP.   The patient was about to eat something to see if this relieves the discomfort.  I instructed her to observe this symptom over the next couple of days and to keep [name] updated if they persist.  They may need to refer to PCP.  She verbalized understanding and thanked [name] for the call.

## 2017-08-01 ENCOUNTER — Other Ambulatory Visit: Payer: Self-pay

## 2017-08-01 ENCOUNTER — Encounter (HOSPITAL_COMMUNITY): Payer: Self-pay | Admitting: Internal Medicine

## 2017-08-01 DIAGNOSIS — I69320 Aphasia following cerebral infarction: Secondary | ICD-10-CM | POA: Diagnosis not present

## 2017-08-01 DIAGNOSIS — Z7984 Long term (current) use of oral hypoglycemic drugs: Secondary | ICD-10-CM | POA: Diagnosis not present

## 2017-08-01 DIAGNOSIS — Z7982 Long term (current) use of aspirin: Secondary | ICD-10-CM | POA: Diagnosis not present

## 2017-08-01 DIAGNOSIS — Z833 Family history of diabetes mellitus: Secondary | ICD-10-CM | POA: Diagnosis not present

## 2017-08-01 DIAGNOSIS — I472 Ventricular tachycardia: Secondary | ICD-10-CM | POA: Diagnosis present

## 2017-08-01 DIAGNOSIS — I428 Other cardiomyopathies: Secondary | ICD-10-CM | POA: Diagnosis present

## 2017-08-01 DIAGNOSIS — Z7901 Long term (current) use of anticoagulants: Secondary | ICD-10-CM | POA: Diagnosis not present

## 2017-08-01 DIAGNOSIS — I69351 Hemiplegia and hemiparesis following cerebral infarction affecting right dominant side: Secondary | ICD-10-CM | POA: Diagnosis not present

## 2017-08-01 DIAGNOSIS — I481 Persistent atrial fibrillation: Secondary | ICD-10-CM

## 2017-08-01 DIAGNOSIS — I5021 Acute systolic (congestive) heart failure: Secondary | ICD-10-CM | POA: Diagnosis present

## 2017-08-01 DIAGNOSIS — I509 Heart failure, unspecified: Secondary | ICD-10-CM | POA: Diagnosis not present

## 2017-08-01 DIAGNOSIS — I5023 Acute on chronic systolic (congestive) heart failure: Secondary | ICD-10-CM | POA: Diagnosis not present

## 2017-08-01 DIAGNOSIS — I4892 Unspecified atrial flutter: Secondary | ICD-10-CM | POA: Diagnosis not present

## 2017-08-01 DIAGNOSIS — Z8249 Family history of ischemic heart disease and other diseases of the circulatory system: Secondary | ICD-10-CM | POA: Diagnosis not present

## 2017-08-01 DIAGNOSIS — E11649 Type 2 diabetes mellitus with hypoglycemia without coma: Secondary | ICD-10-CM | POA: Diagnosis present

## 2017-08-01 DIAGNOSIS — I10 Essential (primary) hypertension: Secondary | ICD-10-CM | POA: Diagnosis not present

## 2017-08-01 DIAGNOSIS — I110 Hypertensive heart disease with heart failure: Secondary | ICD-10-CM | POA: Diagnosis not present

## 2017-08-01 DIAGNOSIS — Z8673 Personal history of transient ischemic attack (TIA), and cerebral infarction without residual deficits: Secondary | ICD-10-CM | POA: Diagnosis not present

## 2017-08-01 DIAGNOSIS — E119 Type 2 diabetes mellitus without complications: Secondary | ICD-10-CM

## 2017-08-01 DIAGNOSIS — Z23 Encounter for immunization: Secondary | ICD-10-CM | POA: Diagnosis not present

## 2017-08-01 DIAGNOSIS — I2510 Atherosclerotic heart disease of native coronary artery without angina pectoris: Secondary | ICD-10-CM | POA: Diagnosis present

## 2017-08-01 DIAGNOSIS — I4819 Other persistent atrial fibrillation: Secondary | ICD-10-CM

## 2017-08-01 LAB — CBC
HCT: 47.4 % (ref 39.0–52.0)
HEMOGLOBIN: 15.6 g/dL (ref 13.0–17.0)
MCH: 29.8 pg (ref 26.0–34.0)
MCHC: 32.9 g/dL (ref 30.0–36.0)
MCV: 90.6 fL (ref 78.0–100.0)
PLATELETS: 190 10*3/uL (ref 150–400)
RBC: 5.23 MIL/uL (ref 4.22–5.81)
RDW: 14.2 % (ref 11.5–15.5)
WBC: 7.8 10*3/uL (ref 4.0–10.5)

## 2017-08-01 LAB — HEPATIC FUNCTION PANEL
ALBUMIN: 3.9 g/dL (ref 3.5–5.0)
ALT: 23 U/L (ref 0–44)
AST: 34 U/L (ref 15–41)
Alkaline Phosphatase: 64 U/L (ref 38–126)
Bilirubin, Direct: 0.2 mg/dL (ref 0.0–0.2)
Indirect Bilirubin: 0.8 mg/dL (ref 0.3–0.9)
Total Bilirubin: 1 mg/dL (ref 0.3–1.2)
Total Protein: 7 g/dL (ref 6.5–8.1)

## 2017-08-01 LAB — GLUCOSE, CAPILLARY
GLUCOSE-CAPILLARY: 121 mg/dL — AB (ref 70–99)
GLUCOSE-CAPILLARY: 137 mg/dL — AB (ref 70–99)
GLUCOSE-CAPILLARY: 179 mg/dL — AB (ref 70–99)
Glucose-Capillary: 112 mg/dL — ABNORMAL HIGH (ref 70–99)

## 2017-08-01 LAB — MAGNESIUM: MAGNESIUM: 2.1 mg/dL (ref 1.7–2.4)

## 2017-08-01 LAB — BASIC METABOLIC PANEL
ANION GAP: 12 (ref 5–15)
BUN: 12 mg/dL (ref 8–23)
CALCIUM: 9.5 mg/dL (ref 8.9–10.3)
CO2: 24 mmol/L (ref 22–32)
CREATININE: 0.95 mg/dL (ref 0.61–1.24)
Chloride: 106 mmol/L (ref 98–111)
Glucose, Bld: 108 mg/dL — ABNORMAL HIGH (ref 70–99)
Potassium: 3.7 mmol/L (ref 3.5–5.1)
SODIUM: 142 mmol/L (ref 135–145)

## 2017-08-01 LAB — TSH: TSH: 3.811 u[IU]/mL (ref 0.350–4.500)

## 2017-08-01 LAB — BRAIN NATRIURETIC PEPTIDE: B NATRIURETIC PEPTIDE 5: 800.3 pg/mL — AB (ref 0.0–100.0)

## 2017-08-01 LAB — MRSA PCR SCREENING: MRSA by PCR: NEGATIVE

## 2017-08-01 LAB — LIPASE, BLOOD: Lipase: 48 U/L (ref 11–51)

## 2017-08-01 MED ORDER — ASPIRIN EC 81 MG PO TBEC
81.0000 mg | DELAYED_RELEASE_TABLET | Freq: Every day | ORAL | Status: DC
  Administered 2017-08-01 – 2017-08-02 (×2): 81 mg via ORAL
  Filled 2017-08-01 (×2): qty 1

## 2017-08-01 MED ORDER — CARVEDILOL 3.125 MG PO TABS
3.1250 mg | ORAL_TABLET | Freq: Two times a day (BID) | ORAL | Status: DC
  Administered 2017-08-01 – 2017-08-02 (×2): 3.125 mg via ORAL
  Filled 2017-08-01 (×2): qty 1

## 2017-08-01 MED ORDER — FUROSEMIDE 10 MG/ML IJ SOLN
40.0000 mg | Freq: Two times a day (BID) | INTRAMUSCULAR | Status: DC
  Administered 2017-08-01 (×2): 40 mg via INTRAVENOUS
  Filled 2017-08-01 (×2): qty 4

## 2017-08-01 MED ORDER — ACETAMINOPHEN 650 MG RE SUPP: 650.0000 mg | Freq: Four times a day (QID) | RECTAL | Status: DC | PRN

## 2017-08-01 MED ORDER — PNEUMOCOCCAL VAC POLYVALENT 25 MCG/0.5ML IJ INJ
0.5000 mL | INJECTION | INTRAMUSCULAR | Status: AC
  Administered 2017-08-02: 0.5 mL via INTRAMUSCULAR
  Filled 2017-08-01: qty 0.5

## 2017-08-01 MED ORDER — ONDANSETRON HCL 4 MG/2ML IJ SOLN: 4.0000 mg | Freq: Four times a day (QID) | INTRAMUSCULAR | Status: DC | PRN

## 2017-08-01 MED ORDER — POTASSIUM CHLORIDE CRYS ER 20 MEQ PO TBCR
40.0000 meq | EXTENDED_RELEASE_TABLET | Freq: Once | ORAL | Status: AC
  Administered 2017-08-01: 40 meq via ORAL
  Filled 2017-08-01: qty 2

## 2017-08-01 MED ORDER — ACETAMINOPHEN 325 MG PO TABS: 650.0000 mg | ORAL_TABLET | Freq: Four times a day (QID) | ORAL | Status: DC | PRN

## 2017-08-01 MED ORDER — FUROSEMIDE 10 MG/ML IJ SOLN
40.0000 mg | Freq: Once | INTRAMUSCULAR | Status: AC
  Administered 2017-08-01: 40 mg via INTRAVENOUS
  Filled 2017-08-01: qty 4

## 2017-08-01 MED ORDER — SACUBITRIL-VALSARTAN 49-51 MG PO TABS
1.0000 | ORAL_TABLET | Freq: Two times a day (BID) | ORAL | Status: DC
  Administered 2017-08-01 – 2017-08-02 (×3): 1 via ORAL
  Filled 2017-08-01 (×3): qty 1

## 2017-08-01 MED ORDER — INSULIN ASPART 100 UNIT/ML ~~LOC~~ SOLN
0.0000 [IU] | Freq: Three times a day (TID) | SUBCUTANEOUS | Status: DC
  Administered 2017-08-01 – 2017-08-02 (×3): 1 [IU] via SUBCUTANEOUS

## 2017-08-01 MED ORDER — APIXABAN 5 MG PO TABS
5.0000 mg | ORAL_TABLET | Freq: Two times a day (BID) | ORAL | Status: DC
  Administered 2017-08-01 – 2017-08-02 (×3): 5 mg via ORAL
  Filled 2017-08-01 (×3): qty 1

## 2017-08-01 MED ORDER — ONDANSETRON HCL 4 MG PO TABS: 4.0000 mg | ORAL_TABLET | Freq: Four times a day (QID) | ORAL | Status: DC | PRN

## 2017-08-01 MED ORDER — OMEGA-3-ACID ETHYL ESTERS 1 G PO CAPS
1.0000 g | ORAL_CAPSULE | Freq: Every day | ORAL | Status: DC
  Administered 2017-08-01 – 2017-08-02 (×2): 1 g via ORAL
  Filled 2017-08-01 (×2): qty 1

## 2017-08-01 MED ORDER — SIMVASTATIN 40 MG PO TABS
40.0000 mg | ORAL_TABLET | Freq: Every day | ORAL | Status: DC
  Administered 2017-08-01: 40 mg via ORAL
  Filled 2017-08-01: qty 1

## 2017-08-01 NOTE — H&P (Addendum)
History and Physical    [name] YOK:[id number] DOB: [birth year] DOA: 07/31/2017  PCP: Mirna Mires, MD  Patient coming from: Home.  Chief Complaint: Shortness of breath.  History obtained from patient's niece at the bedside.  Patient has some difficulty speaking from previous stroke.  HPI: [name] is a 62 y.o. male with history of stroke with right-sided hemiplegia and expressive aphasia, atrial flutter fibrillation on Eliquis and Coreg, chronic systolic heart failure nonischemic last EF measured in November 2018 was 45 to 50%, diabetes mellitus type 2, hypertension presents to the ER with complaints of increasing shortness of breath over the last 1 week with some abdominal bloating sensation.  Denies any nausea vomiting denies any productive cough fever or chills.  Has noted increasing swelling in the both lower extremities.  ED Course: In the ER chest x-ray was showing some mild congestion and BNP was around 800.  Patient's EKG shows atrial flutter with slow ventricle response.  Patient's recent cardiology clinic appointment patient also was noticed to have low heart rate.  Patient on Coreg.  On-call cardiology was consulted.  Patient admitted for further work-up of CHF exacerbation and bradycardia.   Review of Systems: As per HPI, rest all negative.   Past Medical History:  Diagnosis Date  . Atrial flutter (HCC)   . Diabetes mellitus   . Hypertension   . Stroke [facility])     Past Surgical History:  Procedure Laterality Date  . CARDIOVERSION N/A 10/10/2016   Procedure: CARDIOVERSION;  Surgeon: Lewayne Bunting, MD;  Location: [facility] ENDOSCOPY;  Service: Cardiovascular;  Laterality: N/A;  . RIGHT/LEFT HEART CATH AND CORONARY ANGIOGRAPHY N/A 09/06/2016   Procedure: RIGHT/LEFT HEART CATH AND CORONARY ANGIOGRAPHY;  Surgeon: Lennette Bihari, MD;  Location: [facility] INVASIVE CV LAB;  Service: Cardiovascular;  Laterality: N/A;  . TRACHEOSTOMY       reports that he has never smoked.  He has never used smokeless tobacco. He reports that he does not drink alcohol or use drugs.  No Known Allergies  Family History  Problem Relation Age of Onset  . Hypertension Mother   . Diabetes Mother   . Heart disease Father     Prior to Admission medications   Medication Sig Start Date End Date Taking? Authorizing Provider  acetaminophen (TYLENOL) 500 MG tablet Take 1,000 mg by mouth every 6 (six) hours as needed for pain.    [provider]  apixaban (ELIQUIS) 5 MG TABS tablet Take 1 tablet (5 mg total) by mouth 2 (two) times daily. 07/15/17   Dyann Kief, PA-C  aspirin EC 81 MG EC tablet Take 1 tablet (81 mg total) by mouth daily. 09/09/16   Leone Brand, NP  carvedilol (COREG) 3.125 MG tablet Take 1 tablet (3.125 mg total) by mouth 2 (two) times daily with a meal. 04/08/17   Kathleene Hazel, MD  ENTRESTO 49-51 MG TAKE 1 TABLET BY MOUTH TWICE DAILY 03/20/17   Dyann Kief, PA-C  furosemide (LASIX) 20 MG tablet Take 1 tablet (20 mg total) by mouth daily. 04/08/17   Kathleene Hazel, MD  glipiZIDE (GLUCOTROL) 10 MG tablet Take 1 tablet (10 mg total) by mouth daily with breakfast. 09/09/16   Leone Brand, NP  loratadine (CLARITIN) 10 MG tablet Take 1 tablet (10 mg total) by mouth daily. 09/09/16   Leone Brand, NP  metFORMIN (GLUCOPHAGE) 1000 MG tablet Take 1,000 mg by mouth 2 (two) times daily.    [provider]  Multiple Vitamin (MULTIVITAMIN WITH MINERALS) TABS Take 1 tablet by mouth daily.    [provider]  omega-3 acid ethyl esters (LOVAZA) 1 G capsule Take 1 g by mouth daily.    [provider]  simvastatin (ZOCOR) 40 MG tablet Take 40 mg by mouth daily.    [provider]    Physical Exam: Vitals:   07/31/17 2029 07/31/17 2141 07/31/17 2255 08/01/17 0017  BP: 108/62 107/65 109/85 116/85  Pulse: (!) 58 (!) 52 68 60  Resp: 18 18 14  (!) 21  Temp:      TempSrc:      SpO2: 99% 100% 100% 100%  Weight:        Height:          Constitutional: Moderately built and nourished. Vitals:   07/31/17 2029 07/31/17 2141 07/31/17 2255 08/01/17 0017  BP: 108/62 107/65 109/85 116/85  Pulse: (!) 58 (!) 52 68 60  Resp: 18 18 14  (!) 21  Temp:      TempSrc:      SpO2: 99% 100% 100% 100%  Weight:      Height:       Eyes: Anicteric no pallor. ENMT: No discharge from the ears eyes nose or mouth. Neck: No mass felt.  No neck rigidity.  No JVD appreciated. Respiratory: No rhonchi or crepitations. Cardiovascular: S1-S2 heard no murmurs appreciated. Abdomen: Soft nontender bowel sounds present.  Mildly distended.  No guarding or rigidity. Musculoskeletal: Bilateral lower extremity edema present. Skin: No rash. Neurologic: Alert awake oriented to time place and person has difficulty to express.  From previous stroke.  Right-sided weakness.  Pupils equal and reacting to light. Psychiatric: Appears normal.   Labs on Admission: I have personally reviewed following labs and imaging studies  CBC: Recent Labs  Lab 07/31/17 1900  WBC 7.5  HGB 15.5  HCT 48.5  MCV 92.6  PLT 193   Basic Metabolic Panel: Recent Labs  Lab 07/31/17 1900  NA 141  K 4.6  CL 107  CO2 24  GLUCOSE 48*  BUN 15  CREATININE 1.13  CALCIUM 9.7   GFR: Estimated Creatinine Clearance: 73.6 mL/min (by C-G formula based on SCr of 1.13 mg/dL). Liver Function Tests: No results for input(s): AST, ALT, ALKPHOS, BILITOT, PROT, ALBUMIN in the last 168 hours. No results for input(s): LIPASE, AMYLASE in the last 168 hours. No results for input(s): AMMONIA in the last 168 hours. Coagulation Profile: No results for input(s): INR, PROTIME in the last 168 hours. Cardiac Enzymes: No results for input(s): CKTOTAL, CKMB, CKMBINDEX, TROPONINI in the last 168 hours. BNP (last 3 results) No results for input(s): PROBNP in the last 8760 hours. HbA1C: No results for input(s): HGBA1C in the last 72 hours. CBG: Recent Labs  Lab  07/31/17 2022  GLUCAP 129*   Lipid Profile: No results for input(s): CHOL, HDL, LDLCALC, TRIG, CHOLHDL, LDLDIRECT in the last 72 hours. Thyroid Function Tests: No results for input(s): TSH, T4TOTAL, FREET4, T3FREE, THYROIDAB in the last 72 hours. Anemia Panel: No results for input(s): VITAMINB12, FOLATE, FERRITIN, TIBC, IRON, RETICCTPCT in the last 72 hours. Urine analysis:    Component Value Date/Time   LABSPEC <=1.005 11/17/2008 1539   PHURINE 6.0 11/17/2008 1539   GLUCOSEU NEGATIVE 11/17/2008 1539   HGBUR NEGATIVE 11/17/2008 1539   BILIRUBINUR NEGATIVE 11/17/2008 1539   KETONESUR NEGATIVE 11/17/2008 1539   PROTEINUR NEGATIVE 11/17/2008 1539   UROBILINOGEN 1.0 11/17/2008 1539   NITRITE NEGATIVE 11/17/2008 1539   LEUKOCYTESUR  11/17/2008 1539    NEGATIVE Biochemical Testing Only. Please order routine urinalysis from main lab if confirmatory testing is needed.   Sepsis Labs: @LABRCNTIP (procalcitonin:4,lacticidven:4) )No results found for this or any previous visit (from the past 240 hour(s)).   Radiological Exams on Admission: Dg Chest 2 View  Result Date: 07/31/2017 CLINICAL DATA:  Short of breath EXAM: CHEST - 2 VIEW COMPARISON:  09/04/2016 FINDINGS: Cardiomegaly with vascular congestion. Mild interstitial opacity suspect for minimal edema. Subsegmental atelectasis at the bases. No large effusion. No pneumothorax. IMPRESSION: Cardiomegaly with vascular congestion and mild interstitial edema. Electronically Signed   By: Jasmine Pang M.D.   On: 07/31/2017 19:44    EKG: Independently reviewed.  Atrial flutter with slow response.  Assessment/Plan Principal Problem:   Acute systolic CHF (congestive heart failure) (HCC) Active Problems:   History of stroke   Atrial flutter (HCC)   Type 2 diabetes mellitus without complication, without long-term current use of insulin (HCC)   Essential hypertension   Acute CHF (congestive heart failure) (HCC)    1. Acute on chronic  systolic heart failure last EF measured was in November 27 1843 to 50%.  Patient received 40 mg of IV Lasix in the ER and I have placed patient on 40 mill grams IV every 12.  Closely follow intake output metabolic panel we will cycle cardiac markers.  Patient is on Entresto. 2. Atrial flutter with slow response.  Will hold Coreg.  Check TSH.  Cardiology consult recommendations awaited. 3. Abdominal bloating discomfort.  Abdomen appears benign on exam.  Closely observe.  Denies any nausea vomiting or diarrhea.  Will check LFTs and lipase. 4. Diabetes mellitus type 2 we will keep patient on sliding scale while inpatient.  Hold oral hypoglycemics. 5. History of stroke with right-sided hemiplegia on Eliquis. 6. Hypertension on Entresto and Coreg.:  On hold due to bradycardia.   DVT prophylaxis: Apixaban. Code Status: Full code. Family Communication: Patient's niece. Disposition Plan: Home. Consults called: Cardiology. Admission status: Inpatient.   Eduard Clos MD [facility] Pager [phone].  If 7PM-7AM, please contact night-coverage [email or web address] Password [facility]  08/01/2017, 12:59 AM

## 2017-08-01 NOTE — Discharge Instructions (Signed)

## 2017-08-01 NOTE — Progress Notes (Signed)
Patient is a 62 year old male with past medical history of CHF, atrial flutter, stroke with residual right sided hemiparesis and expressive aphasia, diabetes type 2, hypertension who presents to the emergency department with complaints of exertional dyspnea and chest tightness.  Patient was also noted to be bradycardic on presentation.  He was on beta-blockers from home.  Chest x-ray done in the emergency department was suggestive of CHF exacerbation.  Echocardiogram done in November 2018 was showing ejection fraction of 45 to 50%.  Patient was admitted for the management of CHF exacerbation and bradycardia. He has been started on IV diuresis.  Beta-blocker has  been held.  Cardiology consulted and is following.  Patient seen and examined the bedside this morning.  Currently he looks comfortable.  Denies any shortness of breath or chest pain.  Patient is a very poor historian. He is hemodynamically stable. We will continue to monitor the patient. Patient seen by Dr. Toniann Fail this morning.

## 2017-08-01 NOTE — Care Management Note (Signed)
Case Management Note  Patient Details  Name: Amey Olden MRN: [id number] Date of Birth: 02/03/1955  Subjective/Objective:         Spoke w patient and daughter at bedside. Patient lives at home with his 62 year old mother. Per daughter they "take care of each other." He denies barriers to obtaining medications, transportation, or seeing MD. He does however state that since his stroke he has difficulty stepping up on scale and maintaining balance. I feel that in hos acute post discharge phase he would benefit from REDS vest. Requested HH RN order to state "REDS Vest RN [phone]" as specified by [facility] from Dr Renford Dills. Will continue to follow.            Action/Plan:   Expected Discharge Date:                  Expected Discharge Plan:  Home w Home Health Services  In-House Referral:     Discharge planning Services  CM Consult  Post Acute Care Choice:    Choice offered to:     DME Arranged:    DME Agency:     HH Arranged:    HH Agency:     Status of Service:  In process, will continue to follow  If discussed at Long Length of Stay Meetings, dates discussed:    Additional Comments:  Lawerance Sabal, RN 08/01/2017, 2:41 PM

## 2017-08-01 NOTE — Consult Note (Addendum)
Cardiology Consultation:   Patient ID: [name]; [id number]; [birth year]   Admit date: 07/31/2017 Date of Consult: 08/01/2017  Primary Care Provider: Mirna Mires, MD Primary Cardiologist: Dr [name] Primary Electrophysiologist:  n/a   Patient Profile:   [name] is a 62 y.o. male with a hx of CVA in 2010, expressive a aphasia, chronic right-sided weakness, atrial flutter on Eliquis status post DCCV 10/10/2016, hypertension, diabetes mellitus.  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  LVEF improved to 45 to 50% on echo 11/2016.  He was admitted 07/26 early am w/ CHF exacerbation and bradycardia, cards asked to see for the evaluation of bradycardia at the request of Dr Toniann Fail.  History of Present Illness:   [name] is able to answer simple questions but not able to discuss issues.  His neice came into the room towards the end and was able to provide some information.  He was seen in the office on 07/15/2017.  His only real complaint was abdominal discomfort when he takes a deep breath.  He was back in atrial flutter at that time with a heart rate of 49, on Coreg 3.125 mg twice daily.  Dr. Ladona Ridgel reviewed the situation and recommended continuing low-dose Coreg unless he becomes dizzy or develops low blood pressure.  He had been off his Eliquis for several months and the prescription was refilled.  He was having no CHF symptoms.  Mr. Hannold had been complaining of chest pain according to his niece for about a week.  According to a phone note from 7/25, he was complaining of chest pain, but a return phone call from the staff described abdominal pain.  This had been going on for about a week.  He was having a little shortness of breath.  They came to the ER on the evening of 7/25 for shortness of breath and abdominal pain for a week.  Chest x-ray showed mild edema.  BNP was elevated at  800.  He got a dose of IV Lasix 40 mg at 1:30 AM and at 9:30 AM.  Mr. Cruces states he is breathing much better.  He is currently denying any chest pain.  He denies shortness of breath.  Heart rate is low as 35 was noted when he was in the ER,, he is currently in coarse atrial fibrillation.  His heart rate is currently in the 50s and he is asymptomatic with this.  It is unclear if he was asleep when his heart rate was at its lowest.  He did not require atropine.   Past Medical History:  Diagnosis Date  . Atrial flutter (HCC)   . Diabetes mellitus   . Hypertension   . 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 Stroke [facility])     Past Surgical History:  Procedure Laterality Date  . CARDIOVERSION N/A 10/10/2016   Procedure: CARDIOVERSION;  Surgeon: Lewayne Bunting, MD;  Location[facility] ENDOSCOPY;  Service: Cardiovascular;  Laterality: N/A;  . RIGHT/LEFT HEART CATH AND CORONARY ANGIOGRAPHY N/A 09/06/2016   Procedure: RIGHT/LEFT HEART CATH AND CORONARY ANGIOGRAPHY;  Surgeon: Lennette Bihari, MD;  Location: [facility] INVASIVE CV LAB;  Service: Cardiovascular;  Laterality: N/A;  . TRACHEOSTOMY       Prior to Admission medications   Medication Sig Start Date End Date Taking? Authorizing Provider  acetaminophen (TYLENOL) 500 MG tablet Take 1,000  mg by mouth every 6 (six) hours as needed for pain.   Yes [provider]  apixaban (ELIQUIS) 5 MG TABS tablet Take 1 tablet (5 mg total) by mouth 2 (two) times daily. 07/15/17  Yes Dyann Kief, PA-C  aspirin EC 81 MG EC tablet Take 1 tablet (81 mg total) by mouth daily. 09/09/16  Yes Leone Brand, NP  carvedilol (COREG) 3.125 MG tablet Take 1 tablet (3.125 mg total) by mouth 2 (two) times daily with a meal. 04/08/17  Yes Kathleene Hazel, MD  ENTRESTO 49-51 MG TAKE 1 TABLET BY MOUTH TWICE DAILY 03/20/17  Yes Dyann Kief, PA-C  furosemide (LASIX) 20  MG tablet Take 1 tablet (20 mg total) by mouth daily. 04/08/17  Yes Kathleene Hazel, MD  glipiZIDE (GLUCOTROL) 10 MG tablet Take 1 tablet (10 mg total) by mouth daily with breakfast. 09/09/16  Yes Leone Brand, NP  metFORMIN (GLUCOPHAGE) 1000 MG tablet Take 1,000 mg by mouth 2 (two) times daily.   Yes [provider]  Multiple Vitamin (MULTIVITAMIN WITH MINERALS) TABS Take 1 tablet by mouth daily.   Yes [provider]  omega-3 acid ethyl esters (LOVAZA) 1 G capsule Take 1 g by mouth daily.   Yes [provider]  simvastatin (ZOCOR) 40 MG tablet Take 40 mg by mouth daily.   Yes [provider]    Inpatient Medications: Scheduled Meds: . apixaban  5 mg Oral BID  . aspirin EC  81 mg Oral Daily  . furosemide  40 mg Intravenous Q12H  . insulin aspart  0-9 Units Subcutaneous TID WC  . omega-3 acid ethyl esters  1 g Oral Daily  . sacubitril-valsartan  1 tablet Oral BID  . simvastatin  40 mg Oral Daily   Continuous Infusions:  PRN Meds: acetaminophen **OR** acetaminophen, ondansetron **OR** ondansetron (ZOFRAN) IV  Allergies:   No Known Allergies  Social History:   Social History   Socioeconomic History  . Marital status: Single    Spouse name: Not on file  . Number of children: Not on file  . Years of education: Not on file  . Highest education level: Not on file  Occupational History  . Not on file  Social Needs  . Financial resource strain: Not on file  . Food insecurity:    Worry: Not on file    Inability: Not on file  . Transportation needs:    Medical: Not on file    Non-medical: Not on file  Tobacco Use  . Smoking status: Never Smoker  . Smokeless tobacco: Never Used  Substance and Sexual Activity  . Alcohol use: No  . Drug use: No  . Sexual activity: Not Currently  Lifestyle  . Physical activity:    Days per week: Not on file    Minutes per session: Not on file  . Stress: Not on file  Relationships  . Social  connections:    Talks on phone: Not on file    Gets together: Not on file    Attends religious service: Not on file    Active member of club or organization: Not on file    Attends meetings of clubs or organizations: Not on file    Relationship status: Not on file  . Intimate partner violence:    Fear of current or ex partner: Not on file    Emotionally abused: Not on file    Physically abused: Not on file    Forced sexual  activity: Not on file  Other Topics Concern  . Not on file  Social History Narrative  . Not on file    Family History:   Family History  Problem Relation Age of Onset  . Hypertension Mother   . Diabetes Mother   . Heart disease Father    Family Status:  Family Status  Relation Name Status  . Mother  Alive  . Father  Deceased    ROS:  Please see the history of present illness.  All other ROS reviewed and negative.     Physical Exam/Data:   Vitals:   08/01/17 0317 08/01/17 0400 08/01/17 0802 08/01/17 1127  BP: 101/74  123/84 100/76  Pulse: 66  85 (!) 54  Resp: (!) 24  (!) 24 20  Temp: (!) 97.5 F (36.4 C)  97.9 F (36.6 C) 97.8 F (36.6 C)  TempSrc: Oral  Oral Oral  SpO2: 98%  97% 100%  Weight:  161 lb 9.6 oz (73.3 kg)    Height:        Intake/Output Summary (Last 24 hours) at 08/01/2017 1218 Last data filed at 08/01/2017 1100 Gross per 24 hour  Intake 240 ml  Output 3100 ml  Net -2860 ml   Filed Weights   07/31/17 1855 08/01/17 0130 08/01/17 0400  Weight: 167 lb (75.8 kg) 163 lb 2.3 oz (74 kg) 161 lb 9.6 oz (73.3 kg)   Body mass index is 21.92 kg/m.  General:  Well nourished, well developed, in no acute distress HEENT: normal Lymph: no adenopathy Neck: JVD 8-9 cm Endocrine:  No thryomegaly Vascular: No carotid bruits; 4/4 extremity pulses 2+, without bruits  Cardiac:  normal S1, S2; irregular rate and rhythm, 1/6 murmur  Lungs: Bibasilar Rales, no wheezing, rhonchi Abd: soft, diffusely tender, no hepatomegaly; Ext: no  edema Musculoskeletal:  No deformities, BUE and BLE strength normal and equal Skin: warm and dry  Neuro:  CNs 2-12 intact, no focal abnormalities noted Psych:  Normal affect   EKG:  The EKG was personally reviewed and demonstrates: Coarse atrial fibrillation, heart rate 55, no acute ischemic changes Telemetry:  Telemetry was personally reviewed and demonstrates: Coarse atrial fibrillation, bradycardic at times. HR drops into the 30s at times, pauses 2.5-2.8 seconds, 1 pause 3.3 sec. Several episodes NSVT, longest one 7 bts.   Relevant CV Studies:  ECHO: 11/22/2016 - Left ventricle: The cavity size was normal. Wall thickness was   increased in a pattern of severe LVH. Systolic function was   mildly reduced. The estimated ejection fraction was in the range   of 45% to 50%. Diffuse hypokinesis. Doppler parameters are   consistent with abnormal left ventricular relaxation (grade 1   diastolic dysfunction). The E/e&' ratio is between 8-15,   suggesting indeterminate LV filling pressure. - Left atrium: The atrium was normal in size. - Tricuspid valve: There was mild regurgitation. - Pulmonary arteries: PA peak pressure: 29 mm Hg (S). Impressions: - Compared to a prior study in 08/2016, the LVEF has improved from   20-25% up to 45-50%.  CATH: 09/06/2016  Ost LAD lesion, 20 %stenosed.  Prox LAD lesion, 35 %stenosed.  Ost 1st Diag lesion, 40 %stenosed.  Ost 2nd Diag to 2nd Diag lesion, 20 %stenosed.   Normal to very minimal elevation of right heart pressures.  LVEDP 9 mm Hg  Mild nonobstructive CAD involving the LAD with 20% proximal stenosis, 30-40% stenosis in the region of the first diagonal takeoff with 40% stenosis in the diagonal-1 vessel,  and 20% narrowing in the second diagonal vessel, with slow filling apically; normal left circumflex coronary artery and normal dominant RCA.  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  RECOMMENDATION: Guideline  directed medical therapy for the patient's nonischemic cardiomyopathy  Laboratory Data:  Chemistry Recent Labs  Lab 07/31/17 1900 08/01/17 0325  NA 141 142  K 4.6 3.7  CL 107 106  CO2 24 24  GLUCOSE 48* 108*  BUN 15 12  CREATININE 1.13 0.95  CALCIUM 9.7 9.5  GFRNONAA >60 >60  GFRAA >60 >60  ANIONGAP 10 12    Lab Results  Component Value Date   ALT 26 07/15/2017   AST 34 07/15/2017   ALKPHOS 78 07/15/2017   BILITOT 0.7 07/15/2017   Hematology Recent Labs  Lab 07/31/17 1900 08/01/17 0325  WBC 7.5 7.8  RBC 5.24 5.23  HGB 15.5 15.6  HCT 48.5 47.4  MCV 92.6 90.6  MCH 29.6 29.8  MCHC 32.0 32.9  RDW 14.6 14.2  PLT 193 190   Cardiac EnzymesNo results for input(s): TROPONINI in the last 168 hours.  Recent Labs  Lab 07/31/17 1909  TROPIPOC 0.03    BNP Recent Labs  Lab 08/01/17 0325  BNP 800.3*    TSH:  Lab Results  Component Value Date   TSH 3.811 08/01/2017   Lipids: Lab Results  Component Value Date   CHOL 121 07/07/2007   HDL 31 (L) 07/07/2007   LDLCALC 62 07/07/2007   TRIG 140 07/07/2007   CHOLHDL 3.9 Ratio 07/07/2007   HgbA1c: Lab Results  Component Value Date   HGBA1C 6.6 (H) 09/05/2016   Magnesium:  Magnesium  Date Value Ref Range Status  08/01/2017 2.1 1.7 - 2.4 mg/dL Final    Comment:    Performed at [facility], [address] [place], [place] [address]     Radiology/Studies:  Dg Chest 2 View  Result Date: 07/31/2017 CLINICAL DATA:  Short of breath EXAM: CHEST - 2 VIEW COMPARISON:  09/04/2016 FINDINGS: Cardiomegaly with vascular congestion. Mild interstitial opacity suspect for minimal edema. Subsegmental atelectasis at the bases. No large effusion. No pneumothorax. IMPRESSION: Cardiomegaly with vascular congestion and mild interstitial edema. Electronically Signed   By: Jasmine Pang M.D.   On: 07/31/2017 19:44    Assessment and Plan:   1.  Persistent atrial fibrillation with slow ventricular response and NSVT: - His  heart rate has been low in the past.  At his last office visit, Dr. Ladona Ridgel recommended continuing low-dose Coreg unless he developed symptomatic bradycardia. - I am unable to determine from records that there were any symptoms associated with his low heart rates. -Unless there are multiple pauses longer than 3 seconds or he is symptomatic from the bradycardia, continue the carvedilol.  It may help keep his blood pressure under control and blunt high heart rate responses as well as help limit the NSVT -Give carvedilol now and give a dose at 11 PM.  Follow his heart rate overnight and decide in a.m. if he can tolerate it.  2.  Acute on chronic systolic CHF: - He has mild volume overload on exam, has improved quite a bit after 2 doses of IV Lasix. - Continue IV Lasix for now, following kidney function and electrolytes. -Hopefully, can convert to p.o. Lasix in the next 24-48 hours.  Otherwise, per IM Principal Problem:   Acute systolic CHF (congestive heart failure) (HCC) Active Problems:   History of stroke   Atrial flutter (HCC)  Type 2 diabetes mellitus without complication, without long-term current use of insulin (HCC)   Essential hypertension   Acute CHF (congestive heart failure) (HCC)   For questions or updates, please contact [facility] Please consult [email or web address] for contact info under Cardiology/STEMI.   Signed, Theodore Demark, PA-C  08/01/2017 12:18 PM   I have personally seen and examined this patient. I agree with the assessment and plan as outlined above.  Mr. Eberle is well known to me. He has had a CVA. Mild LV dysfunction. Mild CAD by cath August 2018. Admitted with volume overload/acute CHF. He described dyspnea and chest pressure on admission, resolved with diuresis. HR in the 35-60 bpm range, atrial fib on Coreg.  Labs reviewed by me and shows slow atrial fib.  My exam: General: Well developed, well nourished, NAD  HEENT: OP clear, mucus membranes moist   SKIN: warm, dry. No rashes. Neuro: No focal deficits  Musculoskeletal: Muscle strength 5/5 all ext  Psychiatric: Mood and affect normal  Neck: No JVD, no carotid bruits, no thyromegaly, no lymphadenopathy.  Lungs:Clear bilaterally, no wheezes, rhonci, crackles Cardiovascular: Irreg irreg. No murmurs, gallops or rubs. Abdomen:Soft. Bowel sounds present. Non-tender.  Extremities: No lower extremity edema. Pulses are 2 + in the bilateral DP/PT.  1. Acute systolic CHF: Volume status near normal on exam. One more dose of IV Lasix today.   2. Atrial fib with slow ventricular response: HR up to 70 bpm when he moves but in the 40-50s when in bed. He has no dizziness. Beta blocker important given his cardiomyopathy and also NSVT. I would try Coreg 3.125 mg today and follow HR. If he becomes symptomatic or rates remain in 30s, may have to d/c Coreg.   We will see him in the am.   Verne Carrow 08/01/2017 1:14 PM

## 2017-08-02 DIAGNOSIS — Z8673 Personal history of transient ischemic attack (TIA), and cerebral infarction without residual deficits: Secondary | ICD-10-CM

## 2017-08-02 DIAGNOSIS — I5021 Acute systolic (congestive) heart failure: Secondary | ICD-10-CM

## 2017-08-02 DIAGNOSIS — I4892 Unspecified atrial flutter: Secondary | ICD-10-CM

## 2017-08-02 DIAGNOSIS — I10 Essential (primary) hypertension: Secondary | ICD-10-CM

## 2017-08-02 LAB — BASIC METABOLIC PANEL
ANION GAP: 15 (ref 5–15)
BUN: 19 mg/dL (ref 8–23)
CALCIUM: 9.2 mg/dL (ref 8.9–10.3)
CO2: 24 mmol/L (ref 22–32)
Chloride: 102 mmol/L (ref 98–111)
Creatinine, Ser: 1.18 mg/dL (ref 0.61–1.24)
GLUCOSE: 124 mg/dL — AB (ref 70–99)
POTASSIUM: 4 mmol/L (ref 3.5–5.1)
SODIUM: 141 mmol/L (ref 135–145)

## 2017-08-02 LAB — GLUCOSE, CAPILLARY: Glucose-Capillary: 125 mg/dL — ABNORMAL HIGH (ref 70–99)

## 2017-08-02 MED ORDER — FUROSEMIDE 40 MG PO TABS: 40.0000 mg | ORAL_TABLET | Freq: Every day | ORAL | 1 refills | Status: DC

## 2017-08-02 MED ORDER — FUROSEMIDE 40 MG PO TABS
40.0000 mg | ORAL_TABLET | Freq: Every day | ORAL | Status: DC
  Administered 2017-08-02: 40 mg via ORAL
  Filled 2017-08-02: qty 1

## 2017-08-02 NOTE — Discharge Summary (Signed)
Physician Discharge Summary [facility]    Patient: [name]                   Admit date: 07/31/2017   DOB: Jul 18, 1955             Discharge date:08/02/2017/9:57 AM ZOX:[id number]                           PCP: Mirna Mires, MD Recommendations for Outpatient Follow-up:   1.  Please follow-up with your primary care physician within 1-2 weeks. 2.  increased dose of 40 mg daily. Cardiology Will need follow-up in 7-10 days TOC with APP or Dr. [name].  Discharge Condition: Stable  CODE STATUS:  Full code    Diet recommendation:  Cardiac healthy diet  ----------------------------------------------------------------------------------------------------------------------  Discharge Diagnoses:   Principal Problem:   Acute systolic CHF (congestive heart failure) (HCC) Active Problems:   History of stroke   Atrial flutter (HCC)   Type 2 diabetes mellitus without complication, without long-term current use of insulin (HCC)   Essential hypertension   Acute CHF (congestive heart failure) (HCC)   Acute on chronic systolic congestive heart failure (HCC)   Persistent atrial fibrillation (HCC)   History of present illness :  [name] is a 62 y.o. male with history of stroke with right-sided hemiplegia and expressive aphasia, atrial flutter fibrillation on Eliquis and Coreg, chronic systolic heart failure nonischemic last EF measured in November 2018 was 45 to 50%, diabetes mellitus type 2, hypertension presents to the ER with complaints of increasing shortness of breath over the last 1 week with some abdominal bloating sensation.  Denies any nausea vomiting denies any productive cough fever or chills.  Has noted increasing swelling in the both lower extremities.  ED Course: In the ER chest x-ray was showing some mild congestion and BNP was around 800.  Patient's EKG shows atrial flutter with slow ventricle response.  Patient's recent cardiology clinic appointment patient  also was noticed to have low heart rate.  Patient on Coreg.  On-call cardiology was consulted.  Patient admitted for further work-up of CHF exacerbation and bradycardia.   Hospital course / Brief Summary:  Was admitted for SOB, due to CHF, cardiology was consulted.  1. Acute on chronic systolic heart failure last EF measured was in November 27 1843 to 50%.  Patient received 40 mg of IV Lasix in the ER and was placed patient on 40 mill grams IV every 12.   Diuresed well3.7L negative overnight - now 5L negative. Creatinine trending up today. Plan to transition to po lasix. BNP yesterday was 800 - excellent response to diuretics. Weight down from 161 to 152 lbs.  Patient is on Entresto.  2. Atrial flutter with slow response. Card. Resumed Coreg.  TSH WNL at 3.8. HR today67 (57-67)   3. Abdominal bloating discomfort. Resolved, Lipase ans LFTs WNL  4. Diabetes mellitus type 2- resume Home meds  5. History of stroke with right-sided hemiplegia - cont.  Eliquis. 6. Hypertension - cont.  Entresto and Coreg. Per card.   Disposition ---- D/C Home    Consultations:  Cardiology    ----------------------------------------------------------------------------------------------------------------------  Discharge Instructions:   Discharge Instructions    Activity as tolerated - No restrictions   Complete by:  As directed    Diet - low sodium heart healthy   Complete by:  As directed    Discharge instructions   Complete by:  As directed  Cardiac, Low salt diet.,  Daily weight,   increased dose of 40 mg daily.  Cardiology need follow-up in 7-10 days TOC with APP or Dr. [name].   Increase activity slowly   Complete by:  As directed        Medication List    TAKE these medications   acetaminophen 500 MG tablet Commonly known as:  TYLENOL Take 1,000 mg by mouth every 6 (six) hours as needed for pain.   apixaban 5 MG Tabs tablet Commonly known as:  ELIQUIS Take 1 tablet (5 mg  total) by mouth 2 (two) times daily.   aspirin 81 MG EC tablet Take 1 tablet (81 mg total) by mouth daily.   carvedilol 3.125 MG tablet Commonly known as:  COREG Take 1 tablet (3.125 mg total) by mouth 2 (two) times daily with a meal.   ENTRESTO 49-51 MG Generic drug:  sacubitril-valsartan TAKE 1 TABLET BY MOUTH TWICE DAILY   furosemide 40 MG tablet Commonly known as:  LASIX Take 1 tablet (40 mg total) by mouth daily. Start taking on:  08/03/2017 What changed:    medication strength  how much to take   glipiZIDE 10 MG tablet Commonly known as:  GLUCOTROL Take 1 tablet (10 mg total) by mouth daily with breakfast.   metFORMIN 1000 MG tablet Commonly known as:  GLUCOPHAGE Take 1,000 mg by mouth 2 (two) times daily.   multivitamin with minerals Tabs tablet Take 1 tablet by mouth daily.   omega-3 acid ethyl esters 1 g capsule Commonly known as:  LOVAZA Take 1 g by mouth daily.   simvastatin 40 MG tablet Commonly known as:  ZOCOR Take 40 mg by mouth daily.       No Known Allergies    Procedures/Studies: Dg Chest 2 View  Result Date: 07/31/2017 CLINICAL DATA:  Short of breath EXAM: CHEST - 2 VIEW COMPARISON:  09/04/2016 FINDINGS: Cardiomegaly with vascular congestion. Mild interstitial opacity suspect for minimal edema. Subsegmental atelectasis at the bases. No large effusion. No pneumothorax. IMPRESSION: Cardiomegaly with vascular congestion and mild interstitial edema. Electronically Signed   By: Jasmine Pang M.D.   On: 07/31/2017 19:44      Subjective: Patient was seen and examined 08/02/2017, 9:57 AM Patient stable  Today. No acute distress.  No issues overnight Stable for discharge.  Discharge Exam:  Vitals:   08/01/17 2305 08/02/17 0335 08/02/17 0745 08/02/17 0914  BP: 104/72 107/78 107/74 107/82  Pulse: (!) 53 (!) 54 (!) 57 67  Resp: (!) 24 (!) 25 20   Temp: 97.6 F (36.4 C) 98 F (36.7 C) 98 F (36.7 C)   TempSrc: Oral Oral Oral   SpO2: 99%  100% 100%   Weight:  69 kg (152 lb 1.9 oz)    Height:        General: Pt lying comfortably in bed & appears in no obvious distress. Cardiovascular: S1 & S2 heard, RRR, S1/S2 +. No murmurs, rubs, gallops or clicks. No JVD or pedal edema. Respiratory: Clear to auscultation without wheezing, rhonchi or crackles. No increased work of breathing. Abdominal:  Non distended, non tender & soft. No organomegaly or masses appreciated. Normal bowel sounds heard. CNS: Alert and oriented. No focal deficits. Extremities: no edema, no cyanosis    The results of significant diagnostics from this hospitalization (including imaging, microbiology, ancillary and laboratory) are listed below for reference.     Microbiology: Recent Results (from the past 240 hour(s))  MRSA PCR Screening  Status: None   Collection Time: 08/01/17  3:50 AM  Result Value Ref Range Status   MRSA by PCR NEGATIVE NEGATIVE Final    Comment:        The GeneXpert MRSA Assay (FDA approved for NASAL specimens only), is one component of a comprehensive MRSA colonization surveillance program. It is not intended to diagnose MRSA infection nor to guide or monitor treatment for MRSA infections. Performed at [facility], [address], [place], [place] [address]      Labs: CBC: Recent Labs  Lab 07/31/17 1900 08/01/17 0325  WBC 7.5 7.8  HGB 15.5 15.6  HCT 48.5 47.4  MCV 92.6 90.6  PLT 193 190   Basic Metabolic Panel: Recent Labs  Lab 07/31/17 1900 08/01/17 0325 08/02/17 0245  NA 141 142 141  K 4.6 3.7 4.0  CL 107 106 102  CO2 24 24 24   GLUCOSE 48* 108* 124*  BUN 15 12 19   CREATININE 1.13 0.95 1.18  CALCIUM 9.7 9.5 9.2  MG  --  2.1  --    Liver Function Tests: Recent Labs  Lab 08/01/17 1615  AST 34  ALT 23  ALKPHOS 64  BILITOT 1.0  PROT 7.0  ALBUMIN 3.9   BNP (last 3 results) Recent Labs    09/04/16 2101 09/09/16 0208 08/01/17 0325  BNP 696.7* 167.3* 800.3*   Cardiac Enzymes: No  results for input(s): CKTOTAL, CKMB, CKMBINDEX, TROPONINI in the last 168 hours. CBG: Recent Labs  Lab 08/01/17 0809 08/01/17 1229 08/01/17 1617 08/01/17 2117 08/02/17 0810  GLUCAP 121* 112* 137* 179* 125*    Recent Labs    08/01/17 0325  TSH 3.811   Anemia work up No results for input(s): VITAMINB12, FOLATE, FERRITIN, TIBC, IRON, RETICCTPCT in the last 72 hours. Urinalysis    Component Value Date/Time   LABSPEC <=1.005 11/17/2008 1539   PHURINE 6.0 11/17/2008 1539   GLUCOSEU NEGATIVE 11/17/2008 1539   HGBUR NEGATIVE 11/17/2008 1539   BILIRUBINUR NEGATIVE 11/17/2008 1539   KETONESUR NEGATIVE 11/17/2008 1539   PROTEINUR NEGATIVE 11/17/2008 1539   UROBILINOGEN 1.0 11/17/2008 1539   NITRITE NEGATIVE 11/17/2008 1539   LEUKOCYTESUR  11/17/2008 1539    NEGATIVE Biochemical Testing Only. Please order routine urinalysis from main lab if confirmatory testing is needed.    Time coordinating discharge: Over 30 minutes  SIGNED: Kendell Bane, MD, FACP, [facility]. [facility],  Pager [phone]  If 7PM-7AM, please contact night-coverage [email or web address] 08/02/2017, 9:57 AM

## 2017-08-02 NOTE — Progress Notes (Signed)
DAILY PROGRESS NOTE   Patient Name: [name] Date of Encounter: 08/02/2017  Chief Complaint   Shortness of breath improved  Patient Profile   Ervin Hensley is a 62 y.o. male with a hx of CVA in 2010, expressive a aphasia, chronic right-sided weakness, atrial flutter on Eliquis status post DCCV 10/10/2016, hypertension, diabetes mellitus. 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 LVEF improved to 45 to 50% on echo 11/2016.  Subjective   Diuresed about 3.7L negative overnight - now 5L negative. Creatinine trending up today. Plan to transition to po lasix. BNP yesterday was 800 - excellent response to diuretics. Weight down from 161 to 152 lbs.  Objective   Vitals:   08/01/17 1932 08/01/17 2305 08/02/17 0335 08/02/17 0745  BP: 104/75 104/72 107/78 107/74  Pulse: (!) 52 (!) 53 (!) 54 (!) 57  Resp: (!) 24 (!) 24 (!) 25 20  Temp: 97.8 F (36.6 C) 97.6 F (36.4 C) 98 F (36.7 C) 98 F (36.7 C)  TempSrc: Oral Oral Oral Oral  SpO2: 98% 99% 100% 100%  Weight:   152 lb 1.9 oz (69 kg)   Height:        Intake/Output Summary (Last 24 hours) at 08/02/2017 0848 Last data filed at 08/02/2017 0700 Gross per 24 hour  Intake 720 ml  Output 4400 ml  Net -3680 ml   Filed Weights   08/01/17 0130 08/01/17 0400 08/02/17 0335  Weight: 163 lb 2.3 oz (74 kg) 161 lb 9.6 oz (73.3 kg) 152 lb 1.9 oz (69 kg)    Physical Exam   General appearance: alert and no distress Neck: no carotid bruit, no JVD and thyroid not enlarged, symmetric, no tenderness/mass/nodules Lungs: clear to auscultation bilaterally Heart: regular rate and rhythm, S1, S2 normal, no murmur, click, rub or gallop Abdomen: soft, non-tender; bowel sounds normal; no masses,  no organomegaly Extremities: extremities normal, atraumatic, no cyanosis or edema Pulses: 2+ and symmetric Skin: Skin color, texture, turgor normal. No rashes or  lesions Neurologic: Grossly normal Psych: Pleasant  Inpatient Medications    Scheduled Meds: . apixaban  5 mg Oral BID  . aspirin EC  81 mg Oral Daily  . carvedilol  3.125 mg Oral BID WC  . furosemide  40 mg Intravenous Q12H  . insulin aspart  0-9 Units Subcutaneous TID WC  . omega-3 acid ethyl esters  1 g Oral Daily  . pneumococcal 23 valent vaccine  0.5 mL Intramuscular Tomorrow-1000  . sacubitril-valsartan  1 tablet Oral BID  . simvastatin  40 mg Oral Daily    Continuous Infusions:   PRN Meds: acetaminophen **OR** acetaminophen, ondansetron **OR** ondansetron (ZOFRAN) IV   Labs   Results for orders placed or performed during the hospital encounter of 07/31/17 (from the past 48 hour(s))  Basic metabolic panel     Status: Abnormal   Collection Time: 07/31/17  7:00 PM  Result Value Ref Range   Sodium 141 135 - 145 mmol/L   Potassium 4.6 3.5 - 5.1 mmol/L   Chloride 107 98 - 111 mmol/L   CO2 24 22 - 32 mmol/L   Glucose, Bld 48 (L) 70 - 99 mg/dL   BUN 15 8 - 23 mg/dL   Creatinine, Ser 1.13 0.61 - 1.24 mg/dL   Calcium 9.7 8.9 - 10.3 mg/dL   GFR calc non Af Amer >60 >60 mL/min   GFR calc Af Amer >60 >60 mL/min  Comment: (NOTE) The eGFR has been calculated using the CKD EPI equation. This calculation has not been validated in all clinical situations. eGFR's persistently <60 mL/min signify possible Chronic Kidney Disease.    Anion gap 10 5 - 15    Comment: Performed at [facility] [address], [place] [address]  CBC     Status: None   Collection Time: 07/31/17  7:00 PM  Result Value Ref Range   WBC 7.5 4.0 - 10.5 K/uL   RBC 5.24 4.22 - 5.81 MIL/uL   Hemoglobin 15.5 13.0 - 17.0 g/dL   HCT 48.5 39.0 - 52.0 %   MCV 92.6 78.0 - 100.0 fL   MCH 29.6 26.0 - 34.0 pg   MCHC 32.0 30.0 - 36.0 g/dL   RDW 14.6 11.5 - 15.5 %   Platelets 193 150 - 400 K/uL    Comment: Performed at [facility] [address], [place], [place] [address]  I-stat  troponin, ED     Status: None   Collection Time: 07/31/17  7:09 PM  Result Value Ref Range   Troponin i, poc 0.03 0.00 - 0.08 ng/mL   Comment 3            Comment: Due to the release kinetics of cTnI, a negative result within the first hours of the onset of symptoms does not rule out myocardial infarction with certainty. If myocardial infarction is still suspected, repeat the test at appropriate intervals.   CBG monitoring, ED     Status: Abnormal   Collection Time: 07/31/17  8:22 PM  Result Value Ref Range   Glucose-Capillary 129 (H) 70 - 99 mg/dL  Brain natriuretic peptide     Status: Abnormal   Collection Time: 08/01/17  3:25 AM  Result Value Ref Range   B Natriuretic Peptide 800.3 (H) 0.0 - 100.0 pg/mL    Comment: Performed at [facility] [address], [place], [place] [address]  Basic metabolic panel     Status: Abnormal   Collection Time: 08/01/17  3:25 AM  Result Value Ref Range   Sodium 142 135 - 145 mmol/L   Potassium 3.7 3.5 - 5.1 mmol/L    Comment: NO VISIBLE HEMOLYSIS   Chloride 106 98 - 111 mmol/L   CO2 24 22 - 32 mmol/L   Glucose, Bld 108 (H) 70 - 99 mg/dL   BUN 12 8 - 23 mg/dL   Creatinine, Ser 0.95 0.61 - 1.24 mg/dL   Calcium 9.5 8.9 - 10.3 mg/dL   GFR calc non Af Amer >60 >60 mL/min   GFR calc Af Amer >60 >60 mL/min    Comment: (NOTE) The eGFR has been calculated using the CKD EPI equation. This calculation has not been validated in all clinical situations. eGFR's persistently <60 mL/min signify possible Chronic Kidney Disease.    Anion gap 12 5 - 15    Comment: Performed at [facility] [address], [place] [address]  CBC     Status: None   Collection Time: 08/01/17  3:25 AM  Result Value Ref Range   WBC 7.8 4.0 - 10.5 K/uL   RBC 5.23 4.22 - 5.81 MIL/uL   Hemoglobin 15.6 13.0 - 17.0 g/dL   HCT 47.4 39.0 - 52.0 %   MCV 90.6 78.0 - 100.0 fL   MCH 29.8 26.0 - 34.0 pg   MCHC 32.9 30.0 - 36.0 g/dL   RDW 14.2 11.5 - 15.5 %  Platelets 190 150 - 400 K/uL    Comment: Performed at [facility], [place] [address], [place], [place] [address]  Magnesium     Status: None   Collection Time: 08/01/17  3:25 AM  Result Value Ref Range   Magnesium 2.1 1.7 - 2.4 mg/dL    Comment: Performed at [facility], [place] [address], [place], [place] [address]  TSH     Status: None   Collection Time: 08/01/17  3:25 AM  Result Value Ref Range   TSH 3.811 0.350 - 4.500 uIU/mL    Comment: Performed by a 3rd Generation assay with a functional sensitivity of <=0.01 uIU/mL. Performed at [facility], [place] [address], [place], [place] [address]   MRSA PCR Screening     Status: None   Collection Time: 08/01/17  3:50 AM  Result Value Ref Range   MRSA by PCR NEGATIVE NEGATIVE    Comment:        The GeneXpert MRSA Assay (FDA approved for NASAL specimens only), is one component of a comprehensive MRSA colonization surveillance program. It is not intended to diagnose MRSA infection nor to guide or monitor treatment for MRSA infections. Performed at [facility], [place] [address], [place], [place] [address]   Glucose, capillary     Status: Abnormal   Collection Time: 08/01/17  8:09 AM  Result Value Ref Range   Glucose-Capillary 121 (H) 70 - 99 mg/dL   Comment 1 Notify RN    Comment 2 Document in Chart   Glucose, capillary     Status: Abnormal   Collection Time: 08/01/17 12:29 PM  Result Value Ref Range   Glucose-Capillary 112 (H) 70 - 99 mg/dL   Comment 1 Notify RN    Comment 2 Document in Chart   Hepatic function panel     Status: None   Collection Time: 08/01/17  4:15 PM  Result Value Ref Range   Total Protein 7.0 6.5 - 8.1 g/dL   Albumin 3.9 3.5 - 5.0 g/dL   AST 34 15 - 41 U/L   ALT 23 0 - 44 U/L   Alkaline Phosphatase 64 38 - 126 U/L   Total Bilirubin 1.0 0.3 - 1.2 mg/dL   Bilirubin, Direct 0.2 0.0 - 0.2 mg/dL   Indirect Bilirubin 0.8 0.3 - 0.9 mg/dL    Comment: Performed at [facility], [place] [address] [place], [place] [address]  Lipase, blood     Status: None   Collection Time: 08/01/17  4:15 PM  Result Value Ref Range   Lipase 48 11 - 51 U/L    Comment: Performed at [facility] [address] [place], [place] [address]  Glucose, capillary     Status: Abnormal   Collection Time: 08/01/17  4:17 PM  Result Value Ref Range   Glucose-Capillary 137 (H) 70 - 99 mg/dL   Comment 1 Notify RN    Comment 2 Document in Chart   Glucose, capillary     Status: Abnormal   Collection Time: 08/01/17  9:17 PM  Result Value Ref Range   Glucose-Capillary 179 (H) 70 - 99 mg/dL   Comment 1 Notify RN   Basic metabolic panel     Status: Abnormal   Collection Time: 08/02/17  2:45 AM  Result Value Ref Range   Sodium 141 135 - 145 mmol/L   Potassium 4.0 3.5 - 5.1 mmol/L    Comment: HEMOLYSIS AT THIS LEVEL  MAY AFFECT RESULT   Chloride 102 98 - 111 mmol/L   CO2 24 22 - 32 mmol/L   Glucose, Bld 124 (H) 70 - 99 mg/dL   BUN 19 8 - 23 mg/dL   Creatinine, Ser 1.18 0.61 - 1.24 mg/dL   Calcium 9.2 8.9 - 10.3 mg/dL   GFR calc non Af Amer >60 >60 mL/min   GFR calc Af Amer >60 >60 mL/min    Comment: (NOTE) The eGFR has been calculated using the CKD EPI equation. This calculation has not been validated in all clinical situations. eGFR's persistently <60 mL/min signify possible Chronic Kidney Disease.    Anion gap 15 5 - 15    Comment: Performed at [facility] [address] [place], [place] [address]  Glucose, capillary     Status: Abnormal   Collection Time: 08/02/17  8:10 AM  Result Value Ref Range   Glucose-Capillary 125 (H) 70 - 99 mg/dL   Comment 1 Notify RN    Comment 2 Document in Chart     ECG   N/A  Telemetry   Aflutter with SVR - Personally Reviewed  Radiology    Dg Chest 2 View  Result Date: 07/31/2017 CLINICAL DATA:  Short of breath EXAM: CHEST - 2 VIEW COMPARISON:  09/04/2016 FINDINGS: Cardiomegaly with vascular congestion. Mild interstitial opacity  suspect for minimal edema. Subsegmental atelectasis at the bases. No large effusion. No pneumothorax. IMPRESSION: Cardiomegaly with vascular congestion and mild interstitial edema. Electronically Signed   By: Donavan Foil M.D.   On: 07/31/2017 19:44    Cardiac Studies   N/A  Assessment   1. Principal Problem: 2.   Acute systolic CHF (congestive heart failure) ([facility]) 3. Active Problems: 4.   History of stroke 5.   Atrial flutter ([place]) 6.   Type 2 diabetes mellitus without complication, without long-term current use of insulin (HCC) 7.   Essential hypertension 8.   Acute CHF (congestive heart failure) ([place]) 9.   Acute on chronic systolic congestive heart failure ([place]) 10.   Persistent atrial fibrillation ([place]) 11.   Plan   1. Excellent response to diuretics. Switch to oral lasix today - increased dose of 40 mg daily. Can likely be discharged later today from a cardiology standpoint. Will need follow-up in 7-10 days TOC with APP or Dr. Angelena Form.  Time Spent Directly with Patient:  I have spent a total of 25 minutes with the patient reviewing hospital notes, telemetry, EKGs, labs and examining the patient as well as establishing an assessment and plan that was discussed personally with the patient.  > 50% of time was spent in direct patient care.  Length of Stay:  LOS: 1 day   Pixie Casino, MD, [facility], [place] Director of the Advanced Lipid Disorders &  Cardiovascular Risk Reduction Clinic Diplomate of the American Board of Clinical Lipidology Attending Cardiologist  Direct Dial: [phone]  Fax: [phone]  Website:  [email or web address] 08/02/2017, 8:48 AM

## 2017-08-11 ENCOUNTER — Ambulatory Visit: Payer: Medicaid Other | Admitting: Podiatry

## 2017-08-18 ENCOUNTER — Ambulatory Visit (INDEPENDENT_AMBULATORY_CARE_PROVIDER_SITE_OTHER): Payer: Medicaid Other | Admitting: Podiatry

## 2017-08-18 ENCOUNTER — Encounter: Payer: Self-pay | Admitting: Podiatry

## 2017-08-18 DIAGNOSIS — D689 Coagulation defect, unspecified: Secondary | ICD-10-CM

## 2017-08-18 DIAGNOSIS — B351 Tinea unguium: Secondary | ICD-10-CM

## 2017-08-18 DIAGNOSIS — M79674 Pain in right toe(s): Secondary | ICD-10-CM | POA: Diagnosis not present

## 2017-08-18 DIAGNOSIS — M79675 Pain in left toe(s): Secondary | ICD-10-CM | POA: Diagnosis not present

## 2017-08-21 NOTE — Progress Notes (Signed)
Subjective:   Patient ID: [name], male   DOB: 62 y.o.   MRN: [id number]   HPI Patient presents with elongated nailbeds 1-5 both feet that are thick yellow and brittle and they do become painful and he cannot cut   ROS      Objective:  Physical Exam  Chronic mycotic nail infection with pain 1-5 both feet with yellow brittle disease noted of the underlying nailbeds     Assessment:  Painful mycotic nail infection bilateral     Plan:  Debridement of painful nailbeds 1-5 both feet with no iatrogenic bleeding noted

## 2017-09-26 ENCOUNTER — Other Ambulatory Visit: Payer: Self-pay | Admitting: Physician Assistant

## 2017-09-29 NOTE — Telephone Encounter (Signed)
Pt last saw Dr [name] 02/12/17, last labs Creat 1.18, age 62, weight 76kg, based on specified criteria pt is on appropriate dosage of Eliquis 5mg  BID.  Will refill rx.

## 2017-10-10 ENCOUNTER — Telehealth: Payer: Self-pay | Admitting: Cardiology

## 2017-10-10 NOTE — Telephone Encounter (Signed)
Received page from patient's daughter.  He has been out of his home Lasix for a week.  He is otherwise feeling well.  She is requesting a refill on.  According to the patient's most recent documentation he was taking Lasix 40 mg daily.  I called in a refill for this medication.  Esmond Plants, MD

## 2017-11-26 ENCOUNTER — Ambulatory Visit: Payer: Medicaid Other | Admitting: Podiatry

## 2018-01-15 ENCOUNTER — Encounter: Payer: Self-pay | Admitting: Cardiovascular Disease

## 2018-01-15 ENCOUNTER — Ambulatory Visit: Payer: Medicaid Other | Admitting: Cardiovascular Disease

## 2018-01-15 VITALS — BP 118/80 | HR 51 | Ht 72.0 in | Wt 159.4 lb

## 2018-01-15 DIAGNOSIS — I2510 Atherosclerotic heart disease of native coronary artery without angina pectoris: Secondary | ICD-10-CM | POA: Diagnosis not present

## 2018-01-15 DIAGNOSIS — I5022 Chronic systolic (congestive) heart failure: Secondary | ICD-10-CM | POA: Diagnosis not present

## 2018-01-15 DIAGNOSIS — I483 Typical atrial flutter: Secondary | ICD-10-CM

## 2018-01-15 DIAGNOSIS — I428 Other cardiomyopathies: Secondary | ICD-10-CM

## 2018-01-15 DIAGNOSIS — E785 Hyperlipidemia, unspecified: Secondary | ICD-10-CM

## 2018-01-15 NOTE — Progress Notes (Signed)
Chief Complaint  Patient presents with  . Follow-up    cardiomyopathy   History of Present Illness: 63 yo male with history of CVA in 2010, expressive aphasia, chronic right sided weakness, HTN, DM, non-ischemic cardiomyopathy, chronic systolic CHF and atrial flutter who is here today for cardiac follow up. He was admitted to [facility] 09/04/16 with atrial flutter and was found to have LVEF=20%. Cardiac cath 09/06/16 with mild non-obstructive CAD. He was felt to have a non-ischemic cardiomyopathy. His heart rate was controlled on a low dose beta blocker. He was started on Eliquis. He was also started on Entresto. DCCV at [facility] 10/10/16. LVEF improved to 45-50% by echo November 2018. He was seen in our office 07/15/17 and was back in atrial flutter with heart rate in the 50s.  He was admitted to [facility] 07/31/17 with CHF and diuresed quickly with IV lasix. Home dosage of Lasix was increased to 40 mg daily.   He is here today for follow up. The patient denies any chest pain, dyspnea, palpitations, lower extremity edema, orthopnea, PND, dizziness, near syncope or syncope.   Primary Care Physician: Mirna Mires, MD  Past Medical History:  Diagnosis Date  . Atrial flutter (HCC)   . Diabetes mellitus   . Hypertension   . 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 Stroke [facility])     Past Surgical History:  Procedure Laterality Date  . CARDIOVERSION N/A 10/10/2016   Procedure: CARDIOVERSION;  Surgeon: Lewayne Bunting, MD;  Location: [facility] ENDOSCOPY;  Service: Cardiovascular;  Laterality: N/A;  . RIGHT/LEFT HEART CATH AND CORONARY ANGIOGRAPHY N/A 09/06/2016   Procedure: RIGHT/LEFT HEART CATH AND CORONARY ANGIOGRAPHY;  Surgeon: Lennette Bihari, MD;  Location: [facility] INVASIVE CV LAB;  Service: Cardiovascular;  Laterality: N/A;  . TRACHEOSTOMY      Current Outpatient Medications  Medication Sig Dispense Refill  . acetaminophen  (TYLENOL) 500 MG tablet Take 1,000 mg by mouth every 6 (six) hours as needed for pain.    Marland Kitchen aspirin EC 81 MG EC tablet Take 1 tablet (81 mg total) by mouth daily.    . carvedilol (COREG) 3.125 MG tablet Take 1 tablet (3.125 mg total) by mouth 2 (two) times daily with a meal. 60 tablet 10  . ELIQUIS 5 MG TABS tablet TAKE 1 TABLET(5 MG) BY MOUTH TWICE DAILY 60 tablet 5  . ENTRESTO 49-51 MG TAKE 1 TABLET BY MOUTH TWICE DAILY 180 tablet 3  . furosemide (LASIX) 40 MG tablet Take 1 tablet (40 mg total) by mouth daily. 30 tablet 1  . glipiZIDE (GLUCOTROL) 10 MG tablet Take 1 tablet (10 mg total) by mouth daily with breakfast. 30 tablet 6  . metFORMIN (GLUCOPHAGE) 1000 MG tablet Take 1,000 mg by mouth 2 (two) times daily.    . Multiple Vitamin (MULTIVITAMIN WITH MINERALS) TABS Take 1 tablet by mouth daily.    Marland Kitchen omega-3 acid ethyl esters (LOVAZA) 1 G capsule Take 1 g by mouth daily.    . simvastatin (ZOCOR) 40 MG tablet Take 40 mg by mouth daily.     No current facility-administered medications for this visit.     No Known Allergies  Social History   Socioeconomic History  . Marital status: Single    Spouse name: Not on file  . Number of children: Not on file  . Years of education: Not on file  . Highest education level: Not on file  Occupational History  . Not on file  Social Needs  . Financial resource strain: Not on file  . Food insecurity:    Worry: Not on file    Inability: Not on file  . Transportation needs:    Medical: Not on file    Non-medical: Not on file  Tobacco Use  . Smoking status: Never Smoker  . Smokeless tobacco: Never Used  Substance and Sexual Activity  . Alcohol use: No  . Drug use: No  . Sexual activity: Not Currently  Lifestyle  . Physical activity:    Days per week: Not on file    Minutes per session: Not on file  . Stress: Not on file  Relationships  . Social connections:    Talks on phone: Not on file    Gets together: Not on file    Attends  religious service: Not on file    Active member of club or organization: Not on file    Attends meetings of clubs or organizations: Not on file    Relationship status: Not on file  . Intimate partner violence:    Fear of current or ex partner: Not on file    Emotionally abused: Not on file    Physically abused: Not on file    Forced sexual activity: Not on file  Other Topics Concern  . Not on file  Social History Narrative  . Not on file    Family History  Problem Relation Age of Onset  . Hypertension Mother   . Diabetes Mother   . Heart disease Father     Review of Systems:  As stated in the HPI and otherwise negative.   BP 118/80   Pulse (!) 51   Ht 6' (1.829 m)   Wt 159 lb 6.4 oz (72.3 kg)   SpO2 96%   BMI 21.62 kg/m   Physical Examination:  General: Well developed, well nourished, NAD  HEENT: OP clear, mucus membranes moist  SKIN: warm, dry. No rashes. Neuro: No focal deficits  Musculoskeletal: Muscle strength 5/5 all ext  Psychiatric: Mood and affect normal  Neck: No JVD, no carotid bruits, no thyromegaly, no lymphadenopathy.  Lungs:Clear bilaterally, no wheezes, rhonci, crackles Cardiovascular: Regular rate and rhythm. No murmurs, gallops or rubs. Abdomen:Soft. Bowel sounds present. Non-tender.  Extremities: No lower extremity edema. Pulses are 2 + in the bilateral DP/PT.  Echo November 2018: - Left ventricle: The cavity size was normal. Wall thickness was   increased in a pattern of severe LVH. Systolic function was   mildly reduced. The estimated ejection fraction was in the range   of 45% to 50%. Diffuse hypokinesis. Doppler parameters are   consistent with abnormal left ventricular relaxation (grade 1   diastolic dysfunction). The E/e&' ratio is between 8-15,   suggesting indeterminate LV filling pressure. - Left atrium: The atrium was normal in size. - Tricuspid valve: There was mild regurgitation. - Pulmonary arteries: PA peak pressure: 29 mm Hg  (S).   Cardiac cath 09/06/16: Diagnostic Diagram        EKG:  EKG is  not ordered today. The ekg ordered today demonstrates   Recent Labs: 08/01/2017: ALT 23; B Natriuretic Peptide 800.3; Hemoglobin 15.6; Magnesium 2.1; Platelets 190; TSH 3.811 08/02/2017: BUN 19; Creatinine, Ser 1.18; Potassium 4.0; Sodium 141   Lipid Panel    Component Value Date/Time   CHOL 121 07/07/2007 2028   TRIG 140 07/07/2007 2028   HDL 31 (L) 07/07/2007 2028   CHOLHDL  3.9 Ratio 07/07/2007 2028   VLDL 28 07/07/2007 2028   LDLCALC 62 07/07/2007 2028     Wt Readings from Last 3 Encounters:  01/15/18 159 lb 6.4 oz (72.3 kg)  08/02/17 152 lb 1.9 oz (69 kg)  07/15/17 167 lb 9.6 oz (76 kg)     Other studies Reviewed: Additional studies/ records that were reviewed today include: . Review of the above records demonstrates:   Assessment and Plan:   1. Atrial flutter: He appears to be in atrial fib today on exam. Rate is controlled. Continue Coreg and Eliquis.     2. Non-ischemic cardiomyopathy: LVEF is now 45-50%.  He has no evidence of volume overload. Will continue Entresto and Coreg.   3. HLD: LDL 56 in November 2019 in primary care. Continue statin.   4. CAD without angina: No chest pain. Continue ASA, statin and beta blocker.   5. Chronic systolic CHF: Weight is stable. Volume status ok on Lasix. Continue Lasix.   Current medicines are reviewed at length with the patient today.  The patient does not have concerns regarding medicines.  The following changes have been made:  no change  Labs/ tests ordered today include:   No orders of the defined types were placed in this encounter.    Disposition:   Follow up with me in 6 months   Signed, Verne Carrow, MD 01/15/2018 4:21 PM    [facility] [address] [place], [place], [place]  [address] Phone: [phone]; Fax: [phone]

## 2018-01-15 NOTE — Patient Instructions (Signed)

## 2018-02-10 ENCOUNTER — Other Ambulatory Visit: Payer: Self-pay

## 2018-02-10 MED ORDER — FUROSEMIDE 40 MG PO TABS: 40.0000 mg | ORAL_TABLET | Freq: Every day | ORAL | 6 refills | Status: DC

## 2018-02-25 ENCOUNTER — Other Ambulatory Visit: Payer: Self-pay | Admitting: Cardiovascular Disease

## 2018-03-05 ENCOUNTER — Other Ambulatory Visit: Payer: Self-pay

## 2018-03-05 MED ORDER — CARVEDILOL 3.125 MG PO TABS: 3.1250 mg | ORAL_TABLET | Freq: Two times a day (BID) | ORAL | 3 refills | Status: DC

## 2018-03-17 ENCOUNTER — Other Ambulatory Visit: Payer: Self-pay

## 2018-03-17 MED ORDER — FUROSEMIDE 40 MG PO TABS: 40.0000 mg | ORAL_TABLET | Freq: Every day | ORAL | 6 refills | Status: DC

## 2018-03-24 ENCOUNTER — Other Ambulatory Visit: Payer: Self-pay | Admitting: Physician Assistant

## 2018-03-25 ENCOUNTER — Telehealth: Payer: Self-pay | Admitting: Nurse Practitioner

## 2018-03-25 NOTE — Telephone Encounter (Signed)
Pt pharmacy is requesting refill for entresto please address thank you.

## 2018-03-25 NOTE — Telephone Encounter (Signed)
   Pts niece calling this morning to report that her uncle, [name], will run out of entresto 49-51mg  in 2 days as he is awaiting on insurance approval for the refill.  She is wondering if she can obtain samples from our office.  I explained that I am on call and as it is after hours, I do not currently have access to our sample inventory, but that I would send a message to our triage team, so that we could look for samples and then call her back at some point tomorrow morning or early afternoon (callers name: [name] - niece - [phone]).  She understands that if no samples are available, we would be likely to switch him to losartan.  Caller verbalized understanding and was grateful for the call back.  Nicolasa Ducking, NP 03/25/2018, 7:43 PM

## 2018-03-26 NOTE — Telephone Encounter (Addendum)
Left message to call back.  Samples have been placed at the front desk.

## 2018-03-26 NOTE — Telephone Encounter (Signed)
Spoke with niece and she states they have already picked samples up.  Niece appreciative for call.

## 2018-03-31 ENCOUNTER — Other Ambulatory Visit: Payer: Self-pay | Admitting: Cardiovascular Disease

## 2018-04-01 ENCOUNTER — Telehealth: Payer: Self-pay | Admitting: Cardiovascular Disease

## 2018-04-01 NOTE — Telephone Encounter (Signed)
Sent to [facility] and April 04/01/18 to f/u on

## 2018-04-01 NOTE — Telephone Encounter (Signed)
Received PA from [facility] for [place]. I called pt's pharmacy since they had told me that it did not need a PA and they had looked at the wrong medication.   I called [facility] and since this is a reauthorization I had to print out a form, fill it out and send documentation.  Completed form and documentation were faxed to [facility] at [phone].   I called the Pt's Niece and gave her this information. She will keep in touch with the pharmacy to get Rx as soon as possible for the pt.

## 2018-04-01 NOTE — Telephone Encounter (Signed)
Pt last saw Dr [name] 01/15/18, last labs 08/02/17 Creat 1.18, age 63, weight 72.3kg, based on specified criteria pt is on appropriate dosage of Eliquis 5mg  BID.  Will refill rx.

## 2018-04-01 NOTE — Telephone Encounter (Signed)
  Patient's niece states that the patient is running out of [place] and they are still waiting to find out about insurance authorization. She states the pharmacy sent something over to [name] and they told her they are still waiting for a response from [name]. Can we let the niece know where we are with that?

## 2018-04-01 NOTE — Telephone Encounter (Signed)
I called Pt's pharmacy [facility]) they stated that her Rx was ready to be picked up with a $3 copay. I called the Niece back & LM with this information.

## 2018-04-08 ENCOUNTER — Ambulatory Visit: Payer: Medicaid Other | Admitting: Podiatry

## 2018-04-08 ENCOUNTER — Other Ambulatory Visit: Payer: Self-pay

## 2018-04-08 VITALS — Temp 96.7°F

## 2018-04-08 DIAGNOSIS — M79675 Pain in left toe(s): Secondary | ICD-10-CM | POA: Diagnosis not present

## 2018-04-08 DIAGNOSIS — L84 Corns and callosities: Secondary | ICD-10-CM | POA: Diagnosis not present

## 2018-04-08 DIAGNOSIS — B351 Tinea unguium: Secondary | ICD-10-CM | POA: Diagnosis not present

## 2018-04-08 DIAGNOSIS — M79674 Pain in right toe(s): Secondary | ICD-10-CM | POA: Diagnosis not present

## 2018-04-08 DIAGNOSIS — E119 Type 2 diabetes mellitus without complications: Secondary | ICD-10-CM

## 2018-04-10 ENCOUNTER — Encounter: Payer: Self-pay | Admitting: Podiatry

## 2018-04-10 NOTE — Progress Notes (Signed)
Subjective: Patient presents today for preventative diabetic foot carecc of painful, discolored, thick toenails which interfere with daily activities. Pain is aggravated when wearing enclosed shoe gear. Pain is getting progressively worse and relieved with periodic professional debridement.  He also has painful corn right 5th digit.  He voices no other concerns on today's visit.  Mirna Mires, MD is his PCP.    Current Outpatient Medications:  .  acetaminophen (TYLENOL) 500 MG tablet, Take 1,000 mg by mouth every 6 (six) hours as needed for pain., Disp: , Rfl:  .  aspirin EC 81 MG EC tablet, Take 1 tablet (81 mg total) by mouth daily., Disp: , Rfl:  .  carvedilol (COREG) 3.125 MG tablet, Take 1 tablet (3.125 mg total) by mouth 2 (two) times daily with a meal., Disp: 90 tablet, Rfl: 3 .  ELIQUIS 5 MG TABS tablet, TAKE 1 TABLET(5 MG) BY MOUTH TWICE DAILY, Disp: 60 tablet, Rfl: 5 .  ENTRESTO 49-51 MG, TAKE 1 TABLET BY MOUTH TWICE DAILY, Disp: 180 tablet, Rfl: 1 .  furosemide (LASIX) 40 MG tablet, Take 1 tablet (40 mg total) by mouth daily for 30 days., Disp: 30 tablet, Rfl: 6 .  glipiZIDE (GLUCOTROL) 10 MG tablet, Take 1 tablet (10 mg total) by mouth daily with breakfast., Disp: 30 tablet, Rfl: 6 .  metFORMIN (GLUCOPHAGE) 1000 MG tablet, Take 1,000 mg by mouth 2 (two) times daily., Disp: , Rfl:  .  Multiple Vitamin (MULTIVITAMIN WITH MINERALS) TABS, Take 1 tablet by mouth daily., Disp: , Rfl:  .  omega-3 acid ethyl esters (LOVAZA) 1 G capsule, Take 1 g by mouth daily., Disp: , Rfl:  .  simvastatin (ZOCOR) 40 MG tablet, Take 40 mg by mouth daily., Disp: , Rfl:    No Known Allergies   Objective:  Vascular Examination: Capillary refill time immediate x 10 digits.  Dorsalis pedis pulses palpable b/l.  Posterior tibial pulses faintly palpable b/l.  Digital hair absent x 10 digits.  Skin temperature gradient warm to cool b/l.  No ischemic changes noted b/l.  Dermatological  Examination: Skin with normal turgor, texture and tone b/l  Toenails 1-5 b/l discolored, thick, dystrophic with subungual debris and pain with palpation to nailbeds due to thickness of nails.  Hyperkeratotic lesion dorsal 5th PIPJ right 5th digit.  No erythema, no edema, no drainage, no flocculence noted.  No open wounds.  No interdigital macerations.  Musculoskeletal: Muscle strength 5/5 to all LE muscle groups.  Hammertoes 2-5 b/l.  HAV with bunion b/l.  Neurological: Sensation intact with 10 gram monofilament.  Vibratory sensation intact.  Assessment: 1. Painful onychomycosis toenails 1-5 b/l 2. NIDDM  Plan: 1. Toenails 1-5 b/l were debrided in length and girth without iatrogenic bleeding. Corn(s) pared right 5th digit utilizing sterile scalpel blade without incident. 2. Patient to continue soft, supportive shoe gear daily. 3. Patient to report any pedal injuries to medical professional immediately. 4. Follow up 3 months. 5. Patient/POA to call should there be a concern in the interim.

## 2018-07-08 ENCOUNTER — Encounter: Payer: Self-pay | Admitting: Podiatry

## 2018-07-08 ENCOUNTER — Ambulatory Visit: Payer: Medicaid Other | Admitting: Podiatry

## 2018-07-08 ENCOUNTER — Other Ambulatory Visit: Payer: Self-pay

## 2018-07-08 VITALS — Temp 98.2°F

## 2018-07-08 DIAGNOSIS — M79675 Pain in left toe(s): Secondary | ICD-10-CM

## 2018-07-08 DIAGNOSIS — B351 Tinea unguium: Secondary | ICD-10-CM

## 2018-07-08 DIAGNOSIS — M79674 Pain in right toe(s): Secondary | ICD-10-CM

## 2018-07-08 NOTE — Progress Notes (Signed)
Subjective: [name] presents today with painful, thick toenails 1-5 b/l that he cannot cut and which interfere with daily activities.  Pain is aggravated when wearing enclosed shoe gear.  He is accompanied by his caretaker on today.  Neither of them relate any new problems on today's visit.   Current Outpatient Medications:  .  acetaminophen (TYLENOL) 500 MG tablet, Take 1,000 mg by mouth every 6 (six) hours as needed for pain., Disp: , Rfl:  .  aspirin EC 81 MG EC tablet, Take 1 tablet (81 mg total) by mouth daily., Disp: , Rfl:  .  carvedilol (COREG) 3.125 MG tablet, Take 1 tablet (3.125 mg total) by mouth 2 (two) times daily with a meal., Disp: 90 tablet, Rfl: 3 .  ELIQUIS 5 MG TABS tablet, TAKE 1 TABLET(5 MG) BY MOUTH TWICE DAILY, Disp: 60 tablet, Rfl: 5 .  ENTRESTO 49-51 MG, TAKE 1 TABLET BY MOUTH TWICE DAILY, Disp: 180 tablet, Rfl: 1 .  furosemide (LASIX) 40 MG tablet, Take 1 tablet (40 mg total) by mouth daily for 30 days., Disp: 30 tablet, Rfl: 6 .  glipiZIDE (GLUCOTROL) 10 MG tablet, Take 1 tablet (10 mg total) by mouth daily with breakfast., Disp: 30 tablet, Rfl: 6 .  loratadine (CLARITIN) 10 MG tablet, TK 1 T PO D, Disp: , Rfl:  .  metFORMIN (GLUCOPHAGE) 1000 MG tablet, Take 1,000 mg by mouth 2 (two) times daily., Disp: , Rfl:  .  Multiple Vitamin (MULTIVITAMIN WITH MINERALS) TABS, Take 1 tablet by mouth daily., Disp: , Rfl:  .  omega-3 acid ethyl esters (LOVAZA) 1 G capsule, Take 1 g by mouth daily., Disp: , Rfl:  .  simvastatin (ZOCOR) 40 MG tablet, Take 40 mg by mouth daily., Disp: , Rfl:   No Known Allergies  Objective: Vitals:   07/08/18 1344  Temp: 98.2 F (36.8 C)    Vascular Examination: Capillary refill time immediate x 10 digits  Dorsalis pedis palpable b/l.  Posterior tibial pulses faintly palpable b/l.  Digital hair absent x 10 digits.  Skin temperature gradient warm to cool b/l.  Dermatological Examination: Skin with normal turgor, texture  and tone b/l  Toenails 1-5 b/l discolored, thick, dystrophic with subungual debris and pain with palpation to nailbeds due to thickness of nails.  Musculoskeletal: Muscle strength 5/5 to all LE muscle groups  HAV with bunion b/l.  Hammertoes 2-5 b/l.  No pain, crepitus or joint limitation noted with ROM.   Neurological: Sensation intact with 10 gram monofilament.  Vibratory sensation intact.  Assessment: Painful onychomycosis toenails 1-5 b/l   Plan: 1. Toenails 1-5 b/l were debrided in length and girth without iatrogenic bleeding. 2. Patient to continue soft, supportive shoe gear daily. 3. Patient to report any pedal injuries to medical professional immediately. 4. Follow up 3 months.  5. Patient/POA to call should there be a concern in the interim.

## 2018-07-08 NOTE — Patient Instructions (Signed)
Diabetes Mellitus and Foot Care Foot care is an important part of your health, especially when you have diabetes. Diabetes may cause you to have problems because of poor blood flow (circulation) to your feet and legs, which can cause your skin to:  Become thinner and drier.  Break more easily.  Heal more slowly.  Peel and crack. You may also have nerve damage (neuropathy) in your legs and feet, causing decreased feeling in them. This means that you may not notice minor injuries to your feet that could lead to more serious problems. Noticing and addressing any potential problems early is the best way to prevent future foot problems. How to care for your feet Foot hygiene  Wash your feet daily with warm water and mild soap. Do not use hot water. Then, pat your feet and the areas between your toes until they are completely dry. Do not soak your feet as this can dry your skin.  Trim your toenails straight across. Do not dig under them or around the cuticle. File the edges of your nails with an emery board or nail file.  Apply a moisturizing lotion or petroleum jelly to the skin on your feet and to dry, brittle toenails. Use lotion that does not contain alcohol and is unscented. Do not apply lotion between your toes. Shoes and socks  Wear clean socks or stockings every day. Make sure they are not too tight. Do not wear knee-high stockings since they may decrease blood flow to your legs.  Wear shoes that fit properly and have enough cushioning. Always look in your shoes before you put them on to be sure there are no objects inside.  To break in new shoes, wear them for just a few hours a day. This prevents injuries on your feet. Wounds, scrapes, corns, and calluses  Check your feet daily for blisters, cuts, bruises, sores, and redness. If you cannot see the bottom of your feet, use a mirror or ask someone for help.  Do not cut corns or calluses or try to remove them with medicine.  If you  find a minor scrape, cut, or break in the skin on your feet, keep it and the skin around it clean and dry. You may clean these areas with mild soap and water. Do not clean the area with peroxide, alcohol, or iodine.  If you have a wound, scrape, corn, or callus on your foot, look at it several times a day to make sure it is healing and not infected. Check for: ? Redness, swelling, or pain. ? Fluid or blood. ? Warmth. ? Pus or a bad smell. General instructions  Do not cross your legs. This may decrease blood flow to your feet.  Do not use heating pads or hot water bottles on your feet. They may burn your skin. If you have lost feeling in your feet or legs, you may not know this is happening until it is too late.  Protect your feet from hot and cold by wearing shoes, such as at the beach or on hot pavement.  Schedule a complete foot exam at least once a year (annually) or more often if you have foot problems. If you have foot problems, report any cuts, sores, or bruises to your health care provider immediately. Contact a health care provider if:  You have a medical condition that increases your risk of infection and you have any cuts, sores, or bruises on your feet.  You have an injury that is not   healing.  You have redness on your legs or feet.  You feel burning or tingling in your legs or feet.  You have pain or cramps in your legs and feet.  Your legs or feet are numb.  Your feet always feel cold.  You have pain around a toenail. Get help right away if:  You have a wound, scrape, corn, or callus on your foot and: ? You have pain, swelling, or redness that gets worse. ? You have fluid or blood coming from the wound, scrape, corn, or callus. ? Your wound, scrape, corn, or callus feels warm to the touch. ? You have pus or a bad smell coming from the wound, scrape, corn, or callus. ? You have a fever. ? You have a red line going up your leg. Summary  Check your feet every day  for cuts, sores, red spots, swelling, and blisters.  Moisturize feet and legs daily.  Wear shoes that fit properly and have enough cushioning.  If you have foot problems, report any cuts, sores, or bruises to your health care provider immediately.  Schedule a complete foot exam at least once a year (annually) or more often if you have foot problems. This information is not intended to replace advice given to you by your health care provider. Make sure you discuss any questions you have with your health care provider. Document Released: 12/22/1999 Document Revised: 02/05/2017 Document Reviewed: 01/26/2016 Elsevier Patient Education  2020 [employer] Inc.  

## 2018-08-27 ENCOUNTER — Other Ambulatory Visit: Payer: Self-pay | Admitting: Cardiovascular Disease

## 2018-08-27 MED ORDER — CARVEDILOL 3.125 MG PO TABS: 3.1250 mg | ORAL_TABLET | Freq: Two times a day (BID) | ORAL | 1 refills | Status: DC

## 2018-09-17 ENCOUNTER — Other Ambulatory Visit: Payer: Self-pay | Admitting: Cardiovascular Disease

## 2018-09-17 MED ORDER — FUROSEMIDE 40 MG PO TABS: 40.0000 mg | ORAL_TABLET | Freq: Every day | ORAL | 3 refills | Status: DC

## 2018-09-22 ENCOUNTER — Other Ambulatory Visit: Payer: Self-pay | Admitting: Cardiovascular Disease

## 2018-09-22 MED ORDER — ENTRESTO 49-51 MG PO TABS: 1.0000 | ORAL_TABLET | Freq: Two times a day (BID) | ORAL | 1 refills | Status: DC

## 2018-09-24 ENCOUNTER — Other Ambulatory Visit: Payer: Self-pay | Admitting: Pharmacist

## 2018-09-24 DIAGNOSIS — I4892 Unspecified atrial flutter: Secondary | ICD-10-CM

## 2018-09-24 DIAGNOSIS — Z8673 Personal history of transient ischemic attack (TIA), and cerebral infarction without residual deficits: Secondary | ICD-10-CM

## 2018-09-24 NOTE — Progress Notes (Signed)
Received faxed refill request for Eliquis. Age 63, weight 72.3kg, SCr 1.18 on 08/02/17, last OV January 2020, afib indication.  Called pt to schedule BMET and CBC since he is overdue. Left message for pt.

## 2018-09-25 MED ORDER — APIXABAN 5 MG PO TABS: 5.0000 mg | ORAL_TABLET | Freq: Two times a day (BID) | ORAL | 5 refills | Status: DC

## 2018-09-29 ENCOUNTER — Telehealth: Payer: Self-pay

## 2018-09-29 NOTE — Telephone Encounter (Signed)
**Note De-Identified [name] Obfuscation** We have filled out a [place] [facility] [place] form, Dr Radford Pax (DOD) has signed it and we have faxed it ti [employer] at [phone].

## 2018-09-30 ENCOUNTER — Other Ambulatory Visit: Payer: Medicaid Other | Admitting: *Deleted

## 2018-09-30 ENCOUNTER — Other Ambulatory Visit: Payer: Self-pay

## 2018-09-30 DIAGNOSIS — I4892 Unspecified atrial flutter: Secondary | ICD-10-CM

## 2018-09-30 DIAGNOSIS — Z8673 Personal history of transient ischemic attack (TIA), and cerebral infarction without residual deficits: Secondary | ICD-10-CM

## 2018-09-30 LAB — CBC
Hematocrit: 48.2 % (ref 37.5–51.0)
Hemoglobin: 15.8 g/dL (ref 13.0–17.7)
MCH: 30.7 pg (ref 26.6–33.0)
MCHC: 32.8 g/dL (ref 31.5–35.7)
MCV: 94 fL (ref 79–97)
Platelets: 225 x10E3/uL (ref 150–450)
RBC: 5.15 x10E6/uL (ref 4.14–5.80)
RDW: 13.9 % (ref 11.6–15.4)
WBC: 8 x10E3/uL (ref 3.4–10.8)

## 2018-09-30 LAB — BASIC METABOLIC PANEL WITH GFR
BUN/Creatinine Ratio: 11 (ref 10–24)
BUN: 12 mg/dL (ref 8–27)
CO2: 23 mmol/L (ref 20–29)
Calcium: 9.9 mg/dL (ref 8.6–10.2)
Chloride: 99 mmol/L (ref 96–106)
Creatinine, Ser: 1.14 mg/dL (ref 0.76–1.27)
GFR calc Af Amer: 79 mL/min/1.73
GFR calc non Af Amer: 69 mL/min/1.73
Glucose: 127 mg/dL — ABNORMAL HIGH (ref 65–99)
Potassium: 4.6 mmol/L (ref 3.5–5.2)
Sodium: 137 mmol/L (ref 134–144)

## 2018-10-06 ENCOUNTER — Other Ambulatory Visit: Payer: Self-pay

## 2018-10-06 ENCOUNTER — Ambulatory Visit (INDEPENDENT_AMBULATORY_CARE_PROVIDER_SITE_OTHER): Payer: Medicaid Other | Admitting: Podiatry

## 2018-10-06 ENCOUNTER — Encounter: Payer: Self-pay | Admitting: Podiatry

## 2018-10-06 DIAGNOSIS — B351 Tinea unguium: Secondary | ICD-10-CM | POA: Diagnosis not present

## 2018-10-06 DIAGNOSIS — M79675 Pain in left toe(s): Secondary | ICD-10-CM

## 2018-10-06 DIAGNOSIS — M79674 Pain in right toe(s): Secondary | ICD-10-CM

## 2018-10-06 DIAGNOSIS — E119 Type 2 diabetes mellitus without complications: Secondary | ICD-10-CM

## 2018-10-06 NOTE — Progress Notes (Signed)
Subjective: [name] is a 63 y.o. y.o. male who presents today for preventative diabetic foot care with cc of painful, discolored, thick toenails and corn which interfere with daily activities. Pain is aggravated when wearing enclosed shoe gear and relieved with periodic professional debridement.  Iona Beard, MD is his PCP.   Current Outpatient Medications on File Prior to Visit  Medication Sig Dispense Refill  . acetaminophen (TYLENOL) 500 MG tablet Take 1,000 mg by mouth every 6 (six) hours as needed for pain.    Marland Kitchen apixaban (ELIQUIS) 5 MG TABS tablet Take 1 tablet (5 mg total) by mouth 2 (two) times daily. 60 tablet 5  . aspirin EC 81 MG EC tablet Take 1 tablet (81 mg total) by mouth daily.    . carvedilol (COREG) 3.125 MG tablet Take 1 tablet (3.125 mg total) by mouth 2 (two) times daily with a meal. 180 tablet 1  . furosemide (LASIX) 40 MG tablet Take 1 tablet (40 mg total) by mouth daily. 30 tablet 3  . glipiZIDE (GLUCOTROL) 10 MG tablet Take 1 tablet (10 mg total) by mouth daily with breakfast. 30 tablet 6  . loratadine (CLARITIN) 10 MG tablet TK 1 T PO D    . metFORMIN (GLUCOPHAGE) 1000 MG tablet Take 1,000 mg by mouth 2 (two) times daily.    . Multiple Vitamin (MULTIVITAMIN WITH MINERALS) TABS Take 1 tablet by mouth daily.    Marland Kitchen omega-3 acid ethyl esters (LOVAZA) 1 G capsule Take 1 g by mouth daily.    . sacubitril-valsartan (ENTRESTO) 49-51 MG Take 1 tablet by mouth 2 (two) times daily. Please make yearly appt with Dr. Angelena Form for January for future refills. 1st attempt 180 tablet 1  . simvastatin (ZOCOR) 40 MG tablet Take 40 mg by mouth daily.     No current facility-administered medications on file prior to visit.     No Known Allergies  Objective:  Vascular Examination: Capillary refill time immediate x 10 digits.  Dorsalis pedis pulses palpable b/l.  Posterior tibial pulses palpable b/l.  Digital hair absent b/l. Skin temperature gradient WNL  b/l.  Dermatological Examination: Skin with normal turgor, texture and tone b/l.  Toenails 1-5 b/l discolored, thick, dystrophic with subungual debris and pain with palpation to nailbeds due to thickness of nails.  Hyperkeratotic lesion dorsal 5th PIPJ. No erythema, no edema, no drainage, no flocculence noted.   Musculoskeletal: Muscle strength 5/5 to all LE muscle groups LLE. Flaccid RLE secondary to CVA.  Hammertoes 2-5 b/l.  HAV with bunion b/l.  Neurological: Sensation intact 5/5 b/l with 10 gram monofilament.  Vibratory sensation intact b/l.  Assessment: 1. Painful onychomycosis toenails 1-5 b/l 2.   Corn right 5th digit 4.  NIDDM  Plan: 1. Continue diabetic foot care principles. Literature dispensed on today. 2. Toenails 1-5 b/l were debrided in length and girth without iatrogenic bleeding. 3. Hyperkeratotic lesion(s) right 5th digit pared with sterile scalpel blade without incident. 4.  Patient to continue soft, supportive shoe gear daily. 5. Patient to report any pedal injuries to medical professional immediately. 6. Follow up 3 months.  7. Patient/POA to call should there be a concern in the interim.

## 2018-10-06 NOTE — Patient Instructions (Signed)

## 2019-01-12 ENCOUNTER — Ambulatory Visit: Payer: Medicaid Other | Admitting: Podiatry

## 2019-01-26 NOTE — Progress Notes (Signed)
Cardiology Office Note    Date:  01/27/2019   ID:  [name], DOB 05/01/1955, MRN [id number]  PCP:  Iona Beard, MD  Cardiologist: Lauree Chandler, MD EPS: None  No chief complaint on file.   History of Present Illness:  [name] is a 64 y.o. male with history of CVA in 2010, expressive aphasia, chronic right sided weakness, HTN, DM, non-ischemic cardiomyopathy, chronic systolic CHF and atrial flutter.He was admitted to [facility] 09/04/16 with atrial flutter and was found to have LVEF=20%. Cardiac cath 09/06/16 with mild non-obstructive CAD. He was felt to have a non-ischemic cardiomyopathy. His heart rate was controlled on a low dose beta blocker. He was started on Eliquis. He was also started on Entresto. DCCV at [facility] 10/10/16. LVEF improved to 45-50% by echo November 2018. Back in Atrial flutter 07/2017 rate controlled.  Last saw Dr. Angelena Form 01/15/18 and doing well.   Patient comes in accompanied by his niece. Denies chest pain, shortness of breath, dizziness or presynocpe. Had some right leg swelling a couple of weeks ago relieved with compression hose and elevation. May have gotten extra salt in his diet. Walks a lot, does house chores and leg strengthening exercises daily.    Past Medical History:  Diagnosis Date  . Atrial flutter ([place])   . Diabetes mellitus   . Hypertension   . NICM (nonischemic cardiomyopathy) ([place]) 08/2016   Mild, nonobstructive CAD at cath, EF initially 20%, improved to 50% 11/2016 echo  . Stroke [facility])     Past Surgical History:  Procedure Laterality Date  . CARDIOVERSION N/A 10/10/2016   Procedure: CARDIOVERSION;  Surgeon: Lelon Perla, MD;  Location: [facility] ENDOSCOPY;  Service: Cardiovascular;  Laterality: N/A;  . RIGHT/LEFT HEART CATH AND CORONARY ANGIOGRAPHY N/A 09/06/2016   Procedure: RIGHT/LEFT HEART CATH AND CORONARY ANGIOGRAPHY;  Surgeon: Troy Sine, MD;  Location: [place] CV LAB;  Service: Cardiovascular;  Laterality: N/A;    . TRACHEOSTOMY      Current Medications: Current Meds  Medication Sig  . acetaminophen (TYLENOL) 500 MG tablet Take 1,000 mg by mouth every 6 (six) hours as needed for pain.  Marland Kitchen apixaban (ELIQUIS) 5 MG TABS tablet Take 1 tablet (5 mg total) by mouth 2 (two) times daily.  Marland Kitchen aspirin EC 81 MG EC tablet Take 1 tablet (81 mg total) by mouth daily.  . carvedilol (COREG) 3.125 MG tablet Take 1 tablet (3.125 mg total) by mouth 2 (two) times daily with a meal.  . furosemide (LASIX) 40 MG tablet Take 1 tablet (40 mg total) by mouth daily.  Marland Kitchen glipiZIDE (GLUCOTROL) 10 MG tablet Take 1 tablet (10 mg total) by mouth daily with breakfast.  . loratadine (CLARITIN) 10 MG tablet TK 1 T PO D  . metFORMIN (GLUCOPHAGE) 1000 MG tablet Take 1,000 mg by mouth 2 (two) times daily.  . Multiple Vitamin (MULTIVITAMIN WITH MINERALS) TABS Take 1 tablet by mouth daily.  Marland Kitchen omega-3 acid ethyl esters (LOVAZA) 1 G capsule Take 1 g by mouth daily.  . sacubitril-valsartan (ENTRESTO) 49-51 MG Take 1 tablet by mouth 2 (two) times daily. Please make yearly appt with Dr. Angelena Form for January for future refills. 1st attempt  . simvastatin (ZOCOR) 40 MG tablet Take 40 mg by mouth daily.     Allergies:   Patient has no known allergies.   Social History   Socioeconomic History  . Marital status: Single    Spouse name: Not on file  . Number of children: Not on  file  . Years of education: Not on file  . Highest education level: Not on file  Occupational History  . Not on file  Tobacco Use  . Smoking status: Never Smoker  . Smokeless tobacco: Never Used  Substance and Sexual Activity  . Alcohol use: No  . Drug use: No  . Sexual activity: Not Currently  Other Topics Concern  . Not on file  Social History Narrative  . Not on file   Social Determinants of Health   Financial Resource Strain:   . Difficulty of Paying Living Expenses: Not on file  Food Insecurity:   . Worried About Charity fundraiser in the Last Year:  Not on file  . Ran Out of Food in the Last Year: Not on file  Transportation Needs:   . Lack of Transportation (Medical): Not on file  . Lack of Transportation (Non-Medical): Not on file  Physical Activity:   . Days of Exercise per Week: Not on file  . Minutes of Exercise per Session: Not on file  Stress:   . Feeling of Stress : Not on file  Social Connections:   . Frequency of Communication with Friends and Family: Not on file  . Frequency of Social Gatherings with Friends and Family: Not on file  . Attends Religious Services: Not on file  . Active Member of Clubs or Organizations: Not on file  . Attends Archivist Meetings: Not on file  . Marital Status: Not on file     Family History:  The patient's   family history includes Diabetes in his mother; Heart disease in his father; Hypertension in his mother.   ROS:   Please see the history of present illness.    ROS All other systems reviewed and are negative.   PHYSICAL EXAM:   VS:  BP 124/64   Pulse 73   Ht 6' (1.829 m)   Wt 152 lb 3.2 oz (69 kg)   SpO2 99%   BMI 20.64 kg/m   Physical Exam  IWL:NLGX, in no acute distress  Neck: no JVD, carotid bruits, or masses Cardiac:irreg; S4 2/6 systolic murmur apex Respiratory:  clear to auscultation bilaterally, normal work of breathing GI: soft, nontender, nondistended, + BS Ext: without cyanosis, clubbing, or edema, Good distal pulses bilaterally Neuro:  Alert and Oriented x 3 right sided weakness from CVA Psych: euthymic mood, full affect  Wt Readings from Last 3 Encounters:  01/27/19 152 lb 3.2 oz (69 kg)  01/15/18 159 lb 6.4 oz (72.3 kg)  08/02/17 152 lb 1.9 oz (69 kg)      Studies/Labs Reviewed:   EKG:  EKG is  ordered today.  The ekg ordered today demonstrates Atrial flutter with controlled ventricular rate, poor R wave progression anteriorly, nonspecific ST-T wave changes, unchanged from EKG 08/01/2017  Recent Labs: 09/30/2018: BUN 12; Creatinine, Ser  1.14; Hemoglobin 15.8; Platelets 225; Potassium 4.6; Sodium 137   Lipid Panel    Component Value Date/Time   CHOL 121 07/07/2007 2028   TRIG 140 07/07/2007 2028   HDL 31 (L) 07/07/2007 2028   CHOLHDL 3.9 Ratio 07/07/2007 2028   VLDL 28 07/07/2007 2028   LDLCALC 62 07/07/2007 2028    Additional studies/ records that were reviewed today include:   Echo November 2018: - Left ventricle: The cavity size was normal. Wall thickness was   increased in a pattern of severe LVH. Systolic function was   mildly reduced. The estimated ejection fraction was in the  range   of 45% to 50%. Diffuse hypokinesis. Doppler parameters are   consistent with abnormal left ventricular relaxation (grade 1   diastolic dysfunction). The E/e&' ratio is between 8-15,   suggesting indeterminate LV filling pressure. - Left atrium: The atrium was normal in size. - Tricuspid valve: There was mild regurgitation. - Pulmonary arteries: PA peak pressure: 29 mm Hg (S).       ASSESSMENT:    1. Atrial flutter, unspecified type ([place])   2. Dilated cardiomyopathy ([place])   3. Chronic systolic CHF (congestive heart failure) ([place])   4. Hyperlipidemia, unspecified hyperlipidemia type      PLAN:  In order of problems listed above:     Atrial flutter: on Coreg and Eliquis.     Asymptomatic, rate controlled.  We will check surveillance labs with be met and CBC today.  No bleeding problems on Eliquis   Non-ischemic cardiomyopathy: LVEF is now 45-50%.  He has no evidence of volume overload. Will continue Entresto and Coreg.   Some swelling couple weeks ago.  And he says he may have gotten extra salt in his diet.   Chronic systolic CHF:  Compensated  HLD: LDL   Continue statin.  Will need to fasting lipid panel with next blood work             Medication Adjustments/Labs and Tests Ordered: Current medicines are reviewed at length with the patient today.  Concerns regarding medicines are outlined above.   Medication changes, Labs and Tests ordered today are listed in the Patient Instructions below. Patient Instructions  Medication Instructions:  Your physician recommends that you continue on your current medications as directed. Please refer to the Current Medication list given to you today.  *If you need a refill on your cardiac medications before your next appointment, please call your pharmacy*  Lab Work: TODAY: CBC, BMET  If you have labs (blood work) drawn today and your tests are completely normal, you will receive your results only by: Marland Kitchen MyChart Message (if you have MyChart) OR . A paper copy in the mail If you have any lab test that is abnormal or we need to change your treatment, we will call you to review the results.  Testing/Procedures: None ordered  Follow-Up: At [facility], you and your health needs are our priority.  As part of our continuing mission to provide you with exceptional heart care, we have created designated Provider Care Teams.  These Care Teams include your primary Cardiologist (physician) and Advanced Practice Providers (APPs -  Physician Assistants and Nurse Practitioners) who all work together to provide you with the care you need, when you need it.  Your next appointment:   6 month(s)  The format for your next appointment:   In Person  Provider:   You may see Lauree Chandler, MD or one of the following Advanced Practice Providers on your designated Care Team:    Melina Copa, PA-C  Ermalinda Barrios, PA-C   Other Instructions      Signed, Ermalinda Barrios, PA-C  01/27/2019 10:41 AM    [address], [place], [place]  [address] Phone: [phone]; Fax: [phone]

## 2019-01-27 ENCOUNTER — Other Ambulatory Visit: Payer: Self-pay

## 2019-01-27 ENCOUNTER — Ambulatory Visit: Payer: Medicaid Other | Admitting: Physician Assistant

## 2019-01-27 ENCOUNTER — Encounter: Payer: Self-pay | Admitting: Physician Assistant

## 2019-01-27 ENCOUNTER — Encounter (INDEPENDENT_AMBULATORY_CARE_PROVIDER_SITE_OTHER): Payer: Self-pay

## 2019-01-27 VITALS — BP 124/64 | HR 73 | Ht 72.0 in | Wt 152.2 lb

## 2019-01-27 DIAGNOSIS — I5022 Chronic systolic (congestive) heart failure: Secondary | ICD-10-CM

## 2019-01-27 DIAGNOSIS — E785 Hyperlipidemia, unspecified: Secondary | ICD-10-CM

## 2019-01-27 DIAGNOSIS — I420 Dilated cardiomyopathy: Secondary | ICD-10-CM | POA: Diagnosis not present

## 2019-01-27 DIAGNOSIS — I4892 Unspecified atrial flutter: Secondary | ICD-10-CM | POA: Diagnosis not present

## 2019-01-27 LAB — CBC
Hematocrit: 53.2 % — ABNORMAL HIGH (ref 37.5–51.0)
Hemoglobin: 17.2 g/dL (ref 13.0–17.7)
MCH: 29.3 pg (ref 26.6–33.0)
MCHC: 32.3 g/dL (ref 31.5–35.7)
MCV: 91 fL (ref 79–97)
Platelets: 224 10*3/uL (ref 150–450)
RBC: 5.87 x10E6/uL — ABNORMAL HIGH (ref 4.14–5.80)
RDW: 14 % (ref 11.6–15.4)
WBC: 6.5 10*3/uL (ref 3.4–10.8)

## 2019-01-27 LAB — BASIC METABOLIC PANEL
BUN/Creatinine Ratio: 9 — ABNORMAL LOW (ref 10–24)
BUN: 12 mg/dL (ref 8–27)
CO2: 20 mmol/L (ref 20–29)
Calcium: 9.6 mg/dL (ref 8.6–10.2)
Chloride: 101 mmol/L (ref 96–106)
Creatinine, Ser: 1.29 mg/dL — ABNORMAL HIGH (ref 0.76–1.27)
GFR calc Af Amer: 68 mL/min/{1.73_m2} (ref >59)
GFR calc non Af Amer: 59 mL/min/{1.73_m2} — ABNORMAL LOW (ref >59)
Glucose: 144 mg/dL — ABNORMAL HIGH (ref 65–99)
Potassium: 4.3 mmol/L (ref 3.5–5.2)
Sodium: 140 mmol/L (ref 134–144)

## 2019-01-27 NOTE — Patient Instructions (Signed)
Medication Instructions:  Your physician recommends that you continue on your current medications as directed. Please refer to the Current Medication list given to you today.  *If you need a refill on your cardiac medications before your next appointment, please call your pharmacy*  Lab Work: TODAY: CBC, BMET  If you have labs (blood work) drawn today and your tests are completely normal, you will receive your results only by: Marland Kitchen MyChart Message (if you have MyChart) OR . A paper copy in the mail If you have any lab test that is abnormal or we need to change your treatment, we will call you to review the results.  Testing/Procedures: None ordered  Follow-Up: At [facility], you and your health needs are our priority.  As part of our continuing mission to provide you with exceptional heart care, we have created designated Provider Care Teams.  These Care Teams include your primary Cardiologist (physician) and Advanced Practice Providers (APPs -  Physician Assistants and Nurse Practitioners) who all work together to provide you with the care you need, when you need it.  Your next appointment:   6 month(s)  The format for your next appointment:   In Person  Provider:   You may see Verne Carrow, MD or one of the following Advanced Practice Providers on your designated Care Team:    Ronie Spies, PA-C  Jacolyn Reedy, PA-C   Other Instructions

## 2019-01-29 ENCOUNTER — Other Ambulatory Visit: Payer: Self-pay | Admitting: Cardiovascular Disease

## 2019-01-29 MED ORDER — FUROSEMIDE 40 MG PO TABS: 40.0000 mg | ORAL_TABLET | Freq: Every day | ORAL | 11 refills | Status: DC

## 2019-02-23 ENCOUNTER — Telehealth: Payer: Self-pay | Admitting: Cardiovascular Disease

## 2019-02-23 DIAGNOSIS — I5022 Chronic systolic (congestive) heart failure: Secondary | ICD-10-CM

## 2019-02-23 NOTE — Telephone Encounter (Signed)
Left message to call back  

## 2019-02-23 NOTE — Telephone Encounter (Signed)
New message  Patient's daughter returning call about results. Please give patient's daughter a call back.

## 2019-03-03 NOTE — Telephone Encounter (Signed)
Left message to call back  

## 2019-03-04 NOTE — Telephone Encounter (Signed)
Vilinda Boehringer, Niece of the patient was calling to discuss results. Please contact the niece on the number provided [phone])  and not the primary number for the patient.

## 2019-03-05 MED ORDER — FUROSEMIDE 40 MG PO TABS: ORAL_TABLET | ORAL | 11 refills | Status: DC

## 2019-03-05 NOTE — Telephone Encounter (Signed)
-----   Message from Dyann Kief, PA-C sent at 01/28/2019  9:14 AM EST ----- Kidney function up a little. Try to decrease lasix 20 mg daily. Can take and extra 20 mg if needed for leg swelling. Avoid salt. Repeat bmet in 3 weeks.

## 2019-03-05 NOTE — Telephone Encounter (Signed)
The patient's niece (DPR on file) has been notified of the result and recommendations. Niece verbalized understanding.  BMET scheduled for 3/12. All questions (if any) were answered. Lattie Haw, RN 03/05/2019 10:01 AM

## 2019-03-12 ENCOUNTER — Encounter: Payer: Self-pay | Admitting: Podiatry

## 2019-03-12 ENCOUNTER — Other Ambulatory Visit: Payer: Self-pay

## 2019-03-12 ENCOUNTER — Ambulatory Visit (INDEPENDENT_AMBULATORY_CARE_PROVIDER_SITE_OTHER): Payer: Medicaid Other | Admitting: Podiatry

## 2019-03-12 DIAGNOSIS — B351 Tinea unguium: Secondary | ICD-10-CM

## 2019-03-12 DIAGNOSIS — E119 Type 2 diabetes mellitus without complications: Secondary | ICD-10-CM | POA: Diagnosis not present

## 2019-03-12 DIAGNOSIS — M79674 Pain in right toe(s): Secondary | ICD-10-CM | POA: Diagnosis not present

## 2019-03-12 DIAGNOSIS — M79675 Pain in left toe(s): Secondary | ICD-10-CM | POA: Diagnosis not present

## 2019-03-12 NOTE — Patient Instructions (Signed)
Diabetes Mellitus and Foot Care Foot care is an important part of your health, especially when you have diabetes. Diabetes may cause you to have problems because of poor blood flow (circulation) to your feet and legs, which can cause your skin to:  Become thinner and drier.  Break more easily.  Heal more slowly.  Peel and crack. You may also have nerve damage (neuropathy) in your legs and feet, causing decreased feeling in them. This means that you may not notice minor injuries to your feet that could lead to more serious problems. Noticing and addressing any potential problems early is the best way to prevent future foot problems. How to care for your feet Foot hygiene  Wash your feet daily with warm water and mild soap. Do not use hot water. Then, pat your feet and the areas between your toes until they are completely dry. Do not soak your feet as this can dry your skin.  Trim your toenails straight across. Do not dig under them or around the cuticle. File the edges of your nails with an emery board or nail file.  Apply a moisturizing lotion or petroleum jelly to the skin on your feet and to dry, brittle toenails. Use lotion that does not contain alcohol and is unscented. Do not apply lotion between your toes. Shoes and socks  Wear clean socks or stockings every day. Make sure they are not too tight. Do not wear knee-high stockings since they may decrease blood flow to your legs.  Wear shoes that fit properly and have enough cushioning. Always look in your shoes before you put them on to be sure there are no objects inside.  To break in new shoes, wear them for just a few hours a day. This prevents injuries on your feet. Wounds, scrapes, corns, and calluses  Check your feet daily for blisters, cuts, bruises, sores, and redness. If you cannot see the bottom of your feet, use a mirror or ask someone for help.  Do not cut corns or calluses or try to remove them with medicine.  If you  find a minor scrape, cut, or break in the skin on your feet, keep it and the skin around it clean and dry. You may clean these areas with mild soap and water. Do not clean the area with peroxide, alcohol, or iodine.  If you have a wound, scrape, corn, or callus on your foot, look at it several times a day to make sure it is healing and not infected. Check for: ? Redness, swelling, or pain. ? Fluid or blood. ? Warmth. ? Pus or a bad smell. General instructions  Do not cross your legs. This may decrease blood flow to your feet.  Do not use heating pads or hot water bottles on your feet. They may burn your skin. If you have lost feeling in your feet or legs, you may not know this is happening until it is too late.  Protect your feet from hot and cold by wearing shoes, such as at the beach or on hot pavement.  Schedule a complete foot exam at least once a year (annually) or more often if you have foot problems. If you have foot problems, report any cuts, sores, or bruises to your health care provider immediately. Contact a health care provider if:  You have a medical condition that increases your risk of infection and you have any cuts, sores, or bruises on your feet.  You have an injury that is not   healing.  You have redness on your legs or feet.  You feel burning or tingling in your legs or feet.  You have pain or cramps in your legs and feet.  Your legs or feet are numb.  Your feet always feel cold.  You have pain around a toenail. Get help right away if:  You have a wound, scrape, corn, or callus on your foot and: ? You have pain, swelling, or redness that gets worse. ? You have fluid or blood coming from the wound, scrape, corn, or callus. ? Your wound, scrape, corn, or callus feels warm to the touch. ? You have pus or a bad smell coming from the wound, scrape, corn, or callus. ? You have a fever. ? You have a red line going up your leg. Summary  Check your feet every day  for cuts, sores, red spots, swelling, and blisters.  Moisturize feet and legs daily.  Wear shoes that fit properly and have enough cushioning.  If you have foot problems, report any cuts, sores, or bruises to your health care provider immediately.  Schedule a complete foot exam at least once a year (annually) or more often if you have foot problems. This information is not intended to replace advice given to you by your health care provider. Make sure you discuss any questions you have with your health care provider. Document Revised: 09/16/2018 Document Reviewed: 01/26/2016 Elsevier Patient Education  2020 [employer] Inc.  

## 2019-03-15 ENCOUNTER — Other Ambulatory Visit: Payer: Self-pay | Admitting: *Deleted

## 2019-03-15 MED ORDER — APIXABAN 5 MG PO TABS: 5.0000 mg | ORAL_TABLET | Freq: Two times a day (BID) | ORAL | 10 refills | Status: DC

## 2019-03-15 NOTE — Telephone Encounter (Signed)
Eliquis 5mg  refill request received, pt is 64 yrs old, weight-69kg, Crea-1.29 on 01/27/2019, Diagnosis-Atrial flutter, and last seen by 01/29/2019 on 01/27/2019. Dose is appropriate based on dosing criteria. Will send in refill to requested pharmacy.

## 2019-03-17 NOTE — Progress Notes (Signed)
Subjective: [name] presents today for follow up of preventative diabetic foot care and painful mycotic nails b/l that are difficult to trim. Pain interferes with ambulation. Aggravating factors include wearing enclosed shoe gear. Pain is relieved with periodic professional debridement.   No Known Allergies   Objective: There were no vitals filed for this visit.  Pt 64 y.o. year old male  in NAD. AAO x 3.   Vascular Examination:  Capillary refill time to digits immediate b/l. Palpable DP pulses b/l. Palpable PT pulses b/l. Pedal hair absent b/l Skin temperature gradient within normal limits b/l.  Dermatological Examination: Pedal skin with normal turgor, texture and tone bilaterally. No open wounds bilaterally. No interdigital macerations bilaterally. Toenails 1-5 b/l elongated, dystrophic, thickened, crumbly with subungual debris and tenderness to dorsal palpation.  Musculoskeletal: Normal muscle strength 5/5 to all lower extremity muscle groups bilaterally, bunion deformity noted b/l and hammertoes noted to the  2-5 bilaterally  Neurological: Protective sensation intact 5/5 intact bilaterally with 10g monofilament b/l Vibratory sensation intact b/l  Assessment: 1. Pain due to onychomycosis of toenails of both feet   2. Controlled type 2 diabetes mellitus without complication, without long-term current use of insulin (HCC)    Plan: -Continue diabetic foot care principles. Literature dispensed on today.  -Toenails 1-5 b/l were debrided in length and girth with sterile nail nippers and dremel without iatrogenic bleeding.  -Patient to continue soft, supportive shoe gear daily. -Patient to report any pedal injuries to medical professional immediately. -Patient/POA to call should there be question/concern in the interim.  Return in about 3 months (around 06/12/2019) for diabetic nail trim.

## 2019-03-19 ENCOUNTER — Other Ambulatory Visit: Payer: Self-pay

## 2019-03-19 ENCOUNTER — Other Ambulatory Visit: Payer: Medicaid Other | Admitting: *Deleted

## 2019-03-19 DIAGNOSIS — I5022 Chronic systolic (congestive) heart failure: Secondary | ICD-10-CM

## 2019-03-19 LAB — BASIC METABOLIC PANEL
BUN/Creatinine Ratio: 11 (ref 10–24)
BUN: 12 mg/dL (ref 8–27)
CO2: 19 mmol/L — ABNORMAL LOW (ref 20–29)
Calcium: 9.4 mg/dL (ref 8.6–10.2)
Chloride: 100 mmol/L (ref 96–106)
Creatinine, Ser: 1.09 mg/dL (ref 0.76–1.27)
GFR calc Af Amer: 83 mL/min/{1.73_m2} (ref >59)
GFR calc non Af Amer: 72 mL/min/{1.73_m2} (ref >59)
Glucose: 102 mg/dL — ABNORMAL HIGH (ref 65–99)
Potassium: 4.2 mmol/L (ref 3.5–5.2)
Sodium: 137 mmol/L (ref 134–144)

## 2019-03-24 ENCOUNTER — Telehealth: Payer: Self-pay | Admitting: Physician Assistant

## 2019-03-24 NOTE — Telephone Encounter (Signed)
New message   Patient's niece is returning call for lab results. Please call

## 2019-03-24 NOTE — Telephone Encounter (Signed)
[name] I, RN  03/24/2019 9:55 AM EDT    The patient's niece has been notified of the result and verbalized understanding. Patient feels okay with lower dose of lasix. Denies increased swelling or SOB. All questions (if any) were answered. Lattie Haw, RN 03/24/2019 9:53 AM            Dyann Kief, PA-C  03/22/2019 10:06 AM EDT    Kidney function better. Ask how they are doing on reduced lasix? thanks

## 2019-03-29 ENCOUNTER — Other Ambulatory Visit: Payer: Self-pay | Admitting: Cardiovascular Disease

## 2019-03-29 MED ORDER — ENTRESTO 49-51 MG PO TABS: 1.0000 | ORAL_TABLET | Freq: Two times a day (BID) | ORAL | 2 refills | Status: DC

## 2019-04-19 ENCOUNTER — Telehealth: Payer: Self-pay | Admitting: Cardiovascular Disease

## 2019-04-19 NOTE — Telephone Encounter (Signed)
Spoke with patient's niece Pt instructed after 1/20 labs to try to decrease lasix to 20 mg, watch salt and take additional 20 mg if needed. Repeat lab results 3/12 - pt had no change in swelling or SOB on reduced dose. Now his neices (DPR) calling with right lower extremity swelling and change to his breathing with activity.  SOB after 10-15 steps.  She has been giving him the additional 20 mg lasix daily for the last week and half. He is weighed at home but she is not sure of changes.   Discussed diet/sodium.  Meals are made without sodium but he may be getting salty snacks.  Will look into that.  Also she has just gotten compression stockings for him.  Pt has been scheduled 4/14 with Dr. [name].  Pt's niece appreciative for this appointment and will be attending with him.

## 2019-04-19 NOTE — Telephone Encounter (Signed)
New message   Pt c/o swelling: STAT is pt has developed SOB within 24 hours  1) How much weight have you gained and in what time span? No   2) If swelling, where is the swelling located? Right leg   3) Are you currently taking a fluid pill? Yes   4) Are you currently SOB?yes   5) Do you have a log of your daily weights (if so, list)? Yes   6) Have you gained 3 pounds in a day or 5 pounds in a week? No   7) Have you traveled recently?no

## 2019-04-21 ENCOUNTER — Ambulatory Visit: Payer: Medicaid Other | Admitting: Cardiovascular Disease

## 2019-04-21 ENCOUNTER — Encounter: Payer: Self-pay | Admitting: Cardiovascular Disease

## 2019-04-21 ENCOUNTER — Other Ambulatory Visit: Payer: Self-pay

## 2019-04-21 VITALS — BP 100/80 | HR 55 | Ht 72.0 in | Wt 144.4 lb

## 2019-04-21 DIAGNOSIS — I4892 Unspecified atrial flutter: Secondary | ICD-10-CM | POA: Diagnosis not present

## 2019-04-21 DIAGNOSIS — I5022 Chronic systolic (congestive) heart failure: Secondary | ICD-10-CM

## 2019-04-21 DIAGNOSIS — E785 Hyperlipidemia, unspecified: Secondary | ICD-10-CM | POA: Diagnosis not present

## 2019-04-21 DIAGNOSIS — I428 Other cardiomyopathies: Secondary | ICD-10-CM

## 2019-04-21 DIAGNOSIS — I2510 Atherosclerotic heart disease of native coronary artery without angina pectoris: Secondary | ICD-10-CM | POA: Diagnosis not present

## 2019-04-21 NOTE — Patient Instructions (Signed)
Medication Instructions:  For 3 days - double lasix --take 80 mg daily for 3 days and then go back to 40 mg daily  *If you need a refill on your cardiac medications before your next appointment, please call your pharmacy*   Lab Work: On the day of echocardiogram --have fasting labs drawn (lipids, liver, bmet)  Testing/Procedures: Your physician has requested that you have an echocardiogram. Echocardiography is a painless test that uses sound waves to create images of your heart. It provides your doctor with information about the size and shape of your heart and how well your heart's chambers and valves are working. This procedure takes approximately one hour. There are no restrictions for this procedure.   Follow-Up: At [facility], you and your health needs are our priority.  As part of our continuing mission to provide you with exceptional heart care, we have created designated Provider Care Teams.  These Care Teams include your primary Cardiologist (physician) and Advanced Practice Providers (APPs -  Physician Assistants and Nurse Practitioners) who all work together to provide you with the care you need, when you need it.  We recommend signing up for the patient portal called "MyChart".  Sign up information is provided on this After Visit Summary.  MyChart is used to connect with patients for Virtual Visits (Telemedicine).  Patients are able to view lab/test results, encounter notes, upcoming appointments, etc.  Non-urgent messages can be sent to your provider as well.   To learn more about what you can do with MyChart, go to [email or web address].    Your next appointment:   6 month(s)  The format for your next appointment:   In Person  Provider:   You may see Verne Carrow, MD or one of the following Advanced Practice Providers on your designated Care Team:    Ronie Spies, PA-C  Jacolyn Reedy, PA-C    Other Instructions

## 2019-04-21 NOTE — Progress Notes (Signed)
Chief Complaint  Patient presents with  . Follow-up    Atrial Flutter   History of Present Illness: 64 yo male with history of CVA in 2010, expressive aphasia, chronic right sided weakness, HTN, DM, non-ischemic cardiomyopathy, chronic systolic CHF and atrial flutter who is here today for cardiac follow up. He was admitted to [facility] 09/04/16 with atrial flutter and was found to have LVEF=20%. Cardiac cath 09/06/16 with mild non-obstructive CAD. He was felt to have a non-ischemic cardiomyopathy. His heart rate was controlled on a low dose beta blocker. He was started on Eliquis. He was also started on Entresto. DCCV at [facility] 10/10/16. LVEF improved to 45-50% by echo November 2018. He was seen in our office 07/15/17 and was back in atrial flutter with heart rate in the 50s.  He was admitted to [facility] 07/31/17 with CHF and diuresed quickly with IV lasix. Home dosage of Lasix was increased to 40 mg daily.   He is here today for follow up. The patient denies any chest pain, dyspnea, palpitations, lower extremity edema, orthopnea, PND, dizziness, near syncope or syncope.   Primary Care Physician: Iona Beard, MD  Past Medical History:  Diagnosis Date  . Atrial flutter ([place])   . Diabetes mellitus   . Hypertension   . NICM (nonischemic cardiomyopathy) ([place]) 08/2016   Mild, nonobstructive CAD at cath, EF initially 20%, improved to 50% 11/2016 echo  . Stroke [facility])     Past Surgical History:  Procedure Laterality Date  . CARDIOVERSION N/A 10/10/2016   Procedure: CARDIOVERSION;  Surgeon: Lelon Perla, MD;  Location: [facility] ENDOSCOPY;  Service: Cardiovascular;  Laterality: N/A;  . RIGHT/LEFT HEART CATH AND CORONARY ANGIOGRAPHY N/A 09/06/2016   Procedure: RIGHT/LEFT HEART CATH AND CORONARY ANGIOGRAPHY;  Surgeon: Troy Sine, MD;  Location: [facility] CV LAB;  Service: Cardiovascular;  Laterality: N/A;  . TRACHEOSTOMY      Current Outpatient Medications  Medication Sig Dispense Refill  . acetaminophen  (TYLENOL) 500 MG tablet Take 1,000 mg by mouth every 6 (six) hours as needed for pain.    Marland Kitchen apixaban (ELIQUIS) 5 MG TABS tablet Take 1 tablet (5 mg total) by mouth 2 (two) times daily. 60 tablet 10  . aspirin EC 81 MG EC tablet Take 1 tablet (81 mg total) by mouth daily.    . carvedilol (COREG) 3.125 MG tablet Take 1 tablet (3.125 mg total) by mouth 2 (two) times daily with a meal. 180 tablet 1  . furosemide (LASIX) 40 MG tablet Take 40 mg by mouth daily.    Marland Kitchen glipiZIDE (GLUCOTROL) 10 MG tablet Take 1 tablet (10 mg total) by mouth daily with breakfast. 30 tablet 6  . loratadine (CLARITIN) 10 MG tablet TK 1 T PO D    . metFORMIN (GLUCOPHAGE) 1000 MG tablet Take 1,000 mg by mouth 2 (two) times daily.    . Multiple Vitamin (MULTIVITAMIN WITH MINERALS) TABS Take 1 tablet by mouth daily.    Marland Kitchen omega-3 acid ethyl esters (LOVAZA) 1 G capsule Take 1 g by mouth daily.    . sacubitril-valsartan (ENTRESTO) 49-51 MG Take 1 tablet by mouth 2 (two) times daily. 180 tablet 2  . simvastatin (ZOCOR) 40 MG tablet Take 40 mg by mouth daily.     No current facility-administered medications for this visit.    No Known Allergies  Social History   Socioeconomic History  . Marital status: Single    Spouse name: Not on file  . Number of children: Not  on file  . Years of education: Not on file  . Highest education level: Not on file  Occupational History  . Not on file  Tobacco Use  . Smoking status: Never Smoker  . Smokeless tobacco: Never Used  Substance and Sexual Activity  . Alcohol use: No  . Drug use: No  . Sexual activity: Not Currently  Other Topics Concern  . Not on file  Social History Narrative  . Not on file   Social Determinants of Health   Financial Resource Strain:   . Difficulty of Paying Living Expenses:   Food Insecurity:   . Worried About Programme researcher, broadcasting/film/video in the Last Year:   . Barista in the Last Year:   Transportation Needs:   . Freight forwarder (Medical):     Marland Kitchen Lack of Transportation (Non-Medical):   Physical Activity:   . Days of Exercise per Week:   . Minutes of Exercise per Session:   Stress:   . Feeling of Stress :   Social Connections:   . Frequency of Communication with Friends and Family:   . Frequency of Social Gatherings with Friends and Family:   . Attends Religious Services:   . Active Member of Clubs or Organizations:   . Attends Banker Meetings:   Marland Kitchen Marital Status:   Intimate Partner Violence:   . Fear of Current or Ex-Partner:   . Emotionally Abused:   Marland Kitchen Physically Abused:   . Sexually Abused:     Family History  Problem Relation Age of Onset  . Hypertension Mother   . Diabetes Mother   . Heart disease Father     Review of Systems:  As stated in the HPI and otherwise negative.   BP 100/80   Pulse (!) 55   Ht 6' (1.829 m)   Wt 144 lb 6.4 oz (65.5 kg)   SpO2 99%   BMI 19.58 kg/m   Physical Examination:  General: Well developed, well nourished, NAD  HEENT: OP clear, mucus membranes moist  SKIN: warm, dry. No rashes. Neuro: No focal deficits  Musculoskeletal: Muscle strength 5/5 all ext  Psychiatric: Mood and affect normal  Neck: No JVD, no carotid bruits, no thyromegaly, no lymphadenopathy.  Lungs:Clear bilaterally, no wheezes, rhonci, crackles Cardiovascular: Regular rate and rhythm. No murmurs, gallops or rubs. Abdomen:Soft. Bowel sounds present. Non-tender.  Extremities: No lower extremity edema. Pulses are 2 + in the bilateral DP/PT.  Echo November 2018: - Left ventricle: The cavity size was normal. Wall thickness was   increased in a pattern of severe LVH. Systolic function was   mildly reduced. The estimated ejection fraction was in the range   of 45% to 50%. Diffuse hypokinesis. Doppler parameters are   consistent with abnormal left ventricular relaxation (grade 1   diastolic dysfunction). The E/e&' ratio is between 8-15,   suggesting indeterminate LV filling pressure. - Left  atrium: The atrium was normal in size. - Tricuspid valve: There was mild regurgitation. - Pulmonary arteries: PA peak pressure: 29 mm Hg (S).   Cardiac cath 09/06/16: Diagnostic Diagram        EKG:  EKG is  not ordered today. The ekg ordered today demonstrates   Recent Labs: 01/27/2019: Hemoglobin 17.2; Platelets 224 03/19/2019: BUN 12; Creatinine, Ser 1.09; Potassium 4.2; Sodium 137   Lipid Panel    Component Value Date/Time   CHOL 121 07/07/2007 2028   TRIG 140 07/07/2007 2028   HDL 31 (L) 07/07/2007  2028   CHOLHDL 3.9 Ratio 07/07/2007 2028   VLDL 28 07/07/2007 2028   LDLCALC 62 07/07/2007 2028     Wt Readings from Last 3 Encounters:  04/21/19 144 lb 6.4 oz (65.5 kg)  01/27/19 152 lb 3.2 oz (69 kg)  01/15/18 159 lb 6.4 oz (72.3 kg)     Other studies Reviewed: Additional studies/ records that were reviewed today include: . Review of the above records demonstrates:   Assessment and Plan:   1. Atrial fib/flutter: Irregular on exam today. Rate controlled. Continue Coreg and Eliquis.     2. Non-ischemic cardiomyopathy: LVEF is now 45-50%. NYHA class 2.  Continue Entresto and Coreg.   3. HLD: LDL near goal in 2019. Will check lipids and LFTs now. Continue statin.   4. CAD without angina: He has no chest pain. Continue ASA, beta blocker and statin.    5. Chronic systolic CHF: Weight is stable but he has mild right LE edema. Some dyspnea. Will arrange echo. Will increase Lasix to 80 mg daily for 3 days.   Current medicines are reviewed at length with the patient today.  The patient does not have concerns regarding medicines.  The following changes have been made:  no change  Labs/ tests ordered today include:   Orders Placed This Encounter  Procedures  . Hepatic function panel  . Lipid panel  . Basic metabolic panel  . ECHOCARDIOGRAM COMPLETE     Disposition:   Follow up with me in 6 months   Signed, Verne Carrow, MD 04/21/2019 3:12 PM    [facility] [address] [place], [place], [place]  [address] Phone: [phone]; Fax: [phone]

## 2019-05-07 ENCOUNTER — Other Ambulatory Visit: Payer: Medicaid Other | Admitting: *Deleted

## 2019-05-07 ENCOUNTER — Ambulatory Visit (HOSPITAL_COMMUNITY): Payer: Medicaid Other | Attending: Internal Medicine

## 2019-05-07 ENCOUNTER — Other Ambulatory Visit: Payer: Self-pay

## 2019-05-07 DIAGNOSIS — E785 Hyperlipidemia, unspecified: Secondary | ICD-10-CM

## 2019-05-07 DIAGNOSIS — I428 Other cardiomyopathies: Secondary | ICD-10-CM

## 2019-05-07 DIAGNOSIS — I5022 Chronic systolic (congestive) heart failure: Secondary | ICD-10-CM | POA: Insufficient documentation

## 2019-05-07 DIAGNOSIS — I4892 Unspecified atrial flutter: Secondary | ICD-10-CM | POA: Diagnosis not present

## 2019-05-07 DIAGNOSIS — I2510 Atherosclerotic heart disease of native coronary artery without angina pectoris: Secondary | ICD-10-CM

## 2019-05-07 LAB — HEPATIC FUNCTION PANEL
ALT: 13 IU/L (ref 0–44)
AST: 32 IU/L (ref 0–40)
Albumin: 4.2 g/dL (ref 3.8–4.8)
Alkaline Phosphatase: 162 IU/L — ABNORMAL HIGH (ref 39–117)
Bilirubin Total: 1.6 mg/dL — ABNORMAL HIGH (ref 0.0–1.2)
Bilirubin, Direct: 0.78 mg/dL — ABNORMAL HIGH (ref 0.00–0.40)
Total Protein: 6.9 g/dL (ref 6.0–8.5)

## 2019-05-07 LAB — BASIC METABOLIC PANEL
BUN/Creatinine Ratio: 11 (ref 10–24)
BUN: 12 mg/dL (ref 8–27)
CO2: 21 mmol/L (ref 20–29)
Calcium: 9.4 mg/dL (ref 8.6–10.2)
Chloride: 103 mmol/L (ref 96–106)
Creatinine, Ser: 1.13 mg/dL (ref 0.76–1.27)
GFR calc Af Amer: 80 mL/min/{1.73_m2} (ref >59)
GFR calc non Af Amer: 69 mL/min/{1.73_m2} (ref >59)
Glucose: 97 mg/dL (ref 65–99)
Potassium: 4.6 mmol/L (ref 3.5–5.2)
Sodium: 140 mmol/L (ref 134–144)

## 2019-05-07 LAB — LIPID PANEL
Chol/HDL Ratio: 3.2 ratio (ref 0.0–5.0)
Cholesterol, Total: 93 mg/dL — ABNORMAL LOW (ref 100–199)
HDL: 29 mg/dL — ABNORMAL LOW (ref >39)
LDL Chol Calc (NIH): 50 mg/dL (ref 0–99)
Triglycerides: 59 mg/dL (ref 0–149)
VLDL Cholesterol Cal: 14 mg/dL (ref 5–40)

## 2019-05-12 ENCOUNTER — Other Ambulatory Visit: Payer: Self-pay | Admitting: *Deleted

## 2019-05-12 DIAGNOSIS — R945 Abnormal results of liver function studies: Secondary | ICD-10-CM

## 2019-05-12 DIAGNOSIS — R7989 Other specified abnormal findings of blood chemistry: Secondary | ICD-10-CM

## 2019-05-14 ENCOUNTER — Other Ambulatory Visit: Payer: Self-pay

## 2019-05-14 MED ORDER — CARVEDILOL 3.125 MG PO TABS: 3.1250 mg | ORAL_TABLET | Freq: Two times a day (BID) | ORAL | 3 refills | Status: DC

## 2019-06-09 ENCOUNTER — Other Ambulatory Visit: Payer: Medicaid Other | Admitting: *Deleted

## 2019-06-09 ENCOUNTER — Other Ambulatory Visit: Payer: Self-pay

## 2019-06-09 DIAGNOSIS — R7989 Other specified abnormal findings of blood chemistry: Secondary | ICD-10-CM

## 2019-06-09 LAB — HEPATIC FUNCTION PANEL
ALT: 15 IU/L (ref 0–44)
AST: 37 IU/L (ref 0–40)
Albumin: 4.5 g/dL (ref 3.8–4.8)
Alkaline Phosphatase: 177 IU/L — ABNORMAL HIGH (ref 48–121)
Bilirubin Total: 2 mg/dL — ABNORMAL HIGH (ref 0.0–1.2)
Bilirubin, Direct: 0.98 mg/dL — ABNORMAL HIGH (ref 0.00–0.40)
Total Protein: 7.3 g/dL (ref 6.0–8.5)

## 2019-06-14 ENCOUNTER — Ambulatory Visit (INDEPENDENT_AMBULATORY_CARE_PROVIDER_SITE_OTHER): Payer: Medicaid Other | Admitting: Podiatry

## 2019-06-14 DIAGNOSIS — Z5329 Procedure and treatment not carried out because of patient's decision for other reasons: Secondary | ICD-10-CM

## 2019-06-14 NOTE — Progress Notes (Signed)
   Complete physical exam  Patient: [name]   DOB: 10/27/1998   64 y.o. Male  MRN: [id number]  Subjective:    No chief complaint on file.   [name] is a 64 y.o. male who presents today for a complete physical exam. She reports consuming a {diet types:17450} diet. {types:19826} She generally feels {DESC; WELL/FAIRLY WELL/POORLY:18703}. She reports sleeping {DESC; WELL/FAIRLY WELL/POORLY:18703}. She {does/does not:200015} have additional problems to discuss today.    Most recent fall risk assessment:    07/04/2021   10:42 AM  Fall Risk   Falls in the past year? 0  Number falls in past yr: 0  Injury with Fall? 0  Risk for fall due to : No Fall Risks  Follow up Falls evaluation completed     Most recent depression screenings:    07/04/2021   10:42 AM 05/25/2020   10:46 AM  PHQ 2/9 Scores  PHQ - 2 Score 0 0  PHQ- 9 Score 5     {VISON DENTAL STD PSA (Optional):27386}  {History (Optional):23778}  Patient Care Team: Jessup, Joy, NP as PCP - General (Nurse Practitioner)   Outpatient Medications Prior to Visit  Medication Sig   fluticasone (FLONASE) 50 MCG/ACT nasal spray Place 2 sprays into both nostrils in the morning and at bedtime. After 7 days, reduce to once daily.   norgestimate-ethinyl estradiol (SPRINTEC 28) 0.25-35 MG-MCG tablet Take 1 tablet by mouth daily.   Nystatin POWD Apply liberally to affected area 2 times per day   spironolactone (ALDACTONE) 100 MG tablet Take 1 tablet (100 mg total) by mouth daily.   No facility-administered medications prior to visit.    ROS        Objective:     There were no vitals taken for this visit. {Vitals History (Optional):23777}  Physical Exam   No results found for any visits on 08/09/21. {Show previous labs (optional):23779}    Assessment & Plan:    Routine Health Maintenance and Physical Exam  Immunization History  Administered Date(s) Administered   DTaP 01/10/1999, 03/08/1999,  05/17/1999, 01/31/2000, 08/16/2003   Hepatitis A 06/12/2007, 06/17/2008   Hepatitis B 10/28/1998, 12/05/1998, 05/17/1999   HiB (PRP-OMP) 01/10/1999, 03/08/1999, 05/17/1999, 01/31/2000   IPV 01/10/1999, 03/08/1999, 11/05/1999, 08/16/2003   Influenza,inj,Quad PF,6+ Mos 09/17/2013   Influenza-Unspecified 12/18/2011   MMR 11/04/2000, 08/16/2003   Meningococcal Polysaccharide 06/17/2011   Pneumococcal Conjugate-13 01/31/2000   Pneumococcal-Unspecified 05/17/1999, 07/31/1999   Tdap 06/17/2011   Varicella 11/05/1999, 06/12/2007    Health Maintenance  Topic Date Due   HIV Screening  Never done   Hepatitis C Screening  Never done   INFLUENZA VACCINE  08/07/2021   PAP-Cervical Cytology Screening  08/09/2021 (Originally 10/27/2019)   PAP SMEAR-Modifier  08/09/2021 (Originally 10/27/2019)   TETANUS/TDAP  08/09/2021 (Originally 06/16/2021)   HPV VACCINES  Discontinued   COVID-19 Vaccine  Discontinued    Discussed health benefits of physical activity, and encouraged her to engage in regular exercise appropriate for her age and condition.  Problem List Items Addressed This Visit   None Visit Diagnoses     Annual physical exam    -  Primary   Cervical cancer screening       Need for Tdap vaccination          No follow-ups on file.     Joy Jessup, NP   

## 2019-06-18 ENCOUNTER — Telehealth: Payer: Self-pay | Admitting: *Deleted

## 2019-06-18 NOTE — Telephone Encounter (Signed)
-----   Message from [facility] sent at 06/09/2019  2:08 PM EDT ----- Regarding: Patient request Hey this patient was seen for labs today but his niece [name] would like to speak to someone about the steps for getting her uncle a scooter chair/mobile chair? Is there anyway you can give her a call about what needs to happen or what steps she can take? Her # is [phone]. Thanks in advance!

## 2019-06-18 NOTE — Telephone Encounter (Signed)
I called and spoke with patient's niece.  She is aware to reach out to patient's primary care physician for assistance.

## 2019-07-05 ENCOUNTER — Telehealth: Payer: Self-pay | Admitting: Cardiovascular Disease

## 2019-07-05 NOTE — Telephone Encounter (Signed)
Patient's HR is controlled, 50s.  Will update his niece w this information when she calls back later in week.

## 2019-07-05 NOTE — Telephone Encounter (Signed)
Agree with increasing Lasix. As long as his heart rate is controlled, would not push forward with cardioversion right now. Thayer Ohm

## 2019-07-05 NOTE — Telephone Encounter (Signed)
Spoke with pt's niece, Vilinda Boehringer, caretaker who confirms pt has had a 5 pound weight gain over the last 2 days with increase SOB on exertion. Bilateral ankle swelling present with some possible abdominal swelling as well. Pt has been taking medications as prescribed.  Pt's niece is requesting pt be seen due to symptoms.  Will forward information to Dr Weldon Picking RN for review.  Pt's niece verbalizes understanding and agrees with current plan.

## 2019-07-05 NOTE — Telephone Encounter (Signed)
Spoke with patient's niece. Patient is more SOB on exertion than normal. Weight up 5 pounds over last few days. Bilateral ankles swollen.  Typically tends to be just his stroke affected side that swells.  Reviewed last ov patient was instructed to increase lasix to 80 mg daily for 3 days.  I instructed his niece increase lasix to 80 mg x 3 days. To start tomorrow am. She has been away. His brother was caring for him.  He probably ate out more than normal.  She said at last ov they discussed DCCV and wants to know if they need to discuss this prior to next appointment.  His f/u is scheduled for October.  She is aware I am forwarding to Dr. [name] for review and if there are new recommendations I will call her back.  Otherwise she is aware to call back at end of week w how pt is doing.  Aware that if he gets worse at any time this week he should be seen at ER.  She is in agreement w this plan.

## 2019-07-05 NOTE — Telephone Encounter (Signed)
Pt c/o swelling: STAT is pt has developed SOB within 24 hours  1) How much weight have you gained and in what time span? 5 lbs in two days   2) If swelling, where is the swelling located? Ankles   3) Are you currently taking a fluid pill? Yes   4) Are you currently SOB? Has been experiencing SOB, but not having right now.   5) Do you have a log of your daily weights (if so, list)? No   6) Have you gained 3 pounds in a day or 5 pounds in a week? 5 lbs in 2 days   7) Have you traveled recently? No   Patient has been experiencing SOB for the past 3 weeks, but it has gotten noticeably worse in the past week.  [name] is requesting an appointment asap. Please advise.

## 2019-07-12 ENCOUNTER — Inpatient Hospital Stay
Admission: EM | Admit: 2019-07-12 | Discharge: 2019-07-19 | DRG: 292 | Disposition: A | Discharge status: Home Health | Payer: Medicaid Other | Attending: Internal Medicine | Admitting: Internal Medicine

## 2019-07-12 ENCOUNTER — Emergency Department: Payer: Medicaid Other

## 2019-07-12 ENCOUNTER — Other Ambulatory Visit: Payer: Self-pay

## 2019-07-12 DIAGNOSIS — I5023 Acute on chronic systolic (congestive) heart failure: Secondary | ICD-10-CM

## 2019-07-12 DIAGNOSIS — I69353 Hemiplegia and hemiparesis following cerebral infarction affecting right non-dominant side: Secondary | ICD-10-CM

## 2019-07-12 DIAGNOSIS — R531 Weakness: Secondary | ICD-10-CM | POA: Diagnosis present

## 2019-07-12 DIAGNOSIS — I4510 Unspecified right bundle-branch block: Secondary | ICD-10-CM | POA: Diagnosis present

## 2019-07-12 DIAGNOSIS — I4821 Permanent atrial fibrillation: Secondary | ICD-10-CM

## 2019-07-12 DIAGNOSIS — I69320 Aphasia following cerebral infarction: Secondary | ICD-10-CM

## 2019-07-12 DIAGNOSIS — I4892 Unspecified atrial flutter: Secondary | ICD-10-CM | POA: Diagnosis present

## 2019-07-12 DIAGNOSIS — Z79899 Other long term (current) drug therapy: Secondary | ICD-10-CM

## 2019-07-12 DIAGNOSIS — E1165 Type 2 diabetes mellitus with hyperglycemia: Secondary | ICD-10-CM | POA: Diagnosis present

## 2019-07-12 DIAGNOSIS — I2510 Atherosclerotic heart disease of native coronary artery without angina pectoris: Secondary | ICD-10-CM | POA: Diagnosis present

## 2019-07-12 DIAGNOSIS — R609 Edema, unspecified: Secondary | ICD-10-CM

## 2019-07-12 DIAGNOSIS — E876 Hypokalemia: Secondary | ICD-10-CM | POA: Diagnosis not present

## 2019-07-12 DIAGNOSIS — Z8249 Family history of ischemic heart disease and other diseases of the circulatory system: Secondary | ICD-10-CM

## 2019-07-12 DIAGNOSIS — Z7982 Long term (current) use of aspirin: Secondary | ICD-10-CM

## 2019-07-12 DIAGNOSIS — R140 Abdominal distension (gaseous): Secondary | ICD-10-CM | POA: Diagnosis present

## 2019-07-12 DIAGNOSIS — Z7984 Long term (current) use of oral hypoglycemic drugs: Secondary | ICD-10-CM

## 2019-07-12 DIAGNOSIS — Z87891 Personal history of nicotine dependence: Secondary | ICD-10-CM

## 2019-07-12 DIAGNOSIS — Z7901 Long term (current) use of anticoagulants: Secondary | ICD-10-CM

## 2019-07-12 DIAGNOSIS — R0602 Shortness of breath: Secondary | ICD-10-CM

## 2019-07-12 DIAGNOSIS — I110 Hypertensive heart disease with heart failure: Principal | ICD-10-CM | POA: Diagnosis present

## 2019-07-12 DIAGNOSIS — J9811 Atelectasis: Secondary | ICD-10-CM | POA: Diagnosis present

## 2019-07-12 DIAGNOSIS — I428 Other cardiomyopathies: Secondary | ICD-10-CM

## 2019-07-12 DIAGNOSIS — K760 Fatty (change of) liver, not elsewhere classified: Secondary | ICD-10-CM | POA: Diagnosis present

## 2019-07-12 DIAGNOSIS — I959 Hypotension, unspecified: Secondary | ICD-10-CM | POA: Diagnosis present

## 2019-07-12 DIAGNOSIS — E785 Hyperlipidemia, unspecified: Secondary | ICD-10-CM | POA: Diagnosis present

## 2019-07-12 DIAGNOSIS — R188 Other ascites: Secondary | ICD-10-CM | POA: Diagnosis present

## 2019-07-12 DIAGNOSIS — Z20822 Contact with and (suspected) exposure to covid-19: Secondary | ICD-10-CM | POA: Diagnosis present

## 2019-07-12 DIAGNOSIS — Z833 Family history of diabetes mellitus: Secondary | ICD-10-CM

## 2019-07-12 DIAGNOSIS — I509 Heart failure, unspecified: Secondary | ICD-10-CM

## 2019-07-12 DIAGNOSIS — I472 Ventricular tachycardia: Secondary | ICD-10-CM | POA: Diagnosis present

## 2019-07-12 HISTORY — DX: Heart failure, unspecified: I50.9

## 2019-07-12 LAB — CBC
HCT: 36.7 % — ABNORMAL LOW (ref 39.0–52.0)
Hemoglobin: 12.5 g/dL — ABNORMAL LOW (ref 13.0–17.0)
MCH: 30.9 pg (ref 26.0–34.0)
MCHC: 34.1 g/dL (ref 30.0–36.0)
MCV: 90.6 fL (ref 80.0–100.0)
Platelets: 172 10*3/uL (ref 150–400)
RBC: 4.05 MIL/uL — ABNORMAL LOW (ref 4.22–5.81)
RDW: 17.3 % — ABNORMAL HIGH (ref 11.5–15.5)
WBC: 4.8 10*3/uL (ref 4.0–10.5)
nRBC: 0 % (ref 0.0–0.2)

## 2019-07-12 LAB — BASIC METABOLIC PANEL
Anion gap: 11 (ref 5–15)
BUN: 16 mg/dL (ref 8–23)
CO2: 23 mmol/L (ref 22–32)
Calcium: 9.6 mg/dL (ref 8.9–10.3)
Chloride: 101 mmol/L (ref 98–111)
Creatinine, Ser: 1.17 mg/dL (ref 0.61–1.24)
GFR calc Af Amer: >60 mL/min (ref >60)
GFR calc non Af Amer: >60 mL/min (ref >60)
Glucose, Bld: 55 mg/dL — ABNORMAL LOW (ref 70–99)
Potassium: 4.3 mmol/L (ref 3.5–5.1)
Sodium: 135 mmol/L (ref 135–145)

## 2019-07-12 MED ORDER — SODIUM CHLORIDE 0.9% FLUSH
3.0000 mL | Freq: Once | INTRAVENOUS | Status: AC
  Administered 2019-07-13: 3 mL via INTRAVENOUS

## 2019-07-12 MED ORDER — DEXTROSE 50 % IV SOLN
25.0000 mL | Freq: Once | INTRAVENOUS | Status: AC
  Administered 2019-07-13: 25 mL via INTRAVENOUS
  Filled 2019-07-12: qty 50

## 2019-07-12 NOTE — ED Triage Notes (Signed)
Pt comes from accompanied by daughter. Reported that for the last 3 weeks pt has experienced worsening shortness of breath. Pt has been in contact with PCP and was instructed to take x2 doses of lasix last week for 3 days, pt daughter reports no improvement of symptoms at this time.   Today, Pt comes with swelling in the bilateral legs, reports worse in right leg with some aching. Shortness of breath and states that he feels as though he has fluid backed up. No signs of distress is noted at this time.

## 2019-07-13 ENCOUNTER — Encounter: Payer: Self-pay | Admitting: Radiology

## 2019-07-13 ENCOUNTER — Emergency Department: Payer: Medicaid Other

## 2019-07-13 DIAGNOSIS — R531 Weakness: Secondary | ICD-10-CM | POA: Diagnosis present

## 2019-07-13 DIAGNOSIS — I4819 Other persistent atrial fibrillation: Secondary | ICD-10-CM

## 2019-07-13 DIAGNOSIS — E1165 Type 2 diabetes mellitus with hyperglycemia: Secondary | ICD-10-CM | POA: Diagnosis present

## 2019-07-13 DIAGNOSIS — I4892 Unspecified atrial flutter: Secondary | ICD-10-CM | POA: Diagnosis present

## 2019-07-13 DIAGNOSIS — Z7901 Long term (current) use of anticoagulants: Secondary | ICD-10-CM | POA: Diagnosis not present

## 2019-07-13 DIAGNOSIS — I110 Hypertensive heart disease with heart failure: Secondary | ICD-10-CM | POA: Diagnosis not present

## 2019-07-13 DIAGNOSIS — Z7982 Long term (current) use of aspirin: Secondary | ICD-10-CM | POA: Diagnosis not present

## 2019-07-13 DIAGNOSIS — R140 Abdominal distension (gaseous): Secondary | ICD-10-CM | POA: Diagnosis present

## 2019-07-13 DIAGNOSIS — R188 Other ascites: Secondary | ICD-10-CM | POA: Diagnosis present

## 2019-07-13 DIAGNOSIS — I5023 Acute on chronic systolic (congestive) heart failure: Secondary | ICD-10-CM | POA: Diagnosis not present

## 2019-07-13 DIAGNOSIS — I482 Chronic atrial fibrillation, unspecified: Secondary | ICD-10-CM

## 2019-07-13 DIAGNOSIS — D649 Anemia, unspecified: Secondary | ICD-10-CM

## 2019-07-13 DIAGNOSIS — E876 Hypokalemia: Secondary | ICD-10-CM | POA: Diagnosis not present

## 2019-07-13 DIAGNOSIS — R0602 Shortness of breath: Secondary | ICD-10-CM | POA: Diagnosis not present

## 2019-07-13 DIAGNOSIS — I428 Other cardiomyopathies: Secondary | ICD-10-CM | POA: Diagnosis not present

## 2019-07-13 DIAGNOSIS — I69320 Aphasia following cerebral infarction: Secondary | ICD-10-CM | POA: Diagnosis not present

## 2019-07-13 DIAGNOSIS — E11641 Type 2 diabetes mellitus with hypoglycemia with coma: Secondary | ICD-10-CM

## 2019-07-13 DIAGNOSIS — E785 Hyperlipidemia, unspecified: Secondary | ICD-10-CM

## 2019-07-13 DIAGNOSIS — I959 Hypotension, unspecified: Secondary | ICD-10-CM | POA: Diagnosis not present

## 2019-07-13 DIAGNOSIS — Z79899 Other long term (current) drug therapy: Secondary | ICD-10-CM | POA: Diagnosis not present

## 2019-07-13 DIAGNOSIS — Z20822 Contact with and (suspected) exposure to covid-19: Secondary | ICD-10-CM | POA: Diagnosis present

## 2019-07-13 DIAGNOSIS — I472 Ventricular tachycardia: Secondary | ICD-10-CM | POA: Diagnosis present

## 2019-07-13 DIAGNOSIS — I69353 Hemiplegia and hemiparesis following cerebral infarction affecting right non-dominant side: Secondary | ICD-10-CM | POA: Diagnosis not present

## 2019-07-13 DIAGNOSIS — Z833 Family history of diabetes mellitus: Secondary | ICD-10-CM | POA: Diagnosis not present

## 2019-07-13 DIAGNOSIS — Z87891 Personal history of nicotine dependence: Secondary | ICD-10-CM | POA: Diagnosis not present

## 2019-07-13 DIAGNOSIS — Z7984 Long term (current) use of oral hypoglycemic drugs: Secondary | ICD-10-CM | POA: Diagnosis not present

## 2019-07-13 DIAGNOSIS — Z8249 Family history of ischemic heart disease and other diseases of the circulatory system: Secondary | ICD-10-CM | POA: Diagnosis not present

## 2019-07-13 DIAGNOSIS — I509 Heart failure, unspecified: Secondary | ICD-10-CM | POA: Diagnosis not present

## 2019-07-13 DIAGNOSIS — I2510 Atherosclerotic heart disease of native coronary artery without angina pectoris: Secondary | ICD-10-CM | POA: Diagnosis present

## 2019-07-13 DIAGNOSIS — J9811 Atelectasis: Secondary | ICD-10-CM | POA: Diagnosis present

## 2019-07-13 DIAGNOSIS — Z8673 Personal history of transient ischemic attack (TIA), and cerebral infarction without residual deficits: Secondary | ICD-10-CM | POA: Diagnosis not present

## 2019-07-13 DIAGNOSIS — I4510 Unspecified right bundle-branch block: Secondary | ICD-10-CM | POA: Diagnosis present

## 2019-07-13 DIAGNOSIS — I4821 Permanent atrial fibrillation: Secondary | ICD-10-CM | POA: Diagnosis not present

## 2019-07-13 DIAGNOSIS — I420 Dilated cardiomyopathy: Secondary | ICD-10-CM | POA: Diagnosis not present

## 2019-07-13 LAB — TROPONIN I (HIGH SENSITIVITY)
Troponin I (High Sensitivity): 23 ng/L — ABNORMAL HIGH (ref <18)
Troponin I (High Sensitivity): 23 ng/L — ABNORMAL HIGH (ref <18)

## 2019-07-13 LAB — HEPATIC FUNCTION PANEL
ALT: 18 U/L (ref 0–44)
AST: 39 U/L (ref 15–41)
Albumin: 3.8 g/dL (ref 3.5–5.0)
Alkaline Phosphatase: 157 U/L — ABNORMAL HIGH (ref 38–126)
Bilirubin, Direct: 0.9 mg/dL — ABNORMAL HIGH (ref 0.0–0.2)
Indirect Bilirubin: 1.3 mg/dL — ABNORMAL HIGH (ref 0.3–0.9)
Total Bilirubin: 2.2 mg/dL — ABNORMAL HIGH (ref 0.3–1.2)
Total Protein: 7.1 g/dL (ref 6.5–8.1)

## 2019-07-13 LAB — GLUCOSE, CAPILLARY
Glucose-Capillary: 121 mg/dL — ABNORMAL HIGH (ref 70–99)
Glucose-Capillary: 61 mg/dL — ABNORMAL LOW (ref 70–99)
Glucose-Capillary: 71 mg/dL (ref 70–99)
Glucose-Capillary: 99 mg/dL (ref 70–99)

## 2019-07-13 LAB — CBC
HCT: 38.1 % — ABNORMAL LOW (ref 39.0–52.0)
Hemoglobin: 13.2 g/dL (ref 13.0–17.0)
MCH: 30.7 pg (ref 26.0–34.0)
MCHC: 34.6 g/dL (ref 30.0–36.0)
MCV: 88.6 fL (ref 80.0–100.0)
Platelets: 187 10*3/uL (ref 150–400)
RBC: 4.3 MIL/uL (ref 4.22–5.81)
RDW: 17.7 % — ABNORMAL HIGH (ref 11.5–15.5)
WBC: 5.2 10*3/uL (ref 4.0–10.5)
nRBC: 0 % (ref 0.0–0.2)

## 2019-07-13 LAB — BASIC METABOLIC PANEL
Anion gap: 11 (ref 5–15)
BUN: 15 mg/dL (ref 8–23)
CO2: 26 mmol/L (ref 22–32)
Calcium: 9.6 mg/dL (ref 8.9–10.3)
Chloride: 101 mmol/L (ref 98–111)
Creatinine, Ser: 1 mg/dL (ref 0.61–1.24)
GFR calc Af Amer: >60 mL/min (ref >60)
GFR calc non Af Amer: >60 mL/min (ref >60)
Glucose, Bld: 93 mg/dL (ref 70–99)
Potassium: 3.6 mmol/L (ref 3.5–5.1)
Sodium: 138 mmol/L (ref 135–145)

## 2019-07-13 LAB — MAGNESIUM: Magnesium: 1.7 mg/dL (ref 1.7–2.4)

## 2019-07-13 LAB — TSH: TSH: 4.196 u[IU]/mL (ref 0.350–4.500)

## 2019-07-13 LAB — LIPASE, BLOOD: Lipase: 75 U/L — ABNORMAL HIGH (ref 11–51)

## 2019-07-13 LAB — SARS CORONAVIRUS 2 BY RT PCR (HOSPITAL ORDER, PERFORMED IN ~~LOC~~ HOSPITAL LAB): SARS Coronavirus 2: NEGATIVE

## 2019-07-13 LAB — PROTIME-INR
INR: 1.7 — ABNORMAL HIGH (ref 0.8–1.2)
Prothrombin Time: 19.2 seconds — ABNORMAL HIGH (ref 11.4–15.2)

## 2019-07-13 LAB — BRAIN NATRIURETIC PEPTIDE: B Natriuretic Peptide: 1991.9 pg/mL — ABNORMAL HIGH (ref 0.0–100.0)

## 2019-07-13 MED ORDER — SACUBITRIL-VALSARTAN 24-26 MG PO TABS
1.0000 | ORAL_TABLET | Freq: Two times a day (BID) | ORAL | Status: DC
  Administered 2019-07-13 – 2019-07-19 (×12): 1 via ORAL
  Filled 2019-07-13 (×13): qty 1

## 2019-07-13 MED ORDER — ACETAMINOPHEN 325 MG PO TABS
650.0000 mg | ORAL_TABLET | ORAL | Status: DC | PRN
  Administered 2019-07-16: 650 mg via ORAL
  Filled 2019-07-13: qty 2

## 2019-07-13 MED ORDER — SACUBITRIL-VALSARTAN 49-51 MG PO TABS: 1.0000 | ORAL_TABLET | Freq: Two times a day (BID) | ORAL | Status: DC

## 2019-07-13 MED ORDER — SODIUM CHLORIDE 0.9 % IV SOLN: 250.0000 mL | INTRAVENOUS | Status: DC | PRN

## 2019-07-13 MED ORDER — LORATADINE 10 MG PO TABS
10.0000 mg | ORAL_TABLET | Freq: Every day | ORAL | Status: DC
  Administered 2019-07-13 – 2019-07-19 (×7): 10 mg via ORAL
  Filled 2019-07-13 (×7): qty 1

## 2019-07-13 MED ORDER — ALPRAZOLAM 0.25 MG PO TABS
0.2500 mg | ORAL_TABLET | Freq: Two times a day (BID) | ORAL | Status: DC | PRN
  Administered 2019-07-16: 0.25 mg via ORAL
  Filled 2019-07-13: qty 1

## 2019-07-13 MED ORDER — FUROSEMIDE 10 MG/ML IJ SOLN
60.0000 mg | Freq: Once | INTRAMUSCULAR | Status: AC
  Administered 2019-07-13: 60 mg via INTRAVENOUS
  Filled 2019-07-13: qty 8

## 2019-07-13 MED ORDER — ASPIRIN EC 81 MG PO TBEC
81.0000 mg | DELAYED_RELEASE_TABLET | Freq: Every day | ORAL | Status: DC
  Administered 2019-07-13 – 2019-07-19 (×7): 81 mg via ORAL
  Filled 2019-07-13 (×7): qty 1

## 2019-07-13 MED ORDER — SODIUM CHLORIDE 0.9% FLUSH: 3.0000 mL | INTRAVENOUS | Status: DC | PRN

## 2019-07-13 MED ORDER — ADULT MULTIVITAMIN W/MINERALS CH
1.0000 | ORAL_TABLET | Freq: Every day | ORAL | Status: DC
  Administered 2019-07-13 – 2019-07-19 (×7): 1 via ORAL
  Filled 2019-07-13 (×8): qty 1

## 2019-07-13 MED ORDER — FUROSEMIDE 10 MG/ML IJ SOLN
40.0000 mg | Freq: Two times a day (BID) | INTRAMUSCULAR | Status: DC
  Administered 2019-07-13 – 2019-07-14 (×2): 40 mg via INTRAVENOUS
  Filled 2019-07-13 (×2): qty 4

## 2019-07-13 MED ORDER — SIMVASTATIN 20 MG PO TABS
40.0000 mg | ORAL_TABLET | Freq: Every day | ORAL | Status: DC
  Administered 2019-07-13 – 2019-07-19 (×7): 40 mg via ORAL
  Filled 2019-07-13 (×2): qty 2
  Filled 2019-07-13: qty 4
  Filled 2019-07-13 (×4): qty 2

## 2019-07-13 MED ORDER — FUROSEMIDE 10 MG/ML IJ SOLN
40.0000 mg | Freq: Two times a day (BID) | INTRAMUSCULAR | Status: DC
  Administered 2019-07-13: 40 mg via INTRAVENOUS
  Filled 2019-07-13: qty 4

## 2019-07-13 MED ORDER — ONDANSETRON HCL 4 MG/2ML IJ SOLN: 4.0000 mg | Freq: Four times a day (QID) | INTRAMUSCULAR | Status: DC | PRN

## 2019-07-13 MED ORDER — GLIPIZIDE 10 MG PO TABS: 10.0000 mg | ORAL_TABLET | Freq: Every day | ORAL | Status: DC

## 2019-07-13 MED ORDER — ZOLPIDEM TARTRATE 5 MG PO TABS
5.0000 mg | ORAL_TABLET | Freq: Every evening | ORAL | Status: DC | PRN
  Administered 2019-07-14: 5 mg via ORAL
  Filled 2019-07-13: qty 1

## 2019-07-13 MED ORDER — APIXABAN 5 MG PO TABS
5.0000 mg | ORAL_TABLET | Freq: Two times a day (BID) | ORAL | Status: DC
  Administered 2019-07-13 – 2019-07-19 (×13): 5 mg via ORAL
  Filled 2019-07-13 (×13): qty 1

## 2019-07-13 MED ORDER — OMEGA-3-ACID ETHYL ESTERS 1 G PO CAPS
1.0000 g | ORAL_CAPSULE | Freq: Every day | ORAL | Status: DC
  Administered 2019-07-13 – 2019-07-19 (×7): 1 g via ORAL
  Filled 2019-07-13 (×9): qty 1

## 2019-07-13 MED ORDER — IOHEXOL 350 MG/ML SOLN
100.0000 mL | Freq: Once | INTRAVENOUS | Status: AC | PRN
  Administered 2019-07-13: 100 mL via INTRAVENOUS

## 2019-07-13 MED ORDER — CARVEDILOL 6.25 MG PO TABS
3.1250 mg | ORAL_TABLET | Freq: Two times a day (BID) | ORAL | Status: DC
  Administered 2019-07-13: 3.125 mg via ORAL
  Filled 2019-07-13: qty 1

## 2019-07-13 MED ORDER — ENOXAPARIN SODIUM 40 MG/0.4ML ~~LOC~~ SOLN: 40.0000 mg | SUBCUTANEOUS | Status: DC

## 2019-07-13 MED ORDER — SACUBITRIL-VALSARTAN 49-51 MG PO TABS
1.0000 | ORAL_TABLET | Freq: Two times a day (BID) | ORAL | Status: DC
  Administered 2019-07-13: 1 via ORAL
  Filled 2019-07-13 (×2): qty 1

## 2019-07-13 MED ORDER — SODIUM CHLORIDE 0.9% FLUSH: 3.0000 mL | Freq: Two times a day (BID) | INTRAVENOUS | Status: DC

## 2019-07-13 NOTE — Consult Note (Signed)
Cardiology Consultation:   Patient ID: [name] MRN: [id number]; DOB: [birth year]  Admit date: 07/12/2019 Date of Consult: 07/13/2019  Primary Care Provider: Mirna Mires, MD Primary Cardiologist: Verne Carrow, MD  Primary Electrophysiologist:  None    Patient Profile:   [name] is a 64 y.o. male with a hx of permanent atrial flutter on Eliquis, CVA 2010, expressive aphasia, chronic R sided weakness s/p CVA, HTN, DM, NICM, HFrEF (EF 20-25%), and who is being seen today for the evaluation of acute on chronic heart failure at the request of Dr. Arville Care.  History of Present Illness:   [name] is a 64 yo male with PMH as above. Due to his history of CVA in 2010 and expressive aphasia, the below history was obtained via patient, niece, and chart biopsy. His niece currently lives with the patient, and she and her sister take care of him. On consultation today, he is able to follow commands and respond to some questions.   He was admitted to [facility] 09/04/2016 with atrial flutter and found to have LVEF 20%. [facility] 09/06/2016 showed mild nonobstrucvitve CAD and findings as below with normal to minimal elevation of R heart pressures. His ostial LAD was 20%s, pLAD 35%s, Ost 1st Diag 40%s, and Ost 2nd Diag to 2nd Diag lesion 20%s. He was felt to have NICM. His HR was controlled on low dose BB. He was also on Eliquis, Entresto, and lasix.   He underwent DCCV 10/10/16. LVEF was improved to 45-50% 11/2016.   He was seen 07/15/2017 and back in atrial flutter with HR 50s.   07/31/2017 admission to [facility] for CHF and diuresed quickly with IV lasix. Home dose of lasix was increased to  qd.  He was last seen by his primary cardiologist, Dr. [name] 04/21/19. He was in atrial fib/flutter at that time, which he reports today that he does not feel. Weight was stable with mild R LEE and some dyspnea. Lasix was thus increased to Lasix  x3 days.   Echo was ordered and  performed 4/30 with LVEF decreased to 20-25% and showing diffuse hypokinesis, worse in the septal, apical, anterior, and inferior walls. There was basal inferior akinesis. There was moderate LVH and mildly reduced RVSF with mild PHTN. Severe LAE and RAE were noted, as well as mild MR with thickening of the mitral valve leaflet and moderate TR.   He recently has been on vacation; however, he notes that even before vacation, he started to notice sx, including DOE.  Over the last week, he has been on vacation at [facility].  Per his niece, he did eat more salt than usual during his vacation.  She did not notice any change in fluid intake.  She did report medication compliance in the past history of smoking.   Over the last 1-2 weeks, or while on vacation at [facility], he noticed a sudden increase in his previous DOE and LEE, as well as new orthopnea, PND, abdominal distention, weight gain, and SOB at rest.  In addition, the patient reports right-sided chest pain that has been going on for the last week and is constant, pleuritic, nonpositional, and rated 2/10 in severity.  No dizziness, recent falls, nausea, emesis, or s/sx of bleeding.   On 07/05/19, the niece called the office with report of increased SOB and DOE, as well as sx as described above. Weight gain was reported of at least 5lbs in the last 2 days. She was advised to increase his  lasix to 80mg  x3 days again and reports today that this was done without relief or improvement in the patient sx. Per review of telephone note, the patient was with his brother, rather than the niece over the last few days, given the vacation. It was noted that he likely was eating out more than usual, as admitted by the patient above.   Due to the symptoms, his niece took him directly to [facility] emergency department on the way home from [facility].  In the ED, EKG showed atrial flutter with ventricular rate of 65 bpm, RAD, incomplete RBBB / IVCD, and poor R wave  progression.  CTA of the chest showed no evidence of pulmonary embolism, mild right basilar atelectasis with associated right-sided effusion, fatty infiltration of the liver, mild ascites.  Labs significant for BNP 1991.9, high-sensitivity troponin 23, sodium 135, potassium 4.3, creatinine 1.17 (baseline 1.09-1.29), albumin 3.8, AST 39, ALT 18, total bilirubin 2.2, hemoglobin 12.5, hct 36.7, WBC 4.8. Ordered AM labs today during consultation. Telemetry as below with NSVT up to 10 beats.  Heart Pathway Score:     Past Medical History:  Diagnosis Date  . Atrial flutter (HCC)   . CHF (congestive heart failure) (HCC)   . Diabetes mellitus   . Hypertension   . 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 Stroke [facility])     Past Surgical History:  Procedure Laterality Date  . CARDIOVERSION N/A 10/10/2016   Procedure: CARDIOVERSION;  Surgeon: 12/10/2016, MD;  Location: [facility] ENDOSCOPY;  Service: Cardiovascular;  Laterality: N/A;  . RIGHT/LEFT HEART CATH AND CORONARY ANGIOGRAPHY N/A 09/06/2016   Procedure: RIGHT/LEFT HEART CATH AND CORONARY ANGIOGRAPHY;  Surgeon: 09/08/2016, MD;  Location: [facility] INVASIVE CV LAB;  Service: Cardiovascular;  Laterality: N/A;  . TRACHEOSTOMY       Home Medications:  Prior to Admission medications   Medication Sig Start Date End Date Taking? Authorizing Provider  acetaminophen (TYLENOL) 500 MG tablet Take 1,000 mg by mouth every 6 (six) hours as needed for pain.   Yes [provider]  apixaban (ELIQUIS) 5 MG TABS tablet Take 1 tablet (5 mg total) by mouth 2 (two) times daily. 03/15/19  Yes 05/15/19, MD  aspirin EC 81 MG EC tablet Take 1 tablet (81 mg total) by mouth daily. 09/09/16  Yes 11/09/16, NP  carvedilol (COREG) 3.125 MG tablet Take 1 tablet (3.125 mg total) by mouth 2 (two) times daily with a meal. 05/14/19  Yes 07/14/19, MD    furosemide (LASIX) 40 MG tablet Take 40 mg by mouth daily.   Yes [provider]  glipiZIDE (GLUCOTROL) 10 MG tablet Take 1 tablet (10 mg total) by mouth daily with breakfast. 09/09/16  Yes 11/09/16, NP  loratadine (CLARITIN) 10 MG tablet TK 1 T PO D 06/29/18  Yes [provider]  metFORMIN (GLUCOPHAGE) 1000 MG tablet Take 1,000 mg by mouth 2 (two) times daily.   Yes [provider]  Multiple Vitamin (MULTIVITAMIN WITH MINERALS) TABS Take 1 tablet by mouth daily.   Yes [provider]  omega-3 acid ethyl esters (LOVAZA) 1 G capsule Take 1 g by mouth daily.   Yes [provider]  sacubitril-valsartan (ENTRESTO) 49-51 MG Take 1 tablet by mouth 2 (two) times daily. 03/29/19  Yes 03/31/19, MD  simvastatin (ZOCOR) 40 MG tablet Take 40 mg by  mouth daily.   Yes [provider]    Inpatient Medications: Scheduled Meds: . apixaban  5 mg Oral BID  . aspirin EC  81 mg Oral Daily  . carvedilol  3.125 mg Oral BID WC  . furosemide  40 mg Intravenous Q12H  . loratadine  10 mg Oral Daily  . multivitamin with minerals  1 tablet Oral Daily  . omega-3 acid ethyl esters  1 g Oral Daily  . sacubitril-valsartan  1 tablet Oral BID  . simvastatin  40 mg Oral Daily   Continuous Infusions: . sodium chloride     PRN Meds: sodium chloride, acetaminophen, ALPRAZolam, ondansetron (ZOFRAN) IV, zolpidem  Allergies:   No Known Allergies  Social History:   Social History   Socioeconomic History  . Marital status: Single    Spouse name: Not on file  . Number of children: Not on file  . Years of education: Not on file  . Highest education level: Not on file  Occupational History  . Not on file  Tobacco Use  . Smoking status: Never Smoker  . Smokeless tobacco: Never Used  Vaping Use  . Vaping Use: Never used  Substance and Sexual Activity  . Alcohol use: No  . Drug use: No  . Sexual activity: Not Currently  Other Topics Concern   . Not on file  Social History Narrative  . Not on file   Social Determinants of Health   Financial Resource Strain:   . Difficulty of Paying Living Expenses:   Food Insecurity:   . Worried About Programme researcher, broadcasting/film/video in the Last Year:   . Barista in the Last Year:   Transportation Needs:   . Freight forwarder (Medical):   Marland Kitchen Lack of Transportation (Non-Medical):   Physical Activity:   . Days of Exercise per Week:   . Minutes of Exercise per Session:   Stress:   . Feeling of Stress :   Social Connections:   . Frequency of Communication with Friends and Family:   . Frequency of Social Gatherings with Friends and Family:   . Attends Religious Services:   . Active Member of Clubs or Organizations:   . Attends Banker Meetings:   Marland Kitchen Marital Status:   Intimate Partner Violence:   . Fear of Current or Ex-Partner:   . Emotionally Abused:   Marland Kitchen Physically Abused:   . Sexually Abused:     Family History:    Family History  Problem Relation Age of Onset  . Hypertension Mother   . Diabetes Mother   . Heart disease Father      ROS:  Please see the history of present illness.  Review of Systems  Constitutional: Positive for malaise/fatigue.  Respiratory: Positive for shortness of breath. Negative for hemoptysis.   Cardiovascular: Positive for chest pain, orthopnea, leg swelling and PND. Negative for palpitations and claudication.       CP x1 week. R sided and pleuritic, non-positional. Constant. 2/10.  Gastrointestinal: Negative for blood in stool, melena and nausea.  Genitourinary: Negative for hematuria.  Musculoskeletal: Negative for falls.  Neurological: Positive for sensory change, focal weakness and weakness. Negative for dizziness and loss of consciousness.       Chronic R sided weakness / paralysis and pain due to CVA    All other ROS reviewed and negative.     Physical Exam/Data:   Vitals:   07/13/19 0200 07/13/19 0500 07/13/19 0520 07/13/19  0821  BP:  106/71 (!) 108/91  93/65  Pulse: (!) 54 (!) 39 (!) 45 67  Resp: 19 15 (!) 25 12  Temp:      TempSrc:      SpO2: 100% 100% 100% 100%  Weight:      Height:       No intake or output data in the 24 hours ending 07/13/19 0927 Last 3 Weights 07/12/2019 04/21/2019 01/27/2019  Weight (lbs) 160 lb 144 lb 6.4 oz 152 lb 3.2 oz  Weight (kg) 72.576 kg 65.499 kg 69.037 kg     Body mass index is 25.82 kg/m.  General:  Well nourished, well developed, in no acute distress. Joined by niece. HEENT: normal Neck: JVP ~11cm Vascular: No carotid bruits; radial pulses 2+ bilaterally Cardiac:  normal S1, S2; IRIR; 2/6 systolic murmur Lungs:  Reduced R basilar breath sounds, L trace crackles Abd: firm, nontender, no hepatomegaly  Ext: 2+ bilateral pitting edema Musculoskeletal:  No deformities, chronic R sided weakness and pain 2/2 stroke  Skin: warm and dry  Neuro:  R sided weakness 2/2 stroke, expressive aphasia Psych:  Normal affect   EKG:  The EKG was personally reviewed and demonstrates:  Atrial flutter, NSVT with longest run 10 beats, bradycardic and tachycardic ventricular rate Telemetry:  Telemetry was personally reviewed and demonstrates:   atrial flutter with ventricular rate of 65 bpm, RAD, incomplete RBBB / IVCD, and poor R wave progression.    Relevant CV Studies: Echo 05/07/19 1. LVEF is severely epressed with diffuse hypokinesis, worse in the  septal, apical, anterior and inferior walls There is basal inferior  akinesis. Compared to echo report of 2018, LVEF is worse.. Left  ventricular ejection fraction, by estimation, is 20  to 25%. There is moderate left ventricular hypertrophy. Left ventricular  diastolic parameters are indeterminate.  2. Right ventricular systolic function is mildly reduced. The right  ventricular size is normal. There is mildly elevated pulmonary artery  systolic pressure.  3. Left atrial size was severely dilated.  4. Right atrial size was severely  dilated.  5. The mitral valve is abnormal. Mild mitral valve regurgitation.  6. Tricuspid valve regurgitation is moderate.  7. The aortic valve is normal in structure. Aortic valve regurgitation is  not visualized.   Echo November 2018: - Left ventricle: The cavity size was normal. Wall thickness was increased in a pattern of severe LVH. Systolic function was mildly reduced. The estimated ejection fraction was in the range of 45% to 50%. Diffuse hypokinesis. Doppler parameters are consistent with abnormal left ventricular relaxation (grade 1 diastolic dysfunction). The E/e&' ratio is between 8-15, suggesting indeterminate LV filling pressure. - Left atrium: The atrium was normal in size. - Tricuspid valve: There was mild regurgitation. - Pulmonary arteries: PA peak pressure: 29 mm Hg (S).   Cardiac cath 09/06/16: Diagnostic Diagram       Ost LAD lesion, 20 %stenosed.  Prox LAD lesion, 35 %stenosed.  Ost 1st Diag lesion, 40 %stenosed.  Ost 2nd Diag to 2nd Diag lesion, 20 %stenosed.   Normal to very minimal elevation of right heart pressures.  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  RECOMMENDATION: Guideline directed medical therapy for the  patient's nonischemic cardiomyopathy  Laboratory Data:  High Sensitivity Troponin:   Recent Labs  Lab 07/12/19 1913  TROPONINIHS 23*     Cardiac EnzymesNo results for input(s): TROPONINI in the last 168 hours. No results for input(s): TROPIPOC in the last 168 hours.  Chemistry Recent Labs  Lab 07/12/19 1913  NA 135  K 4.3  CL 101  CO2 23  GLUCOSE 55*  BUN  16  CREATININE 1.17  CALCIUM 9.6  GFRNONAA >60  GFRAA >60  ANIONGAP 11    Recent Labs  Lab 07/12/19 1913  PROT 7.1  ALBUMIN 3.8  AST 39  ALT 18  ALKPHOS 157*  BILITOT 2.2*   Hematology Recent Labs  Lab 07/12/19 1913  WBC 4.8  RBC 4.05*  HGB 12.5*  HCT 36.7*  MCV 90.6  MCH 30.9  MCHC 34.1  RDW 17.3*  PLT 172   BNP Recent Labs  Lab 07/12/19 1913  BNP 1,991.9*    DDimer No results for input(s): DDIMER in the last 168 hours.   Radiology/Studies:  DG Chest 2 View  Result Date: 07/12/2019 CLINICAL DATA:  Shortness of breath EXAM: CHEST - 2 VIEW COMPARISON:  None. FINDINGS: The heart is mildly enlarged. Vascular calcifications are seen in the aortic arch. There is a small right pleural effusion with associated atelectasis. The left lung is clear without pleural effusion. There is no pneumothorax. The visualized skeletal structures are unremarkable. IMPRESSION: Small right pleural effusion with associated atelectasis. Electronically Signed   By: Romona Curls M.D.   On: 07/12/2019 19:33   CT Angio Chest PE W and/or Wo Contrast  Result Date: 07/13/2019 CLINICAL DATA:  Shortness of breath and abdominal distension EXAM: CT ANGIOGRAPHY CHEST CT ABDOMEN AND PELVIS WITH CONTRAST TECHNIQUE: Multidetector CT imaging of the chest was performed using the standard protocol during bolus administration of intravenous contrast. Multiplanar CT image reconstructions and MIPs were obtained to evaluate the vascular anatomy. Multidetector CT imaging of the abdomen and pelvis was performed using the standard protocol during bolus administration of intravenous contrast. CONTRAST:  OMNIPAQUE IOHEXOL 350 MG/ML SOLN COMPARISON:  None. FINDINGS: CTA CHEST FINDINGS Cardiovascular: Thoracic aorta shows no aneurysmal dilatation. No significant opacification is noted to rule out dissection. Mild are aortic calcifications are noted. The pulmonary artery shows a normal branching pattern without filling  defect to suggest pulmonary embolism. Coronary calcifications are noted. Heart is enlarged in size. No pericardial effusion is seen. Mediastinum/Nodes: Thoracic inlet is within normal limits. No hilar or mediastinal adenopathy is noted. The esophagus as visualized is within normal limits. Lungs/Pleura: Mild right basilar atelectasis is noted. Moderate right-sided pleural effusion is seen. No nodule or pneumothorax is seen. Musculoskeletal: Degenerative changes of the thoracic spine are noted. No acute bony abnormality is seen. Review of the MIP images confirms the above findings. CT ABDOMEN and PELVIS FINDINGS Hepatobiliary: Decreased attenuation of the liver is noted consistent with fatty infiltration. The gallbladder is partially distended. Pancreas: Unremarkable. No pancreatic ductal dilatation or surrounding inflammatory changes. Spleen: Normal in size without focal abnormality. Adrenals/Urinary Tract: Adrenal glands are within normal limits. Kidneys are well visualized bilaterally. No renal calculi or obstructive changes are seen. Scarring is noted in the left kidney. Normal excretion of contrast material is noted. The bladder is well distended. Stomach/Bowel: Appendix is well visualized and demonstrates multiple small appendicoliths within. No inflammatory changes are seen to suggest appendicitis. Small bowel and stomach are within normal limits. Vascular/Lymphatic: Aortic atherosclerosis. No enlarged abdominal or pelvic lymph nodes.  Reproductive: Prostate is unremarkable. Other: No hernia is identified.  Mild ascites is noted. Musculoskeletal: No acute or significant osseous findings. Review of the MIP images confirms the above findings. IMPRESSION: CTA of the chest: No evidence of pulmonary embolism. Mild right basilar atelectasis with associated right-sided effusion. CT of the abdomen and pelvis: Fatty infiltration of the liver. Mild ascites. No other focal abnormality is noted. Electronically Signed   By:  Alcide Clever M.D.   On: 07/13/2019 00:53   CT ABDOMEN PELVIS W CONTRAST  Result Date: 07/13/2019 CLINICAL DATA:  Shortness of breath and abdominal distension EXAM: CT ANGIOGRAPHY CHEST CT ABDOMEN AND PELVIS WITH CONTRAST TECHNIQUE: Multidetector CT imaging of the chest was performed using the standard protocol during bolus administration of intravenous contrast. Multiplanar CT image reconstructions and MIPs were obtained to evaluate the vascular anatomy. Multidetector CT imaging of the abdomen and pelvis was performed using the standard protocol during bolus administration of intravenous contrast. CONTRAST:  OMNIPAQUE IOHEXOL 350 MG/ML SOLN COMPARISON:  None. FINDINGS: CTA CHEST FINDINGS Cardiovascular: Thoracic aorta shows no aneurysmal dilatation. No significant opacification is noted to rule out dissection. Mild are aortic calcifications are noted. The pulmonary artery shows a normal branching pattern without filling defect to suggest pulmonary embolism. Coronary calcifications are noted. Heart is enlarged in size. No pericardial effusion is seen. Mediastinum/Nodes: Thoracic inlet is within normal limits. No hilar or mediastinal adenopathy is noted. The esophagus as visualized is within normal limits. Lungs/Pleura: Mild right basilar atelectasis is noted. Moderate right-sided pleural effusion is seen. No nodule or pneumothorax is seen. Musculoskeletal: Degenerative changes of the thoracic spine are noted. No acute bony abnormality is seen. Review of the MIP images confirms the above findings. CT ABDOMEN and PELVIS FINDINGS Hepatobiliary: Decreased attenuation of the liver is noted consistent with fatty infiltration. The gallbladder is partially distended. Pancreas: Unremarkable. No pancreatic ductal dilatation or surrounding inflammatory changes. Spleen: Normal in size without focal abnormality. Adrenals/Urinary Tract: Adrenal glands are within normal limits. Kidneys are well visualized bilaterally. No  renal calculi or obstructive changes are seen. Scarring is noted in the left kidney. Normal excretion of contrast material is noted. The bladder is well distended. Stomach/Bowel: Appendix is well visualized and demonstrates multiple small appendicoliths within. No inflammatory changes are seen to suggest appendicitis. Small bowel and stomach are within normal limits. Vascular/Lymphatic: Aortic atherosclerosis. No enlarged abdominal or pelvic lymph nodes. Reproductive: Prostate is unremarkable. Other: No hernia is identified.  Mild ascites is noted. Musculoskeletal: No acute or significant osseous findings. Review of the MIP images confirms the above findings. IMPRESSION: CTA of the chest: No evidence of pulmonary embolism. Mild right basilar atelectasis with associated right-sided effusion. CT of the abdomen and pelvis: Fatty infiltration of the liver. Mild ascites. No other focal abnormality is noted. Electronically Signed   By: Alcide Clever M.D.   On: 07/13/2019 00:53    Assessment and Plan:   Acute on chronic HFrEF --Sx as above in HPI and ongoing for 1-2 weeks.  Recent increase in salty diet during vacation at [place] reported. Consider this as exacerbating volume, as well as NSVT as noted on telemetry.  --Previous 2018 R/LHC as above with most recent echo showing 04/2019 EF 20-25% and MR/TR as above.  --CTA at presentation showed right pleural effusion. No PE. Mild R basilar atelectasis.  Mild ascites. --Still volume overloaded on exam with abdominal distention, pitting LEE, reduced right basilar breath sounds.  S/p IV lasix 60mg  x1 and 40mg  x1 today. --  Recommend continue gentle IV diuresis as BP and Cr allow and  with goal output net -2 L daily or until euvolemic on exam.   --Daily BMET.  Ordered labs for this AM during consultation. Most recent creatinine slightly elevated at 1.17 with BUN 16.  Baseline Cr 1.09-1.29. Continue to monitor renal function, electrolytes, and vitals closely.    --Monitor I/os, daily standing weights. No daily weights or I/Os reported yet. --Continue Entresto and Coreg as BP allows. With ongoing hypotension, consider holding or reducing Entresto dose. Could consider holding Coreg or changing to metoprolol to allow for room ongoing diuresis and reduce risk of prerenal AKI.   Chest pain, atypical -Reports chronic 2/10 chest discomfort x1 week.  Pain is further described as pleuritic but nonpositional. Relatively atypical CP in description. Previous LHC as above with mild obstructive dz. Recent echo with EF drop to 20-25%.  --EKG without acute ST or T changes. HS Tn 23 at presentation but not cycled. Recommend continue to cycle until peaked and down-trending. Consider also that HS Tn could be elevated in the setting of volume overload, hypotension, Cr, and NSVT. At this time, presentation less consistent with ACS but will continue to monitor.  --No indication for emergent ischemic work-up; however, once volume status optimized, consider further ischemic workup, especially if ongoing CP. Consider also CP that may be 2/2 CTA findings as above - right pleural effusion and ascites.  Continue to monitor.  Hypotension --Asx. BP soft to hypotensive with SBP low 100s-99 and DBP 60-90s.  --With ongoing hypotension, consider holding or reducing second dose of Entresto or Coreg or to prevent prerenal AKI and allow room for IV diuresis. Could also consider transition from Coreg to metoprolol for ongoing rate control without as much drop in BP.   NSVT --Asx. Consider as exacerbation HFrEF and hypotension - chronicity unknown. Several runs of NSVT on telemetry this AM with longest run 10 beats. --Will need to consider chronotropic competence before discharge if possible, given R sided weakness.  --Ordered TSH, Mg. Most recent K at goal and pending repeat K this AM. --Continue low dose Coreg for now. Could consider transitioning to metoprolol if ongoing low BP to allow for  rate control without as much reduction in BP.  Permanent Atrial flutter with controlled ventricular rate --Known permanent atrial flutter s/p DCCV as above.  --Asx with usually controlled ventricular rate. --Continue current BB with recommendations as outlined above if ongoing low BP. --Continue anticoagulation with Eliquis 5mg  BID. He has not missed any doses. Ordered repeat Hgb to ensure that low Hgb due to dillution. Consider iron panel to confirm. Hct stable.  Denies any s/sx of bleeding. --No current plan for repeat DCCV given asx and controlled ventricular rate in Afib.   Anemia --Suspect dilution etiology. Ordered repeat Hgb to ensure that low Hgb due to dillution. Consider anemia panel or FOBT to confirm. Hct stable.  Denies any s/sx of bleeding.  For questions or updates, please contact [facility] [facility] Please consult [email or web address] for contact info under     Signed, Lennon Alstrom, PA-C  07/13/2019 9:27 AM

## 2019-07-13 NOTE — H&P (Addendum)
[facility] [place] NAME: [name]    MR#:  [id number]  DATE OF BIRTH:  [birth year]  DATE OF ADMISSION:  07/12/2019  PRIMARY CARE PHYSICIAN: Iona Beard, MD   REQUESTING/REFERRING PHYSICIAN: Merlyn Lot, MD  CHIEF COMPLAINT:   Chief Complaint  Patient presents with  . Shortness of Breath    HISTORY OF PRESENT ILLNESS:  [name]  is a 64 y.o. African-American male with a known history of CHF, atrial flutter, type 2 diabetes mellitus, hypertension and nonischemic cardiomyopathy as well as CVA, who presented to the emergency room with acute onset of worsening dyspnea with associated orthopnea and lower extremity edema as well as abdominal distension, cough which has been mainly dry as well as wheezing.  He denies any chest pain or palpitations.  No nausea or vomiting or diarrhea or melena or bright red bleeding per rectum.  No fever or chills.  Upon presentation to the emergency room, respiratory rate was 22 and otherwise vital signs were within normal.  Labs revealed hyperglycemia with a blood glucose of 55 and elevated alk phos 157 and serum lipase was 75 with total bili of 2.2 direct bili 1.3 normal is T and ALT.  BNP was 1991.9 and high-sensitivity troponin I was 23.  CBC showed mild anemia and INR was 1.7 with a PT of 19.2.  COVID-19 PCR came back negative. EKG showed atrial fibrillation with controlled ventricular sponsor of 66, right axis deviation and nonspecific T wave abnormality.  Two-view chest x-ray showed small right pleural effusion with associated atelectasis.  Chest CTA revealed no evidence for PE.  It showed mild right basal atelectasis with associated right-sided effusion.  Abdominal and pelvic CT scan showed fatty infiltration of the liver with mild ascites.  The patient was given half an amp of D50 and Lasix 60 mg IV.  He will be admitted to a progressive unit bed for further evaluation and management. PAST MEDICAL HISTORY:    Past Medical History:  Diagnosis Date  . Atrial flutter ([place])   . CHF (congestive heart failure) ([place])   . Diabetes mellitus   . Hypertension   . NICM (nonischemic cardiomyopathy) ([place]) 08/2016   Mild, nonobstructive CAD at cath, EF initially 20%, improved to 50% 11/2016 echo  . Stroke [facility])     PAST SURGICAL HISTORY:   Past Surgical History:  Procedure Laterality Date  . CARDIOVERSION N/A 10/10/2016   Procedure: CARDIOVERSION;  Surgeon: Lelon Perla, MD;  Location: [facility] ENDOSCOPY;  Service: Cardiovascular;  Laterality: N/A;  . RIGHT/LEFT HEART CATH AND CORONARY ANGIOGRAPHY N/A 09/06/2016   Procedure: RIGHT/LEFT HEART CATH AND CORONARY ANGIOGRAPHY;  Surgeon: Troy Sine, MD;  Location: [facility] CV LAB;  Service: Cardiovascular;  Laterality: N/A;  . TRACHEOSTOMY      SOCIAL HISTORY:   Social History   Tobacco Use  . Smoking status: Never Smoker  . Smokeless tobacco: Never Used  Substance Use Topics  . Alcohol use: No    FAMILY HISTORY:   Family History  Problem Relation Age of Onset  . Hypertension Mother   . Diabetes Mother   . Heart disease Father     DRUG ALLERGIES:  No Known Allergies  REVIEW OF SYSTEMS:   ROS As per history of present illness. All pertinent systems were reviewed above. Constitutional,  HEENT, cardiovascular, respiratory, GI, GU, musculoskeletal, neuro, psychiatric, endocrine,  integumentary and hematologic systems were reviewed and are otherwise  negative/unremarkable except for positive  findings mentioned above in the HPI.   MEDICATIONS AT HOME:   Prior to Admission medications   Medication Sig Start Date End Date Taking? Authorizing Provider  acetaminophen (TYLENOL) 500 MG tablet Take 1,000 mg by mouth every 6 (six) hours as needed for pain.   Yes [provider]  apixaban (ELIQUIS) 5 MG TABS tablet Take 1 tablet (5 mg total) by mouth 2 (two) times daily. 03/15/19  Yes Burnell Blanks, MD  aspirin EC 81 MG  EC tablet Take 1 tablet (81 mg total) by mouth daily. 09/09/16  Yes Isaiah Serge, NP  carvedilol (COREG) 3.125 MG tablet Take 1 tablet (3.125 mg total) by mouth 2 (two) times daily with a meal. 05/14/19  Yes Burnell Blanks, MD  furosemide (LASIX) 40 MG tablet Take 40 mg by mouth daily.   Yes [provider]  glipiZIDE (GLUCOTROL) 10 MG tablet Take 1 tablet (10 mg total) by mouth daily with breakfast. 09/09/16  Yes Isaiah Serge, NP  loratadine (CLARITIN) 10 MG tablet TK 1 T PO D 06/29/18  Yes [provider]  metFORMIN (GLUCOPHAGE) 1000 MG tablet Take 1,000 mg by mouth 2 (two) times daily.   Yes [provider]  Multiple Vitamin (MULTIVITAMIN WITH MINERALS) TABS Take 1 tablet by mouth daily.   Yes [provider]  omega-3 acid ethyl esters (LOVAZA) 1 G capsule Take 1 g by mouth daily.   Yes [provider]  sacubitril-valsartan (ENTRESTO) 49-51 MG Take 1 tablet by mouth 2 (two) times daily. 03/29/19  Yes Burnell Blanks, MD  simvastatin (ZOCOR) 40 MG tablet Take 40 mg by mouth daily.   Yes [provider]      VITAL SIGNS:  Blood pressure 124/87, pulse 68, temperature 98.1 F (36.7 C), resp. rate (!) 26, height '5\' 6"'  (1.676 m), weight 72.6 kg, SpO2 100 %.  PHYSICAL EXAMINATION:  Physical Exam  GENERAL:  64 y.o.-year-old African-American male patient lying in the bed with mild respiratory distress with conversational dyspnea. EYES: Pupils equal, round, reactive to light and accommodation. No scleral icterus. Extraocular muscles intact.  HEENT: Head atraumatic, normocephalic. Oropharynx and nasopharynx clear.  NECK:  Supple, no jugular venous distention. No thyroid enlargement, no tenderness.  LUNGS: Diminished bibasal breath sounds with mild bibasal rales.   CARDIOVASCULAR: Regular rate and rhythm, S1, S2 normal. No murmurs, rubs, or gallops.  ABDOMEN: Soft, nondistended, nontender. Bowel sounds present. No organomegaly or  mass.  EXTREMITIES: 2+ bilateral lower extremity pitting edema, with no cyanosis, or clubbing.  NEUROLOGIC: Cranial nerves II through XII are intact. Muscle strength 5/5 in all extremities. Sensation intact. Gait not checked.  PSYCHIATRIC: The patient is alert and oriented x 3.  Normal affect and good eye contact. SKIN: No obvious rash, lesion, or ulcer.   LABORATORY PANEL:   CBC Recent Labs  Lab 07/12/19 1913  WBC 4.8  HGB 12.5*  HCT 36.7*  PLT 172   ------------------------------------------------------------------------------------------------------------------  Chemistries  Recent Labs  Lab 07/12/19 1913  NA 135  K 4.3  CL 101  CO2 23  GLUCOSE 55*  BUN 16  CREATININE 1.17  CALCIUM 9.6  AST 39  ALT 18  ALKPHOS 157*  BILITOT 2.2*   ------------------------------------------------------------------------------------------------------------------  Cardiac Enzymes No results for input(s): TROPONINI in the last 168 hours. ------------------------------------------------------------------------------------------------------------------  RADIOLOGY:  DG Chest 2 View  Result Date: 07/12/2019 CLINICAL DATA:  Shortness of breath EXAM: CHEST - 2 VIEW COMPARISON:  None. FINDINGS: The heart is mildly  enlarged. Vascular calcifications are seen in the aortic arch. There is a small right pleural effusion with associated atelectasis. The left lung is clear without pleural effusion. There is no pneumothorax. The visualized skeletal structures are unremarkable. IMPRESSION: Small right pleural effusion with associated atelectasis. Electronically Signed   By: Zerita Boers M.D.   On: 07/12/2019 19:33   CT Angio Chest PE W and/or Wo Contrast  Result Date: 07/13/2019 CLINICAL DATA:  Shortness of breath and abdominal distension EXAM: CT ANGIOGRAPHY CHEST CT ABDOMEN AND PELVIS WITH CONTRAST TECHNIQUE: Multidetector CT imaging of the chest was performed using the standard protocol during bolus  administration of intravenous contrast. Multiplanar CT image reconstructions and MIPs were obtained to evaluate the vascular anatomy. Multidetector CT imaging of the abdomen and pelvis was performed using the standard protocol during bolus administration of intravenous contrast. CONTRAST:  176m OMNIPAQUE IOHEXOL 350 MG/ML SOLN COMPARISON:  None. FINDINGS: CTA CHEST FINDINGS Cardiovascular: Thoracic aorta shows no aneurysmal dilatation. No significant opacification is noted to rule out dissection. Mild are aortic calcifications are noted. The pulmonary artery shows a normal branching pattern without filling defect to suggest pulmonary embolism. Coronary calcifications are noted. Heart is enlarged in size. No pericardial effusion is seen. Mediastinum/Nodes: Thoracic inlet is within normal limits. No hilar or mediastinal adenopathy is noted. The esophagus as visualized is within normal limits. Lungs/Pleura: Mild right basilar atelectasis is noted. Moderate right-sided pleural effusion is seen. No nodule or pneumothorax is seen. Musculoskeletal: Degenerative changes of the thoracic spine are noted. No acute bony abnormality is seen. Review of the MIP images confirms the above findings. CT ABDOMEN and PELVIS FINDINGS Hepatobiliary: Decreased attenuation of the liver is noted consistent with fatty infiltration. The gallbladder is partially distended. Pancreas: Unremarkable. No pancreatic ductal dilatation or surrounding inflammatory changes. Spleen: Normal in size without focal abnormality. Adrenals/Urinary Tract: Adrenal glands are within normal limits. Kidneys are well visualized bilaterally. No renal calculi or obstructive changes are seen. Scarring is noted in the left kidney. Normal excretion of contrast material is noted. The bladder is well distended. Stomach/Bowel: Appendix is well visualized and demonstrates multiple small appendicoliths within. No inflammatory changes are seen to suggest appendicitis. Small  bowel and stomach are within normal limits. Vascular/Lymphatic: Aortic atherosclerosis. No enlarged abdominal or pelvic lymph nodes. Reproductive: Prostate is unremarkable. Other: No hernia is identified.  Mild ascites is noted. Musculoskeletal: No acute or significant osseous findings. Review of the MIP images confirms the above findings. IMPRESSION: CTA of the chest: No evidence of pulmonary embolism. Mild right basilar atelectasis with associated right-sided effusion. CT of the abdomen and pelvis: Fatty infiltration of the liver. Mild ascites. No other focal abnormality is noted. Electronically Signed   By: MInez CatalinaM.D.   On: 07/13/2019 00:53   CT ABDOMEN PELVIS W CONTRAST  Result Date: 07/13/2019 CLINICAL DATA:  Shortness of breath and abdominal distension EXAM: CT ANGIOGRAPHY CHEST CT ABDOMEN AND PELVIS WITH CONTRAST TECHNIQUE: Multidetector CT imaging of the chest was performed using the standard protocol during bolus administration of intravenous contrast. Multiplanar CT image reconstructions and MIPs were obtained to evaluate the vascular anatomy. Multidetector CT imaging of the abdomen and pelvis was performed using the standard protocol during bolus administration of intravenous contrast. CONTRAST:  1042mOMNIPAQUE IOHEXOL 350 MG/ML SOLN COMPARISON:  None. FINDINGS: CTA CHEST FINDINGS Cardiovascular: Thoracic aorta shows no aneurysmal dilatation. No significant opacification is noted to rule out dissection. Mild are aortic calcifications are noted. The pulmonary artery shows a normal branching  pattern without filling defect to suggest pulmonary embolism. Coronary calcifications are noted. Heart is enlarged in size. No pericardial effusion is seen. Mediastinum/Nodes: Thoracic inlet is within normal limits. No hilar or mediastinal adenopathy is noted. The esophagus as visualized is within normal limits. Lungs/Pleura: Mild right basilar atelectasis is noted. Moderate right-sided pleural effusion is  seen. No nodule or pneumothorax is seen. Musculoskeletal: Degenerative changes of the thoracic spine are noted. No acute bony abnormality is seen. Review of the MIP images confirms the above findings. CT ABDOMEN and PELVIS FINDINGS Hepatobiliary: Decreased attenuation of the liver is noted consistent with fatty infiltration. The gallbladder is partially distended. Pancreas: Unremarkable. No pancreatic ductal dilatation or surrounding inflammatory changes. Spleen: Normal in size without focal abnormality. Adrenals/Urinary Tract: Adrenal glands are within normal limits. Kidneys are well visualized bilaterally. No renal calculi or obstructive changes are seen. Scarring is noted in the left kidney. Normal excretion of contrast material is noted. The bladder is well distended. Stomach/Bowel: Appendix is well visualized and demonstrates multiple small appendicoliths within. No inflammatory changes are seen to suggest appendicitis. Small bowel and stomach are within normal limits. Vascular/Lymphatic: Aortic atherosclerosis. No enlarged abdominal or pelvic lymph nodes. Reproductive: Prostate is unremarkable. Other: No hernia is identified.  Mild ascites is noted. Musculoskeletal: No acute or significant osseous findings. Review of the MIP images confirms the above findings. IMPRESSION: CTA of the chest: No evidence of pulmonary embolism. Mild right basilar atelectasis with associated right-sided effusion. CT of the abdomen and pelvis: Fatty infiltration of the liver. Mild ascites. No other focal abnormality is noted. Electronically Signed   By: Inez Catalina M.D.   On: 07/13/2019 00:53      IMPRESSION AND PLAN:   1.  Acute on chronic systolic CHF.  The patient has an EF of 20 to 25% on a 2D echo on 05/07/2019. -He will be admitted to a progressive unit bed. -He will be diuresed with IV Lasix and continue Entresto and beta-blocker therapy. -We will follow serial troponins -Cardiology consultation will be  obtained. -I notified Dr. Caryl Comes about the patient  2.  Chronic atrial fibrillation. -We will continue Coreg and Eliquis.  3.  Type 2 diabetes mellitus, not on long-acting insulin, with hypoglycemia. -The patient will will be placed on supplemental coverage with NovoLog. We will hold off his Glucotrol  4.  Dyslipidemia. -Statin therapy will be resumed.  5.  Essential hypertension.  -His Coreg will be resumed and Entresto.  6.  DVT prophylaxis. -Subcutaneous Lovenox   All the records are reviewed and case discussed with ED provider. The plan of care was discussed in details with the patient (and his niece). I answered all questions. The patient and his niece agreed to proceed with the above mentioned plan. Further management will depend upon hospital course.   CODE STATUS: Full code  Status is: Inpatient  Remains inpatient appropriate because:Altered mental status, Ongoing diagnostic testing needed not appropriate for outpatient work up, Unsafe d/c plan, IV treatments appropriate due to intensity of illness or inability to take PO and Inpatient level of care appropriate due to severity of illness   Dispo: The patient is from: Home              Anticipated d/c is to: Home              Anticipated d/c date is: 2 days              Patient currently is not medically stable to d/c.  TOTAL TIME TAKING CARE OF THIS PATIENT: 50 minutes.    Christel Mormon M.D on 07/13/2019 at 2:20 AM  [facility]   From 7 PM-7 AM, contact night-coverage [email or web address]  CC: Primary care physician; Iona Beard, MD   Note: This dictation was prepared with Dragon dictation along with smaller phrase technology. Any transcriptional typo errors that result from this process are unintentional.

## 2019-07-13 NOTE — Progress Notes (Signed)
No charge progress note.  [name]  is a 64 y.o. African-American male with a known history of CHF, atrial flutter, type 2 diabetes mellitus, hypertension and nonischemic cardiomyopathy as well as CVA, who presented to the emergency room with acute onset of worsening dyspnea with associated orthopnea and lower extremity edema as well as abdominal distension, cough which has been mainly dry as well as wheezing.  He denies any chest pain or palpitations. Patient was admitted for acute on chronic systolic heart failure. He was started on IV Lasix.  Cardiology was consulted and they are holding carvedilol and decreasing the dose of Entresto due to softer blood pressures.  Patient has no new complaints when seen today.  He was eating his breakfast.  On physical exam he was having distended abdomen and 2+ lower extremity edema with clear lungs.  Continue current management.

## 2019-07-13 NOTE — ED Provider Notes (Addendum)
[facility] Emergency Department Provider Note    First MD Initiated Contact with Patient 07/12/19 2352     (approximate)  I have reviewed the triage vital signs and the nursing notes.   HISTORY  Chief Complaint Shortness of Breath    HPI [name] is a 64 y.o. male bolus past medical history presents to the ER for worsening shortness of breath of the past several weeks.  He has been told that he was to increase his Lasix and have done so as recommended by cardiology but is not having any improvement.  He is now feeling like he is having worsening swelling in his legs now distention in his abdomen.  Denies any chest pain.  Does feel some generalized weakness.  No measured fevers.  Does have some right-sided discomfort as well.    Past Medical History:  Diagnosis Date  . Atrial flutter (HCC)   . CHF (congestive heart failure) (HCC)   . Diabetes mellitus   . Hypertension   . 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 Stroke [facility])    Family History  Problem Relation Age of Onset  . Hypertension Mother   . Diabetes Mother   . Heart disease Father    Past Surgical History:  Procedure Laterality Date  . CARDIOVERSION N/A 10/10/2016   Procedure: CARDIOVERSION;  Surgeon: Lewayne Bunting, MD;  Location: [facility] ENDOSCOPY;  Service: Cardiovascular;  Laterality: N/A;  . RIGHT/LEFT HEART CATH AND CORONARY ANGIOGRAPHY N/A 09/06/2016   Procedure: RIGHT/LEFT HEART CATH AND CORONARY ANGIOGRAPHY;  Surgeon: Lennette Bihari, MD;  Location: [facility] INVASIVE CV LAB;  Service: Cardiovascular;  Laterality: N/A;  . TRACHEOSTOMY     Patient Active Problem List   Diagnosis Date Noted  . Acute systolic CHF (congestive heart failure) (HCC) 08/01/2017  . Acute CHF (congestive heart failure) (HCC) 08/01/2017  . Acute on chronic systolic congestive heart failure (HCC)   . Persistent  atrial fibrillation (HCC)   . Essential hypertension 07/15/2017  . Type 2 diabetes mellitus without complication, without long-term current use of insulin (HCC) 09/09/2016  . Acute systolic (congestive) heart failure (HCC) 09/07/2016  . Atrial flutter (HCC) 09/04/2016  . Cardiomyopathy (HCC) 06/02/2008  . ABNORMAL EKG 05/02/2008  . DIABETES MELLITUS, TYPE II 04/28/2008  . HLD (hyperlipidemia) 04/28/2008  . MIGRAINE HEADACHE 04/28/2008  . History of stroke 04/28/2008      Prior to Admission medications   Medication Sig Start Date End Date Taking? Authorizing Provider  acetaminophen (TYLENOL) 500 MG tablet Take 1,000 mg by mouth every 6 (six) hours as needed for pain.    [provider]  apixaban (ELIQUIS) 5 MG TABS tablet Take 1 tablet (5 mg total) by mouth 2 (two) times daily. 03/15/19   Kathleene Hazel, MD  aspirin EC 81 MG EC tablet Take 1 tablet (81 mg total) by mouth daily. 09/09/16   Leone Brand, NP  carvedilol (COREG) 3.125 MG tablet Take 1 tablet (3.125 mg total) by mouth 2 (two) times daily with a meal. 05/14/19   Kathleene Hazel, MD  furosemide (LASIX) 40 MG tablet Take 40 mg by mouth daily.    [provider]  glipiZIDE (GLUCOTROL) 10 MG tablet Take 1 tablet (10 mg total) by mouth daily with breakfast. 09/09/16   Leone Brand, NP  loratadine (CLARITIN) 10 MG tablet TK 1 T PO D 06/29/18  [provider]  metFORMIN (GLUCOPHAGE) 1000 MG tablet Take 1,000 mg by mouth 2 (two) times daily.    [provider]  Multiple Vitamin (MULTIVITAMIN WITH MINERALS) TABS Take 1 tablet by mouth daily.    [provider]  omega-3 acid ethyl esters (LOVAZA) 1 G capsule Take 1 g by mouth daily.    [provider]  sacubitril-valsartan (ENTRESTO) 49-51 MG Take 1 tablet by mouth 2 (two) times daily. 03/29/19   Kathleene Hazel, MD  simvastatin (ZOCOR) 40 MG tablet Take 40 mg by mouth daily.    [provider]     Allergies Patient has no known allergies.    Social History Social History   Tobacco Use  . Smoking status: Never Smoker  . Smokeless tobacco: Never Used  Vaping Use  . Vaping Use: Never used  Substance Use Topics  . Alcohol use: No  . Drug use: No    Review of Systems Patient denies headaches, rhinorrhea, blurry vision, numbness, shortness of breath, chest pain, edema, cough, abdominal pain, nausea, vomiting, diarrhea, dysuria, fevers, rashes or hallucinations unless otherwise stated above in HPI. ____________________________________________   PHYSICAL EXAM:  VITAL SIGNS: Vitals:   07/13/19 0007 07/13/19 0015  BP: 124/87 124/87  Pulse: 92 68  Resp: 19 (!) 26  Temp:    SpO2:  100%    Constitutional: Alert and oriented.  Eyes: Conjunctivae are normal.  Head: Atraumatic. Nose: No congestion/rhinnorhea. Mouth/Throat: Mucous membranes are moist.   Neck: No stridor. Painless ROM.  Cardiovascular: Normal rate, regular rhythm. Grossly normal heart sounds.  Good peripheral circulation. Respiratory: Normal respiratory effort.  No retractions. Lungs CTAB. Gastrointestinal: Soft and nontender. + distension. No abdominal bruits. No CVA tenderness. Genitourinary: deferred Musculoskeletal: No lower extremity tenderness, 2+ BLE edema.  No joint effusions. Neurologic:  Normal speech and language. No gross focal neurologic deficits are appreciated. No facial droop Skin:  Skin is warm, dry and intact. No rash noted. Psychiatric: Mood and affect are normal. Speech and behavior are normal.  ____________________________________________   LABS (all labs ordered are listed, but only abnormal results are displayed)  Results for orders placed or performed during the hospital encounter of 07/12/19 (from the past 24 hour(s))  Basic metabolic panel     Status: Abnormal   Collection Time: 07/12/19  7:13 PM  Result Value Ref Range   Sodium 135 135 - 145 mmol/L   Potassium 4.3 3.5  - 5.1 mmol/L   Chloride 101 98 - 111 mmol/L   CO2 23 22 - 32 mmol/L   Glucose, Bld 55 (L) 70 - 99 mg/dL   BUN 16 8 - 23 mg/dL   Creatinine, Ser 0.38 0.61 - 1.24 mg/dL   Calcium 9.6 8.9 - 88.2 mg/dL   GFR calc non Af Amer >60 >60 mL/min   GFR calc Af Amer >60 >60 mL/min   Anion gap 11 5 - 15  CBC     Status: Abnormal   Collection Time: 07/12/19  7:13 PM  Result Value Ref Range   WBC 4.8 4.0 - 10.5 K/uL   RBC 4.05 (L) 4.22 - 5.81 MIL/uL   Hemoglobin 12.5 (L) 13.0 - 17.0 g/dL   HCT 80.0 (L) 39 - 52 %   MCV 90.6 80.0 - 100.0 fL   MCH 30.9 26.0 - 34.0 pg   MCHC 34.1 30.0 - 36.0 g/dL   RDW 34.9 (H) 17.9 - 15.0 %   Platelets 172 150 - 400 K/uL   nRBC  0.0 0.0 - 0.2 %  Troponin I (High Sensitivity)     Status: Abnormal   Collection Time: 07/12/19  7:13 PM  Result Value Ref Range   Troponin I (High Sensitivity) 23 (H) <18 ng/L  Brain natriuretic peptide     Status: Abnormal   Collection Time: 07/12/19  7:13 PM  Result Value Ref Range   B Natriuretic Peptide 1,991.9 (H) 0.0 - 100.0 pg/mL  Hepatic function panel     Status: Abnormal   Collection Time: 07/12/19  7:13 PM  Result Value Ref Range   Total Protein 7.1 6.5 - 8.1 g/dL   Albumin 3.8 3.5 - 5.0 g/dL   AST 39 15 - 41 U/L   ALT 18 0 - 44 U/L   Alkaline Phosphatase 157 (H) 38 - 126 U/L   Total Bilirubin 2.2 (H) 0.3 - 1.2 mg/dL   Bilirubin, Direct 0.9 (H) 0.0 - 0.2 mg/dL   Indirect Bilirubin 1.3 (H) 0.3 - 0.9 mg/dL  Lipase, blood     Status: Abnormal   Collection Time: 07/12/19  7:13 PM  Result Value Ref Range   Lipase 75 (H) 11 - 51 U/L  Glucose, capillary     Status: Abnormal   Collection Time: 07/12/19 11:58 PM  Result Value Ref Range   Glucose-Capillary 61 (L) 70 - 99 mg/dL   ____________________________________________  EKG My review and personal interpretation at Time: 19:09   Indication: sob  Rate: 65  Rhythm: afib Axis: normal Other: normal intervals, no  stemi ____________________________________________  RADIOLOGY  I personally reviewed all radiographic images ordered to evaluate for the above acute complaints and reviewed radiology reports and findings.  These findings were personally discussed with the patient.  Please see medical record for radiology report.  ____________________________________________   PROCEDURES  Procedure(s) performed:  .Critical Care Performed by: Willy Eddy, MD Authorized by: Willy Eddy, MD   Critical care provider statement:    Critical care time (minutes):  10   Critical care time was exclusive of:  Separately billable procedures and treating other patients   Critical care was necessary to treat or prevent imminent or life-threatening deterioration of the following conditions:  Circulatory failure   Critical care was time spent personally by me on the following activities:  Development of treatment plan with patient or surrogate, discussions with consultants, evaluation of patient's response to treatment, examination of patient, obtaining history from patient or surrogate, ordering and performing treatments and interventions, ordering and review of laboratory studies, ordering and review of radiographic studies, pulse oximetry, re-evaluation of patient's condition and review of old charts      Critical Care performed: yes ____________________________________________   INITIAL IMPRESSION / ASSESSMENT AND PLAN / ED COURSE  Pertinent labs & imaging results that were available during my care of the patient were reviewed by me and considered in my medical decision making (see chart for details).   DDX: CHF, anasarca, liver failure, ACS, PE, mass, obstruction, renal failure, sepsis  [name] is a 64 y.o. who presents to the ED with symptoms as described above.  Patient is afebrile but markedly tachypneic and dyspneic with standing and minimal exertion.  Noted that his blood work from  triage showed hypoglycemia and he is a diabetic.  Repeat CBG at blood sugar persistently low at 61 despite drinking orange juice.  Will be given half amp of D50.  Blood work is fairly unremarkable to begin with but will order additional LFTs as he does have abdominal distention and  appears anasarca.  His EKG she is nonischemic does show chronic A. fib.  Based on his presentation I am to order CT imaging to evaluate for the but differential.  Clinical Course as of Jul 12 101  Tue Jul 13, 2019  0102 CT imaging results reviewed with the patient.  He is stable and comfortable while at rest but with any exertion becomes significantly dyspneic.  No PE, mass noted.  Mild ascites.  Effusion noted.  His BNP is significantly elevated does not seem consistent with acute cirrhosis or SBP.  No signs of infectious process.  I am going to order IV Lasix and I do feel patient require hospitalization for aggressive diuresis.   [PR]    Clinical Course User Index [PR] Willy Eddy, MD    The patient was evaluated in Emergency Department today for the symptoms described in the history of present illness. He/she was evaluated in the context of the global COVID-19 pandemic, which necessitated consideration that the patient might be at risk for infection with the SARS-CoV-2 virus that causes COVID-19. Institutional protocols and algorithms that pertain to the evaluation of patients at risk for COVID-19 are in a state of rapid change based on information released by regulatory bodies including the CDC and federal and state organizations. These policies and algorithms were followed during the patient's care in the ED.  As part of my medical decision making, I reviewed the following data within the electronic MEDICAL RECORD NUMBER Nursing notes reviewed and incorporated, Labs reviewed, notes from prior ED visits and [place] Controlled Substance Database   ____________________________________________   FINAL CLINICAL  IMPRESSION(S) / ED DIAGNOSES  Final diagnoses:  Shortness of breath  Edema, unspecified type  Acute on chronic congestive heart failure, unspecified heart failure type (HCC)      NEW MEDICATIONS STARTED DURING THIS VISIT:  New Prescriptions   No medications on file     Note:  This document was prepared using Dragon voice recognition software and may include unintentional dictation errors.    Willy Eddy, MD 07/13/19 [name]    Willy Eddy, MD 07/13/19 [phone]

## 2019-07-13 NOTE — TOC Initial Note (Signed)
Transition of Care [facility]) - Initial/Assessment Note    Patient Details  Name: [name] MRN: [id number] Date of Birth: 03/09/1955  [facility] CM/SW Contact:    Joseph Art, LCSWA Phone Number: 07/13/2019, 10:25 AM  Clinical Narrative:                  Patient presents to [facility] due to SOB.  CSW spoke with patient's niece Colin Rhein [phone], for status update and collateral information.  Ms. Magnus Ivan stated she currently lives with the patient and she and her sister take care of him.  Ms. Magnus Ivan stated the patient is able to dress, bathe on his own and is not incontinent.  Ms. Magnus Ivan stated the patient does not drive and she or her sister take him to doctor's appointments, pick up his medication and groceries.  Ms. Vedia Coffer stated the patient is able to perform some ADLs at home, like preparing light meals, but she has noticed increased shortness of breath in the last month when he moves around.  Ms. Magnus Ivan stated the patient does not want to go to a skilled nursing facility and would rather have home health.    Expected Discharge Plan: Skilled Nursing Facility Barriers to Discharge: No Barriers Identified   Patient Goals and CMS Choice Patient states their goals for this hospitalization and ongoing recovery are:: Pateitn would like to go home with home health.  Does not want to go to skilled nursing. CMS Medicare.gov Compare Post Acute Care list provided to:: Other (Comment Required) Colin Rhein [phone], niece) Choice offered to / list presented to : Adult Children  Expected Discharge Plan and Services Expected Discharge Plan: Skilled Nursing Facility In-house Referral: Clinical Social Work   Post Acute Care Choice: Home Health Living arrangements for the past 2 months: Single Family Home                                      Prior Living Arrangements/Services Living arrangements for the past 2 months: Single Family  Home Lives with:: Relatives (Patient lives with niece.) Patient language and need for interpreter reviewed:: Yes Do you feel safe going back to the place where you live?: Yes      Need for Family Participation in Patient Care: Yes (Comment) Care giver support system in place?: Yes (comment)   Criminal Activity/Legal Involvement Pertinent to Current Situation/Hospitalization: No - Comment as needed  Activities of Daily Living      Permission Sought/Granted Permission sought to share information with : Facility Industrial/product designer granted to share information with : Yes, Verbal Permission Granted  Share Information with NAME: Colin Rhein     Permission granted to share info w Relationship: niece  Permission granted to share info w Contact Information: [phone]  Emotional Assessment Appearance:: Appears stated age Attitude/Demeanor/Rapport: Unable to Assess Affect (typically observed): Unable to Assess Orientation: : Fluctuating Orientation (Suspected and/or reported Sundowners) Alcohol / Substance Use: Not Applicable Psych Involvement: No (comment)  Admission diagnosis:  Acute CHF (congestive heart failure) (HCC) [I50.9] Patient Active Problem List   Diagnosis Date Noted  . Acute systolic CHF (congestive heart failure) (HCC) 08/01/2017  . Acute CHF (congestive heart failure) (HCC) 08/01/2017  . Acute on chronic systolic congestive heart failure (HCC)   . Persistent atrial fibrillation (HCC)   . Essential hypertension 07/15/2017  . Type 2 diabetes mellitus without complication, without long-term current  use of insulin (HCC) 09/09/2016  . Acute systolic (congestive) heart failure (HCC) 09/07/2016  . Atrial flutter (HCC) 09/04/2016  . Cardiomyopathy (HCC) 06/02/2008  . ABNORMAL EKG 05/02/2008  . DIABETES MELLITUS, TYPE II 04/28/2008  . HLD (hyperlipidemia) 04/28/2008  . MIGRAINE HEADACHE 04/28/2008  . History of stroke 04/28/2008   PCP:  Mirna Mires,  MD Pharmacy:   [facility] [phone] - [place], [place] - [address] AT [facility] [address] [place] [address] Phone: [phone] Fax: [phone]     Social Determinants of Health (SDOH) Interventions    Readmission Risk Interventions No flowsheet data found.

## 2019-07-13 NOTE — ED Notes (Signed)
This RN messaged admitting MD in regards to pt's home meds, metformin and zocor.

## 2019-07-13 NOTE — ED Notes (Signed)
Pt transported to CT ?

## 2019-07-14 DIAGNOSIS — I4821 Permanent atrial fibrillation: Secondary | ICD-10-CM

## 2019-07-14 LAB — BASIC METABOLIC PANEL
Anion gap: 11 (ref 5–15)
BUN: 17 mg/dL (ref 8–23)
CO2: 26 mmol/L (ref 22–32)
Calcium: 9.4 mg/dL (ref 8.9–10.3)
Chloride: 100 mmol/L (ref 98–111)
Creatinine, Ser: 0.99 mg/dL (ref 0.61–1.24)
GFR calc Af Amer: >60 mL/min (ref >60)
GFR calc non Af Amer: >60 mL/min (ref >60)
Glucose, Bld: 110 mg/dL — ABNORMAL HIGH (ref 70–99)
Potassium: 3.9 mmol/L (ref 3.5–5.1)
Sodium: 137 mmol/L (ref 135–145)

## 2019-07-14 LAB — HIV ANTIBODY (ROUTINE TESTING W REFLEX): HIV Screen 4th Generation wRfx: NONREACTIVE

## 2019-07-14 MED ORDER — FUROSEMIDE 40 MG PO TABS
40.0000 mg | ORAL_TABLET | Freq: Two times a day (BID) | ORAL | Status: DC
  Administered 2019-07-14: 40 mg via ORAL
  Filled 2019-07-14: qty 1

## 2019-07-14 NOTE — Progress Notes (Signed)
Progress Note  Patient Name: [name] Date of Encounter: 07/14/2019  [facility] Cardiologist: Verne Carrow, MD   Subjective   Patient is feeling much better compared to admission.  No extremity swelling shortness of breath is improved.  Inpatient Medications    Scheduled Meds: . apixaban  5 mg Oral BID  . aspirin EC  81 mg Oral Daily  . furosemide  40 mg Intravenous BID  . loratadine  10 mg Oral Daily  . multivitamin with minerals  1 tablet Oral Daily  . omega-3 acid ethyl esters  1 g Oral Daily  . sacubitril-valsartan  1 tablet Oral BID  . simvastatin  40 mg Oral Daily   Continuous Infusions: . sodium chloride     PRN Meds: sodium chloride, acetaminophen, ALPRAZolam, ondansetron (ZOFRAN) IV, zolpidem   Vital Signs    Vitals:   07/14/19 0400 07/14/19 0421 07/14/19 0747 07/14/19 1151  BP:  91/78 111/78 99/73  Pulse:  92 71 62  Resp: 19  18 18   Temp:  (!) 97.5 F (36.4 C) 97.6 F (36.4 C)   TempSrc:  Oral    SpO2:  100% 100% 100%  Weight:  73.4 kg    Height:        Intake/Output Summary (Last 24 hours) at 07/14/2019 1306 Last data filed at 07/14/2019 1248 Gross per 24 hour  Intake --  Output 300 ml  Net -300 ml   Last 3 Weights 07/14/2019 07/13/2019 07/12/2019  Weight (lbs) 161 lb 14.4 oz 157 lb 160 lb  Weight (kg) 73.437 kg 71.215 kg 72.576 kg      Telemetry    Atrial fibrillation- Personally Reviewed  ECG    No new tracing- Personally Reviewed  Physical Exam   GEN: No acute distress.   Neck: No JVD Cardiac: Irregularly irregular Respiratory: Clear to auscultation bilaterally. GI: Soft, nontender, non-distended  MS: No edema; right arm and right leg weakness Neuro:   Right arm and right leg weakness, slurred speech Psych: Normal affect   Labs    High Sensitivity Troponin:   Recent Labs  Lab 07/12/19 1913 07/13/19 1419  TROPONINIHS 23* 23*      Chemistry Recent Labs  Lab 07/12/19 1913 07/13/19 1419 07/14/19 0557  NA  135 138 137  K 4.3 3.6 3.9  CL 101 101 100  CO2 23 26 26   GLUCOSE 55* 93 110*  BUN 16 15 17   CREATININE 1.17 1.00 0.99  CALCIUM 9.6 9.6 9.4  PROT 7.1  --   --   ALBUMIN 3.8  --   --   AST 39  --   --   ALT 18  --   --   ALKPHOS 157*  --   --   BILITOT 2.2*  --   --   GFRNONAA >60 >60 >60  GFRAA >60 >60 >60  ANIONGAP 11 11 11      Hematology Recent Labs  Lab 07/12/19 1913 07/13/19 1419  WBC 4.8 5.2  RBC 4.05* 4.30  HGB 12.5* 13.2  HCT 36.7* 38.1*  MCV 90.6 88.6  MCH 30.9 30.7  MCHC 34.1 34.6  RDW 17.3* 17.7*  PLT 172 187    BNP Recent Labs  Lab 07/12/19 1913  BNP 1,991.9*     DDimer No results for input(s): DDIMER in the last 168 hours.   Radiology    DG Chest 2 View  Result Date: 07/12/2019 CLINICAL DATA:  Shortness of breath EXAM: CHEST - 2 VIEW COMPARISON:  None.  FINDINGS: The heart is mildly enlarged. Vascular calcifications are seen in the aortic arch. There is a small right pleural effusion with associated atelectasis. The left lung is clear without pleural effusion. There is no pneumothorax. The visualized skeletal structures are unremarkable. IMPRESSION: Small right pleural effusion with associated atelectasis. Electronically Signed   By: Romona Curls M.D.   On: 07/12/2019 19:33   CT Angio Chest PE W and/or Wo Contrast  Result Date: 07/13/2019 CLINICAL DATA:  Shortness of breath and abdominal distension EXAM: CT ANGIOGRAPHY CHEST CT ABDOMEN AND PELVIS WITH CONTRAST TECHNIQUE: Multidetector CT imaging of the chest was performed using the standard protocol during bolus administration of intravenous contrast. Multiplanar CT image reconstructions and MIPs were obtained to evaluate the vascular anatomy. Multidetector CT imaging of the abdomen and pelvis was performed using the standard protocol during bolus administration of intravenous contrast. CONTRAST:  OMNIPAQUE IOHEXOL 350 MG/ML SOLN COMPARISON:  None. FINDINGS: CTA CHEST FINDINGS Cardiovascular:  Thoracic aorta shows no aneurysmal dilatation. No significant opacification is noted to rule out dissection. Mild are aortic calcifications are noted. The pulmonary artery shows a normal branching pattern without filling defect to suggest pulmonary embolism. Coronary calcifications are noted. Heart is enlarged in size. No pericardial effusion is seen. Mediastinum/Nodes: Thoracic inlet is within normal limits. No hilar or mediastinal adenopathy is noted. The esophagus as visualized is within normal limits. Lungs/Pleura: Mild right basilar atelectasis is noted. Moderate right-sided pleural effusion is seen. No nodule or pneumothorax is seen. Musculoskeletal: Degenerative changes of the thoracic spine are noted. No acute bony abnormality is seen. Review of the MIP images confirms the above findings. CT ABDOMEN and PELVIS FINDINGS Hepatobiliary: Decreased attenuation of the liver is noted consistent with fatty infiltration. The gallbladder is partially distended. Pancreas: Unremarkable. No pancreatic ductal dilatation or surrounding inflammatory changes. Spleen: Normal in size without focal abnormality. Adrenals/Urinary Tract: Adrenal glands are within normal limits. Kidneys are well visualized bilaterally. No renal calculi or obstructive changes are seen. Scarring is noted in the left kidney. Normal excretion of contrast material is noted. The bladder is well distended. Stomach/Bowel: Appendix is well visualized and demonstrates multiple small appendicoliths within. No inflammatory changes are seen to suggest appendicitis. Small bowel and stomach are within normal limits. Vascular/Lymphatic: Aortic atherosclerosis. No enlarged abdominal or pelvic lymph nodes. Reproductive: Prostate is unremarkable. Other: No hernia is identified.  Mild ascites is noted. Musculoskeletal: No acute or significant osseous findings. Review of the MIP images confirms the above findings. IMPRESSION: CTA of the chest: No evidence of pulmonary  embolism. Mild right basilar atelectasis with associated right-sided effusion. CT of the abdomen and pelvis: Fatty infiltration of the liver. Mild ascites. No other focal abnormality is noted. Electronically Signed   By: Alcide Clever M.D.   On: 07/13/2019 00:53   CT ABDOMEN PELVIS W CONTRAST  Result Date: 07/13/2019 CLINICAL DATA:  Shortness of breath and abdominal distension EXAM: CT ANGIOGRAPHY CHEST CT ABDOMEN AND PELVIS WITH CONTRAST TECHNIQUE: Multidetector CT imaging of the chest was performed using the standard protocol during bolus administration of intravenous contrast. Multiplanar CT image reconstructions and MIPs were obtained to evaluate the vascular anatomy. Multidetector CT imaging of the abdomen and pelvis was performed using the standard protocol during bolus administration of intravenous contrast. CONTRAST:  OMNIPAQUE IOHEXOL 350 MG/ML SOLN COMPARISON:  None. FINDINGS: CTA CHEST FINDINGS Cardiovascular: Thoracic aorta shows no aneurysmal dilatation. No significant opacification is noted to rule out dissection. Mild are aortic calcifications are noted. The pulmonary  artery shows a normal branching pattern without filling defect to suggest pulmonary embolism. Coronary calcifications are noted. Heart is enlarged in size. No pericardial effusion is seen. Mediastinum/Nodes: Thoracic inlet is within normal limits. No hilar or mediastinal adenopathy is noted. The esophagus as visualized is within normal limits. Lungs/Pleura: Mild right basilar atelectasis is noted. Moderate right-sided pleural effusion is seen. No nodule or pneumothorax is seen. Musculoskeletal: Degenerative changes of the thoracic spine are noted. No acute bony abnormality is seen. Review of the MIP images confirms the above findings. CT ABDOMEN and PELVIS FINDINGS Hepatobiliary: Decreased attenuation of the liver is noted consistent with fatty infiltration. The gallbladder is partially distended. Pancreas: Unremarkable. No  pancreatic ductal dilatation or surrounding inflammatory changes. Spleen: Normal in size without focal abnormality. Adrenals/Urinary Tract: Adrenal glands are within normal limits. Kidneys are well visualized bilaterally. No renal calculi or obstructive changes are seen. Scarring is noted in the left kidney. Normal excretion of contrast material is noted. The bladder is well distended. Stomach/Bowel: Appendix is well visualized and demonstrates multiple small appendicoliths within. No inflammatory changes are seen to suggest appendicitis. Small bowel and stomach are within normal limits. Vascular/Lymphatic: Aortic atherosclerosis. No enlarged abdominal or pelvic lymph nodes. Reproductive: Prostate is unremarkable. Other: No hernia is identified.  Mild ascites is noted. Musculoskeletal: No acute or significant osseous findings. Review of the MIP images confirms the above findings. IMPRESSION: CTA of the chest: No evidence of pulmonary embolism. Mild right basilar atelectasis with associated right-sided effusion. CT of the abdomen and pelvis: Fatty infiltration of the liver. Mild ascites. No other focal abnormality is noted. Electronically Signed   By: Alcide Clever M.D.   On: 07/13/2019 00:53    Cardiac Studies   Echo 05/07/19 1. LVEF is severely epressed with diffuse hypokinesis, worse in the  septal, apical, anterior and inferior walls There is basal inferior  akinesis. Compared to echo report of 2018, LVEF is worse.. Left  ventricular ejection fraction, by estimation, is 20  to 25%. There is moderate left ventricular hypertrophy. Left ventricular  diastolic parameters are indeterminate.  2. Right ventricular systolic function is mildly reduced. The right  ventricular size is normal. There is mildly elevated pulmonary artery  systolic pressure.  3. Left atrial size was severely dilated.  4. Right atrial size was severely dilated.  5. The mitral valve is abnormal. Mild mitral valve  regurgitation.  6. Tricuspid valve regurgitation is moderate.  7. The aortic valve is normal in structure. Aortic valve regurgitation is  not visualized.   Patient Profile     64 y.o. male history of permanent atrial fibrillation on Eliquis, nonischemic cardiomyopathy, last EF 20 to 25%, stroke complicated by expressive aphasia, right-sided weakness, hypertension presenting with shortness of breath and lower extremity edema being seen due to chronic heart failure and atrial fibrillation.  Assessment & Plan    1. NICM EF 20-25% -Lungs appear clear, no edema on legs -Switch IV Lasix to p.o. 40 mg twice daily. -Continue Entresto -Holding beta-blocker due to low normal blood pressures.  Restart as BP permits.  2.  Permanent A. Fib -Heart rate controlled beta-blocker -Continue Eliquis -Beta-blocker as blood pressure permits  3.  Hyperlipidemia -Statin      Signed, Debbe Odea, MD  07/14/2019, 1:06 PM

## 2019-07-14 NOTE — Progress Notes (Signed)
PROGRESS NOTE    [name]    Code Status: Full Code  YOV:[id number] DOB: 01/21/1955 DOA: 07/12/2019 LOS: 1 days  PCP: Mirna Mires, MD CC:  Chief Complaint  Patient presents with  . 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 Admitted for systolic heart failure exacerbation and started on IV lasix. Cardiology was consulted and carvedilol was held and [facility] was decreased due to soft BPs.     A & P   Active Problems:   Acute CHF (congestive heart failure) (HCC)   Acute on chronic HFrEF (heart failure with reduced ejection fraction) (HCC)   Permanent atrial fibrillation (HCC)   1. Acute on chronic systolic heart failure exacerbation in setting of NICM a. EF 20-25% on 05/07/19 b. Cardio on board: switch Lasix to PO today c. Continue Entresto d. Beta blocker on hold due to soft BP and will restart as BP permits  2. Permanent A. Fib, rate controlled a. Continue Eliquis b. Beta blocker as above  3. Hyperlipidemia on statin  4. Hypertension a. Coreg on hold b. Continue Entresto  5. 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 Brother reports patient seemed weaker today than normal but no new findings b. PT eval  DVT prophylaxis:  apixaban (ELIQUIS) tablet 5 mg   Family Communication: Patient's brother has been updated   Disposition Plan:  Status is: Inpatient  Remains inpatient appropriate because:Inpatient level of care appropriate due to severity of illness   Dispo: The patient is from: Home              Anticipated d/c is to: TBD              Anticipated d/c date is: 1 day              Patient currently is not  medically stable to d/c.          Pressure injury documentation    None  Consultants  Cardiology  Procedures  None  Antibiotics   Anti-infectives (From admission, onward)   None        Subjective   Seen and examined at bedside. Brother at bedside reports patient seemed weaker than normal on his right side but no other new issues, seems to be at baseline otherwise. Patient has expressive aphasia but was able to say yes/no when prompted. No other issues/complaints.  Objective   Vitals:   07/14/19 0400 07/14/19 0421 07/14/19 0747 07/14/19 1151  BP:  91/78 111/78 99/73  Pulse:  92 71 62  Resp: 19  18 18   Temp:  (!) 97.5 F (36.4 C) 97.6 F (36.4 C)   TempSrc:  Oral    SpO2:  100% 100% 100%  Weight:  73.4 kg    Height:        Intake/Output Summary (Last 24 hours) at 07/14/2019 1325 Last data filed at 07/14/2019 1248 Gross per 24 hour  Intake --  Output 300 ml  Net -300 ml   Filed Weights   07/12/19 1905 07/13/19 2327 07/14/19 0421  Weight: 72.6 kg 71.2 kg 73.4 kg    Examination:  Physical Exam Vitals and  nursing note reviewed.  Constitutional:      Appearance: Normal appearance.  HENT:     Head: Normocephalic and atraumatic.  Eyes:     Conjunctiva/sclera: Conjunctivae normal.  Cardiovascular:     Rate and Rhythm: Normal rate. Rhythm irregular.  Pulmonary:     Effort: Pulmonary effort is normal.     Breath sounds: Normal breath sounds.  Abdominal:     General: Abdomen is flat.     Palpations: Abdomen is soft.  Musculoskeletal:        General: No swelling or tenderness.     Comments: 1+ lower extremity pitting edema with compression stockings on, trace edema noted in posterior thigh  RUE/RLE 4/5 muscle strength, LUE/LLE 5/5 muscle strength  Skin:    Coloration: Skin is not jaundiced or pale.  Neurological:     Mental Status: He is alert. Mental status is at baseline.  Psychiatric:        Mood and Affect: Mood normal.        Behavior:  Behavior normal.     Data Reviewed: I have personally reviewed following labs and imaging studies  CBC: Recent Labs  Lab 07/12/19 1913 07/13/19 1419  WBC 4.8 5.2  HGB 12.5* 13.2  HCT 36.7* 38.1*  MCV 90.6 88.6  PLT 172 187   Basic Metabolic Panel: Recent Labs  Lab 07/12/19 1913 07/13/19 1419 07/14/19 0557  NA 135 138 137  K 4.3 3.6 3.9  CL 101 101 100  CO2 23 26 26   GLUCOSE 55* 93 110*  BUN 16 15 17   CREATININE 1.17 1.00 0.99  CALCIUM 9.6 9.6 9.4  MG  --  1.7  --    GFR: Estimated Creatinine Clearance: 79.3 mL/min (by C-G formula based on SCr of 0.99 mg/dL). Liver Function Tests: Recent Labs  Lab 07/12/19 1913  AST 39  ALT 18  ALKPHOS 157*  BILITOT 2.2*  PROT 7.1  ALBUMIN 3.8   Recent Labs  Lab 07/12/19 1913  LIPASE 75*   No results for input(s): AMMONIA in the last 168 hours. Coagulation Profile: Recent Labs  Lab 07/13/19 0021  INR 1.7*   Cardiac Enzymes: No results for input(s): CKTOTAL, CKMB, CKMBINDEX, TROPONINI in the last 168 hours. BNP (last 3 results) No results for input(s): PROBNP in the last 8760 hours. HbA1C: No results for input(s): HGBA1C in the last 72 hours. CBG: Recent Labs  Lab 07/12/19 2358 07/13/19 0124 07/13/19 0624 07/13/19 2204  GLUCAP 61* 121* 71 99   Lipid Profile: No results for input(s): CHOL, HDL, LDLCALC, TRIG, CHOLHDL, LDLDIRECT in the last 72 hours. Thyroid Function Tests: Recent Labs    07/13/19 1419  TSH 4.196   Anemia Panel: No results for input(s): VITAMINB12, FOLATE, FERRITIN, TIBC, IRON, RETICCTPCT in the last 72 hours. Sepsis Labs: No results for input(s): PROCALCITON, LATICACIDVEN in the last 168 hours.  Recent Results (from the past 240 hour(s))  SARS Coronavirus 2 by RT PCR (hospital order, performed in [facility] hospital lab) Nasopharyngeal Nasopharyngeal Swab     Status: None   Collection Time: 07/13/19 12:21 AM   Specimen: Nasopharyngeal Swab  Result Value Ref Range Status   SARS  Coronavirus 2 NEGATIVE NEGATIVE Final    Comment: (NOTE) SARS-CoV-2 target nucleic acids are NOT DETECTED.  The SARS-CoV-2 RNA is generally detectable in upper and lower respiratory specimens during the acute phase of infection. The lowest concentration of SARS-CoV-2 viral copies this assay can detect is 250 copies / mL. A negative result  does not preclude SARS-CoV-2 infection and should not be used as the sole basis for treatment or other patient management decisions.  A negative result may occur with improper specimen collection / handling, submission of specimen other than nasopharyngeal swab, presence of viral mutation(s) within the areas targeted by this assay, and inadequate number of viral copies (<250 copies / mL). A negative result must be combined with clinical observations, patient history, and epidemiological information.  Fact Sheet for Patients:   BoilerBrush.com.cy  Fact Sheet for Healthcare Providers: https://pope.com/  This test is not yet approved or  cleared by the [place] FDA and has been authorized for detection and/or diagnosis of SARS-CoV-2 by FDA under an Emergency Use Authorization (EUA).  This EUA will remain in effect (meaning this test can be used) for the duration of the COVID-19 declaration under Section 564(b)(1) of the Act, 21 U.S.C. section 360bbb-3(b)(1), unless the authorization is terminated or revoked sooner.  Performed at [facility], [address] [place], [place] [address]          Radiology Studies: DG Chest 2 View  Result Date: 07/12/2019 CLINICAL DATA:  Shortness of breath EXAM: CHEST - 2 VIEW COMPARISON:  None. FINDINGS: The heart is mildly enlarged. Vascular calcifications are seen in the aortic arch. There is a small right pleural effusion with associated atelectasis. The left lung is clear without pleural effusion. There is no pneumothorax. The visualized skeletal  structures are unremarkable. IMPRESSION: Small right pleural effusion with associated atelectasis. Electronically Signed   By: Romona Curls M.D.   On: 07/12/2019 19:33   CT Angio Chest PE W and/or Wo Contrast  Result Date: 07/13/2019 CLINICAL DATA:  Shortness of breath and abdominal distension EXAM: CT ANGIOGRAPHY CHEST CT ABDOMEN AND PELVIS WITH CONTRAST TECHNIQUE: Multidetector CT imaging of the chest was performed using the standard protocol during bolus administration of intravenous contrast. Multiplanar CT image reconstructions and MIPs were obtained to evaluate the vascular anatomy. Multidetector CT imaging of the abdomen and pelvis was performed using the standard protocol during bolus administration of intravenous contrast. CONTRAST:  OMNIPAQUE IOHEXOL 350 MG/ML SOLN COMPARISON:  None. FINDINGS: CTA CHEST FINDINGS Cardiovascular: Thoracic aorta shows no aneurysmal dilatation. No significant opacification is noted to rule out dissection. Mild are aortic calcifications are noted. The pulmonary artery shows a normal branching pattern without filling defect to suggest pulmonary embolism. Coronary calcifications are noted. Heart is enlarged in size. No pericardial effusion is seen. Mediastinum/Nodes: Thoracic inlet is within normal limits. No hilar or mediastinal adenopathy is noted. The esophagus as visualized is within normal limits. Lungs/Pleura: Mild right basilar atelectasis is noted. Moderate right-sided pleural effusion is seen. No nodule or pneumothorax is seen. Musculoskeletal: Degenerative changes of the thoracic spine are noted. No acute bony abnormality is seen. Review of the MIP images confirms the above findings. CT ABDOMEN and PELVIS FINDINGS Hepatobiliary: Decreased attenuation of the liver is noted consistent with fatty infiltration. The gallbladder is partially distended. Pancreas: Unremarkable. No pancreatic ductal dilatation or surrounding inflammatory changes. Spleen: Normal in size  without focal abnormality. Adrenals/Urinary Tract: Adrenal glands are within normal limits. Kidneys are well visualized bilaterally. No renal calculi or obstructive changes are seen. Scarring is noted in the left kidney. Normal excretion of contrast material is noted. The bladder is well distended. Stomach/Bowel: Appendix is well visualized and demonstrates multiple small appendicoliths within. No inflammatory changes are seen to suggest appendicitis. Small bowel and stomach are within normal limits. Vascular/Lymphatic: Aortic atherosclerosis. No enlarged  abdominal or pelvic lymph nodes. Reproductive: Prostate is unremarkable. Other: No hernia is identified.  Mild ascites is noted. Musculoskeletal: No acute or significant osseous findings. Review of the MIP images confirms the above findings. IMPRESSION: CTA of the chest: No evidence of pulmonary embolism. Mild right basilar atelectasis with associated right-sided effusion. CT of the abdomen and pelvis: Fatty infiltration of the liver. Mild ascites. No other focal abnormality is noted. Electronically Signed   By: Alcide Clever M.D.   On: 07/13/2019 00:53   CT ABDOMEN PELVIS W CONTRAST  Result Date: 07/13/2019 CLINICAL DATA:  Shortness of breath and abdominal distension EXAM: CT ANGIOGRAPHY CHEST CT ABDOMEN AND PELVIS WITH CONTRAST TECHNIQUE: Multidetector CT imaging of the chest was performed using the standard protocol during bolus administration of intravenous contrast. Multiplanar CT image reconstructions and MIPs were obtained to evaluate the vascular anatomy. Multidetector CT imaging of the abdomen and pelvis was performed using the standard protocol during bolus administration of intravenous contrast. CONTRAST:  OMNIPAQUE IOHEXOL 350 MG/ML SOLN COMPARISON:  None. FINDINGS: CTA CHEST FINDINGS Cardiovascular: Thoracic aorta shows no aneurysmal dilatation. No significant opacification is noted to rule out dissection. Mild are aortic calcifications are  noted. The pulmonary artery shows a normal branching pattern without filling defect to suggest pulmonary embolism. Coronary calcifications are noted. Heart is enlarged in size. No pericardial effusion is seen. Mediastinum/Nodes: Thoracic inlet is within normal limits. No hilar or mediastinal adenopathy is noted. The esophagus as visualized is within normal limits. Lungs/Pleura: Mild right basilar atelectasis is noted. Moderate right-sided pleural effusion is seen. No nodule or pneumothorax is seen. Musculoskeletal: Degenerative changes of the thoracic spine are noted. No acute bony abnormality is seen. Review of the MIP images confirms the above findings. CT ABDOMEN and PELVIS FINDINGS Hepatobiliary: Decreased attenuation of the liver is noted consistent with fatty infiltration. The gallbladder is partially distended. Pancreas: Unremarkable. No pancreatic ductal dilatation or surrounding inflammatory changes. Spleen: Normal in size without focal abnormality. Adrenals/Urinary Tract: Adrenal glands are within normal limits. Kidneys are well visualized bilaterally. No renal calculi or obstructive changes are seen. Scarring is noted in the left kidney. Normal excretion of contrast material is noted. The bladder is well distended. Stomach/Bowel: Appendix is well visualized and demonstrates multiple small appendicoliths within. No inflammatory changes are seen to suggest appendicitis. Small bowel and stomach are within normal limits. Vascular/Lymphatic: Aortic atherosclerosis. No enlarged abdominal or pelvic lymph nodes. Reproductive: Prostate is unremarkable. Other: No hernia is identified.  Mild ascites is noted. Musculoskeletal: No acute or significant osseous findings. Review of the MIP images confirms the above findings. IMPRESSION: CTA of the chest: No evidence of pulmonary embolism. Mild right basilar atelectasis with associated right-sided effusion. CT of the abdomen and pelvis: Fatty infiltration of the liver.  Mild ascites. No other focal abnormality is noted. Electronically Signed   By: Alcide Clever M.D.   On: 07/13/2019 00:53        Scheduled Meds: . apixaban  5 mg Oral BID  . aspirin EC  81 mg Oral Daily  . furosemide  40 mg Oral BID  . loratadine  10 mg Oral Daily  . multivitamin with minerals  1 tablet Oral Daily  . omega-3 acid ethyl esters  1 g Oral Daily  . sacubitril-valsartan  1 tablet Oral BID  . simvastatin  40 mg Oral Daily   Continuous Infusions: . sodium chloride       Time spent: 33 minutes with over 50% of the time coordinating  the patient's care    Harold Hedge, DO [facility] Pager [phone]  Call night coverage person covering after 7pm

## 2019-07-15 DIAGNOSIS — I420 Dilated cardiomyopathy: Secondary | ICD-10-CM

## 2019-07-15 LAB — BASIC METABOLIC PANEL
Anion gap: 12 (ref 5–15)
Anion gap: 9 (ref 5–15)
BUN: 19 mg/dL (ref 8–23)
BUN: 20 mg/dL (ref 8–23)
CO2: 25 mmol/L (ref 22–32)
CO2: 29 mmol/L (ref 22–32)
Calcium: 9.4 mg/dL (ref 8.9–10.3)
Calcium: 9.2 mg/dL (ref 8.9–10.3)
Chloride: 100 mmol/L (ref 98–111)
Chloride: 99 mmol/L (ref 98–111)
Creatinine, Ser: 0.95 mg/dL (ref 0.61–1.24)
Creatinine, Ser: 1.09 mg/dL (ref 0.61–1.24)
GFR calc Af Amer: >60 mL/min (ref >60)
GFR calc Af Amer: >60 mL/min (ref >60)
GFR calc non Af Amer: >60 mL/min (ref >60)
GFR calc non Af Amer: >60 mL/min (ref >60)
Glucose, Bld: 121 mg/dL — ABNORMAL HIGH (ref 70–99)
Glucose, Bld: 192 mg/dL — ABNORMAL HIGH (ref 70–99)
Potassium: 3.6 mmol/L (ref 3.5–5.1)
Potassium: 3.9 mmol/L (ref 3.5–5.1)
Sodium: 137 mmol/L (ref 135–145)
Sodium: 137 mmol/L (ref 135–145)

## 2019-07-15 LAB — MAGNESIUM: Magnesium: 1.9 mg/dL (ref 1.7–2.4)

## 2019-07-15 MED ORDER — LACTATED RINGERS IV BOLUS
250.0000 mL | Freq: Once | INTRAVENOUS | Status: AC
  Administered 2019-07-15: 250 mL via INTRAVENOUS

## 2019-07-15 MED ORDER — FUROSEMIDE 10 MG/ML IJ SOLN
40.0000 mg | Freq: Three times a day (TID) | INTRAMUSCULAR | Status: DC
  Administered 2019-07-15 – 2019-07-17 (×7): 40 mg via INTRAVENOUS
  Filled 2019-07-15 (×7): qty 4

## 2019-07-15 NOTE — Evaluation (Signed)
Physical Therapy Evaluation Patient Details Name: [name] MRN: [id number] DOB: October 02, 1955 Today's Date: 07/15/2019   History of Present Illness  Per MD notes: Pt is a 64 y.o. African-American male with a known history of CHF, atrial flutter, type 2 diabetes mellitus, hypertension and nonischemic cardiomyopathy as well as CVA with residual expressive aphasia and R-sided weakness, who presented to the emergency room with acute onset of worsening dyspnea with associated orthopnea and lower extremity edema as well as abdominal distension, cough which has been mainly dry as well as wheezing.   MD assessment includes: Acute on chronic systolic heart failure exacerbation in setting of NICM, A-fib, and HTN.    Clinical Impression  Pt with significant expressive aphasia and unable to provide any history or answer any questions other than occasional yes/no questions.  History obtained from niece via phone call.  Pt required extra time and effort with bed mobility and transfers and was unable to come to standing without the EOB raised.  Pt was very limited with amb with a SPC and required min A to prevent LOB most notably while in RLE single leg stance phase.  Per the pt's niece the pt was able to amb in the community with a SPC prior to admission, nursing notified.  Pt is currently at very high risk for falls and would not be safe to return to his prior living situation at this time.  Pt will benefit from PT services in a SNF setting upon discharge to safely address deficits listed in patient problem list for decreased caregiver assistance and eventual return to PLOF.     Follow Up Recommendations SNF    Equipment Recommendations  None recommended by PT    Recommendations for Other Services       Precautions / Restrictions Restrictions Weight Bearing Restrictions: No      Mobility  Bed Mobility Overal bed mobility: Modified Independent Bed Mobility: Supine to Sit;Sit to Supine      Supine to sit: Modified independent (Device/Increase time) Sit to supine: Modified independent (Device/Increase time)   General bed mobility comments: Extra time and effort only  Transfers Overall transfer level: Needs assistance Equipment used: Straight cane Transfers: Sit to/from Stand Sit to Stand: Min guard;From elevated surface         General transfer comment: Multiple rocking attempts to stand with pt unable to stand without the EOB in elevated position  Ambulation/Gait Ambulation/Gait assistance: Min assist Gait Distance (Feet): 5 Feet Assistive device: Straight cane Gait Pattern/deviations: Step-to pattern;Decreased stance time - right;Decreased step length - left Gait velocity: decreased   General Gait Details: Pt required min A to prevent LOB during limited amb with pt presenting with difficulty maintaining stability during RLE SLS time  Stairs            Wheelchair Mobility    Modified Rankin (Stroke Patients Only)       Balance Overall balance assessment: Needs assistance Sitting-balance support: Single extremity supported Sitting balance-Leahy Scale: Good     Standing balance support: Single extremity supported;During functional activity Standing balance-Leahy Scale: Poor Standing balance comment: Min A for stability during amb                             Pertinent Vitals/Pain Pain Assessment: No/denies pain    Home Living Family/patient expects to be discharged to:: Private residence Living Arrangements: Other relatives Available Help at Discharge: Family;Available 24 hours/day Type of Home: House Home  Access: Ramped entrance     Home Layout: One level Home Equipment: Cane - single point;Walker - 2 wheels;Shower seat - built in;Bedside commode Additional Comments: All history and PLOF provided by niece via phone call secondary to pt's expressive aphasia; pt has two nieces that alternate staying with the pt    Prior Function  Level of Independence: Needs assistance   Gait / Transfers Assistance Needed: Mod Ind amb with a SPC community distances, no fall history; Ind with dressing and bathing  ADL's / Homemaking Assistance Needed: Nieces assist with meals, meds, and transportation        Hand Dominance        Extremity/Trunk Assessment   Upper Extremity Assessment Upper Extremity Assessment: Generalized weakness;RUE deficits/detail RUE Deficits / Details: pt with h/o R-sided weakness from prior CVA    Lower Extremity Assessment Lower Extremity Assessment: Generalized weakness;RLE deficits/detail RLE Deficits / Details: pt with h/o R-sided weakness from prior CVA       Communication   Communication: Expressive difficulties  Cognition Arousal/Alertness: Awake/alert Behavior During Therapy: WFL for tasks assessed/performed Overall Cognitive Status: Difficult to assess                                        General Comments      Exercises Total Joint Exercises Heel Slides: AAROM;Both;10 reps Hip ABduction/ADduction: AAROM;Both;10 reps Straight Leg Raises: AAROM;Both;10 reps Long Arc Quad: AROM;AAROM;Both;10 reps (AAROM on the RLE) Marching in Standing: AROM;Right;10 reps;Standing Other Exercises Other Exercises: Anterior weight shifting in sitting at EOB for improved transfer performance Other Exercises: Static standing balance training at EOB with SPC for support   Assessment/Plan    PT Assessment Patient needs continued PT services  PT Problem List Decreased strength;Decreased activity tolerance;Decreased balance;Decreased mobility       PT Treatment Interventions DME instruction;Gait training;Functional mobility training;Therapeutic activities;Therapeutic exercise;Balance training;Patient/family education    PT Goals (Current goals can be found in the Care Plan section)  Acute Rehab PT Goals PT Goal Formulation: Patient unable to participate in goal setting Time  For Goal Achievement: 07/28/19 Potential to Achieve Goals: Good    Frequency Min 2X/week   Barriers to discharge Inaccessible home environment      Co-evaluation               AM-PAC PT "6 Clicks" Mobility  Outcome Measure Help needed turning from your back to your side while in a flat bed without using bedrails?: A Little Help needed moving from lying on your back to sitting on the side of a flat bed without using bedrails?: A Little Help needed moving to and from a bed to a chair (including a wheelchair)?: A Little Help needed standing up from a chair using your arms (e.g., wheelchair or bedside chair)?: A Little Help needed to walk in hospital room?: A Lot Help needed climbing 3-5 steps with a railing? : Total 6 Click Score: 15    End of Session Equipment Utilized During Treatment: Gait belt Activity Tolerance: Patient tolerated treatment well Patient left: in bed;with call bell/phone within reach;with bed alarm set Nurse Communication: Mobility status;Other (comment) (Pt presenting with significant deficit in RLE standing tolerance compared to baseline) PT Visit Diagnosis: Unsteadiness on feet (R26.81);Difficulty in walking, not elsewhere classified (R26.2);Muscle weakness (generalized) (M62.81)    Time: 4098-1191 PT Time Calculation (min) (ACUTE ONLY): 39 min   Charges:   PT Evaluation $  PT Eval Moderate Complexity: 1 Mod PT Treatments $Therapeutic Exercise: 8-22 mins        D. Elly Modena PT, DPT 07/15/19, 5:50 PM

## 2019-07-15 NOTE — Progress Notes (Signed)
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 Admitted for systolic heart failure exacerbation and started on IV lasix. Cardiology was consulted and carvedilol was held and [facility] was decreased due to soft BPs.     A & P   Active Problems:   Acute CHF (congestive heart failure) (HCC)   Acute on chronic HFrEF (heart failure with reduced ejection fraction) (HCC)   Permanent atrial fibrillation (HCC)   1. Acute on chronic systolic heart failure exacerbation in setting of NICM a. EF 20-25% on 05/07/19 b. Cardio on board: volume overloaded today give IV lasix, may need to be transitioned to torsemide rather than PO lasix c. Continue Entresto d. Beta blocker on hold due to soft BP  2. Permanent A. Fib, rate controlled a. Continue Eliquis b. Beta blocker as above  3. Hyperlipidemia on statin  4. Hypertension a. Coreg on hold b. Continue Entresto  5. 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 PT eval  DVT prophylaxis:  apixaban (ELIQUIS) tablet 5 mg   Family Communication: Patient's brother has been updated yesterday  Disposition Plan:  Status is: Inpatient  Remains inpatient appropriate because:Inpatient level of care appropriate due to severity of illness   Dispo: The patient is from: Home              Anticipated d/c is to: TBD              Anticipated d/c date is: 1 day              Patient currently is not medically stable to  d/c.          Pressure injury documentation    None  Consultants  Cardiology  Procedures  None  Antibiotics   Anti-infectives (From admission, onward)   None        Subjective   Patient seen and examined today in no acute distress and resting comfortably. No complaints. No overnight events. Patient is a poor historian given his underlying conditions.  Objective   Vitals:   07/14/19 1703 07/14/19 2013 07/15/19 0438 07/15/19 0804  BP: 109/79 119/83 106/79 91/76  Pulse: (!) 58 (!) 53 66 69  Resp: 18   18  Temp:  (!) 97.5 F (36.4 C) 98 F (36.7 C) 97.8 F (36.6 C)  TempSrc:  Oral Oral Oral  SpO2: 100% 100% 99% 100%  Weight:   70.6 kg   Height:        Intake/Output Summary (Last 24 hours) at 07/15/2019 1425 Last data filed at 07/15/2019 1000 Gross per 24 hour  Intake 480 ml  Output 580 ml  Net -100 ml   Filed Weights   07/13/19 2327 07/14/19 0421 07/15/19 0438  Weight: 71.2 kg 73.4 kg 70.6 kg    Examination:  Physical Exam Vitals and nursing note reviewed.  Constitutional:      Appearance: Normal appearance.  HENT:  Head: Normocephalic and atraumatic.  Eyes:     Conjunctiva/sclera: Conjunctivae normal.  Cardiovascular:     Rate and Rhythm: Normal rate. Rhythm irregular.  Pulmonary:     Effort: Pulmonary effort is normal.     Breath sounds: Normal breath sounds. No rales.  Abdominal:     General: Abdomen is flat.     Comments: Distended abdomen  Musculoskeletal:        General: No swelling or tenderness.     Right lower leg: Edema present.     Left lower leg: Edema present.     Comments: 1 + pitting edema up to posterior thighs  Skin:    Coloration: Skin is not jaundiced or pale.  Neurological:     Mental Status: He is alert. Mental status is at baseline.     Comments: At baseline  Psychiatric:        Mood and Affect: Mood normal.        Behavior: Behavior normal.     Data Reviewed: I have personally reviewed following labs and  imaging studies  CBC: Recent Labs  Lab 07/12/19 1913 07/13/19 1419  WBC 4.8 5.2  HGB 12.5* 13.2  HCT 36.7* 38.1*  MCV 90.6 88.6  PLT 172 187   Basic Metabolic Panel: Recent Labs  Lab 07/12/19 1913 07/13/19 1419 07/14/19 0557 07/15/19 0556  NA 135 138 137 137  K 4.3 3.6 3.9 3.6  CL 101 101 100 100  CO2 23 26 26 25   GLUCOSE 55* 93 110* 121*  BUN 16 15 17 19   CREATININE 1.17 1.00 0.99 0.95  CALCIUM 9.6 9.6 9.4 9.4  MG  --  1.7  --   --    GFR: Estimated Creatinine Clearance: 79.5 mL/min (by C-G formula based on SCr of 0.95 mg/dL). Liver Function Tests: Recent Labs  Lab 07/12/19 1913  AST 39  ALT 18  ALKPHOS 157*  BILITOT 2.2*  PROT 7.1  ALBUMIN 3.8   Recent Labs  Lab 07/12/19 1913  LIPASE 75*   No results for input(s): AMMONIA in the last 168 hours. Coagulation Profile: Recent Labs  Lab 07/13/19 0021  INR 1.7*   Cardiac Enzymes: No results for input(s): CKTOTAL, CKMB, CKMBINDEX, TROPONINI in the last 168 hours. BNP (last 3 results) No results for input(s): PROBNP in the last 8760 hours. HbA1C: No results for input(s): HGBA1C in the last 72 hours. CBG: Recent Labs  Lab 07/12/19 2358 07/13/19 0124 07/13/19 0624 07/13/19 2204  GLUCAP 61* 121* 71 99   Lipid Profile: No results for input(s): CHOL, HDL, LDLCALC, TRIG, CHOLHDL, LDLDIRECT in the last 72 hours. Thyroid Function Tests: Recent Labs    07/13/19 1419  TSH 4.196   Anemia Panel: No results for input(s): VITAMINB12, FOLATE, FERRITIN, TIBC, IRON, RETICCTPCT in the last 72 hours. Sepsis Labs: No results for input(s): PROCALCITON, LATICACIDVEN in the last 168 hours.  Recent Results (from the past 240 hour(s))  SARS Coronavirus 2 by RT PCR (hospital order, performed in [facility] hospital lab) Nasopharyngeal Nasopharyngeal Swab     Status: None   Collection Time: 07/13/19 12:21 AM   Specimen: Nasopharyngeal Swab  Result Value Ref Range Status   SARS Coronavirus 2 NEGATIVE NEGATIVE  Final    Comment: (NOTE) SARS-CoV-2 target nucleic acids are NOT DETECTED.  The SARS-CoV-2 RNA is generally detectable in upper and lower respiratory specimens during the acute phase of infection. The lowest concentration of SARS-CoV-2 viral copies this assay can detect is 250 copies /  mL. A negative result does not preclude SARS-CoV-2 infection and should not be used as the sole basis for treatment or other patient management decisions.  A negative result may occur with improper specimen collection / handling, submission of specimen other than nasopharyngeal swab, presence of viral mutation(s) within the areas targeted by this assay, and inadequate number of viral copies (<250 copies / mL). A negative result must be combined with clinical observations, patient history, and epidemiological information.  Fact Sheet for Patients:   BoilerBrush.com.cy  Fact Sheet for Healthcare Providers: https://pope.com/  This test is not yet approved or  cleared by the [place] FDA and has been authorized for detection and/or diagnosis of SARS-CoV-2 by FDA under an Emergency Use Authorization (EUA).  This EUA will remain in effect (meaning this test can be used) for the duration of the COVID-19 declaration under Section 564(b)(1) of the Act, 21 U.S.C. section 360bbb-3(b)(1), unless the authorization is terminated or revoked sooner.  Performed at [facility], [address], [place], [place] [address]          Radiology Studies: No results found.      Scheduled Meds:  apixaban  5 mg Oral BID   aspirin EC  81 mg Oral Daily   furosemide  40 mg Intravenous Q8H   loratadine  10 mg Oral Daily   multivitamin with minerals  1 tablet Oral Daily   omega-3 acid ethyl esters  1 g Oral Daily   sacubitril-valsartan  1 tablet Oral BID   simvastatin  40 mg Oral Daily   Continuous Infusions:  sodium chloride       Time  spent: 25 minutes with over 50% of the time coordinating the patient's care    Jae Dire, DO [facility] Pager [phone]  Call night coverage person covering after 7pm

## 2019-07-15 NOTE — Progress Notes (Signed)
Progress Note  Patient Name: [name] Date of Encounter: 07/15/2019  [facility] Cardiologist: Verne Carrow, MD  Subjective   Expressive aphasia after stroke, does not know his date of birth or age Requesting heart compress for face Overall has no complaints Does not remember his name but will agree when told his name   Inpatient Medications    Scheduled Meds: . apixaban  5 mg Oral BID  . aspirin EC  81 mg Oral Daily  . furosemide  40 mg Intravenous Q8H  . loratadine  10 mg Oral Daily  . multivitamin with minerals  1 tablet Oral Daily  . omega-3 acid ethyl esters  1 g Oral Daily  . sacubitril-valsartan  1 tablet Oral BID  . simvastatin  40 mg Oral Daily   Continuous Infusions: . sodium chloride     PRN Meds: sodium chloride, acetaminophen, ALPRAZolam, ondansetron (ZOFRAN) IV, zolpidem   Vital Signs    Vitals:   07/14/19 1703 07/14/19 2013 07/15/19 0438 07/15/19 0804  BP: 109/79 119/83 106/79 91/76  Pulse: (!) 58 (!) 53 66 69  Resp: 18   18  Temp:  (!) 97.5 F (36.4 C) 98 F (36.7 C) 97.8 F (36.6 C)  TempSrc:  Oral Oral Oral  SpO2: 100% 100% 99% 100%  Weight:   70.6 kg   Height:        Intake/Output Summary (Last 24 hours) at 07/15/2019 0921 Last data filed at 07/15/2019 0546 Gross per 24 hour  Intake 120 ml  Output 580 ml  Net -460 ml   Last 3 Weights 07/15/2019 07/14/2019 07/13/2019  Weight (lbs) 155 lb 11.2 oz 161 lb 14.4 oz 157 lb  Weight (kg) 70.625 kg 73.437 kg 71.215 kg      Telemetry    Atrial fibrillation- Personally Reviewed  ECG     - Personally Reviewed  Physical Exam   GEN: No acute distress.   Neck: JVD 10+ Cardiac: IRRR, no murmurs, rubs, or gallops.  Respiratory: Clear to auscultation bilaterally, Rales at the bases GI: Soft, nontender, non-distended  MS:  1+ pitting edema below the knees, trace into the posterior thighs; No deformity. Neuro:  full exam not performed Psych: Normal affect   Labs    High  Sensitivity Troponin:   Recent Labs  Lab 07/12/19 1913 07/13/19 1419  TROPONINIHS 23* 23*      Chemistry Recent Labs  Lab 07/12/19 1913 07/12/19 1913 07/13/19 1419 07/14/19 0557 07/15/19 0556  NA 135   < > 138 137 137  K 4.3   < > 3.6 3.9 3.6  CL 101   < > 101 100 100  CO2 23   < > 26 26 25   GLUCOSE 55*   < > 93 110* 121*  BUN 16   < > 15 17 19   CREATININE 1.17   < > 1.00 0.99 0.95  CALCIUM 9.6   < > 9.6 9.4 9.4  PROT 7.1  --   --   --   --   ALBUMIN 3.8  --   --   --   --   AST 39  --   --   --   --   ALT 18  --   --   --   --   ALKPHOS 157*  --   --   --   --   BILITOT 2.2*  --   --   --   --   GFRNONAA >60   < > >  60 >60 >60  GFRAA >60   < > >60 >60 >60  ANIONGAP 11   < > 11 11 12    < > = values in this interval not displayed.     Hematology Recent Labs  Lab 07/12/19 1913 07/13/19 1419  WBC 4.8 5.2  RBC 4.05* 4.30  HGB 12.5* 13.2  HCT 36.7* 38.1*  MCV 90.6 88.6  MCH 30.9 30.7  MCHC 34.1 34.6  RDW 17.3* 17.7*  PLT 172 187    BNP Recent Labs  Lab 07/12/19 1913  BNP 1,991.9*     DDimer No results for input(s): DDIMER in the last 168 hours.   Radiology    No results found.  Cardiac Studies   Echocardiogram from April 2021 1. LVEF is severely epressed with diffuse hypokinesis, worse in the  septal, apical, anterior and inferior walls There is basal inferior  akinesis. Compared to echo report of 2018, LVEF is worse.. Left  ventricular ejection fraction, by estimation, is 20  to 25%. There is moderate left ventricular hypertrophy. Left ventricular  diastolic parameters are indeterminate.  2. Right ventricular systolic function is mildly reduced. The right  ventricular size is normal. There is mildly elevated pulmonary artery  systolic pressure.  3. Left atrial size was severely dilated.  4. Right atrial size was severely dilated.   6. Tricuspid valve regurgitation is moderate.    Patient Profile     64 y.o. male history of permanent  atrial fibrillation on Eliquis, nonischemic cardiomyopathy, last EF 20 to 25%, stroke complicated by expressive aphasia, right-sided weakness, hypertension presenting with shortness of breath and lower extremity edema being seen due to chronic heart failure and atrial fibrillation.  Assessment & Plan    1. NICM EF 20-25% Still with pitting woody edema of his legs up into the posterior thigh region --We will give dose of IV Lasix today x2 (hold oral dosing) --Suspect he will need to be transitioned to torsemide rather than p.o. Lasix -Continue Entresto low with close monitoring of blood pressure, running borderline low but asymptomatic -Holding beta-blocker due to low normal blood pressures.    No plan to restart given low pressure at this time  2.  Permanent A. Fib Reasonable rate control off beta-blocker, -Continue Eliquis Beta-blocker held for low pressure, will continue to monitor with exertion  3.  Hyperlipidemia -Statin   Total encounter time more than 25 minutes  Greater than 50% was spent in counseling and coordination of care with the patient   For questions or updates, please contact [facility] Please consult [email or web address] for contact info under        Signed, 64, MD  07/15/2019, 9:21 AM

## 2019-07-15 NOTE — Progress Notes (Signed)
   07/15/19 0438  ReDS Vest / Clip  BMI (Calculated) 21.11  Anatomical Comments NA due to BMI

## 2019-07-15 NOTE — Progress Notes (Signed)
Tele contacted Nurse approx. at this time. There was a 2.67 second pause and rhythm showed Afib.

## 2019-07-16 DIAGNOSIS — Z8673 Personal history of transient ischemic attack (TIA), and cerebral infarction without residual deficits: Secondary | ICD-10-CM

## 2019-07-16 DIAGNOSIS — R0602 Shortness of breath: Secondary | ICD-10-CM

## 2019-07-16 DIAGNOSIS — I428 Other cardiomyopathies: Secondary | ICD-10-CM

## 2019-07-16 LAB — BASIC METABOLIC PANEL
Anion gap: 10 (ref 5–15)
BUN: 20 mg/dL (ref 8–23)
CO2: 26 mmol/L (ref 22–32)
Calcium: 9.3 mg/dL (ref 8.9–10.3)
Chloride: 100 mmol/L (ref 98–111)
Creatinine, Ser: 1.04 mg/dL (ref 0.61–1.24)
GFR calc Af Amer: >60 mL/min (ref >60)
GFR calc non Af Amer: >60 mL/min (ref >60)
Glucose, Bld: 153 mg/dL — ABNORMAL HIGH (ref 70–99)
Potassium: 3.8 mmol/L (ref 3.5–5.1)
Sodium: 136 mmol/L (ref 135–145)

## 2019-07-16 LAB — CBC
HCT: 37.7 % — ABNORMAL LOW (ref 39.0–52.0)
Hemoglobin: 12.8 g/dL — ABNORMAL LOW (ref 13.0–17.0)
MCH: 31 pg (ref 26.0–34.0)
MCHC: 34 g/dL (ref 30.0–36.0)
MCV: 91.3 fL (ref 80.0–100.0)
Platelets: 171 10*3/uL (ref 150–400)
RBC: 4.13 MIL/uL — ABNORMAL LOW (ref 4.22–5.81)
RDW: 17.2 % — ABNORMAL HIGH (ref 11.5–15.5)
WBC: 5.5 10*3/uL (ref 4.0–10.5)
nRBC: 0 % (ref 0.0–0.2)

## 2019-07-16 LAB — GLUCOSE, CAPILLARY
Glucose-Capillary: 121 mg/dL — ABNORMAL HIGH (ref 70–99)
Glucose-Capillary: 157 mg/dL — ABNORMAL HIGH (ref 70–99)

## 2019-07-16 LAB — HEMOGLOBIN A1C
Hgb A1c MFr Bld: 6 % — ABNORMAL HIGH (ref 4.8–5.6)
Mean Plasma Glucose: 125.5 mg/dL

## 2019-07-16 MED ORDER — INSULIN ASPART 100 UNIT/ML ~~LOC~~ SOLN
0.0000 [IU] | Freq: Three times a day (TID) | SUBCUTANEOUS | Status: DC
  Administered 2019-07-17 – 2019-07-19 (×2): 1 [IU] via SUBCUTANEOUS
  Administered 2019-07-19: 3 [IU] via SUBCUTANEOUS
  Filled 2019-07-16 (×3): qty 1

## 2019-07-16 NOTE — Progress Notes (Signed)
PROGRESS NOTE    [name]    Code Status: Full Code  XEN:[id number] DOB: April 02, 1955 DOA: 07/12/2019 LOS: 3 days  PCP: Mirna Mires, MD CC:  Chief Complaint  Patient presents with  . 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 Admitted for systolic heart failure exacerbation and started on IV lasix. Cardiology was consulted and carvedilol was held and [facility] was decreased due to soft BPs.   Patient has had prolonged hospitalization due to poor volume status requiring aggressive diuresis   A & P   Active Problems:   Acute on chronic HFrEF (heart failure with reduced ejection fraction) (HCC)   Permanent atrial fibrillation (HCC)   NICM (nonischemic cardiomyopathy) (HCC)   1. Acute on chronic systolic heart failure exacerbation in setting of NICM a. EF 20-25% on 05/07/19 b. Cardio on board: volume overloaded today give IV lasix, may need to be transitioned to torsemide rather than PO lasix c. Continue Entresto d. Agree with continued diuresis per cardiology e. Beta blocker on hold due to soft BP  2. Permanent A. Fib, rate controlled a. Continue Eliquis b. Beta blocker as above  3. Hyperlipidemia on statin  4. Hypertension a. Coreg on hold b. Continue Entresto  5. 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 PT eval - SNF at discharge  6. Sinus pause a. 2.67 s pause yesterday with afib  b. ekg ordered, not done c. Electrolytes checked and in normal range d. Continue tele  DVT prophylaxis:  apixaban (ELIQUIS) tablet 5 mg   Family Communication: Patient's brother has been updated today at bedside  Disposition Plan:    Status is: Inpatient  Remains inpatient appropriate because:Inpatient level of care appropriate due to severity of illness   Dispo: The patient is from: Home              Anticipated d/c is to: TBD              Anticipated d/c date is: 1 day              Patient currently is not medically stable to d/c.          Pressure injury documentation    None  Consultants  Cardiology  Procedures  None  Antibiotics   Anti-infectives (From admission, onward)   None        Subjective   Seen and examined at bedside in no acute distress.   Objective   Vitals:   07/16/19 0450 07/16/19 0744 07/16/19 1151 07/16/19 1615  BP: 105/84 101/77 99/71 96/70   Pulse: 64 63 (!) 51 (!) 54  Resp:      Temp: 97.8 F (36.6 C) 97.7 F (36.5 C) 98.1 F (36.7 C) 97.8 F (36.6 C)  TempSrc: Oral Oral Oral Oral  SpO2: 95% 99% 100% 100%  Weight: 70.8 kg     Height:        Intake/Output Summary (Last 24 hours) at 07/16/2019 1622 Last data filed at 07/16/2019 1150 Gross per 24 hour  Intake 730.52 ml  Output 925 ml  Net -194.48 ml   Filed Weights   07/14/19 0421 07/15/19  5009 07/16/19 0450  Weight: 73.4 kg 70.6 kg 70.8 kg    Examination:  Physical Exam Vitals and nursing note reviewed.  Constitutional:      Appearance: Normal appearance.  HENT:     Head: Normocephalic and atraumatic.  Eyes:     Conjunctiva/sclera: Conjunctivae normal.  Cardiovascular:     Rate and Rhythm: Normal rate and regular rhythm.  Pulmonary:     Effort: Pulmonary effort is normal.     Breath sounds: Normal breath sounds.  Abdominal:     General: Abdomen is flat.     Palpations: Abdomen is soft.  Musculoskeletal:        General: No swelling or tenderness.     Right lower leg: Edema present.     Left lower leg: Edema present.     Comments: Pitting edema of bilateral lower extremities and posterior thighs, improved from yesterday  Skin:    Coloration: Skin is not jaundiced or pale.  Neurological:      Mental Status: He is alert. Mental status is at baseline.     Comments: Baseline right sided weakness and aphasia  Psychiatric:        Mood and Affect: Mood normal.        Behavior: Behavior normal.     Data Reviewed: I have personally reviewed following labs and imaging studies  CBC: Recent Labs  Lab 07/12/19 1913 07/13/19 1419 07/16/19 0832  WBC 4.8 5.2 5.5  HGB 12.5* 13.2 12.8*  HCT 36.7* 38.1* 37.7*  MCV 90.6 88.6 91.3  PLT 172 187 171   Basic Metabolic Panel: Recent Labs  Lab 07/13/19 1419 07/14/19 0557 07/15/19 0556 07/15/19 1850 07/16/19 0832  NA 138 137 137 137 136  K 3.6 3.9 3.6 3.9 3.8  CL 101 100 100 99 100  CO2 26 26 25 29 26   GLUCOSE 93 110* 121* 192* 153*  BUN 15 17 19 20 20   CREATININE 1.00 0.99 0.95 1.09 1.04  CALCIUM 9.6 9.4 9.4 9.2 9.3  MG 1.7  --   --  1.9  --    GFR: Estimated Creatinine Clearance: 72.8 mL/min (by C-G formula based on SCr of 1.04 mg/dL). Liver Function Tests: Recent Labs  Lab 07/12/19 1913  AST 39  ALT 18  ALKPHOS 157*  BILITOT 2.2*  PROT 7.1  ALBUMIN 3.8   Recent Labs  Lab 07/12/19 1913  LIPASE 75*   No results for input(s): AMMONIA in the last 168 hours. Coagulation Profile: Recent Labs  Lab 07/13/19 0021  INR 1.7*   Cardiac Enzymes: No results for input(s): CKTOTAL, CKMB, CKMBINDEX, TROPONINI in the last 168 hours. BNP (last 3 results) No results for input(s): PROBNP in the last 8760 hours. HbA1C: No results for input(s): HGBA1C in the last 72 hours. CBG: Recent Labs  Lab 07/12/19 2358 07/13/19 0124 07/13/19 0624 07/13/19 2204  GLUCAP 61* 121* 71 99   Lipid Profile: No results for input(s): CHOL, HDL, LDLCALC, TRIG, CHOLHDL, LDLDIRECT in the last 72 hours. Thyroid Function Tests: No results for input(s): TSH, T4TOTAL, FREET4, T3FREE, THYROIDAB in the last 72 hours. Anemia Panel: No results for input(s): VITAMINB12, FOLATE, FERRITIN, TIBC, IRON, RETICCTPCT in the last 72 hours. Sepsis  Labs: No results for input(s): PROCALCITON, LATICACIDVEN in the last 168 hours.  Recent Results (from the past 240 hour(s))  SARS Coronavirus 2 by RT PCR (hospital order, performed in [facility] hospital lab) Nasopharyngeal Nasopharyngeal Swab     Status: None   Collection Time: 07/13/19  12:21 AM   Specimen: Nasopharyngeal Swab  Result Value Ref Range Status   SARS Coronavirus 2 NEGATIVE NEGATIVE Final    Comment: (NOTE) SARS-CoV-2 target nucleic acids are NOT DETECTED.  The SARS-CoV-2 RNA is generally detectable in upper and lower respiratory specimens during the acute phase of infection. The lowest concentration of SARS-CoV-2 viral copies this assay can detect is 250 copies / mL. A negative result does not preclude SARS-CoV-2 infection and should not be used as the sole basis for treatment or other patient management decisions.  A negative result may occur with improper specimen collection / handling, submission of specimen other than nasopharyngeal swab, presence of viral mutation(s) within the areas targeted by this assay, and inadequate number of viral copies (<250 copies / mL). A negative result must be combined with clinical observations, patient history, and epidemiological information.  Fact Sheet for Patients:   BoilerBrush.com.cy  Fact Sheet for Healthcare Providers: https://pope.com/  This test is not yet approved or  cleared by the [place] FDA and has been authorized for detection and/or diagnosis of SARS-CoV-2 by FDA under an Emergency Use Authorization (EUA).  This EUA will remain in effect (meaning this test can be used) for the duration of the COVID-19 declaration under Section 564(b)(1) of the Act, 21 U.S.C. section 360bbb-3(b)(1), unless the authorization is terminated or revoked sooner.  Performed at [facility] [address] [place], [place] [address]          Radiology Studies: No  results found.      Scheduled Meds: . apixaban  5 mg Oral BID  . aspirin EC  81 mg Oral Daily  . furosemide  40 mg Intravenous Q8H  . insulin aspart  0-6 Units Subcutaneous TID WC  . loratadine  10 mg Oral Daily  . multivitamin with minerals  1 tablet Oral Daily  . omega-3 acid ethyl esters  1 g Oral Daily  . sacubitril-valsartan  1 tablet Oral BID  . simvastatin  40 mg Oral Daily   Continuous Infusions: . sodium chloride       Time spent: 26 minutes with over 50% of the time coordinating the patient's care    Jae Dire, DO [facility] Pager [phone]  Call night coverage person covering after 7pm

## 2019-07-16 NOTE — Progress Notes (Signed)
Progress Note  Patient Name: [name] Date of Encounter: 07/16/2019  Primary Cardiologist: Verne Carrow, MD   Subjective   ROS limited by aphasia; however, able to answer in yes and no to ROS.  Reports improved breathing status and swelling.  Notes that swelling is still present but greatly improved.  Reports stable/chronic mild atypical chest pain, located on the right side, and unchanged from baseline.  Denies any presyncope or syncope.  Denies any palpitations.  Joined today by his brother in law.    Discussed BP with brother-in-law and he states that they have a blood pressure cuff at home and continue to monitor his blood pressure at home.  He states that he will continue to monitor his blood pressure at home and watch out for any symptoms of presyncope or syncope.  Discussed need to schedule follow-up in the office.  Reviewed that we are holding his BB and reduced his Entresto dose, given his low BP. His brother indicated his understanding.  Inpatient Medications    Scheduled Meds: . apixaban  5 mg Oral BID  . aspirin EC  81 mg Oral Daily  . furosemide  40 mg Intravenous Q8H  . loratadine  10 mg Oral Daily  . multivitamin with minerals  1 tablet Oral Daily  . omega-3 acid ethyl esters  1 g Oral Daily  . sacubitril-valsartan  1 tablet Oral BID  . simvastatin  40 mg Oral Daily   Continuous Infusions: . sodium chloride     PRN Meds: sodium chloride, acetaminophen, ALPRAZolam, ondansetron (ZOFRAN) IV, zolpidem   Vital Signs    Vitals:   07/15/19 2010 07/16/19 0450 07/16/19 0744 07/16/19 1151  BP: 107/74 105/84 101/77 99/71  Pulse: (!) 117 64 63 (!) 51  Resp:      Temp: 98.6 F (37 C) 97.8 F (36.6 C) 97.7 F (36.5 C) 98.1 F (36.7 C)  TempSrc:  Oral Oral Oral  SpO2: 100% 95% 99% 100%  Weight:  70.8 kg    Height:        Intake/Output Summary (Last 24 hours) at 07/16/2019 1341 Last data filed at 07/16/2019 1150 Gross per 24 hour  Intake 970.52 ml    Output 1325 ml  Net -354.48 ml   Last 3 Weights 07/16/2019 07/15/2019 07/14/2019  Weight (lbs) 156 lb 155 lb 11.2 oz 161 lb 14.4 oz  Weight (kg) 70.761 kg 70.625 kg 73.437 kg      Telemetry    Atrial fibrillation, ventricular rates rates 60s -110s- Personally Reviewed  ECG    No new tracings - Personally Reviewed  Physical Exam   GEN: No acute distress.   Neck: JVP ~9cm Cardiac: IRIR, 1/6 systolic murmur, no rubs, or gallops.  Respiratory: reduced R basilar breath sounds, slight L sided crackles appreciated at the base. GI: Less firm and distended than in the ED, nontender MS: 1+ bilateral pitting edema; No deformity. Neuro:  Nonfocal, known aphasia Psych: Normal affect   Labs    High Sensitivity Troponin:   Recent Labs  Lab 07/12/19 1913 07/13/19 1419  TROPONINIHS 23* 23*      Chemistry Recent Labs  Lab 07/12/19 1913 07/13/19 1419 07/15/19 0556 07/15/19 1850 07/16/19 0832  NA 135   < > 137 137 136  K 4.3   < > 3.6 3.9 3.8  CL 101   < > 100 99 100  CO2 23   < > 25 29 26   GLUCOSE 55*   < > 121* 192* 153*  BUN 16   < > 19 20 20   CREATININE 1.17   < > 0.95 1.09 1.04  CALCIUM 9.6   < > 9.4 9.2 9.3  PROT 7.1  --   --   --   --   ALBUMIN 3.8  --   --   --   --   AST 39  --   --   --   --   ALT 18  --   --   --   --   ALKPHOS 157*  --   --   --   --   BILITOT 2.2*  --   --   --   --   GFRNONAA >60   < > >60 >60 >60  GFRAA >60   < > >60 >60 >60  ANIONGAP 11   < > 12 9 10    < > = values in this interval not displayed.     Hematology Recent Labs  Lab 07/12/19 1913 07/13/19 1419 07/16/19 0832  WBC 4.8 5.2 5.5  RBC 4.05* 4.30 4.13*  HGB 12.5* 13.2 12.8*  HCT 36.7* 38.1* 37.7*  MCV 90.6 88.6 91.3  MCH 30.9 30.7 31.0  MCHC 34.1 34.6 34.0  RDW 17.3* 17.7* 17.2*  PLT 172 187 171    BNP Recent Labs  Lab 07/12/19 1913  BNP 1,991.9*     DDimer No results for input(s): DDIMER in the last 168 hours.   Radiology    No results found.  Cardiac  Studies   Echo 05/07/19 1. LVEF is severely epressed with diffuse hypokinesis, worse in the  septal, apical, anterior and inferior walls There is basal inferior  akinesis. Compared to echo report of 2018, LVEF is worse.. Left  ventricular ejection fraction, by estimation, is 20  to 25%. There is moderate left ventricular hypertrophy. Left ventricular  diastolic parameters are indeterminate.  2. Right ventricular systolic function is mildly reduced. The right  ventricular size is normal. There is mildly elevated pulmonary artery  systolic pressure.  3. Left atrial size was severely dilated.  4. Right atrial size was severely dilated.  5. The mitral valve is abnormal. Mild mitral valve regurgitation.  6. Tricuspid valve regurgitation is moderate.  7. The aortic valve is normal in structure. Aortic valve regurgitation is  not visualized.   Echo November 2018: - Left ventricle: The cavity size was normal. Wall thickness was increased in a pattern of severe LVH. Systolic function was mildly reduced. The estimated ejection fraction was in the range of 45% to 50%. Diffuse hypokinesis. Doppler parameters are consistent with abnormal left ventricular relaxation (grade 1 diastolic dysfunction). The E/e&' ratio is between 8-15, suggesting indeterminate LV filling pressure. - Left atrium: The atrium was normal in size. - Tricuspid valve: There was mild regurgitation. - Pulmonary arteries: PA peak pressure: 29 mm Hg (S).   Cardiac cath 09/06/16: Diagnostic Diagram       Ost LAD lesion, 20 %stenosed.  Prox LAD lesion, 35 %stenosed.  Ost 1st Diag lesion, 40 %stenosed.  Ost 2nd Diag to 2nd Diag lesion, 20 %stenosed.  Normal to very minimal elevation of right heart pressures.  LVEDP 9 mm Hg  Mild nonobstructive CAD involving the LAD with 20% proximal stenosis, 30-40% stenosis in the region of the first diagonal takeoff with 40% stenosis in the diagonal-1  vessel, and 20% narrowing in the second diagonal vessel, with slow filling apically; normal left circumflex coronary artery and normal dominant RCA.  Findings are compatible  with a nonischemic cardiomyopathy.  RECOMMENDATION: Guideline directed medical therapy for the patient's nonischemic cardiomyopathy   Patient Profile     64 y.o. male with history of permanent atrial flutter on Eliquis, CVA 2010, expressive aphasia, chronic R sided weakness s/p CVA, HTN, DM, NICM, HFrEF (EF 20-25%), and who is being seen today for the evaluation of acute on chronic heart failure.   Assessment & Plan    Acute on chronic HFrEF --Volume overloaded at presentation and suspected 2/2 salty food intake. Previous 2018 R/LHC as above with most recent echo showing 04/2019 EF 20-25% and MR/TR as above. CTA at presentation showed right pleural effusion.  --Daily BMET. Baseline Cr 1.09-1.29. Continue to monitor renal function, electrolytes, and vitals closely. Monitor I/os, daily standing weights. Net -1L yesterday. Wt 73.4kg  70.8kg.  --Still volume up on exam. Continue IV diuresis as BP and Cr allow and with goal output net -2 L daily or until euvolemic on exam.    Will need to transition to oral diuresis before discharge. Continue his reduced dose Entresto as BP allows. Holding BB given low BP. With ongoing hypotension, consider holding Entresto to avoid pre-renal AKI.  At RTC, escalate /restart GDMT as tolerated as cannot at this time given BP.   Chest pain, atypical NICM -Reports ongoing chronic 2/10 chest discomfort x1 week.  Previous LHC as above with mild obstructive dz. Recent echo with EF drop to 20-25% with suspicion noted that could be due to suboptimal rate control. EKG without acute ST or T changes. HS minimally elevated and flat trending in the setting of volume overload, hypotension, Cr, Afib, and NSVT. Less consistent with ACS. No indication for emergent ischemic work-up at this time. Reassess sx at  clinic.  Hypotension --Soft BP. Asx. With ongoing hypotension, consider holding Entresto to prevent prerenal AKI and allow room for IV diuresis. Holding BB.   NSVT --Asx. Several runs of NSVT at presentation. BB held due to low BP. Consider Zio monitoring with 2 week Zio XT at discharge or RTC.  Permanent Atrial flutter with controlled ventricular rate --Known permanent atrial flutter s/p DCCV as above.  --Asx with usually controlled ventricular rate.  --BB held due to low BP. --Continue anticoagulation with Eliquis 5mg  BID.  --No current plan for repeat DCCV given asx and controlled ventricular rate in Afib.   HLD --Continue statin.  For questions or updates, please contact [facility] Please consult [email or web address] for contact info under        Signed, , PA-C  07/16/2019, 1:41 PM

## 2019-07-17 DIAGNOSIS — E876 Hypokalemia: Secondary | ICD-10-CM

## 2019-07-17 LAB — BASIC METABOLIC PANEL
Anion gap: 10 (ref 5–15)
BUN: 19 mg/dL (ref 8–23)
CO2: 26 mmol/L (ref 22–32)
Calcium: 9.1 mg/dL (ref 8.9–10.3)
Chloride: 104 mmol/L (ref 98–111)
Creatinine, Ser: 1.08 mg/dL (ref 0.61–1.24)
GFR calc Af Amer: >60 mL/min (ref >60)
GFR calc non Af Amer: >60 mL/min (ref >60)
Glucose, Bld: 114 mg/dL — ABNORMAL HIGH (ref 70–99)
Potassium: 3.4 mmol/L — ABNORMAL LOW (ref 3.5–5.1)
Sodium: 140 mmol/L (ref 135–145)

## 2019-07-17 LAB — GLUCOSE, CAPILLARY
Glucose-Capillary: 124 mg/dL — ABNORMAL HIGH (ref 70–99)
Glucose-Capillary: 144 mg/dL — ABNORMAL HIGH (ref 70–99)
Glucose-Capillary: 167 mg/dL — ABNORMAL HIGH (ref 70–99)
Glucose-Capillary: 163 mg/dL — ABNORMAL HIGH (ref 70–99)

## 2019-07-17 MED ORDER — FUROSEMIDE 40 MG PO TABS
40.0000 mg | ORAL_TABLET | Freq: Two times a day (BID) | ORAL | Status: DC
  Administered 2019-07-17 – 2019-07-19 (×4): 40 mg via ORAL
  Filled 2019-07-17 (×4): qty 1

## 2019-07-17 MED ORDER — POTASSIUM CHLORIDE CRYS ER 20 MEQ PO TBCR
40.0000 meq | EXTENDED_RELEASE_TABLET | Freq: Once | ORAL | Status: AC
  Administered 2019-07-17: 40 meq via ORAL
  Filled 2019-07-17: qty 2

## 2019-07-17 MED ORDER — POLYETHYLENE GLYCOL 3350 17 G PO PACK
17.0000 g | PACK | Freq: Every day | ORAL | Status: DC
  Administered 2019-07-17 – 2019-07-19 (×3): 17 g via ORAL
  Filled 2019-07-17 (×3): qty 1

## 2019-07-17 NOTE — Progress Notes (Signed)
Progress Note  Patient Name: [name] Date of Encounter: 07/17/2019  Primary Cardiologist: Verne Carrow, MD   Subjective   No complaints.   Inpatient Medications    Scheduled Meds: . apixaban  5 mg Oral BID  . aspirin EC  81 mg Oral Daily  . furosemide  40 mg Intravenous Q8H  . insulin aspart  0-6 Units Subcutaneous TID WC  . loratadine  10 mg Oral Daily  . multivitamin with minerals  1 tablet Oral Daily  . omega-3 acid ethyl esters  1 g Oral Daily  . polyethylene glycol  17 g Oral Daily  . sacubitril-valsartan  1 tablet Oral BID  . simvastatin  40 mg Oral Daily   Continuous Infusions: . sodium chloride     PRN Meds: sodium chloride, acetaminophen, ALPRAZolam, ondansetron (ZOFRAN) IV, zolpidem   Vital Signs    Vitals:   07/16/19 1151 07/16/19 1615 07/16/19 1932 07/17/19 0424  BP: 99/71 96/70 102/67 115/80  Pulse: (!) 51 (!) 54 63 63  Resp:      Temp: 98.1 F (36.7 C) 97.8 F (36.6 C) 97.6 F (36.4 C) 97.7 F (36.5 C)  TempSrc: Oral Oral Oral Oral  SpO2: 100% 100% 100% 100%  Weight:    69.7 kg  Height:        Intake/Output Summary (Last 24 hours) at 07/17/2019 1029 Last data filed at 07/17/2019 0815 Gross per 24 hour  Intake --  Output 1825 ml  Net -1825 ml   Last 3 Weights 07/17/2019 07/16/2019 07/15/2019  Weight (lbs) 153 lb 9.6 oz 156 lb 155 lb 11.2 oz  Weight (kg) 69.673 kg 70.761 kg 70.625 kg      Telemetry    Atrial flutter, rate 50-60 bpm- Personally Reviewed  ECG    No am EKG - Personally Reviewed  Physical Exam   General: Well developed, well nourished, NAD  HEENT: OP clear, mucus membranes moist  SKIN: warm, dry. No rashes. Psychiatric: Mood and affect normal  Neck: No JVD Lungs:Clear bilaterally, no wheezes, rhonci, crackles Cardiovascular: Irreg irreg.  Abdomen:Soft. Non-tender.  Extremities: No lower extremity edema.    Labs    High Sensitivity Troponin:   Recent Labs  Lab 07/12/19 1913 07/13/19 1419   TROPONINIHS 23* 23*      Chemistry Recent Labs  Lab 07/12/19 1913 07/13/19 1419 07/15/19 1850 07/16/19 0832 07/17/19 0920  NA 135   < > 137 136 140  K 4.3   < > 3.9 3.8 3.4*  CL 101   < > 99 100 104  CO2 23   < > 29 26 26   GLUCOSE 55*   < > 192* 153* 114*  BUN 16   < > 20 20 19   CREATININE 1.17   < > 1.09 1.04 1.08  CALCIUM 9.6   < > 9.2 9.3 9.1  PROT 7.1  --   --   --   --   ALBUMIN 3.8  --   --   --   --   AST 39  --   --   --   --   ALT 18  --   --   --   --   ALKPHOS 157*  --   --   --   --   BILITOT 2.2*  --   --   --   --   GFRNONAA >60   < > >60 >60 >60  GFRAA >60   < > >60 >60 >60  ANIONGAP 11   < > 9 10 10    < > = values in this interval not displayed.     Hematology Recent Labs  Lab 07/12/19 1913 07/13/19 1419 07/16/19 0832  WBC 4.8 5.2 5.5  RBC 4.05* 4.30 4.13*  HGB 12.5* 13.2 12.8*  HCT 36.7* 38.1* 37.7*  MCV 90.6 88.6 91.3  MCH 30.9 30.7 31.0  MCHC 34.1 34.6 34.0  RDW 17.3* 17.7* 17.2*  PLT 172 187 171    BNP Recent Labs  Lab 07/12/19 1913  BNP 1,991.9*     DDimer No results for input(s): DDIMER in the last 168 hours.   Radiology    No results found.  Cardiac Studies   Echo 05/07/19 1. LVEF is severely epressed with diffuse hypokinesis, worse in the  septal, apical, anterior and inferior walls There is basal inferior  akinesis. Compared to echo report of 2018, LVEF is worse.. Left  ventricular ejection fraction, by estimation, is 20  to 25%. There is moderate left ventricular hypertrophy. Left ventricular  diastolic parameters are indeterminate.  2. Right ventricular systolic function is mildly reduced. The right  ventricular size is normal. There is mildly elevated pulmonary artery  systolic pressure.  3. Left atrial size was severely dilated.  4. Right atrial size was severely dilated.  5. The mitral valve is abnormal. Mild mitral valve regurgitation.  6. Tricuspid valve regurgitation is moderate.  7. The aortic  valve is normal in structure. Aortic valve regurgitation is  not visualized.   Echo November 2018: - Left ventricle: The cavity size was normal. Wall thickness was increased in a pattern of severe LVH. Systolic function was mildly reduced. The estimated ejection fraction was in the range of 45% to 50%. Diffuse hypokinesis. Doppler parameters are consistent with abnormal left ventricular relaxation (grade 1 diastolic dysfunction). The E/e&' ratio is between 8-15, suggesting indeterminate LV filling pressure. - Left atrium: The atrium was normal in size. - Tricuspid valve: There was mild regurgitation. - Pulmonary arteries: PA peak pressure: 29 mm Hg (S).   Cardiac cath 09/06/16: Diagnostic Diagram       Ost LAD lesion, 20 %stenosed.  Prox LAD lesion, 35 %stenosed.  Ost 1st Diag lesion, 40 %stenosed.  Ost 2nd Diag to 2nd Diag lesion, 20 %stenosed.  Normal to very minimal elevation of right heart pressures.  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  RECOMMENDATION: Guideline directed medical therapy for the patient's nonischemic cardiomyopathy   Patient Profile     64 y.o. male with history of permanent atrial flutter on Eliquis, CVA 2010, expressive aphasia, chronic R sided weakness s/p CVA, HTN, DM, NICM, HFrEF (EF 20-25%), and who is being seen today for the evaluation of acute on chronic heart failure.   Assessment & Plan    1. Acute on chronic systolic CHF/Non-ischemic cardiomyopathy: Volume status better. He is negative 2.3 liters since admission and down around 4 kg. He has no dyspnea. No LE edema on  exam -I would change to Lasix 40 mg po BID today. OK to d/c home. I will arrange close follow up in our office.  -Continue Entresto. Do not restart beta blocker.   2. Hypokalemia: I  would start KDur 20 meq daily at discharge.   3. Permanent atrial flutter: Rate controlled. Continue Eliquis. I would not restart his beta blocker given his low blood pressures.   For questions or updates, please contact [facility] Please consult [email or web address] for contact info under        Signed, Verne Carrow, MD  07/17/2019, 10:29 AM

## 2019-07-17 NOTE — Progress Notes (Signed)
PROGRESS NOTE    [name]    Code Status: Full Code  YKD:[id number] DOB: 10/29/1955 DOA: 07/12/2019 LOS: 4 days  PCP: Mirna Mires, MD CC:  Chief Complaint  Patient presents with  . 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 Admitted for systolic heart failure exacerbation and started on IV lasix. Cardiology was consulted and carvedilol was held and [facility] was decreased due to soft BPs.   Patient has had prolonged hospitalization due to poor volume status requiring aggressive diuresis   A & P   Active Problems:   Acute on chronic HFrEF (heart failure with reduced ejection fraction) (HCC)   Permanent atrial fibrillation (HCC)   NICM (nonischemic cardiomyopathy) (HCC)   1. Acute on chronic systolic heart failure exacerbation in setting of NICM, improved a. EF 20-25% on 05/07/19 b. Cardio on board: ok to transition to Lasix 40 mg po BID today. OK to d/c home. Outpatient follow up. Do not start beta blocker c. Continue Entresto  2. Permanent A. Fib, rate controlled a. Continue Eliquis b. Beta blocker as above  3. Hyperlipidemia on statin  4. Hypertension a. Coreg on hold b. Continue Entresto  5. 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 PT eval - SNF at discharge  6. Sinus pause a. Continue tele  DVT prophylaxis:  apixaban (ELIQUIS) tablet 5 mg   Family Communication: Patient's niece has been updated today at bedside  Disposition Plan:  Status is: Inpatient  Remains inpatient appropriate because:Inpatient level of care appropriate due to severity of illness   Dispo: The patient is from: Home               Anticipated d/c is to: SNF              Anticipated d/c date is: 1 day              Patient currently is medically stable to d/c.          Pressure injury documentation    None  Consultants  Cardiology  Procedures  None  Antibiotics   Anti-infectives (From admission, onward)   None        Subjective   Niece at bedside today. Patient feeling better and without complaints, no overnight events.  Objective   Vitals:   07/16/19 1615 07/16/19 1932 07/17/19 0424 07/17/19 1132  BP: 96/70 102/67 115/80 101/72  Pulse: (!) 54 63 63 60  Resp:    17  Temp: 97.8 F (36.6 C) 97.6 F (36.4 C) 97.7 F (36.5 C) 97.9 F (36.6 C)  TempSrc: Oral Oral Oral   SpO2: 100% 100% 100% 100%  Weight:   69.7 kg   Height:        Intake/Output Summary (Last 24 hours) at 07/17/2019 1522 Last data filed at 07/17/2019 1319 Gross per 24 hour  Intake --  Output 2000 ml  Net -2000 ml   Filed Weights   07/15/19 0438 07/16/19 0450 07/17/19 0424  Weight: 70.6 kg 70.8 kg 69.7 kg    Examination:  Physical Exam Vitals and nursing note reviewed.  Constitutional:      Appearance:  Normal appearance.  HENT:     Head: Normocephalic and atraumatic.  Eyes:     Conjunctiva/sclera: Conjunctivae normal.  Cardiovascular:     Rate and Rhythm: Normal rate. Rhythm irregular.  Pulmonary:     Effort: Pulmonary effort is normal.     Breath sounds: Normal breath sounds.  Abdominal:     General: Abdomen is flat.     Palpations: Abdomen is soft.     Comments: No distension  Musculoskeletal:        General: No swelling or tenderness.     Comments: Compression stockings on  Skin:    Coloration: Skin is not jaundiced or pale.  Neurological:     Mental Status: He is alert. Mental status is at baseline.     Comments: At baseline  Psychiatric:        Mood and Affect: Mood normal.        Behavior: Behavior normal.     Data Reviewed: I have personally reviewed following labs and imaging  studies  CBC: Recent Labs  Lab 07/12/19 1913 07/13/19 1419 07/16/19 0832  WBC 4.8 5.2 5.5  HGB 12.5* 13.2 12.8*  HCT 36.7* 38.1* 37.7*  MCV 90.6 88.6 91.3  PLT 172 187 171   Basic Metabolic Panel: Recent Labs  Lab 07/13/19 1419 07/13/19 1419 07/14/19 0557 07/15/19 0556 07/15/19 1850 07/16/19 0832 07/17/19 0920  NA 138   < > 137 137 137 136 140  K 3.6   < > 3.9 3.6 3.9 3.8 3.4*  CL 101   < > 100 100 99 100 104  CO2 26   < > 26 25 29 26 26   GLUCOSE 93   < > 110* 121* 192* 153* 114*  BUN 15   < > 17 19 20 20 19   CREATININE 1.00   < > 0.99 0.95 1.09 1.04 1.08  CALCIUM 9.6   < > 9.4 9.4 9.2 9.3 9.1  MG 1.7  --   --   --  1.9  --   --    < > = values in this interval not displayed.   GFR: Estimated Creatinine Clearance: 69 mL/min (by C-G formula based on SCr of 1.08 mg/dL). Liver Function Tests: Recent Labs  Lab 07/12/19 1913  AST 39  ALT 18  ALKPHOS 157*  BILITOT 2.2*  PROT 7.1  ALBUMIN 3.8   Recent Labs  Lab 07/12/19 1913  LIPASE 75*   No results for input(s): AMMONIA in the last 168 hours. Coagulation Profile: Recent Labs  Lab 07/13/19 0021  INR 1.7*   Cardiac Enzymes: No results for input(s): CKTOTAL, CKMB, CKMBINDEX, TROPONINI in the last 168 hours. BNP (last 3 results) No results for input(s): PROBNP in the last 8760 hours. HbA1C: Recent Labs    07/16/19 0832  HGBA1C 6.0*   CBG: Recent Labs  Lab 07/13/19 2204 07/16/19 1717 07/16/19 2040 07/17/19 0759 07/17/19 1133  GLUCAP 99 121* 157* 124* 144*   Lipid Profile: No results for input(s): CHOL, HDL, LDLCALC, TRIG, CHOLHDL, LDLDIRECT in the last 72 hours. Thyroid Function Tests: No results for input(s): TSH, T4TOTAL, FREET4, T3FREE, THYROIDAB in the last 72 hours. Anemia Panel: No results for input(s): VITAMINB12, FOLATE, FERRITIN, TIBC, IRON, RETICCTPCT in the last 72 hours. Sepsis Labs: No results for input(s): PROCALCITON, LATICACIDVEN in the last 168 hours.  Recent Results (from  the past 240 hour(s))  SARS Coronavirus 2 by RT PCR (hospital order, performed in [facility] hospital lab) Nasopharyngeal Nasopharyngeal Swab  Status: None   Collection Time: 07/13/19 12:21 AM   Specimen: Nasopharyngeal Swab  Result Value Ref Range Status   SARS Coronavirus 2 NEGATIVE NEGATIVE Final    Comment: (NOTE) SARS-CoV-2 target nucleic acids are NOT DETECTED.  The SARS-CoV-2 RNA is generally detectable in upper and lower respiratory specimens during the acute phase of infection. The lowest concentration of SARS-CoV-2 viral copies this assay can detect is 250 copies / mL. A negative result does not preclude SARS-CoV-2 infection and should not be used as the sole basis for treatment or other patient management decisions.  A negative result may occur with improper specimen collection / handling, submission of specimen other than nasopharyngeal swab, presence of viral mutation(s) within the areas targeted by this assay, and inadequate number of viral copies (<250 copies / mL). A negative result must be combined with clinical observations, patient history, and epidemiological information.  Fact Sheet for Patients:   BoilerBrush.com.cy  Fact Sheet for Healthcare Providers: https://pope.com/  This test is not yet approved or  cleared by the [place] FDA and has been authorized for detection and/or diagnosis of SARS-CoV-2 by FDA under an Emergency Use Authorization (EUA).  This EUA will remain in effect (meaning this test can be used) for the duration of the COVID-19 declaration under Section 564(b)(1) of the Act, 21 U.S.C. section 360bbb-3(b)(1), unless the authorization is terminated or revoked sooner.  Performed at [facility], [address] [place], [place] [address]          Radiology Studies: No results found.      Scheduled Meds: . apixaban  5 mg Oral BID  . aspirin EC  81 mg Oral Daily   . furosemide  40 mg Intravenous Q8H  . insulin aspart  0-6 Units Subcutaneous TID WC  . loratadine  10 mg Oral Daily  . multivitamin with minerals  1 tablet Oral Daily  . omega-3 acid ethyl esters  1 g Oral Daily  . polyethylene glycol  17 g Oral Daily  . sacubitril-valsartan  1 tablet Oral BID  . simvastatin  40 mg Oral Daily   Continuous Infusions: . sodium chloride       Time spent: 20 minutes with over 50% of the time coordinating the patient's care    Jae Dire, DO [facility] Pager [phone]  Call night coverage person covering after 7pm

## 2019-07-18 DIAGNOSIS — I428 Other cardiomyopathies: Secondary | ICD-10-CM

## 2019-07-18 LAB — BASIC METABOLIC PANEL
Anion gap: 10 (ref 5–15)
BUN: 23 mg/dL (ref 8–23)
CO2: 25 mmol/L (ref 22–32)
Calcium: 9 mg/dL (ref 8.9–10.3)
Chloride: 103 mmol/L (ref 98–111)
Creatinine, Ser: 1.09 mg/dL (ref 0.61–1.24)
GFR calc Af Amer: >60 mL/min (ref >60)
GFR calc non Af Amer: >60 mL/min (ref >60)
Glucose, Bld: 149 mg/dL — ABNORMAL HIGH (ref 70–99)
Potassium: 3.9 mmol/L (ref 3.5–5.1)
Sodium: 138 mmol/L (ref 135–145)

## 2019-07-18 LAB — GLUCOSE, CAPILLARY
Glucose-Capillary: 138 mg/dL — ABNORMAL HIGH (ref 70–99)
Glucose-Capillary: 199 mg/dL — ABNORMAL HIGH (ref 70–99)
Glucose-Capillary: 163 mg/dL — ABNORMAL HIGH (ref 70–99)
Glucose-Capillary: 244 mg/dL — ABNORMAL HIGH (ref 70–99)

## 2019-07-18 NOTE — Progress Notes (Signed)
PROGRESS NOTE    [name]    Code Status: Full Code  KWI:[id number] DOB: [birth year] DOA: 07/12/2019 LOS: 5 days  PCP: Mirna Mires, MD CC:  Chief Complaint  Patient presents with  . 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 Admitted for systolic heart failure exacerbation and started on IV lasix. Cardiology was consulted and carvedilol was held and [facility] was decreased due to soft BPs.   Patient has had prolonged hospitalization due to poor volume status requiring aggressive diuresis   A & P   Active Problems:   Acute on chronic HFrEF (heart failure with reduced ejection fraction) (HCC)   Permanent atrial fibrillation (HCC)   NICM (nonischemic cardiomyopathy) (HCC)   1. Acute on chronic systolic heart failure exacerbation in setting of NICM, improved a. EF 20-25% on 05/07/19 b. Cardio on board: euvolemic. Continue current PO lasix c. Continue Entresto  2. Permanent A. Fib, rate controlled a. Continue Eliquis b. Beta blocker held due to soft BP  3. Hyperlipidemia on statin  4. Hypertension a. Coreg on hold b. Continue Entresto  5. 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 PT eval - SNF at discharge  6. Sinus pause a. No recurrence b. Continue tele  DVT prophylaxis:  apixaban (ELIQUIS) tablet 5 mg   Family Communication: Patient's niece has been updated at bedside yesterday  Disposition Plan: pending SNF availability Status is: Inpatient  Remains inpatient appropriate because:Unsafe d/c plan   Dispo: The patient is from: Home              Anticipated d/c is to: SNF              Anticipated d/c date is:  1 day              Patient currently is medically stable to d/c.          Pressure injury documentation   Pressure Injury 07/18/19 Heel Left Unstageable - Full thickness tissue loss in which the base of the injury is covered by slough (yellow, tan, gray, green or brown) and/or eschar (tan, brown or black) in the wound bed. intact with eschar (Active)  07/18/19 0800  Location: Heel  Location Orientation: Left  Staging: Unstageable - Full thickness tissue loss in which the base of the injury is covered by slough (yellow, tan, gray, green or brown) and/or eschar (tan, brown or black) in the wound bed.  Wound Description (Comments): intact with eschar  Present on Admission: Yes   None  Consultants  Cardiology  Procedures  None  Antibiotics   Anti-infectives (From admission, onward)   None        Subjective   Patient without any complaints or overnight events  Objective   Vitals:   07/18/19 0000 07/18/19 0032 07/18/19 0648 07/18/19 1000  BP:  (!) 112/91 107/74 116/70  Pulse:  80 63   Resp:  20 20   Temp:  98.5 F (36.9 C) (!) 97.5 F (36.4 C) 98.5 F (36.9 C)  TempSrc:  Oral Oral Oral  SpO2:  98% 100%   Weight: 69.7 kg     Height:  Intake/Output Summary (Last 24 hours) at 07/18/2019 1139 Last data filed at 07/18/2019 1113 Gross per 24 hour  Intake --  Output 1380 ml  Net -1380 ml   Filed Weights   07/16/19 0450 07/17/19 0424 07/18/19 0000  Weight: 70.8 kg 69.7 kg 69.7 kg    Examination:  Physical Exam Vitals and nursing note reviewed.  Constitutional:      Appearance: Normal appearance.  HENT:     Head: Normocephalic and atraumatic.  Eyes:     Conjunctiva/sclera: Conjunctivae normal.  Cardiovascular:     Rate and Rhythm: Normal rate. Rhythm irregular.  Pulmonary:     Effort: Pulmonary effort is normal.     Breath sounds: Normal breath sounds.  Abdominal:     General: Abdomen is flat.     Palpations: Abdomen is soft.   Musculoskeletal:        General: No swelling or tenderness.     Right lower leg: No edema.     Left lower leg: No edema.  Skin:    Coloration: Skin is not jaundiced or pale.  Neurological:     Mental Status: He is alert. Mental status is at baseline.  Psychiatric:        Mood and Affect: Mood normal.        Behavior: Behavior normal.     Data Reviewed: I have personally reviewed following labs and imaging studies  CBC: Recent Labs  Lab 07/12/19 1913 07/13/19 1419 07/16/19 0832  WBC 4.8 5.2 5.5  HGB 12.5* 13.2 12.8*  HCT 36.7* 38.1* 37.7*  MCV 90.6 88.6 91.3  PLT 172 187 171   Basic Metabolic Panel: Recent Labs  Lab 07/13/19 1419 07/14/19 0557 07/15/19 0556 07/15/19 1850 07/16/19 0832 07/17/19 0920 07/18/19 0657  NA 138   < > 137 137 136 140 138  K 3.6   < > 3.6 3.9 3.8 3.4* 3.9  CL 101   < > 100 99 100 104 103  CO2 26   < > 25 29 26 26 25   GLUCOSE 93   < > 121* 192* 153* 114* 149*  BUN 15   < > 19 20 20 19 23   CREATININE 1.00   < > 0.95 1.09 1.04 1.08 1.09  CALCIUM 9.6   < > 9.4 9.2 9.3 9.1 9.0  MG 1.7  --   --  1.9  --   --   --    < > = values in this interval not displayed.   GFR: Estimated Creatinine Clearance: 68.4 mL/min (by C-G formula based on SCr of 1.09 mg/dL). Liver Function Tests: Recent Labs  Lab 07/12/19 1913  AST 39  ALT 18  ALKPHOS 157*  BILITOT 2.2*  PROT 7.1  ALBUMIN 3.8   Recent Labs  Lab 07/12/19 1913  LIPASE 75*   No results for input(s): AMMONIA in the last 168 hours. Coagulation Profile: Recent Labs  Lab 07/13/19 0021  INR 1.7*   Cardiac Enzymes: No results for input(s): CKTOTAL, CKMB, CKMBINDEX, TROPONINI in the last 168 hours. BNP (last 3 results) No results for input(s): PROBNP in the last 8760 hours. HbA1C: Recent Labs    07/16/19 0832  HGBA1C 6.0*   CBG: Recent Labs  Lab 07/17/19 0759 07/17/19 1133 07/17/19 1639 07/17/19 2151 07/18/19 0806  GLUCAP 124* 144* 167* 163* 138*   Lipid Profile: No  results for input(s): CHOL, HDL, LDLCALC, TRIG, CHOLHDL, LDLDIRECT in the last 72 hours. Thyroid Function Tests: No results for input(s): TSH, T4TOTAL,  FREET4, T3FREE, THYROIDAB in the last 72 hours. Anemia Panel: No results for input(s): VITAMINB12, FOLATE, FERRITIN, TIBC, IRON, RETICCTPCT in the last 72 hours. Sepsis Labs: No results for input(s): PROCALCITON, LATICACIDVEN in the last 168 hours.  Recent Results (from the past 240 hour(s))  SARS Coronavirus 2 by RT PCR (hospital order, performed in [facility] hospital lab) Nasopharyngeal Nasopharyngeal Swab     Status: None   Collection Time: 07/13/19 12:21 AM   Specimen: Nasopharyngeal Swab  Result Value Ref Range Status   SARS Coronavirus 2 NEGATIVE NEGATIVE Final    Comment: (NOTE) SARS-CoV-2 target nucleic acids are NOT DETECTED.  The SARS-CoV-2 RNA is generally detectable in upper and lower respiratory specimens during the acute phase of infection. The lowest concentration of SARS-CoV-2 viral copies this assay can detect is 250 copies / mL. A negative result does not preclude SARS-CoV-2 infection and should not be used as the sole basis for treatment or other patient management decisions.  A negative result may occur with improper specimen collection / handling, submission of specimen other than nasopharyngeal swab, presence of viral mutation(s) within the areas targeted by this assay, and inadequate number of viral copies (<250 copies / mL). A negative result must be combined with clinical observations, patient history, and epidemiological information.  Fact Sheet for Patients:   BoilerBrush.com.cy  Fact Sheet for Healthcare Providers: https://pope.com/  This test is not yet approved or  cleared by the [place] FDA and has been authorized for detection and/or diagnosis of SARS-CoV-2 by FDA under an Emergency Use Authorization (EUA).  This EUA will remain in effect  (meaning this test can be used) for the duration of the COVID-19 declaration under Section 564(b)(1) of the Act, 21 U.S.C. section 360bbb-3(b)(1), unless the authorization is terminated or revoked sooner.  Performed at [facility], [address], [place], [place] [address]          Radiology Studies: No results found.      Scheduled Meds: . apixaban  5 mg Oral BID  . aspirin EC  81 mg Oral Daily  . furosemide  40 mg Oral BID  . insulin aspart  0-6 Units Subcutaneous TID WC  . loratadine  10 mg Oral Daily  . multivitamin with minerals  1 tablet Oral Daily  . omega-3 acid ethyl esters  1 g Oral Daily  . polyethylene glycol  17 g Oral Daily  . sacubitril-valsartan  1 tablet Oral BID  . simvastatin  40 mg Oral Daily   Continuous Infusions: . sodium chloride       Time spent: 16 minutes with over 50% of the time coordinating the patient's care    Jae Dire, DO [facility] Pager [phone]  Call night coverage person covering after 7pm

## 2019-07-18 NOTE — Progress Notes (Signed)
Progress Note  Patient Name: Hale Bogus Date of Encounter: 07/18/2019  Primary Cardiologist: Verne Carrow, MD   Subjective   No complaints  Inpatient Medications    Scheduled Meds: . apixaban  5 mg Oral BID  . aspirin EC  81 mg Oral Daily  . furosemide  40 mg Oral BID  . insulin aspart  0-6 Units Subcutaneous TID WC  . loratadine  10 mg Oral Daily  . multivitamin with minerals  1 tablet Oral Daily  . omega-3 acid ethyl esters  1 g Oral Daily  . polyethylene glycol  17 g Oral Daily  . sacubitril-valsartan  1 tablet Oral BID  . simvastatin  40 mg Oral Daily   Continuous Infusions: . sodium chloride     PRN Meds: sodium chloride, acetaminophen, ALPRAZolam, ondansetron (ZOFRAN) IV, zolpidem   Vital Signs    Vitals:   07/17/19 1929 07/18/19 0000 07/18/19 0032 07/18/19 0648  BP: 101/76  (!) 112/91 107/74  Pulse: 75  80 63  Resp: 20  20 20   Temp: 98.4 F (36.9 C)  98.5 F (36.9 C) (!) 97.5 F (36.4 C)  TempSrc: Oral  Oral Oral  SpO2: 100%  98% 100%  Weight:  69.7 kg    Height:        Intake/Output Summary (Last 24 hours) at 07/18/2019 0933 Last data filed at 07/18/2019 09/18/2019 Gross per 24 hour  Intake --  Output 980 ml  Net -980 ml   Last 3 Weights 07/18/2019 07/17/2019 07/16/2019  Weight (lbs) 153 lb 11.2 oz 153 lb 9.6 oz 156 lb  Weight (kg) 69.718 kg 69.673 kg 70.761 kg      Telemetry    Atrial fibrillation rates 70s- Personally Reviewed  ECG    No am EKG - Personally Reviewed  Physical Exam   General: Well developed, well nourished, NAD  HEENT: OP clear, mucus membranes moist  SKIN: warm, dry. No rashes. Psychiatric: Mood and affect normal  Neck: No JVD Lungs:Clear bilaterally, no wheezes, rhonci, crackles Cardiovascular: Irreg irreg. No murmurs, gallops or rubs. Abdomen:Soft. Bowel sounds present. Non-tender.  Extremities: No lower extremity edema.    Labs    High Sensitivity Troponin:   Recent Labs  Lab 07/12/19 1913  07/13/19 1419  TROPONINIHS 23* 23*      Chemistry Recent Labs  Lab 07/12/19 1913 07/13/19 1419 07/16/19 0832 07/17/19 0920 07/18/19 0657  NA 135   < > 136 140 138  K 4.3   < > 3.8 3.4* 3.9  CL 101   < > 100 104 103  CO2 23   < > 26 26 25   GLUCOSE 55*   < > 153* 114* 149*  BUN 16   < > 20 19 23   CREATININE 1.17   < > 1.04 1.08 1.09  CALCIUM 9.6   < > 9.3 9.1 9.0  PROT 7.1  --   --   --   --   ALBUMIN 3.8  --   --   --   --   AST 39  --   --   --   --   ALT 18  --   --   --   --   ALKPHOS 157*  --   --   --   --   BILITOT 2.2*  --   --   --   --   GFRNONAA >60   < > >60 >60 >60  GFRAA >60   < > >60 >60 >  60  ANIONGAP 11   < > 10 10 10    < > = values in this interval not displayed.     Hematology Recent Labs  Lab 07/12/19 1913 07/13/19 1419 07/16/19 0832  WBC 4.8 5.2 5.5  RBC 4.05* 4.30 4.13*  HGB 12.5* 13.2 12.8*  HCT 36.7* 38.1* 37.7*  MCV 90.6 88.6 91.3  MCH 30.9 30.7 31.0  MCHC 34.1 34.6 34.0  RDW 17.3* 17.7* 17.2*  PLT 172 187 171    BNP Recent Labs  Lab 07/12/19 1913  BNP 1,991.9*     DDimer No results for input(s): DDIMER in the last 168 hours.   Radiology    No results found.  Cardiac Studies   Echo 05/07/19 1. LVEF is severely epressed with diffuse hypokinesis, worse in the  septal, apical, anterior and inferior walls There is basal inferior  akinesis. Compared to echo report of 2018, LVEF is worse.. Left  ventricular ejection fraction, by estimation, is 20  to 25%. There is moderate left ventricular hypertrophy. Left ventricular  diastolic parameters are indeterminate.  2. Right ventricular systolic function is mildly reduced. The right  ventricular size is normal. There is mildly elevated pulmonary artery  systolic pressure.  3. Left atrial size was severely dilated.  4. Right atrial size was severely dilated.  5. The mitral valve is abnormal. Mild mitral valve regurgitation.  6. Tricuspid valve regurgitation is moderate.   7. The aortic valve is normal in structure. Aortic valve regurgitation is  not visualized.   Echo November 2018: - Left ventricle: The cavity size was normal. Wall thickness was increased in a pattern of severe LVH. Systolic function was mildly reduced. The estimated ejection fraction was in the range of 45% to 50%. Diffuse hypokinesis. Doppler parameters are consistent with abnormal left ventricular relaxation (grade 1 diastolic dysfunction). The E/e&' ratio is between 8-15, suggesting indeterminate LV filling pressure. - Left atrium: The atrium was normal in size. - Tricuspid valve: There was mild regurgitation. - Pulmonary arteries: PA peak pressure: 29 mm Hg (S).   Cardiac cath 09/06/16: Diagnostic Diagram       Ost LAD lesion, 20 %stenosed.  Prox LAD lesion, 35 %stenosed.  Ost 1st Diag lesion, 40 %stenosed.  Ost 2nd Diag to 2nd Diag lesion, 20 %stenosed.  Normal to very minimal elevation of right heart pressures.  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  RECOMMENDATION: Guideline directed medical therapy for the patient's nonischemic cardiomyopathy   Patient Profile     64 y.o. male with history of permanent atrial flutter on Eliquis, CVA 2010, expressive aphasia, chronic R sided weakness s/p CVA, HTN, DM, NICM, HFrEF (EF 20-25%), and who is being seen today for the evaluation of acute on chronic heart failure.   Assessment & Plan    1. Acute on chronic systolic CHF/Non-ischemic cardiomyopathy: Volume status better. He is negative 3.3 liters since admission. No dyspnea. No LE edema on exam today.   -Continue Lasix 40 mg po BID. -OK to d/c from cardiac standpoint. Pt awaiting SNF bed. I will arrange close follow up in our office.  -Continue Entresto. Do not restart beta blocker.   2. Hypokalemia: I would  start KDur 20 meq daily at discharge.   3. Permanent atrial flutter: Rate controlled. Continue Eliquis. I would not restart his beta blocker given his low blood pressures.   For questions or updates, please contact [facility] Please consult [email or web address] for contact info under        Signed, Verne Carrow, MD  07/18/2019, 9:33 AM

## 2019-07-19 LAB — CBC
HCT: 34.7 % — ABNORMAL LOW (ref 39.0–52.0)
Hemoglobin: 11.6 g/dL — ABNORMAL LOW (ref 13.0–17.0)
MCH: 30.9 pg (ref 26.0–34.0)
MCHC: 33.4 g/dL (ref 30.0–36.0)
MCV: 92.3 fL (ref 80.0–100.0)
Platelets: 171 10*3/uL (ref 150–400)
RBC: 3.76 MIL/uL — ABNORMAL LOW (ref 4.22–5.81)
RDW: 17.1 % — ABNORMAL HIGH (ref 11.5–15.5)
WBC: 5.2 10*3/uL (ref 4.0–10.5)
nRBC: 0 % (ref 0.0–0.2)

## 2019-07-19 LAB — GLUCOSE, CAPILLARY
Glucose-Capillary: 157 mg/dL — ABNORMAL HIGH (ref 70–99)
Glucose-Capillary: 291 mg/dL — ABNORMAL HIGH (ref 70–99)
Glucose-Capillary: 137 mg/dL — ABNORMAL HIGH (ref 70–99)

## 2019-07-19 MED ORDER — FUROSEMIDE 40 MG PO TABS: 40.0000 mg | ORAL_TABLET | Freq: Two times a day (BID) | ORAL | 0 refills | Status: DC

## 2019-07-19 MED ORDER — SACUBITRIL-VALSARTAN 24-26 MG PO TABS: 1.0000 | ORAL_TABLET | Freq: Two times a day (BID) | ORAL | 1 refills | Status: AC

## 2019-07-19 NOTE — TOC Transition Note (Signed)
Transition of Care [facility]) - CM/SW Discharge Note   Patient Details  Name: [name] MRN: [id number] Date of Birth: [birth year]  Transition of Care [facility]) CM/SW Contact:  Meriel Flavors, LCSW Phone Number: 07/19/2019, 3:12 PM   Clinical Narrative:    CSW met with patient today to confirm patient's refusal for SNF and wanting to go home with [facility]. Patient again refused SNF and ask for [facility] instead. CSW called niece [name], she agreed patient to go home with HH/PT and speech. [name] will pick patient up this evening to transport home.     Barriers to Discharge: No Barriers Identified   Patient Goals and CMS Choice Patient states their goals for this hospitalization and ongoing recovery are:: Pateitn would like to go home with home health.  Does not want to go to skilled nursing. CMS Medicare.gov Compare Post Acute Care list provided to:: Other (Comment Required) Early Osmond [phone], niece) Choice offered to / list presented to : Adult Children  Discharge Placement                       Discharge Plan and Services In-house Referral: Clinical Social Work   Post Acute Care Choice: Home Health                               Social Determinants of Health (SDOH) Interventions     Readmission Risk Interventions No flowsheet data found.

## 2019-07-19 NOTE — Discharge Summary (Signed)
Physician Discharge Summary  [name] KGY:[id number] DOB: [birth year]   PCP: Mirna Mires, MD  Admit date: 07/12/2019 Discharge date: 07/19/2019 Length of Stay: 6 days   Code Status: Full Code  Admitted From:  Home Discharged to:   Home Home Health: PT  Equipment/Devices:  None Discharge Condition:  Stable  Recommendations for Outpatient Follow-up   1. Follow up with PCP in 1 week 2. Follow up with cardiology 3. 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 Admitted for systolic heart failure exacerbation and started on IV lasix. Cardiology was consulted and carvedilol was held and [facility] was decreased due to soft BPs. Patient has had prolonged hospitalization due to poor volume status requiring aggressive diuresis as well as waiting for SNF placement.  Eventually, his volume status improved and he was switched to p.o. Lasix.  On 7/12, patient refused to go to SNF and preferred to go home with home health.  He was discharged in stable condition with discontinued Coreg, decreased Entresto and increase Lasix to 40 mg twice daily.  Close outpatient follow-up recommended  A & P   Active Problems:   Acute on chronic HFrEF (heart failure with reduced ejection fraction) (HCC)   Permanent atrial fibrillation (HCC)   NICM (nonischemic cardiomyopathy) (HCC)     1. Acute on chronic systolic heart failure exacerbation in setting of NICM, resolved a. EF 20-25% on 05/07/19 b. Cardio was consulted and felt the patient to be euvolemic on 7/11 c. Discontinue beta-blocker due to low BP d. Decreased Entresto continue at discharge e. Compression  stockings  2. Permanent A. Fib, rate controlled a. Continue Eliquis b. Beta blocker held due to soft BP  3. Hyperlipidemia on statin  4. Hypertension a. Coreg on hold b. Continue Entresto  5. 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 PT eval recommended SNF at discharge, patient refused SNF on 7/12 b. HH PT c. On aspirin, monitor for bleed while on Eliquis as well  6. Sinus pause a. No recurrence    Consultants  . Cardiology  Procedures  . None  Antibiotics   Anti-infectives (From admission, onward)   None       Subjective    Seen and examined at bedside no acute distress resting comfortably.  Denies any complaints or overnight events.  Initially this morning he was okay with going to SNF at discharge.  After discussion with social work, the patient refused to go to SNF and preferred to be discharged home is being discharged home in stable condition.  Objective   Discharge Exam: Vitals:   07/19/19 0738 07/19/19 1136  BP: 105/78 101/66  Pulse: 72 (!) 54  Resp: 17 16  Temp: 98.4 F (36.9 C) 98.1 F (36.7 C)  SpO2: 100% 99%   Vitals:   07/18/19 2256 07/19/19 0554 07/19/19 0738 07/19/19 1136  BP: 107/81  105/78 101/66  Pulse: 67  72 (!) 54  Resp:   17 16  Temp:   98.4 F (36.9 C) 98.1 F (36.7 C)  TempSrc:    Oral  SpO2:   100% 99%  Weight:  68.4 kg    Height:  Physical Exam Vitals and nursing note reviewed.  Constitutional:      Appearance: Normal appearance.  HENT:     Head: Normocephalic and atraumatic.  Eyes:     Conjunctiva/sclera: Conjunctivae normal.  Cardiovascular:     Rate and Rhythm: Normal rate and regular rhythm.  Pulmonary:     Effort: Pulmonary effort is normal.     Breath sounds: Normal breath sounds.  Abdominal:     General: Abdomen is flat.     Palpations: Abdomen is soft.  Musculoskeletal:        General: No swelling or tenderness.     Comments: Trace lower  extremity edema with compression stockings  Skin:    Coloration: Skin is not jaundiced or pale.  Neurological:     Mental Status: He is alert. Mental status is at baseline.     Comments: At baseline right sided residual weakness  Psychiatric:        Mood and Affect: Mood normal.        Behavior: Behavior normal.       The results of significant diagnostics from this hospitalization (including imaging, microbiology, ancillary and laboratory) are listed below for reference.     Microbiology: Recent Results (from the past 240 hour(s))  SARS Coronavirus 2 by RT PCR (hospital order, performed in [facility] hospital lab) Nasopharyngeal Nasopharyngeal Swab     Status: None   Collection Time: 07/13/19 12:21 AM   Specimen: Nasopharyngeal Swab  Result Value Ref Range Status   SARS Coronavirus 2 NEGATIVE NEGATIVE Final    Comment: (NOTE) SARS-CoV-2 target nucleic acids are NOT DETECTED.  The SARS-CoV-2 RNA is generally detectable in upper and lower respiratory specimens during the acute phase of infection. The lowest concentration of SARS-CoV-2 viral copies this assay can detect is 250 copies / mL. A negative result does not preclude SARS-CoV-2 infection and should not be used as the sole basis for treatment or other patient management decisions.  A negative result may occur with improper specimen collection / handling, submission of specimen other than nasopharyngeal swab, presence of viral mutation(s) within the areas targeted by this assay, and inadequate number of viral copies (<250 copies / mL). A negative result must be combined with clinical observations, patient history, and epidemiological information.  Fact Sheet for Patients:   BoilerBrush.com.cy  Fact Sheet for Healthcare Providers: https://pope.com/  This test is not yet approved or  cleared by the [place] FDA and has been authorized for detection and/or diagnosis  of SARS-CoV-2 by FDA under an Emergency Use Authorization (EUA).  This EUA will remain in effect (meaning this test can be used) for the duration of the COVID-19 declaration under Section 564(b)(1) of the Act, 21 U.S.C. section 360bbb-3(b)(1), unless the authorization is terminated or revoked sooner.  Performed at [facility], [address] [place], [place] [address]      Labs: BNP (last 3 results) Recent Labs    07/12/19 1913  BNP 1,991.9*   Basic Metabolic Panel: Recent Labs  Lab 07/13/19 1419 07/14/19 0557 07/15/19 0556 07/15/19 1850 07/16/19 0832 07/17/19 0920 07/18/19 0657  NA 138   < > 137 137 136 140 138  K 3.6   < > 3.6 3.9 3.8 3.4* 3.9  CL 101   < > 100 99 100 104 103  CO2 26   < > 25 29 26 26 25   GLUCOSE 93   < > 121* 192* 153* 114* 149*  BUN 15   < >  CREATININE 1.00   < > 0.95 1.09 1.04 1.08 1.09  CALCIUM 9.6   < > 9.4 9.2 9.3 9.1 9.0  MG 1.7  --   --  1.9  --   --   --    < > = values in this interval not displayed.   Liver Function Tests: Recent Labs  Lab 07/12/19 1913  AST 39  ALT 18  ALKPHOS 157*  BILITOT 2.2*  PROT 7.1  ALBUMIN 3.8   Recent Labs  Lab 07/12/19 1913  LIPASE 75*   No results for input(s): AMMONIA in the last 168 hours. CBC: Recent Labs  Lab 07/12/19 1913 07/13/19 1419 07/16/19 0832 07/19/19 0710  WBC 4.8 5.2 5.5 5.2  HGB 12.5* 13.2 12.8* 11.6*  HCT 36.7* 38.1* 37.7* 34.7*  MCV 90.6 88.6 91.3 92.3  PLT 172 187 171 171   Cardiac Enzymes: No results for input(s): CKTOTAL, CKMB, CKMBINDEX, TROPONINI in the last 168 hours. BNP: Invalid input(s): POCBNP CBG: Recent Labs  Lab 07/18/19 1154 07/18/19 1655 07/18/19 2102 07/19/19 0738 07/19/19 1138  GLUCAP 199* 163* 244* 157* 291*   D-Dimer No results for input(s): DDIMER in the last 72 hours. Hgb A1c No results for input(s): HGBA1C in the last 72 hours. Lipid Profile No results for input(s): CHOL, HDL, LDLCALC, TRIG, CHOLHDL,  LDLDIRECT in the last 72 hours. Thyroid function studies No results for input(s): TSH, T4TOTAL, T3FREE, THYROIDAB in the last 72 hours.  Invalid input(s): FREET3 Anemia work up No results for input(s): VITAMINB12, FOLATE, FERRITIN, TIBC, IRON, RETICCTPCT in the last 72 hours. Urinalysis    Component Value Date/Time   LABSPEC <=1.005 11/17/2008 1539   PHURINE 6.0 11/17/2008 1539   GLUCOSEU NEGATIVE 11/17/2008 1539   HGBUR NEGATIVE 11/17/2008 1539   BILIRUBINUR NEGATIVE 11/17/2008 1539   KETONESUR NEGATIVE 11/17/2008 1539   PROTEINUR NEGATIVE 11/17/2008 1539   UROBILINOGEN 1.0 11/17/2008 1539   NITRITE NEGATIVE 11/17/2008 1539   LEUKOCYTESUR  11/17/2008 1539    NEGATIVE Biochemical Testing Only. Please order routine urinalysis from main lab if confirmatory testing is needed.   Sepsis Labs Invalid input(s): PROCALCITONIN,  WBC,  LACTICIDVEN Microbiology Recent Results (from the past 240 hour(s))  SARS Coronavirus 2 by RT PCR (hospital order, performed in [facility] hospital lab) Nasopharyngeal Nasopharyngeal Swab     Status: None   Collection Time: 07/13/19 12:21 AM   Specimen: Nasopharyngeal Swab  Result Value Ref Range Status   SARS Coronavirus 2 NEGATIVE NEGATIVE Final    Comment: (NOTE) SARS-CoV-2 target nucleic acids are NOT DETECTED.  The SARS-CoV-2 RNA is generally detectable in upper and lower respiratory specimens during the acute phase of infection. The lowest concentration of SARS-CoV-2 viral copies this assay can detect is 250 copies / mL. A negative result does not preclude SARS-CoV-2 infection and should not be used as the sole basis for treatment or other patient management decisions.  A negative result may occur with improper specimen collection / handling, submission of specimen other than nasopharyngeal swab, presence of viral mutation(s) within the areas targeted by this assay, and inadequate number of viral copies (<250 copies / mL). A negative result must  be combined with clinical observations, patient history, and epidemiological information.  Fact Sheet for Patients:   BoilerBrush.com.cy  Fact Sheet for Healthcare Providers: https://pope.com/  This test is not yet approved or  cleared by the [place] FDA and has been authorized for detection and/or diagnosis of SARS-CoV-2 by  FDA under an Emergency Use Authorization (EUA).  This EUA will remain in effect (meaning this test can be used) for the duration of the COVID-19 declaration under Section 564(b)(1) of the Act, 21 U.S.C. section 360bbb-3(b)(1), unless the authorization is terminated or revoked sooner.  Performed at [facility], [address], [place], [place] [address]     Discharge Instructions     Discharge Instructions    Diet - low sodium heart healthy   Complete by: As directed    Discharge instructions   Complete by: As directed    -Stop taking carvedilol -Take Lasix 40 mg twice daily - Change her dose of Entresto, this was decreased -Follow-up with your primary care physician and cardiologist in the next 2 weeks If you have any significant change or worsening of your symptoms, do not hesitate to contact your primary care physician or return to the ED.   Face-to-face encounter (required for Medicare/Medicaid patients)   Complete by: As directed    I Jae Dire certify that this patient is under my care and that I, or a nurse practitioner or physician's assistant working with me, had a face-to-face encounter that meets the physician face-to-face encounter requirements with this patient on 07/19/2019. The encounter with the patient was in whole, or in part for the following medical condition(s) which is the primary reason for home health care (List medical condition): history of stroke with residual right sided weakness and ambulatory dysfunction   The encounter with the patient was in whole, or in part,  for the following medical condition, which is the primary reason for home health care: ambulatory dysfunction   I certify that, based on my findings, the following services are medically necessary home health services: Physical therapy   Reason for Medically Necessary Home Health Services: Therapy- Home Adaptation to Facilitate Safety   My clinical findings support the need for the above services: Unsafe ambulation due to balance issues   Further, I certify that my clinical findings support that this patient is homebound due to: Unable to leave home safely without assistance   Home Health   Complete by: As directed    To provide the following care/treatments: PT   Increase activity slowly   Complete by: As directed      Allergies as of 07/19/2019   No Known Allergies     Medication List    STOP taking these medications   carvedilol 3.125 MG tablet Commonly known as: COREG   Entresto 49-51 MG Generic drug: sacubitril-valsartan Replaced by: sacubitril-valsartan 24-26 MG     TAKE these medications   acetaminophen 500 MG tablet Commonly known as: TYLENOL Take 1,000 mg by mouth every 6 (six) hours as needed for pain.   apixaban 5 MG Tabs tablet Commonly known as: Eliquis Take 1 tablet (5 mg total) by mouth 2 (two) times daily.   aspirin 81 MG EC tablet Take 1 tablet (81 mg total) by mouth daily.   furosemide 40 MG tablet Commonly known as: LASIX Take 1 tablet (40 mg total) by mouth 2 (two) times daily. What changed: when to take this   glipiZIDE 10 MG tablet Commonly known as: GLUCOTROL Take 1 tablet (10 mg total) by mouth daily with breakfast.   loratadine 10 MG tablet Commonly known as: CLARITIN TK 1 T PO D   metFORMIN 1000 MG tablet Commonly known as: GLUCOPHAGE Take 1,000 mg by mouth 2 (two) times daily.   multivitamin with minerals Tabs tablet Take 1  tablet by mouth daily.   omega-3 acid ethyl esters 1 g capsule Commonly known as: LOVAZA Take 1 g by mouth  daily.   sacubitril-valsartan 24-26 MG Commonly known as: ENTRESTO Take 1 tablet by mouth 2 (two) times daily. Replaces: Entresto 49-51 MG   simvastatin 40 MG tablet Commonly known as: ZOCOR Take 40 mg by mouth daily.       Follow-up Information    [facility] Follow up on 08/10/2019.   Specialty: Cardiology Why: at 10:00am. Enter through the [facility] entrance Contact information: [address] [place] [address] [phone]             No Known Allergies   Dispo: The patient is from: Home              Anticipated d/c is to: Home              Anticipated d/c date is today              Patient currently is medically stable to d/c.       Time coordinating discharge: Over 30 minutes   SIGNED:   Jae Dire, D.O. [facility] Pager: [phone]  07/19/2019, 2:30 PM

## 2019-07-23 ENCOUNTER — Telehealth: Payer: Self-pay | Admitting: Family

## 2019-07-23 NOTE — Telephone Encounter (Signed)
Spoke with niece as patient is non verbal. He is doing well as they are checking his weight daily, following low sodium diet with no symptoms to report and has no problems with medications. They confirmed his appointment for 8/3.   Rooney Gladwin, NT

## 2019-08-09 NOTE — Progress Notes (Signed)
Patient ID: [name], male    DOB: [birth year], 64 y.o.   MRN: [id number]  HPI  [name] is a 64 y/o male with a history of DM, HTN, stroke, atrial flutter and chronic heart failure.   Echo report from 05/07/19 reviewed and showed an EF of 20-25% along with moderate LVH, mild PA pressure and moderate TR.   Catheterization done 09/06/16 showed:  Ost LAD lesion, 20 %stenosed.  Prox LAD lesion, 35 %stenosed.  Ost 1st Diag lesion, 40 %stenosed.  Ost 2nd Diag to 2nd Diag lesion, 20 %stenosed. Normal to very minimal elevation of right heart pressures. LVEDP 9 mm Hg  Admitted 07/12/19 due to acute on chronic heart failure. Cardiology consult obtained. Initially needed IV lasix with transition to oral diuretics. Carvedilol and entresto adjusted due to soft BP's. PT eval done with SNF recommended by patient refused. Discharged after 7 days with home health.   He presents today for an initial visit with a chief complaint of pedal edema. He says that this has been present for several months although does feel like it's improving. He has associated right sided weakness along with this. He denies any difficulty sleeping, dizziness, abdominal distention, palpitations, chest pain, shortness of breath, cough, fatigue or weight gain.   Past Medical History:  Diagnosis Date  . Atrial flutter (HCC)   . CHF (congestive heart failure) (HCC)   . Diabetes mellitus   . Hypertension   . 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 Stroke [facility])    Past Surgical History:  Procedure Laterality Date  . CARDIOVERSION N/A 10/10/2016   Procedure: CARDIOVERSION;  Surgeon: Lewayne Bunting, MD;  Location: [facility] ENDOSCOPY;  Service: Cardiovascular;  Laterality: N/A;  . RIGHT/LEFT HEART CATH AND CORONARY ANGIOGRAPHY N/A 09/06/2016   Procedure: RIGHT/LEFT HEART CATH AND CORONARY ANGIOGRAPHY;  Surgeon: Lennette Bihari, MD;   Location: [facility] INVASIVE CV LAB;  Service: Cardiovascular;  Laterality: N/A;  . TRACHEOSTOMY     Family History  Problem Relation Age of Onset  . Hypertension Mother   . Diabetes Mother   . Heart disease Father    Social History   Tobacco Use  . Smoking status: Never Smoker  . Smokeless tobacco: Never Used  Substance Use Topics  . Alcohol use: No   No Known Allergies Prior to Admission medications   Medication Sig Start Date End Date Taking? Authorizing Provider  acetaminophen (TYLENOL) 500 MG tablet Take 1,000 mg by mouth every 6 (six) hours as needed for pain.   Yes [provider]  apixaban (ELIQUIS) 5 MG TABS tablet Take 1 tablet (5 mg total) by mouth 2 (two) times daily. 03/15/19  Yes Kathleene Hazel, MD  aspirin EC 81 MG EC tablet Take 1 tablet (81 mg total) by mouth daily. 09/09/16  Yes Leone Brand, NP  furosemide (LASIX) 40 MG tablet Take 1 tablet (40 mg total) by mouth 2 (two) times daily. 07/19/19  Yes Jae Dire, MD  glipiZIDE (GLUCOTROL) 10 MG tablet Take 1 tablet (10 mg total) by mouth daily with breakfast. 09/09/16  Yes Leone Brand, NP  loratadine (CLARITIN) 10 MG tablet TK 1 T PO D 06/29/18  Yes [provider]  metFORMIN (GLUCOPHAGE) 1000 MG tablet Take 1,000 mg by mouth 2 (two) times daily.   Yes [provider]  Multiple Vitamin (MULTIVITAMIN WITH MINERALS) TABS Take 1 tablet by mouth  daily.   Yes [provider]  omega-3 acid ethyl esters (LOVAZA) 1 G capsule Take 1 g by mouth daily.   Yes [provider]  sacubitril-valsartan (ENTRESTO) 24-26 MG Take 1 tablet by mouth 2 (two) times daily. 07/19/19 08/18/19 Yes Jae Dire, MD  simvastatin (ZOCOR) 40 MG tablet Take 40 mg by mouth daily.   Yes [provider]    Review of Systems  Constitutional: Negative for appetite change and fatigue.  HENT: Negative for congestion, postnasal drip and sore throat.   Eyes: Negative.   Respiratory: Negative for  cough, chest tightness and shortness of breath.   Cardiovascular: Positive for leg swelling. Negative for chest pain and palpitations.  Gastrointestinal: Negative for abdominal distention and abdominal pain.  Endocrine: Negative.   Genitourinary: Negative.   Musculoskeletal: Negative for back pain and neck pain.  Skin: Negative.   Allergic/Immunologic: Negative.   Neurological: Positive for weakness (right arm). Negative for dizziness and light-headedness.       Expressive aphasia  Hematological: Negative for adenopathy. Does not bruise/bleed easily.  Psychiatric/Behavioral: Negative for behavioral problems, dysphoric mood and sleep disturbance.    Vitals:   08/10/19 1003  BP: 98/74  Pulse: 73  Resp: 18  SpO2: 100%  Weight: 150 lb (68 kg)  Height: 6' (1.829 m)   Wt Readings from Last 3 Encounters:  08/10/19 150 lb (68 kg)  07/19/19 150 lb 12.7 oz (68.4 kg)  04/21/19 144 lb 6.4 oz (65.5 kg)   Lab Results  Component Value Date   CREATININE 1.09 07/18/2019   CREATININE 1.08 07/17/2019   CREATININE 1.04 07/16/2019   Physical Exam Vitals and nursing note reviewed.  Constitutional:      Appearance: Normal appearance.  HENT:     Head: Normocephalic and atraumatic.  Cardiovascular:     Rate and Rhythm: Normal rate. Rhythm irregular.  Pulmonary:     Effort: Pulmonary effort is normal. No respiratory distress.     Breath sounds: No wheezing or rales.  Abdominal:     General: There is no distension.     Palpations: Abdomen is soft.  Musculoskeletal:        General: No tenderness.     Cervical back: Normal range of motion and neck supple.     Right lower leg: Edema (2+ pitting) present.     Left lower leg: Edema (2+ pitting) present.  Skin:    General: Skin is warm and dry.  Neurological:     Mental Status: He is alert and oriented to person, place, and time.     Comments: Expressive aphasia  Psychiatric:        Mood and Affect: Mood normal.        Behavior: Behavior  normal.        Thought Content: Thought content normal.      Assessment & Plan:  1: Chronic heart failure with reduced ejection fraction- - NYHA class I - euvolemic today - weighing daily; reminded to call for an overnight weight gain of >2 pounds or a weekly weight gain of >5 pounds - not adding salt and brother that is present says that they don't cook with salt when they bring him food; reviewed the importance of closely following a low sodium diet and written dietary information along with a low sodium cookbook were given to him - BNP 07/12/19 was 1991.9 - unable to titrate entresto or resume carvedilol due to BP - reports receiving both COVID vaccines  2: HTN- -  BP on the low side today (98/74) but he's without dizziness - follows with PCP ([facility]) - BMP 07/18/19 reviewed and showed sodium 138, potassium 3.9, creatinine 1.09 and GFR >60  3: Permanent atrial fibrillation- - saw cardiology Sanjuana Kava) 04/21/19; brother says they have to schedule f/u with him  4: CVA- - residual expressive aphasia - has right-sided weakness but is able to move around and currently lives by himself with family close by  5: Lymphedema- - stage 2 - limited in his ability to exercise due to right sided weakness - doesn't have compression socks but has worn them in the past; brother instructed to get him some, apply them in the morning with removal at bedtime - elevate his legs when sitting for long periods of time - consider lymphapress compression boots if edema persists   Patient did not bring his medications nor a list. Each medication was verbally reviewed with the patient and he was encouraged to bring the bottles to every visit to confirm accuracy of list.  Return in 1 month or sooner for any questions/problems before then.

## 2019-08-10 ENCOUNTER — Ambulatory Visit: Payer: Medicaid Other | Attending: Family | Admitting: Family

## 2019-08-10 ENCOUNTER — Other Ambulatory Visit: Payer: Self-pay

## 2019-08-10 ENCOUNTER — Encounter: Payer: Self-pay | Admitting: Family

## 2019-08-10 VITALS — BP 98/74 | HR 73 | Resp 18 | Ht 72.0 in | Wt 150.0 lb

## 2019-08-10 DIAGNOSIS — Z7984 Long term (current) use of oral hypoglycemic drugs: Secondary | ICD-10-CM | POA: Insufficient documentation

## 2019-08-10 DIAGNOSIS — Z7901 Long term (current) use of anticoagulants: Secondary | ICD-10-CM | POA: Diagnosis not present

## 2019-08-10 DIAGNOSIS — I4821 Permanent atrial fibrillation: Secondary | ICD-10-CM | POA: Insufficient documentation

## 2019-08-10 DIAGNOSIS — I110 Hypertensive heart disease with heart failure: Secondary | ICD-10-CM | POA: Diagnosis present

## 2019-08-10 DIAGNOSIS — Z7982 Long term (current) use of aspirin: Secondary | ICD-10-CM | POA: Diagnosis not present

## 2019-08-10 DIAGNOSIS — I509 Heart failure, unspecified: Secondary | ICD-10-CM | POA: Diagnosis not present

## 2019-08-10 DIAGNOSIS — I69351 Hemiplegia and hemiparesis following cerebral infarction affecting right dominant side: Secondary | ICD-10-CM | POA: Diagnosis not present

## 2019-08-10 DIAGNOSIS — I2510 Atherosclerotic heart disease of native coronary artery without angina pectoris: Secondary | ICD-10-CM | POA: Diagnosis not present

## 2019-08-10 DIAGNOSIS — Z79899 Other long term (current) drug therapy: Secondary | ICD-10-CM | POA: Insufficient documentation

## 2019-08-10 DIAGNOSIS — I890 Lymphedema, not elsewhere classified: Secondary | ICD-10-CM | POA: Diagnosis not present

## 2019-08-10 DIAGNOSIS — I4892 Unspecified atrial flutter: Secondary | ICD-10-CM | POA: Insufficient documentation

## 2019-08-10 DIAGNOSIS — I69320 Aphasia following cerebral infarction: Secondary | ICD-10-CM

## 2019-08-10 DIAGNOSIS — I10 Essential (primary) hypertension: Secondary | ICD-10-CM

## 2019-08-10 DIAGNOSIS — E119 Type 2 diabetes mellitus without complications: Secondary | ICD-10-CM | POA: Insufficient documentation

## 2019-08-10 DIAGNOSIS — I429 Cardiomyopathy, unspecified: Secondary | ICD-10-CM | POA: Diagnosis not present

## 2019-08-10 DIAGNOSIS — I4819 Other persistent atrial fibrillation: Secondary | ICD-10-CM

## 2019-08-10 DIAGNOSIS — I5022 Chronic systolic (congestive) heart failure: Secondary | ICD-10-CM

## 2019-08-10 NOTE — Patient Instructions (Addendum)
Continue weighing daily and call for an overnight weight gain of > 2 pounds or a weekly weight gain of >5 pounds.   Apply compression socks daily with removal at bedtime. Elevate legs when sitting for long periods of time

## 2019-08-11 ENCOUNTER — Other Ambulatory Visit: Payer: Self-pay

## 2019-08-11 ENCOUNTER — Ambulatory Visit (INDEPENDENT_AMBULATORY_CARE_PROVIDER_SITE_OTHER): Payer: Medicaid Other | Admitting: Podiatry

## 2019-08-11 ENCOUNTER — Encounter: Payer: Self-pay | Admitting: Podiatry

## 2019-08-11 DIAGNOSIS — M79675 Pain in left toe(s): Secondary | ICD-10-CM

## 2019-08-11 DIAGNOSIS — R0989 Other specified symptoms and signs involving the circulatory and respiratory systems: Secondary | ICD-10-CM

## 2019-08-11 DIAGNOSIS — L84 Corns and callosities: Secondary | ICD-10-CM | POA: Diagnosis not present

## 2019-08-11 DIAGNOSIS — E1151 Type 2 diabetes mellitus with diabetic peripheral angiopathy without gangrene: Secondary | ICD-10-CM

## 2019-08-11 DIAGNOSIS — B351 Tinea unguium: Secondary | ICD-10-CM | POA: Diagnosis not present

## 2019-08-11 DIAGNOSIS — M79674 Pain in right toe(s): Secondary | ICD-10-CM | POA: Diagnosis not present

## 2019-08-11 DIAGNOSIS — Z8673 Personal history of transient ischemic attack (TIA), and cerebral infarction without residual deficits: Secondary | ICD-10-CM

## 2019-08-12 NOTE — Progress Notes (Signed)
Subjective:  Patient ID: [name], male    DOB: May 15, 1955,  MRN: [id number]  64 y.o. male presents with preventative diabetic foot care, for annual diabetic foot examination and painful thick toenails that are difficult to trim. Pain interferes with ambulation. Aggravating factors include wearing enclosed shoe gear. Pain is relieved with periodic professional debridement.   His niece is present during today's visit. States she did not bring him to his last appointment due to other medical issues he was having and she forgot to call and cancel appointment.  Review of Systems: Negative except as noted in the HPI.  Past Medical History:  Diagnosis Date  . Atrial flutter (HCC)   . CHF (congestive heart failure) (HCC)   . Diabetes mellitus   . Hypertension   . 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 Stroke [facility])    Past Surgical History:  Procedure Laterality Date  . CARDIOVERSION N/A 10/10/2016   Procedure: CARDIOVERSION;  Surgeon: Lewayne Bunting, MD;  Location: [facility] ENDOSCOPY;  Service: Cardiovascular;  Laterality: N/A;  . RIGHT/LEFT HEART CATH AND CORONARY ANGIOGRAPHY N/A 09/06/2016   Procedure: RIGHT/LEFT HEART CATH AND CORONARY ANGIOGRAPHY;  Surgeon: Lennette Bihari, MD;  Location: [facility] INVASIVE CV LAB;  Service: Cardiovascular;  Laterality: N/A;  . TRACHEOSTOMY     Patient Active Problem List   Diagnosis Date Noted  . NICM (nonischemic cardiomyopathy) (HCC) 07/16/2019  . Permanent atrial fibrillation (HCC)   . Acute systolic CHF (congestive heart failure) (HCC) 08/01/2017  . Acute CHF (congestive heart failure) (HCC) 08/01/2017  . Acute on chronic HFrEF (heart failure with reduced ejection fraction) (HCC)   . Persistent atrial fibrillation (HCC)   . Essential hypertension 07/15/2017  . Type 2 diabetes mellitus without complication, without long-term current use of insulin (HCC)  09/09/2016  . Acute systolic (congestive) heart failure (HCC) 09/07/2016  . Atrial flutter (HCC) 09/04/2016  . Cardiomyopathy (HCC) 06/02/2008  . ABNORMAL EKG 05/02/2008  . DIABETES MELLITUS, TYPE II 04/28/2008  . HLD (hyperlipidemia) 04/28/2008  . MIGRAINE HEADACHE 04/28/2008  . History of stroke 04/28/2008    Current Outpatient Medications:  .  acetaminophen (TYLENOL) 500 MG tablet, Take 1,000 mg by mouth every 6 (six) hours as needed for pain., Disp: , Rfl:  .  apixaban (ELIQUIS) 5 MG TABS tablet, Take 1 tablet (5 mg total) by mouth 2 (two) times daily., Disp: 60 tablet, Rfl: 10 .  aspirin EC 81 MG EC tablet, Take 1 tablet (81 mg total) by mouth daily., Disp: , Rfl:  .  furosemide (LASIX) 40 MG tablet, Take 1 tablet (40 mg total) by mouth 2 (two) times daily., Disp: 30 tablet, Rfl: 0 .  glipiZIDE (GLUCOTROL) 10 MG tablet, Take 1 tablet (10 mg total) by mouth daily with breakfast., Disp: 30 tablet, Rfl: 6 .  loratadine (CLARITIN) 10 MG tablet, TK 1 T PO D, Disp: , Rfl:  .  metFORMIN (GLUCOPHAGE) 1000 MG tablet, Take 1,000 mg by mouth 2 (two) times daily., Disp: , Rfl:  .  Multiple Vitamin (MULTIVITAMIN WITH MINERALS) TABS, Take 1 tablet by mouth daily., Disp: , Rfl:  .  omega-3 acid ethyl esters (LOVAZA) 1 G capsule, Take 1 g by mouth daily., Disp: , Rfl:  .  sacubitril-valsartan (ENTRESTO) 24-26 MG, Take 1 tablet by mouth 2 (two) times daily., Disp: 60 tablet, Rfl: 1 .  simvastatin (ZOCOR) 40 MG tablet, Take 40 mg by  mouth daily., Disp: , Rfl:  No Known Allergies Social History   Tobacco Use  Smoking Status Never Smoker  Smokeless Tobacco Never Used   Objective:  There were no vitals filed for this visit. Constitutional Patient is a pleasant 64 y.o. African American male in NAD.  AAO x 3.  Vascular Capillary fill time to digits <3 seconds b/l lower extremities. Nonpalpable DP pulse(s) b/l lower extremities. Nonpalpable PT pulse(s) b/l lower extremities. Pedal hair absent. Lower  extremity skin temperature gradient within normal limits. No ischemia or gangrene noted b/l lower extremities. No cyanosis or clubbing noted.  Neurologic Status post CVA with speech deficit. Oriented to person, place, and time. Protective sensation intact 5/5 intact bilaterally with 10g monofilament b/l. Vibratory sensation intact b/l. Clonus negative b/l.  Dermatologic Pedal skin is thin shiny, atrophic b/l lower extremities. No open wounds bilaterally. No interdigital macerations bilaterally. Toenails 1-5 b/l elongated, discolored, dystrophic, thickened, crumbly with subungual debris and tenderness to dorsal palpation. Hyperkeratotic lesion(s) R 5th toe.  No erythema, no edema, no drainage, no flocculence.  Orthopedic: Muscle strength 5/5 to all LE muscle groups of left lower extremity. No pain crepitus or joint limitation noted with ROM b/l. Hammertoes noted to the R 5th toe. Utilizes wheelchair for mobility assistance. Spastic hemiplegia RLE.   Hemoglobin A1C Latest Ref Rng & Units 07/16/2019  HGBA1C 4.8 - 5.6 % 6.0(H)  Some recent data might be hidden    Assessment:  No diagnosis found. Plan:  Patient was evaluated and treated and all questions answered.  Onychomycosis with pain -Nails palliatively debridement as below. -Educated on self-care  Procedure: Nail Debridement Rationale: Pain Type of Debridement: manual, sharp debridement. Instrumentation: Nail nipper, rotary burr. Number of Nails: 10  -Examined patient. -Medicaid ABN signed for this year. Niece consented to corn paring of right 5th digit. Copy given to patient on today's visit and copy placed in patient chart. -Diabetic foot examination performed on today's visit. -Continue diabetic foot care principles. -Due to patient being s/p CVA with right sided hemiplegia, we discussed pressure precautions regarding heel protection to prevent decubitus ulcerations. Discussed heel protectors and what areas of the feet to inspect  when checking for pressure sores. Niece related understanding. -Toenails 1-5 b/l were debrided in length and girth with sterile nail nippers and dremel without iatrogenic bleeding.  -Corn(s) R 5th toe pared utilizing sterile scalpel blade without complication or incident. Total number debrided=1. -Patient to report any pedal injuries to medical professional immediately. -Recommend and will order noninvasive vascular studies (ABIs with segmental pressures) for diminished pedal pulses b/l. -Patient to continue soft, supportive shoe gear daily. -Patient/POA to call should there be question/concern in the interim.  Return in about 3 months (around 11/11/2019) for nail trim.  Freddie Breech, DPM

## 2019-08-13 ENCOUNTER — Telehealth: Payer: Self-pay | Admitting: *Deleted

## 2019-08-13 NOTE — Telephone Encounter (Signed)
Left message on home phone requesting call back with insurance information.

## 2019-08-13 NOTE — Telephone Encounter (Signed)
[employer]-Teresa S. (10:29am) states pt may now be in managed care. [employer] - [employer]-Tiffany (10:36am) states she can not find pt in this systems.

## 2019-08-13 NOTE — Telephone Encounter (Signed)
I spoke with pt's niece,[name] and told her I needed his current insurance information. [name] states she is driving and will need to call back. I told [name] I had left my number on his phone or she could contact the main office number and they would transfer to me.

## 2019-08-13 NOTE — Telephone Encounter (Signed)
-----   Message from Freddie Breech, DPM sent at 08/12/2019  9:59 PM EDT ----- Order ABIs with segmential pressures for diminished pedal pulses. Patient has established Cardiologist.Thanks!

## 2019-08-17 ENCOUNTER — Telehealth: Payer: Self-pay | Admitting: *Deleted

## 2019-08-17 NOTE — Telephone Encounter (Signed)
Left message for pt's niece Festus Barren to call with pt's up dated insurance information.

## 2019-08-17 NOTE — Telephone Encounter (Signed)
Entered in error

## 2019-09-10 ENCOUNTER — Ambulatory Visit: Payer: Medicaid Other | Admitting: Family

## 2019-09-23 ENCOUNTER — Other Ambulatory Visit: Payer: Self-pay

## 2019-09-23 ENCOUNTER — Telehealth: Payer: Self-pay | Admitting: Cardiovascular Disease

## 2019-09-23 MED ORDER — FUROSEMIDE 40 MG PO TABS: 40.0000 mg | ORAL_TABLET | Freq: Two times a day (BID) | ORAL | 2 refills | Status: DC

## 2019-09-23 MED ORDER — ENTRESTO 24-26 MG PO TABS: 1.0000 | ORAL_TABLET | Freq: Two times a day (BID) | ORAL | 3 refills | Status: DC

## 2019-09-23 NOTE — Telephone Encounter (Signed)
Pt's niece called and stated that they need to speak with a nurse regarding the patient's medication. Some things need to be clarified. Please call back

## 2019-09-23 NOTE — Telephone Encounter (Signed)
Spoke w the patient's niece (DPR) she had questions about medications.  We clarified that per the dc instructions that patient is to take lasix 40 mg twice a day and entresto 24-26 mg twice a day.  No coreg.  This is what he has been doing.  He was seen in [facility] Clinic at [facility].  No changes were made according to the progress note. They need refills of lasix and entresto at the current dose.  These have been sent.  The patient is scheduled w Dr. [name] 9/29.  Will keep track of BP at home and bring list of readings w patient to this appt.  Weights are stable.  No new or worse SOB.

## 2019-09-23 NOTE — Telephone Encounter (Signed)
Pt's medication was sent to pt's pharmacy as requested. Confirmation received.  °

## 2019-10-05 NOTE — Progress Notes (Signed)
Chief Complaint  Patient presents with   Follow-up    chronic systolic CHF   History of Present Illness: 64 yo male with history of CVA in 2010, expressive aphasia, chronic right sided weakness, HTN, DM, non-ischemic cardiomyopathy, chronic systolic CHF and atrial flutter who is here today for cardiac follow up. He was admitted to [facility] 09/04/16 with atrial flutter and was found to have LVEF=20%. Cardiac cath 09/06/16 with mild non-obstructive CAD. He was felt to have a non-ischemic cardiomyopathy. His heart rate was controlled on a low dose beta blocker. He was started on Eliquis. He was also started on Entresto. DCCV at [facility] 10/10/16. LVEF improved to 45-50% by echo November 2018. He was seen in our office 07/15/17 and was back in atrial flutter with heart rate in the 50s.  He was admitted to [facility] 07/31/17 with CHF and diuresed quickly with IV Lasix. I saw him in the office in April 2021 and he had mild volume overload. Lasix was increased. Echo April 2021 with LVEF=20-25%, global hypokinesis, mild MR. He was admitted to [facility] July 2021 with volume overload. He was diuresed with IV Lasix. Coreg stopped due to hypotension.   He is here today for follow up. The patient denies any chest pain, dyspnea, palpitations, lower extremity edema, orthopnea, PND, dizziness, near syncope or syncope.   Primary Care Physician: Mirna Mires, MD  Past Medical History:  Diagnosis Date   Atrial flutter [facility])    CHF (congestive heart failure) (HCC)    Diabetes mellitus    Hypertension    NICM (nonischemic cardiomyopathy) (HCC) 08/2016   Mild, nonobstructive CAD at cath, EF initially 20%, improved to 50% 11/2016 echo   Stroke [facility])     Past Surgical History:  Procedure Laterality Date   CARDIOVERSION N/A 10/10/2016   Procedure: CARDIOVERSION;  Surgeon: Lewayne Bunting, MD;  Location: [facility] ENDOSCOPY;  Service: Cardiovascular;  Laterality: N/A;   RIGHT/LEFT HEART CATH AND CORONARY ANGIOGRAPHY N/A 09/06/2016    Procedure: RIGHT/LEFT HEART CATH AND CORONARY ANGIOGRAPHY;  Surgeon: Lennette Bihari, MD;  Location: [facility] INVASIVE CV LAB;  Service: Cardiovascular;  Laterality: N/A;   TRACHEOSTOMY      Current Outpatient Medications  Medication Sig Dispense Refill   Accu-Chek FastClix Lancets MISC 1 each by Other route as directed.     ACCU-CHEK GUIDE test strip 1 each by Other route See admin instructions.     acetaminophen (TYLENOL) 500 MG tablet Take 1,000 mg by mouth every 6 (six) hours as needed for pain.     apixaban (ELIQUIS) 5 MG TABS tablet Take 1 tablet (5 mg total) by mouth 2 (two) times daily. 60 tablet 10   aspirin EC 81 MG EC tablet Take 1 tablet (81 mg total) by mouth daily.     furosemide (LASIX) 40 MG tablet Take 1 tablet (40 mg total) by mouth 2 (two) times daily. 180 tablet 2   glipiZIDE (GLUCOTROL) 10 MG tablet Take 1 tablet (10 mg total) by mouth daily with breakfast. 30 tablet 6   loratadine (CLARITIN) 10 MG tablet TK 1 T PO D     metFORMIN (GLUCOPHAGE) 1000 MG tablet Take 1,000 mg by mouth 2 (two) times daily.     Multiple Vitamin (MULTIVITAMIN WITH MINERALS) TABS Take 1 tablet by mouth daily.     omega-3 acid ethyl esters (LOVAZA) 1 G capsule Take 1 g by mouth daily.     sacubitril-valsartan (ENTRESTO) 24-26 MG Take 1 tablet by mouth 2 (two) times  daily. 60 tablet 3   simvastatin (ZOCOR) 40 MG tablet Take 40 mg by mouth daily.     metoprolol succinate (TOPROL XL) 25 MG 24 hr tablet Take 1 tablet (25 mg total) by mouth daily. 90 tablet 3   No current facility-administered medications for this visit.    No Known Allergies  Social History   Socioeconomic History   Marital status: Single    Spouse name: Not on file   Number of children: Not on file   Years of education: Not on file   Highest education level: Not on file  Occupational History   Not on file  Tobacco Use   Smoking status: Never Smoker   Smokeless tobacco: Never Used  Vaping Use    Vaping Use: Never used  Substance and Sexual Activity   Alcohol use: No   Drug use: No   Sexual activity: Not Currently  Other Topics Concern   Not on file  Social History Narrative   Not on file   Social Determinants of Health   Financial Resource Strain:    Difficulty of Paying Living Expenses: Not on file  Food Insecurity:    Worried About Running Out of Food in the Last Year: Not on file   Ran Out of Food in the Last Year: Not on file  Transportation Needs:    Lack of Transportation (Medical): Not on file   Lack of Transportation (Non-Medical): Not on file  Physical Activity:    Days of Exercise per Week: Not on file   Minutes of Exercise per Session: Not on file  Stress:    Feeling of Stress : Not on file  Social Connections:    Frequency of Communication with Friends and Family: Not on file   Frequency of Social Gatherings with Friends and Family: Not on file   Attends Religious Services: Not on file   Active Member of Clubs or Organizations: Not on file   Attends Banker Meetings: Not on file   Marital Status: Not on file  Intimate Partner Violence:    Fear of Current or Ex-Partner: Not on file   Emotionally Abused: Not on file   Physically Abused: Not on file   Sexually Abused: Not on file    Family History  Problem Relation Age of Onset   Hypertension Mother    Diabetes Mother    Heart disease Father     Review of Systems:  As stated in the HPI and otherwise negative.   BP (!) 120/58    Pulse 77    Ht 6' (1.829 m)    Wt 145 lb 3.2 oz (65.9 kg)    SpO2 93%    BMI 19.69 kg/m   Physical Examination:  General: Well developed, well nourished, NAD  HEENT: OP clear, mucus membranes moist  SKIN: warm, dry. No rashes. Neuro: No focal deficits  Musculoskeletal: Muscle strength 5/5 all ext  Psychiatric: Mood and affect normal  Neck: No JVD, no carotid bruits, no thyromegaly, no lymphadenopathy.  Lungs:Clear bilaterally,  no wheezes, rhonci, crackles Cardiovascular: Irregular. No murmurs, gallops or rubs. Abdomen:Soft. Bowel sounds present. Non-tender.  Extremities: No lower extremity edema. Pulses are 2 + in the bilateral DP/PT.  Echo April 2021: 1. LVEF is severely epressed with diffuse hypokinesis, worse in the  septal, apical, anterior and inferior walls There is basal inferior  akinesis. Compared to echo report of 2018, LVEF is worse.. Left  ventricular ejection fraction, by estimation, is 20  to  25%. There is moderate left ventricular hypertrophy. Left ventricular  diastolic parameters are indeterminate.  2. Right ventricular systolic function is mildly reduced. The right  ventricular size is normal. There is mildly elevated pulmonary artery  systolic pressure.  3. Left atrial size was severely dilated.  4. Right atrial size was severely dilated.  5. The mitral valve is abnormal. Mild mitral valve regurgitation.  6. Tricuspid valve regurgitation is moderate.  7. The aortic valve is normal in structure. Aortic valve regurgitation is  not visualized.   Cardiac cath 09/06/16: Diagnostic Diagram        EKG:  EKG is no ordered today. The ekg ordered today demonstrates   Recent Labs: 07/12/2019: ALT 18; B Natriuretic Peptide 1,991.9 07/13/2019: TSH 4.196 07/15/2019: Magnesium 1.9 07/18/2019: BUN 23; Creatinine, Ser 1.09; Potassium 3.9; Sodium 138 07/19/2019: Hemoglobin 11.6; Platelets 171   Lipid Panel    Component Value Date/Time   CHOL 93 (L) 05/07/2019 0945   TRIG 59 05/07/2019 0945   HDL 29 (L) 05/07/2019 0945   CHOLHDL 3.2 05/07/2019 0945   CHOLHDL 3.9 Ratio 07/07/2007 2028   VLDL 28 07/07/2007 2028   LDLCALC 50 05/07/2019 0945     Wt Readings from Last 3 Encounters:  10/06/19 145 lb 3.2 oz (65.9 kg)  08/10/19 150 lb (68 kg)  07/19/19 150 lb 12.7 oz (68.4 kg)     Other studies Reviewed: Additional studies/ records that were reviewed today include: . Review of the above  records demonstrates:   Assessment and Plan:   1. Atrial fib/flutter: Atrial fib today. Rate is controlled. Will continue Eliquis.   Start Toprol 25 mg po daily now.    2. Non-ischemic cardiomyopathy: LVEF is now 25%. He is NYHA class 2. Will continue Entresto.  Coreg has been on hold. BP at home 100-110 systolic. Will not restart Coreg. Will try Toprol 25 mg po daily.   3. HLD: LDL at goal in April 2021. Continue statin.     4. CAD without angina: No chest pain. Continue ASA and statin.  Will add Toprol today  5. Chronic systolic CHF: Volume status is better. Weight stable. Continue Lasix 40 mg po BID  Current medicines are reviewed at length with the patient today.  The patient does not have concerns regarding medicines.  The following changes have been made:  no change  Labs/ tests ordered today include:   No orders of the defined types were placed in this encounter.  Disposition:   Follow up with me in 6 months  Signed, Verne Carrow, MD 10/06/2019 9:51 AM    [facility] [address] [place], [place], [place]  [address] Phone: [phone]; Fax: [phone]

## 2019-10-06 ENCOUNTER — Telehealth: Payer: Self-pay

## 2019-10-06 ENCOUNTER — Ambulatory Visit (INDEPENDENT_AMBULATORY_CARE_PROVIDER_SITE_OTHER): Payer: Medicaid Other | Admitting: Cardiovascular Disease

## 2019-10-06 ENCOUNTER — Encounter: Payer: Self-pay | Admitting: Cardiovascular Disease

## 2019-10-06 ENCOUNTER — Other Ambulatory Visit: Payer: Self-pay

## 2019-10-06 VITALS — BP 120/58 | HR 77 | Ht 72.0 in | Wt 145.2 lb

## 2019-10-06 DIAGNOSIS — I428 Other cardiomyopathies: Secondary | ICD-10-CM | POA: Diagnosis not present

## 2019-10-06 DIAGNOSIS — I4819 Other persistent atrial fibrillation: Secondary | ICD-10-CM

## 2019-10-06 DIAGNOSIS — I2510 Atherosclerotic heart disease of native coronary artery without angina pectoris: Secondary | ICD-10-CM | POA: Diagnosis not present

## 2019-10-06 DIAGNOSIS — I5022 Chronic systolic (congestive) heart failure: Secondary | ICD-10-CM

## 2019-10-06 DIAGNOSIS — E785 Hyperlipidemia, unspecified: Secondary | ICD-10-CM | POA: Diagnosis not present

## 2019-10-06 MED ORDER — METOPROLOL SUCCINATE ER 25 MG PO TB24
25.0000 mg | ORAL_TABLET | Freq: Every day | ORAL | 3 refills | Status: DC
Start: 2019-10-06 — End: 2019-11-16

## 2019-10-06 NOTE — Telephone Encounter (Signed)
**Note De-Identified Vangie Henthorn Obfuscation** I started a Entresto PA through covermymeds: Key: [id number]

## 2019-10-06 NOTE — Telephone Encounter (Addendum)
**Note De-identified Tameika Heckmann Obfuscation** -----  **Note De-Identified Elye Harmsen Obfuscation** Message from Lendon Ka, RN sent at 10/06/2019  9:12 AM EDT ----- Regarding: prior auth for Entresto  This patient saw Dr. [name] today and brought a letter from [facility] that he needs prior auth for [name].  Can you help with this?

## 2019-10-06 NOTE — Patient Instructions (Signed)
Medication Instructions:  Your physician has recommended you make the following change in your medication:   1.) start metoprolol succinate (Toprol XL) 25 mg - take one tablet daily  *If you need a refill on your cardiac medications before your next appointment, please call your pharmacy*   Lab Work: none  Testing/Procedures: none  Follow-Up: At [facility], you and your health needs are our priority.  As part of our continuing mission to provide you with exceptional heart care, we have created designated Provider Care Teams.  These Care Teams include your primary Cardiologist (physician) and Advanced Practice Providers (APPs -  Physician Assistants and Nurse Practitioners) who all work together to provide you with the care you need, when you need it.  We recommend signing up for the patient portal called "MyChart".  Sign up information is provided on this After Visit Summary.  MyChart is used to connect with patients for Virtual Visits (Telemedicine).  Patients are able to view lab/test results, encounter notes, upcoming appointments, etc.  Non-urgent messages can be sent to your provider as well.   To learn more about what you can do with MyChart, go to [email or web address].    Your next appointment:   6 month(s)  The format for your next appointment:   In Person  Provider:   You may see Verne Carrow, MD or one of the following Advanced Practice Providers on your designated Care Team:    Ronie Spies, PA-C  Jacolyn Reedy, PA-C    Other Instructions

## 2019-10-08 NOTE — Telephone Encounter (Signed)
**Note De-Identified Tamre Cass Obfuscation** Per the [employer] PA denial letter received Chaitra Mast fax from [facility] I called them to appeal. Appeal was done with Nate B. over the phone and a approval was obtained. Entresto 24-26 mg PA approval is valid until 10/07/1020. PA#: [id number]  I have notified [facility] pharmacy of this approval.

## 2019-10-18 ENCOUNTER — Telehealth: Payer: Self-pay | Admitting: Cardiovascular Disease

## 2019-10-18 DIAGNOSIS — I5022 Chronic systolic (congestive) heart failure: Secondary | ICD-10-CM

## 2019-10-18 NOTE — Telephone Encounter (Signed)
Returned call to [facility].  Advised per Dr. [name] to increase lasix to 80 mg BID for 3 days.  Advised EC if swelling has not improved after the increased lasix over 3 days to call before going back to 40 mg BID.  EC indicates understanding.  Will call if fluid is not better in 3 days.

## 2019-10-18 NOTE — Telephone Encounter (Signed)
Pt c/o swelling: STAT is pt has developed SOB within 24 hours  1) How much weight have you gained and in what time span? 5 lbs in 3 days   2) If swelling, where is the swelling located? No.  Stomach area he looks swollen.   3) Are you currently taking a fluid pill? Yes   4) Are you currently SOB? Gets SOB when up and moving.  This has been happening for the last 3-4 days  5) Do you have a log of your daily weights (if so, list)? She does, but not in front of her  6) Have you gained 3 pounds in a day or 5 pounds in a week? Gained 5lbs in 3 days  7) Have you traveled recently? No

## 2019-10-18 NOTE — Telephone Encounter (Signed)
I would have him take Lasix 80 mg po BID for 3 days and then back to 40 mg BID if swelling resolves and weight goes down. Thanks, chris

## 2019-11-02 ENCOUNTER — Encounter: Payer: Self-pay | Admitting: Cardiovascular Disease

## 2019-11-02 NOTE — Telephone Encounter (Signed)
We can try Lasix 80 mg am and 40 mg pm on a daily basis. BMET in one week. Thanks, chris

## 2019-11-02 NOTE — Telephone Encounter (Signed)
error 

## 2019-11-02 NOTE — Telephone Encounter (Signed)
Left message for [name] to call back.

## 2019-11-02 NOTE — Telephone Encounter (Signed)
Niece reports pt did well when increased lasix for 3 days. (took 80 mg BID x 3 days) As of today gained 5 pounds again, more SOB again.   HR is WNL and BP this am 115/82.   No real diet changes.  Using salt substitute, family preparing foods.  Aware I will send message to Dr. [name] to consider any further med adjustments and will call her back.

## 2019-11-02 NOTE — Telephone Encounter (Signed)
Patient's niece states the patient is still retaining fluid since they increased his fluid pill lasix. She states it cleared up for a week, but now he is back to breathing hard and having SOB. Please advise.

## 2019-11-03 MED ORDER — FUROSEMIDE 40 MG PO TABS
ORAL_TABLET | ORAL | 3 refills | Status: DC
Start: 2019-11-03 — End: 2019-11-16

## 2019-11-03 NOTE — Telephone Encounter (Signed)
[name] is returning Michalene's phone call. Let her know someone would call her when they get an opportunity

## 2019-11-03 NOTE — Telephone Encounter (Signed)
Spoke with patient's niece and informed.  Voice understanding and agreement.

## 2019-11-04 ENCOUNTER — Inpatient Hospital Stay (HOSPITAL_COMMUNITY)
Admission: EM | Admit: 2019-11-04 | Discharge: 2019-11-16 | DRG: 286 | Disposition: A | Discharge status: Hospice | Payer: Medicaid Other | Attending: Family Medicine | Admitting: Family Medicine

## 2019-11-04 ENCOUNTER — Emergency Department (HOSPITAL_COMMUNITY): Payer: Medicaid Other

## 2019-11-04 ENCOUNTER — Encounter (HOSPITAL_COMMUNITY): Payer: Self-pay

## 2019-11-04 ENCOUNTER — Other Ambulatory Visit: Payer: Self-pay

## 2019-11-04 DIAGNOSIS — I472 Ventricular tachycardia: Secondary | ICD-10-CM | POA: Diagnosis not present

## 2019-11-04 DIAGNOSIS — I4892 Unspecified atrial flutter: Secondary | ICD-10-CM | POA: Diagnosis present

## 2019-11-04 DIAGNOSIS — Z833 Family history of diabetes mellitus: Secondary | ICD-10-CM

## 2019-11-04 DIAGNOSIS — Z8673 Personal history of transient ischemic attack (TIA), and cerebral infarction without residual deficits: Secondary | ICD-10-CM

## 2019-11-04 DIAGNOSIS — Z79899 Other long term (current) drug therapy: Secondary | ICD-10-CM

## 2019-11-04 DIAGNOSIS — R188 Other ascites: Secondary | ICD-10-CM | POA: Diagnosis present

## 2019-11-04 DIAGNOSIS — E854 Organ-limited amyloidosis: Secondary | ICD-10-CM | POA: Diagnosis present

## 2019-11-04 DIAGNOSIS — Z20822 Contact with and (suspected) exposure to covid-19: Secondary | ICD-10-CM | POA: Diagnosis present

## 2019-11-04 DIAGNOSIS — R570 Cardiogenic shock: Secondary | ICD-10-CM

## 2019-11-04 DIAGNOSIS — E871 Hypo-osmolality and hyponatremia: Secondary | ICD-10-CM

## 2019-11-04 DIAGNOSIS — I5021 Acute systolic (congestive) heart failure: Secondary | ICD-10-CM | POA: Diagnosis not present

## 2019-11-04 DIAGNOSIS — R001 Bradycardia, unspecified: Secondary | ICD-10-CM | POA: Diagnosis present

## 2019-11-04 DIAGNOSIS — Z7984 Long term (current) use of oral hypoglycemic drugs: Secondary | ICD-10-CM

## 2019-11-04 DIAGNOSIS — E11649 Type 2 diabetes mellitus with hypoglycemia without coma: Secondary | ICD-10-CM | POA: Diagnosis present

## 2019-11-04 DIAGNOSIS — I5082 Biventricular heart failure: Secondary | ICD-10-CM | POA: Diagnosis not present

## 2019-11-04 DIAGNOSIS — I4819 Other persistent atrial fibrillation: Secondary | ICD-10-CM | POA: Diagnosis present

## 2019-11-04 DIAGNOSIS — K761 Chronic passive congestion of liver: Secondary | ICD-10-CM | POA: Diagnosis present

## 2019-11-04 DIAGNOSIS — I2510 Atherosclerotic heart disease of native coronary artery without angina pectoris: Secondary | ICD-10-CM | POA: Diagnosis present

## 2019-11-04 DIAGNOSIS — I509 Heart failure, unspecified: Secondary | ICD-10-CM

## 2019-11-04 DIAGNOSIS — I69351 Hemiplegia and hemiparesis following cerebral infarction affecting right dominant side: Secondary | ICD-10-CM

## 2019-11-04 DIAGNOSIS — E870 Hyperosmolality and hypernatremia: Secondary | ICD-10-CM | POA: Diagnosis present

## 2019-11-04 DIAGNOSIS — R7989 Other specified abnormal findings of blood chemistry: Secondary | ICD-10-CM | POA: Diagnosis present

## 2019-11-04 DIAGNOSIS — R0989 Other specified symptoms and signs involving the circulatory and respiratory systems: Secondary | ICD-10-CM | POA: Diagnosis present

## 2019-11-04 DIAGNOSIS — Z7189 Other specified counseling: Secondary | ICD-10-CM

## 2019-11-04 DIAGNOSIS — E785 Hyperlipidemia, unspecified: Secondary | ICD-10-CM | POA: Diagnosis present

## 2019-11-04 DIAGNOSIS — Z66 Do not resuscitate: Secondary | ICD-10-CM | POA: Diagnosis not present

## 2019-11-04 DIAGNOSIS — Z452 Encounter for adjustment and management of vascular access device: Secondary | ICD-10-CM

## 2019-11-04 DIAGNOSIS — I428 Other cardiomyopathies: Secondary | ICD-10-CM | POA: Diagnosis present

## 2019-11-04 DIAGNOSIS — I110 Hypertensive heart disease with heart failure: Principal | ICD-10-CM | POA: Diagnosis present

## 2019-11-04 DIAGNOSIS — R140 Abdominal distension (gaseous): Secondary | ICD-10-CM

## 2019-11-04 DIAGNOSIS — E875 Hyperkalemia: Secondary | ICD-10-CM | POA: Diagnosis present

## 2019-11-04 DIAGNOSIS — I5023 Acute on chronic systolic (congestive) heart failure: Secondary | ICD-10-CM | POA: Insufficient documentation

## 2019-11-04 DIAGNOSIS — Z7982 Long term (current) use of aspirin: Secondary | ICD-10-CM

## 2019-11-04 DIAGNOSIS — E119 Type 2 diabetes mellitus without complications: Secondary | ICD-10-CM

## 2019-11-04 DIAGNOSIS — Z515 Encounter for palliative care: Secondary | ICD-10-CM

## 2019-11-04 DIAGNOSIS — I69320 Aphasia following cerebral infarction: Secondary | ICD-10-CM

## 2019-11-04 DIAGNOSIS — R1310 Dysphagia, unspecified: Secondary | ICD-10-CM | POA: Diagnosis present

## 2019-11-04 DIAGNOSIS — T68XXXA Hypothermia, initial encounter: Secondary | ICD-10-CM

## 2019-11-04 DIAGNOSIS — Z8249 Family history of ischemic heart disease and other diseases of the circulatory system: Secondary | ICD-10-CM

## 2019-11-04 LAB — BRAIN NATRIURETIC PEPTIDE: B Natriuretic Peptide: 1651 pg/mL — ABNORMAL HIGH (ref 0.0–100.0)

## 2019-11-04 LAB — CBC WITH DIFFERENTIAL/PLATELET
Abs Immature Granulocytes: 0.02 10*3/uL (ref 0.00–0.07)
Basophils Absolute: 0 10*3/uL (ref 0.0–0.1)
Basophils Relative: 1 %
Eosinophils Absolute: 0.2 10*3/uL (ref 0.0–0.5)
Eosinophils Relative: 3 %
HCT: 38.7 % — ABNORMAL LOW (ref 39.0–52.0)
Hemoglobin: 13.4 g/dL (ref 13.0–17.0)
Immature Granulocytes: 0 %
Lymphocytes Relative: 13 %
Lymphs Abs: 0.7 10*3/uL (ref 0.7–4.0)
MCH: 32.2 pg (ref 26.0–34.0)
MCHC: 34.6 g/dL (ref 30.0–36.0)
MCV: 93 fL (ref 80.0–100.0)
Monocytes Absolute: 0.5 10*3/uL (ref 0.1–1.0)
Monocytes Relative: 8 %
Neutro Abs: 4.2 10*3/uL (ref 1.7–7.7)
Neutrophils Relative %: 75 %
Platelets: 216 10*3/uL (ref 150–400)
RBC: 4.16 MIL/uL — ABNORMAL LOW (ref 4.22–5.81)
RDW: 15.8 % — ABNORMAL HIGH (ref 11.5–15.5)
WBC: 5.6 10*3/uL (ref 4.0–10.5)
nRBC: 0 % (ref 0.0–0.2)

## 2019-11-04 LAB — GROUP A STREP BY PCR: Group A Strep by PCR: NOT DETECTED

## 2019-11-04 LAB — RESPIRATORY PANEL BY RT PCR (FLU A&B, COVID)
Influenza A by PCR: NEGATIVE
Influenza B by PCR: NEGATIVE
SARS Coronavirus 2 by RT PCR: NEGATIVE

## 2019-11-04 LAB — COMPREHENSIVE METABOLIC PANEL
ALT: 17 U/L (ref 0–44)
AST: 35 U/L (ref 15–41)
Albumin: 4.3 g/dL (ref 3.5–5.0)
Alkaline Phosphatase: 206 U/L — ABNORMAL HIGH (ref 38–126)
Anion gap: 11 (ref 5–15)
BUN: 24 mg/dL — ABNORMAL HIGH (ref 8–23)
CO2: 21 mmol/L — ABNORMAL LOW (ref 22–32)
Calcium: 9.6 mg/dL (ref 8.9–10.3)
Chloride: 92 mmol/L — ABNORMAL LOW (ref 98–111)
Creatinine, Ser: 1.22 mg/dL (ref 0.61–1.24)
GFR, Estimated: >60 mL/min (ref >60)
Glucose, Bld: 63 mg/dL — ABNORMAL LOW (ref 70–99)
Potassium: 5.6 mmol/L — ABNORMAL HIGH (ref 3.5–5.1)
Sodium: 124 mmol/L — ABNORMAL LOW (ref 135–145)
Total Bilirubin: 1.8 mg/dL — ABNORMAL HIGH (ref 0.3–1.2)
Total Protein: 8.4 g/dL — ABNORMAL HIGH (ref 6.5–8.1)

## 2019-11-04 LAB — CBG MONITORING, ED: Glucose-Capillary: 20 mg/dL — CL (ref 70–99)

## 2019-11-04 LAB — GLUCOSE, CAPILLARY: Glucose-Capillary: 118 mg/dL — ABNORMAL HIGH (ref 70–99)

## 2019-11-04 LAB — TROPONIN I (HIGH SENSITIVITY)
Troponin I (High Sensitivity): 26 ng/L — ABNORMAL HIGH (ref <18)
Troponin I (High Sensitivity): 24 ng/L — ABNORMAL HIGH (ref <18)

## 2019-11-04 LAB — TSH: TSH: 4.367 u[IU]/mL (ref 0.350–4.500)

## 2019-11-04 MED ORDER — ONDANSETRON HCL 4 MG PO TABS: 4.0000 mg | ORAL_TABLET | Freq: Four times a day (QID) | ORAL | Status: DC | PRN

## 2019-11-04 MED ORDER — SIMVASTATIN 20 MG PO TABS
40.0000 mg | ORAL_TABLET | Freq: Every day | ORAL | Status: DC
  Administered 2019-11-05 – 2019-11-07 (×3): 40 mg via ORAL
  Filled 2019-11-04: qty 1
  Filled 2019-11-04: qty 2
  Filled 2019-11-04: qty 1

## 2019-11-04 MED ORDER — LORATADINE 10 MG PO TABS: 10.0000 mg | ORAL_TABLET | Freq: Every day | ORAL | Status: DC | PRN

## 2019-11-04 MED ORDER — FUROSEMIDE 10 MG/ML IJ SOLN
20.0000 mg | Freq: Once | INTRAMUSCULAR | Status: AC
  Administered 2019-11-04: 20 mg via INTRAVENOUS
  Filled 2019-11-04: qty 4

## 2019-11-04 MED ORDER — ONDANSETRON HCL 4 MG/2ML IJ SOLN: 4.0000 mg | Freq: Four times a day (QID) | INTRAMUSCULAR | Status: DC | PRN

## 2019-11-04 MED ORDER — ACETAMINOPHEN 325 MG PO TABS
650.0000 mg | ORAL_TABLET | Freq: Four times a day (QID) | ORAL | Status: DC | PRN
  Administered 2019-11-05 – 2019-11-15 (×7): 650 mg via ORAL
  Filled 2019-11-04 (×7): qty 2

## 2019-11-04 MED ORDER — FUROSEMIDE 10 MG/ML IJ SOLN
40.0000 mg | Freq: Two times a day (BID) | INTRAMUSCULAR | Status: DC
  Administered 2019-11-05: 40 mg via INTRAVENOUS
  Filled 2019-11-04: qty 4

## 2019-11-04 MED ORDER — SACUBITRIL-VALSARTAN 24-26 MG PO TABS
1.0000 | ORAL_TABLET | Freq: Two times a day (BID) | ORAL | Status: DC
  Administered 2019-11-05: 1 via ORAL
  Filled 2019-11-04 (×2): qty 1

## 2019-11-04 MED ORDER — OXYCODONE HCL 5 MG PO TABS
5.0000 mg | ORAL_TABLET | ORAL | Status: DC | PRN
  Administered 2019-11-14 – 2019-11-16 (×4): 5 mg via ORAL
  Filled 2019-11-04 (×4): qty 1

## 2019-11-04 MED ORDER — LIDOCAINE VISCOUS HCL 2 % MT SOLN: 15.0000 mL | Freq: Four times a day (QID) | OROMUCOSAL | Status: DC | PRN

## 2019-11-04 MED ORDER — ACETAMINOPHEN 650 MG RE SUPP: 650.0000 mg | Freq: Four times a day (QID) | RECTAL | Status: DC | PRN

## 2019-11-04 MED ORDER — INSULIN ASPART 100 UNIT/ML ~~LOC~~ SOLN
0.0000 [IU] | Freq: Three times a day (TID) | SUBCUTANEOUS | Status: DC
  Administered 2019-11-06: 1 [IU] via SUBCUTANEOUS
  Administered 2019-11-08: 2 [IU] via SUBCUTANEOUS
  Administered 2019-11-09: 3 [IU] via SUBCUTANEOUS
  Administered 2019-11-10 – 2019-11-11 (×2): 1 [IU] via SUBCUTANEOUS
  Administered 2019-11-12: 3 [IU] via SUBCUTANEOUS
  Administered 2019-11-12 – 2019-11-13 (×3): 2 [IU] via SUBCUTANEOUS
  Administered 2019-11-14 (×2): 1 [IU] via SUBCUTANEOUS
  Administered 2019-11-14: 2 [IU] via SUBCUTANEOUS
  Administered 2019-11-15 (×2): 5 [IU] via SUBCUTANEOUS
  Administered 2019-11-16: 2 [IU] via SUBCUTANEOUS
  Administered 2019-11-16: 1 [IU] via SUBCUTANEOUS

## 2019-11-04 MED ORDER — ASPIRIN EC 81 MG PO TBEC
81.0000 mg | DELAYED_RELEASE_TABLET | Freq: Every day | ORAL | Status: DC
  Administered 2019-11-05: 81 mg via ORAL
  Filled 2019-11-04: qty 1

## 2019-11-04 MED ORDER — APIXABAN 5 MG PO TABS
5.0000 mg | ORAL_TABLET | Freq: Two times a day (BID) | ORAL | Status: DC
  Administered 2019-11-05 – 2019-11-16 (×24): 5 mg via ORAL
  Filled 2019-11-04 (×23): qty 1

## 2019-11-04 MED ORDER — DEXTROSE 50 % IV SOLN
1.0000 | Freq: Once | INTRAVENOUS | Status: AC
  Administered 2019-11-04: 50 mL via INTRAVENOUS
  Filled 2019-11-04: qty 50

## 2019-11-04 NOTE — ED Provider Notes (Signed)
[name]   CSN: [id number] Arrival date & time: 11/04/19  1726     History Chief Complaint  Patient presents with  . Shortness of Breath  . Sore Throat  . Generalized Body Aches  . abdominal distention    [name] is a 64 y.o. male.  Patient is a 64 year old male with past medical history of nonischemic cardiomyopathy, CHF, and atrial flutter on Eliquis.  He also has a history of diabetes, hypertension, and prior stroke which left him with limited speech.  He presents today for evaluation of shortness of breath, worsening over the past 2 weeks.  He reports a 4-day history of sore throat, body aches, and not feeling well.  He denies any fevers or chills.  He denies any contact with known Covid positive individuals.  Patient has had both doses of Covid vaccine as well as a booster in the past month.  The history is provided by the patient.  Shortness of Breath Severity:  Moderate Onset quality:  Gradual Duration:  2 weeks Timing:  Constant Progression:  Worsening Chronicity:  Recurrent Relieved by:  Nothing Worsened by:  Nothing Sore Throat Associated symptoms include shortness of breath.       Past Medical History:  Diagnosis Date  . Atrial flutter (HCC)   . CHF (congestive heart failure) (HCC)   . Diabetes mellitus   . Hypertension   . 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 Stroke [facility])     Patient Active Problem List   Diagnosis Date Noted  . NICM (nonischemic cardiomyopathy) (HCC) 07/16/2019  . Permanent atrial fibrillation (HCC)   . Acute systolic CHF (congestive heart failure) (HCC) 08/01/2017  . Acute CHF (congestive heart failure) (HCC) 08/01/2017  . Acute on chronic HFrEF (heart failure with reduced ejection fraction) (HCC)   . Persistent atrial fibrillation (HCC)   . Essential hypertension 07/15/2017   . Type 2 diabetes mellitus without complication, without long-term current use of insulin (HCC) 09/09/2016  . Acute systolic (congestive) heart failure (HCC) 09/07/2016  . Atrial flutter (HCC) 09/04/2016  . Cardiomyopathy (HCC) 06/02/2008  . ABNORMAL EKG 05/02/2008  . DIABETES MELLITUS, TYPE II 04/28/2008  . HLD (hyperlipidemia) 04/28/2008  . MIGRAINE HEADACHE 04/28/2008  . History of stroke 04/28/2008    Past Surgical History:  Procedure Laterality Date  . CARDIOVERSION N/A 10/10/2016   Procedure: CARDIOVERSION;  Surgeon: Christopher Bunting, MD;  Location: [facility] ENDOSCOPY;  Service: Cardiovascular;  Laterality: N/A;  . RIGHT/LEFT HEART CATH AND CORONARY ANGIOGRAPHY N/A 09/06/2016   Procedure: RIGHT/LEFT HEART CATH AND CORONARY ANGIOGRAPHY;  Surgeon: Christopher Bihari, MD;  Location: [facility] INVASIVE CV LAB;  Service: Cardiovascular;  Laterality: N/A;  . TRACHEOSTOMY         Family History  Problem Relation Age of Onset  . Hypertension Mother   . Diabetes Mother   . Heart disease Father     Social History   Tobacco Use  . Smoking status: Never Smoker  . Smokeless tobacco: Never Used  Vaping Use  . Vaping Use: Never used  Substance Use Topics  . Alcohol use: No  . Drug use: No    Home Medications Prior to Admission medications   Medication Sig Start Date End Date Taking? Authorizing Provider  Accu-Chek FastClix Lancets MISC 1 each by Other route as directed. 08/19/19   [provider]  ACCU-CHEK GUIDE test strip  1 each by Other route See admin instructions. 09/16/19   [provider]  acetaminophen (TYLENOL) 500 MG tablet Take 1,000 mg by mouth every 6 (six) hours as needed for pain.    [provider]  apixaban (ELIQUIS) 5 MG TABS tablet Take 1 tablet (5 mg total) by mouth 2 (two) times daily. 03/15/19   Christopher Hazel, MD  aspirin EC 81 MG EC tablet Take 1 tablet (81 mg total) by mouth daily. 09/09/16   Christopher Brand, NP  furosemide (LASIX) 40 MG  tablet Take 2 tablets (80 mg) every AM and 1 tablet (40 mg) every PM 11/03/19   Christopher Hazel, MD  glipiZIDE (GLUCOTROL) 10 MG tablet Take 1 tablet (10 mg total) by mouth daily with breakfast. 09/09/16   Christopher Brand, NP  loratadine (CLARITIN) 10 MG tablet TK 1 T PO D 06/29/18   [provider]  metFORMIN (GLUCOPHAGE) 1000 MG tablet Take 1,000 mg by mouth 2 (two) times daily.    [provider]  metoprolol succinate (TOPROL XL) 25 MG 24 hr tablet Take 1 tablet (25 mg total) by mouth daily. 10/06/19   Christopher Hazel, MD  Multiple Vitamin (MULTIVITAMIN WITH MINERALS) TABS Take 1 tablet by mouth daily.    [provider]  omega-3 acid ethyl esters (LOVAZA) 1 G capsule Take 1 g by mouth daily.    [provider]  sacubitril-valsartan (ENTRESTO) 24-26 MG Take 1 tablet by mouth 2 (two) times daily. 09/23/19   Christopher Hazel, MD  simvastatin (ZOCOR) 40 MG tablet Take 40 mg by mouth daily.    [provider]    Allergies    Patient has no known allergies.  Review of Systems   Review of Systems  Respiratory: Positive for shortness of breath.   All other systems reviewed and are negative.   Physical Exam Updated Vital Signs BP 94/83 (BP Location: Right Arm)   Pulse (!) 56   Temp (!) 95.3 F (35.2 C) (Rectal)   Resp (!) 24   Ht 5\' 7"  (1.702 m)   SpO2 95%   BMI 22.74 kg/m   Physical Exam Vitals and nursing [name] reviewed.  Constitutional:      General: He is not in acute distress.    Appearance: He is well-developed. He is not diaphoretic.  HENT:     Head: Normocephalic and atraumatic.     Mouth/Throat:     Mouth: Mucous membranes are moist.     Pharynx: No oropharyngeal exudate.     Comments: There is some erythema of the posterior oropharynx. Cardiovascular:     Rate and Rhythm: Normal rate and regular rhythm.     Heart sounds: No murmur heard.  No friction rub.  Pulmonary:     Effort: Pulmonary effort is  normal. No respiratory distress.     Breath sounds: Normal breath sounds. No wheezing or rales.  Abdominal:     General: Bowel sounds are normal. There is no distension.     Palpations: Abdomen is soft.     Tenderness: There is no abdominal tenderness.  Musculoskeletal:        General: Normal range of motion.     Cervical back: Normal range of motion and neck supple.     Right lower leg: Edema present.     Left lower leg: Edema present.     Comments: There is 1+ edema of both lower extremities.  Skin:    General: Skin is  warm and dry.  Neurological:     Mental Status: He is alert and oriented to person, place, and time.     Coordination: Coordination normal.     ED Results / Procedures / Treatments   Labs (all labs ordered are listed, but only abnormal results are displayed) Labs Reviewed  RESPIRATORY PANEL BY RT PCR (FLU A&B, COVID)  COMPREHENSIVE METABOLIC PANEL  CBC WITH DIFFERENTIAL/PLATELET  BRAIN NATRIURETIC PEPTIDE  TROPONIN I (HIGH SENSITIVITY)    EKG None  Radiology No results found.  Procedures Procedures (including critical care time)  Medications Ordered in ED Medications - No data to display  ED Course  I have reviewed the triage vital signs and the nursing notes.  Pertinent labs & imaging results that were available during my care of the patient were reviewed by me and considered in my medical decision making (see chart for details).    MDM Rules/Calculators/A&P  Patient presenting here complaining with shortness of breath worse over the past 2 weeks, but especially over the last 4 days.  He has history of nonischemic cardiomyopathy/CHF as well as A. fib and is on Eliquis.  Patient arrives here with oxygen saturations in the low 90s which improved with oxygen 2 L by nasal cannula.  Work-up initiated including laboratory studies, EKG, and chest x-ray.  EKG shows no acute changes and laboratory studies are remarkable for a BNP of 1650.  His troponin  is mildly elevated at 26, the significance of which I am uncertain.  Patient's blood pressures somewhat low while here in the ER.  He was cautiously given a low-dose of Lasix due to the elevated BNP and clinical picture suggestive of CHF.  At this point, I feel as though patient will require admission for monitoring of his vital signs and diuresis.  I have spoken with the hospitalist who agrees to admit.  Patient also complaining of sore throat.  Covid test and strep test are negative.  [name] was evaluated in Emergency Department on 11/04/2019 for the symptoms described in the history of present illness. He was evaluated in the context of the global COVID-19 pandemic, which necessitated consideration that the patient might be at risk for infection with the SARS-CoV-2 virus that causes COVID-19. Institutional protocols and algorithms that pertain to the evaluation of patients at risk for COVID-19 are in a state of rapid change based on information released by regulatory bodies including the CDC and federal and state organizations. These policies and algorithms were followed during the patient's care in the ED.  CRITICAL CARE Performed by: [name] Total critical care time: 35 minutes Critical care time was exclusive of separately billable procedures and treating other patients. Critical care was necessary to treat or prevent imminent or life-threatening deterioration. Critical care was time spent personally by me on the following activities: development of treatment plan with patient and/or surrogate as well as nursing, discussions with consultants, evaluation of patient's response to treatment, examination of patient, obtaining history from patient or surrogate, ordering and performing treatments and interventions, ordering and review of laboratory studies, ordering and review of radiographic studies, pulse oximetry and re-evaluation of patient's condition.   Final Clinical  Impression(s) / ED Diagnoses Final diagnoses:  None    Rx / DC Orders ED Discharge Orders    None       Christopher Lyons, MD 11/04/19 2205

## 2019-11-04 NOTE — H&P (Signed)
TRH H&P    Patient Demographics:    [name], is a 64 y.o. male  MRN: [id number]  DOB - 09/03/1955  Admit Date - 11/04/2019  Referring MD/NP/PA: Stark Jock  Outpatient Primary MD for the patient is Iona Beard, MD  Patient coming from: Home  Chief complaint- Dyspnea   HPI:    [name]  is a 64 y.o. 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 fib/a flutter, presents to the ED with a chief complaint of dyspnea.  History is collected from niece, Yvetta Coder, as patient is not able to provide history himself.  She reports that this all started 2 weeks ago when patient started being winded with minimal movement.  At home he does ambulate with a cane.  He gestured to family that he felt short of breath and they could see him working harder to breathe.  They spoke with his cardiologist who recommended increasing his Lasix from 20 mg daily to 40 mg daily for 3 days.  He had improvement in his symptoms for the next 3 days, and then they started to see visible fluid retention.  Niece reports that the fluid retention started in his abdomen and then she noticed it in his thighs and lower legs as well.  She reports that he became short of breath again.  He started to have to use his wheelchair because he was so short of breath.  They called the cardiologist again who recommended 40 mg of Lasix in the morning and 20 mg of Lasix in the evening, but with the state he was and they did not feel comfortable managing this at home and brought him into the ER.  Niece does not think that he has had any chest pain, and he is usually able to indicate that he has pain when his arthritis pain in his shoulders hurts.  She has not noticed any malodorous urine, diarrhea, nausea, vomiting.  Patient has had a normal appetite but has  indicated that his either having trouble swallowing or his throat hurts.  She thinks it is that he has been having trouble swallowing.  Niece thinks because of this he has been drinking more fluid than normal, and decreased his solid food intake.  Of note patient was recently started on metoprolol at home.  The cardiologist informed them that this could bring his blood pressure down so they have been monitoring his blood pressure with a wrist cuff at home.  Niece reports that his blood pressure has been 120s over 70s-80s.  Unfortunately further details about his symptoms cannot be obtained as patient is not able to communicate.  He does indicate that he is not in pain now by nodding no.  Of note-patient's last echo showed a ejection fraction of 20 to 25% with LV hypokinesis diffusely.  Niece at bedside is not sure why he does not have an ICD.  Patient does not smoke, does not drink alcohol does not use illicit drugs.  He has been  fully vaccinated and received a booster for Covid.  Niece thinks that someone else may have discussed CODE STATUS with patient, but no paperwork was filled out and she was not sure the outcome of that conversation.  She reports that she will check on it tomorrow and discuss with day team.  For now patient will remain full code.  In the ED temperature 95.3, heart rate 50, respiratory rate 21, blood pressure 96/61 No leukocytosis with a white blood cell count of 5.6, hemoglobin 13.4 Hypervolemic hyponatremia with a sodium of 124 Hyperkalemia with a potassium of 5.6 BUN is 24, creatinine 1.22 Initial glucose 63, repeat glucose 20 Amp D50 given Negative flu and Covid Negative group A strep EKG shows a heart rate of 47, A. fib, QTC of 337 with minimal ST depression in the anterior leads which has been present on past EKGs Lasix 20 mg given in the ED -gentle diuresis given patient's low blood pressure.  Anticipated the blood pressure will recover upon holding metoprolol, and  larger dose of Lasix will be able to be given in the morning. Troponin XX 6, BNP 1651 Chest x-ray shows cardiomegaly with possible pleural effusions Admission requested for management of diuresis given his hypotension.    Review of systems:    Review of Systems  Unable to perform ROS: Other  Expressive aphasia     Past History of the following :    Past Medical History:  Diagnosis Date  . Atrial flutter ([place])   . CHF (congestive heart failure) ([place])   . Diabetes mellitus   . Hypertension   . 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 Stroke [facility])       Past Surgical History:  Procedure Laterality Date  . CARDIOVERSION N/A 10/10/2016   Procedure: CARDIOVERSION;  Surgeon: Lelon Perla, MD;  Location: [facility] ENDOSCOPY;  Service: Cardiovascular;  Laterality: N/A;  . RIGHT/LEFT HEART CATH AND CORONARY ANGIOGRAPHY N/A 09/06/2016   Procedure: RIGHT/LEFT HEART CATH AND CORONARY ANGIOGRAPHY;  Surgeon: Troy Sine, MD;  Location: [place] CV LAB;  Service: Cardiovascular;  Laterality: N/A;  . TRACHEOSTOMY        Social History:      Social History   Tobacco Use  . Smoking status: Never Smoker  . Smokeless tobacco: Never Used  Substance Use Topics  . Alcohol use: No       Family History :     Family History  Problem Relation Age of Onset  . Hypertension Mother   . Diabetes Mother   . Heart disease Father       Home Medications:   Prior to Admission medications   Medication Sig Start Date End Date Taking? Authorizing Provider  acetaminophen (TYLENOL) 500 MG tablet Take 1,000 mg by mouth every 6 (six) hours as needed for pain.   Yes [provider]  apixaban (ELIQUIS) 5 MG TABS tablet Take 1 tablet (5 mg total) by mouth 2 (two) times daily. 03/15/19  Yes Burnell Blanks, MD  aspirin EC 81 MG EC tablet Take 1 tablet (81 mg total) by mouth daily. 09/09/16   Yes Isaiah Serge, NP  furosemide (LASIX) 40 MG tablet Take 2 tablets (80 mg) every AM and 1 tablet (40 mg) every PM Patient taking differently: Take 40-80 mg by mouth See admin instructions. Take 2 tablets (80 mg) every AM and 1 tablet (40 mg) every PM 11/03/19  Yes Burnell Blanks, MD  glipiZIDE (GLUCOTROL) 10 MG tablet Take 1 tablet (10 mg total) by mouth daily with breakfast. 09/09/16  Yes Isaiah Serge, NP  loratadine (CLARITIN) 10 MG tablet Take 10 mg by mouth daily as needed for allergies.  06/29/18  Yes [provider]  metFORMIN (GLUCOPHAGE) 1000 MG tablet Take 1,000 mg by mouth 2 (two) times daily.   Yes [provider]  metoprolol succinate (TOPROL XL) 25 MG 24 hr tablet Take 1 tablet (25 mg total) by mouth daily. 10/06/19  Yes Burnell Blanks, MD  Multiple Vitamin (MULTIVITAMIN WITH MINERALS) TABS Take 1 tablet by mouth daily.   Yes [provider]  omega-3 acid ethyl esters (LOVAZA) 1 G capsule Take 1 g by mouth daily.   Yes [provider]  sacubitril-valsartan (ENTRESTO) 24-26 MG Take 1 tablet by mouth 2 (two) times daily. 09/23/19  Yes Burnell Blanks, MD  simvastatin (ZOCOR) 40 MG tablet Take 40 mg by mouth daily.   Yes [provider]  Accu-Chek FastClix Lancets MISC 1 each by Other route as directed. 08/19/19   [provider]  ACCU-CHEK GUIDE test strip 1 each by Other route See admin instructions. 09/16/19   [provider]     Allergies:    No Known Allergies   Physical Exam:   Vitals  Blood pressure 97/74, pulse 73, temperature (!) 96.6 F (35.9 C), temperature source Rectal, resp. rate (!) 25, height _0  (1.702 m), SpO2 97 %.  1.  General: Lying supine in bed in no acute distress eating a sandwich  2. Psychiatric: Appropriate affect, can answer yes/no questions appropriately, with expressive aphasia difficult to assess more  3. Neurologic: Expressive aphasia, chronic  right-sided paralysis, no new focal deficits on limited exam, at baseline  4. HEENMT:  Head is atraumatic, normocephalic, pupils are reactive to light, neck is supple, trachea is midline, mucous membranes are moist  5. Respiratory : Diminished breath sounds in the lower lung fields bilaterally, no crackles, no wheezes or rhonchi,   6. Cardiovascular : Heart rate is bradycardic, rhythm is irregularly irregular, no murmur,2+ pitting edema in the legs up to thigh and lower abdomen  7. Gastrointestinal:  Abdomen is distended, nontender, no masses palpated, taut but not rigid  8. Skin:  Venous stasis changes in the lower extremities, no acute lesions on limited exam  9.Musculoskeletal:  Peripheral edema as described above, no acute deformities    Data Review:    CBC Recent Labs  Lab 11/04/19 1818  WBC 5.6  HGB 13.4  HCT 38.7*  PLT 216  MCV 93.0  MCH 32.2  MCHC 34.6  RDW 15.8*  LYMPHSABS 0.7  MONOABS 0.5  EOSABS 0.2  BASOSABS 0.0   ------------------------------------------------------------------------------------------------------------------  Results for orders placed or performed during the hospital encounter of 11/04/19 (from the past 48 hour(s))  Comprehensive metabolic panel     Status: Abnormal   Collection Time: 11/04/19  6:18 PM  Result Value Ref Range   Sodium 124 (L) 135 - 145 mmol/L   Potassium 5.6 (H) 3.5 - 5.1 mmol/L   Chloride 92 (L) 98 - 111 mmol/L   CO2 21 (L) 22 - 32 mmol/L   Glucose, Bld 63 (L) 70 - 99 mg/dL    Comment: Glucose reference range applies only to samples taken after fasting for at least 8 hours.   BUN 24 (H) 8 - 23 mg/dL   Creatinine, Ser 1.22 0.61 - 1.24 mg/dL  Calcium 9.6 8.9 - 10.3 mg/dL   Total Protein 8.4 (H) 6.5 - 8.1 g/dL   Albumin 4.3 3.5 - 5.0 g/dL   AST 35 15 - 41 U/L   ALT 17 0 - 44 U/L   Alkaline Phosphatase 206 (H) 38 - 126 U/L   Total Bilirubin 1.8 (H) 0.3 - 1.2 mg/dL   GFR, Estimated >60 >60 mL/min    Comment:  (NOTE) Calculated using the CKD-EPI Creatinine Equation (2021)    Anion gap 11 5 - 15    Comment: Performed at [facility], [place] [address], [place], [place] [address]  CBC with Differential     Status: Abnormal   Collection Time: 11/04/19  6:18 PM  Result Value Ref Range   WBC 5.6 4.0 - 10.5 K/uL   RBC 4.16 (L) 4.22 - 5.81 MIL/uL   Hemoglobin 13.4 13.0 - 17.0 g/dL   HCT 38.7 (L) 39 - 52 %   MCV 93.0 80.0 - 100.0 fL   MCH 32.2 26.0 - 34.0 pg   MCHC 34.6 30.0 - 36.0 g/dL   RDW 15.8 (H) 11.5 - 15.5 %   Platelets 216 150 - 400 K/uL   nRBC 0.0 0.0 - 0.2 %   Neutrophils Relative % 75 %   Neutro Abs 4.2 1.7 - 7.7 K/uL   Lymphocytes Relative 13 %   Lymphs Abs 0.7 0.7 - 4.0 K/uL   Monocytes Relative 8 %   Monocytes Absolute 0.5 0.1 - 1.0 K/uL   Eosinophils Relative 3 %   Eosinophils Absolute 0.2 0.0 - 0.5 K/uL   Basophils Relative 1 %   Basophils Absolute 0.0 0.0 - 0.1 K/uL   Immature Granulocytes 0 %   Abs Immature Granulocytes 0.02 0.00 - 0.07 K/uL    Comment: Performed at [facility], [place] [address] [place], [place] [address]  Troponin I (High Sensitivity)     Status: Abnormal   Collection Time: 11/04/19  6:18 PM  Result Value Ref Range   Troponin I (High Sensitivity) 26 (H) <18 ng/L    Comment: (NOTE) Elevated high sensitivity troponin I (hsTnI) values and significant  changes across serial measurements may suggest ACS but many other  chronic and acute conditions are known to elevate hsTnI results.  Refer to the "Links" section for chest pain algorithms and additional  guidance. Performed at [facility], [place] [address], [place], [place] [address]   Brain natriuretic peptide     Status: Abnormal   Collection Time: 11/04/19  6:18 PM  Result Value Ref Range   B Natriuretic Peptide 1,651.0 (H) 0.0 - 100.0 pg/mL    Comment: Performed at [facility], [place] [address] [place], [place] [address]   Respiratory Panel by RT PCR (Flu A&B, Covid) - Nasopharyngeal Swab     Status: None   Collection Time: 11/04/19  6:21 PM   Specimen: Nasopharyngeal Swab  Result Value Ref Range   SARS Coronavirus 2 by RT PCR NEGATIVE NEGATIVE    Comment: (NOTE) SARS-CoV-2 target nucleic acids are NOT DETECTED.  The SARS-CoV-2 RNA is generally detectable in upper respiratoy specimens during the acute phase of infection. The lowest concentration of SARS-CoV-2 viral copies this assay can detect is 131 copies/mL. A negative result does not preclude SARS-Cov-2 infection and should not be used as the sole basis for treatment or other patient management decisions. A negative result may occur with  improper specimen collection/handling, submission of specimen other than nasopharyngeal  swab, presence of viral mutation(s) within the areas targeted by this assay, and inadequate number of viral copies (<131 copies/mL). A negative result must be combined with clinical observations, patient history, and epidemiological information. The expected result is Negative.  Fact Sheet for Patients:  PinkCheek.be  Fact Sheet for Healthcare Providers:  GravelBags.it  This test is no t yet approved or cleared by the [place] FDA and  has been authorized for detection and/or diagnosis of SARS-CoV-2 by FDA under an Emergency Use Authorization (EUA). This EUA will remain  in effect (meaning this test can be used) for the duration of the COVID-19 declaration under Section 564(b)(1) of the Act, 21 U.S.C. section 360bbb-3(b)(1), unless the authorization is terminated or revoked sooner.     Influenza A by PCR NEGATIVE NEGATIVE   Influenza B by PCR NEGATIVE NEGATIVE    Comment: (NOTE) The Xpert Xpress SARS-CoV-2/FLU/RSV assay is intended as an aid in  the diagnosis of influenza from Nasopharyngeal swab specimens and  should not be used as a sole basis for  treatment. Nasal washings and  aspirates are unacceptable for Xpert Xpress SARS-CoV-2/FLU/RSV  testing.  Fact Sheet for Patients: PinkCheek.be  Fact Sheet for Healthcare Providers: GravelBags.it  This test is not yet approved or cleared by the [place] FDA and  has been authorized for detection and/or diagnosis of SARS-CoV-2 by  FDA under an Emergency Use Authorization (EUA). This EUA will remain  in effect (meaning this test can be used) for the duration of the  Covid-19 declaration under Section 564(b)(1) of the Act, 21  U.S.C. section 360bbb-3(b)(1), unless the authorization is  terminated or revoked. Performed at [facility], [place] [address] [place], [place] [address]   Group A Strep by PCR     Status: None   Collection Time: 11/04/19  6:45 PM   Specimen: Throat; Sterile Swab  Result Value Ref Range   Group A Strep by PCR NOT DETECTED NOT DETECTED    Comment: Performed at [facility], [place] [address], [place], [place] [address]  POC CBG, ED     Status: Abnormal   Collection Time: 11/04/19 10:05 PM  Result Value Ref Range   Glucose-Capillary 20 (LL) 70 - 99 mg/dL    Comment: Glucose reference range applies only to samples taken after fasting for at least 8 hours.   Comment 1 Notify RN     Chemistries  Recent Labs  Lab 11/04/19 1818  NA 124*  K 5.6*  CL 92*  CO2 21*  GLUCOSE 63*  BUN 24*  CREATININE 1.22  CALCIUM 9.6  AST 35  ALT 17  ALKPHOS 206*  BILITOT 1.8*   ------------------------------------------------------------------------------------------------------------------  ------------------------------------------------------------------------------------------------------------------ GFR: CrCl cannot be calculated (Unknown ideal weight.). Liver Function Tests: Recent Labs  Lab 11/04/19 1818  AST 35  ALT 17  ALKPHOS 206*  BILITOT 1.8*  PROT 8.4*   ALBUMIN 4.3   No results for input(s): LIPASE, AMYLASE in the last 168 hours. No results for input(s): AMMONIA in the last 168 hours. Coagulation Profile: No results for input(s): INR, PROTIME in the last 168 hours. Cardiac Enzymes: No results for input(s): CKTOTAL, CKMB, CKMBINDEX, TROPONINI in the last 168 hours. BNP (last 3 results) No results for input(s): PROBNP in the last 8760 hours. HbA1C: No results for input(s): HGBA1C in the last 72 hours. CBG: Recent Labs  Lab 11/04/19 2205  GLUCAP 20*   Lipid Profile: No results for input(s): CHOL, HDL, LDLCALC, TRIG, CHOLHDL, LDLDIRECT  in the last 72 hours. Thyroid Function Tests: No results for input(s): TSH, T4TOTAL, FREET4, T3FREE, THYROIDAB in the last 72 hours. Anemia Panel: No results for input(s): VITAMINB12, FOLATE, FERRITIN, TIBC, IRON, RETICCTPCT in the last 72 hours.  --------------------------------------------------------------------------------------------------------------- Urine analysis:    Component Value Date/Time   LABSPEC <=1.005 11/17/2008 1539   PHURINE 6.0 11/17/2008 1539   GLUCOSEU NEGATIVE 11/17/2008 1539   HGBUR NEGATIVE 11/17/2008 1539   BILIRUBINUR NEGATIVE 11/17/2008 1539   KETONESUR NEGATIVE 11/17/2008 1539   PROTEINUR NEGATIVE 11/17/2008 1539   UROBILINOGEN 1.0 11/17/2008 1539   NITRITE NEGATIVE 11/17/2008 1539   LEUKOCYTESUR  11/17/2008 1539    NEGATIVE Biochemical Testing Only. Please order routine urinalysis from main lab if confirmatory testing is needed.      Imaging Results:    No results found.  My personal review of EKG: Rhythm NSR, Rate 47 /min, QTc 337 ,no Acute ST changes   Assessment & Plan:    Active Problems:   CHF (congestive heart failure) ([place])   1. Acute CHF exacerbation 1. Last EF was in April 2021 and showed ejection fraction of 20 to 25% -update echo 2. Patient does not have ICD and family reports that they do not know why he does not have one 3. BNP is  greater than 1600 on this admission, it has been greater than 1900 in the past, his baseline seems to be around 600 4. Chest x-ray shows cardiomegaly with possible pleural effusions 5. Attempt to manage outpatient by doubling up on Lasix failed 6. Daily weights, monitor intake and output, fluid restriction of 1500 mL, diuretics 7. Holding beta-blocker as patient's blood pressure has been soft and heart rate has been bradycardic 8. Troponin is at patient's baseline of 26 9. Continue to monitor 2. Hypervolemic hyponatremia 1. Hyponatremia of 124 in the setting of fluid overload 2. Likely to improve with Lasix 3. Monitor in the a.m. 4. Today sodium is 124 3. Hypothermia 1. Temperature on arrival was 95.3 2. Warming blanket applied 3. Repeat temp 96.6 4. Continue warming blanket 5. Check TSH 6. Continue to monitor 4. Hypotension/Bradycardia 1. HR 50, BP 90/60 2. Recently started on metoprolol for afib/aflutter 3. Hold metoprolol 4. Monitor on tele 5. Dysphagia 1. Patient has been complaining of difficulty swallowing 2. Speech eval and treat 6. Hyperkalemia 1. Minimally elevated at 5.6 2. Likely to improve with IV Lasix 3. Monitor in the a.m. 4. If trending up consider hyperkalemic protocol 7. Elevated creatinine 1. Cr baseline 1.0 2. Today is 1.22 - does not meet AKI criteria 3. Likely cardio-renal syndrome and will improve with lasix 4. Trend in the AM 8. Elevated alk phos/T bili 1. Likely 2/2 to congestion 2. Continue treatments above 3. Trend in the AM 9. Elevated troponin 1. Patient's Cr has been in 20s on previous visits 2. Today 26, repeat pending 3. No chest pain 4. EKG with minimal ST depression that was present in Jul 2021 EKG 5. Monitor on tele 6. Continue entresto, aspirin, and statin 10. Hypoglycemia in the setting of diabetes mellitus type 2 1. Initial glucose 60s, on admission - repeat glucose 20 2. Patient eating and amp D50 given 3. Hold glipizide and  Metformin 4. Hypoglycemic protocol ordered 5. Consider discontinuing glipizide at discharge, and starting farxiga 6. Continue to monitor 11. Afib 1. Continue eliquis 2. Hold metoprolol 3. Consult cardiology   DVT Prophylaxis-   Eliquis - SCDs   AM Labs Ordered, also please review Full Orders  Family  Communication: Admission, patients condition and plan of care including tests being ordered have been discussed with the patient and Niece, Norvel Richards who indicate understanding and agree with the plan and Code Status.  Code Status:  Full  Admission status: Observation  Time spent in minutes : [place]

## 2019-11-04 NOTE — ED Notes (Signed)
Pt. Documented in error see above note in chart. 

## 2019-11-04 NOTE — ED Triage Notes (Addendum)
Patient c/o sore throat, body aches x 4 days and SOB x 2 weeks. patient has a history of stroke and paralysis of the right side.  Patient also has abdominal distention.

## 2019-11-04 NOTE — ED Notes (Addendum)
Pt a/o x 4 and eating a sandwich. Dr. Carren Rang made aware of pt's CBG.  Verbal order given to administer D50 IV.

## 2019-11-05 ENCOUNTER — Observation Stay (HOSPITAL_COMMUNITY): Payer: Medicaid Other

## 2019-11-05 DIAGNOSIS — Z66 Do not resuscitate: Secondary | ICD-10-CM | POA: Diagnosis not present

## 2019-11-05 DIAGNOSIS — I2510 Atherosclerotic heart disease of native coronary artery without angina pectoris: Secondary | ICD-10-CM | POA: Diagnosis present

## 2019-11-05 DIAGNOSIS — I361 Nonrheumatic tricuspid (valve) insufficiency: Secondary | ICD-10-CM | POA: Diagnosis not present

## 2019-11-05 DIAGNOSIS — I340 Nonrheumatic mitral (valve) insufficiency: Secondary | ICD-10-CM | POA: Diagnosis not present

## 2019-11-05 DIAGNOSIS — I4892 Unspecified atrial flutter: Secondary | ICD-10-CM | POA: Diagnosis not present

## 2019-11-05 DIAGNOSIS — E870 Hyperosmolality and hypernatremia: Secondary | ICD-10-CM | POA: Diagnosis not present

## 2019-11-05 DIAGNOSIS — Z515 Encounter for palliative care: Secondary | ICD-10-CM | POA: Diagnosis not present

## 2019-11-05 DIAGNOSIS — I4819 Other persistent atrial fibrillation: Secondary | ICD-10-CM | POA: Diagnosis present

## 2019-11-05 DIAGNOSIS — R570 Cardiogenic shock: Secondary | ICD-10-CM

## 2019-11-05 DIAGNOSIS — I69351 Hemiplegia and hemiparesis following cerebral infarction affecting right dominant side: Secondary | ICD-10-CM | POA: Diagnosis not present

## 2019-11-05 DIAGNOSIS — E11649 Type 2 diabetes mellitus with hypoglycemia without coma: Secondary | ICD-10-CM | POA: Diagnosis present

## 2019-11-05 DIAGNOSIS — I5021 Acute systolic (congestive) heart failure: Secondary | ICD-10-CM

## 2019-11-05 DIAGNOSIS — E871 Hypo-osmolality and hyponatremia: Secondary | ICD-10-CM

## 2019-11-05 DIAGNOSIS — E854 Organ-limited amyloidosis: Secondary | ICD-10-CM | POA: Diagnosis not present

## 2019-11-05 DIAGNOSIS — R001 Bradycardia, unspecified: Secondary | ICD-10-CM | POA: Diagnosis present

## 2019-11-05 DIAGNOSIS — Z8249 Family history of ischemic heart disease and other diseases of the circulatory system: Secondary | ICD-10-CM | POA: Diagnosis not present

## 2019-11-05 DIAGNOSIS — E785 Hyperlipidemia, unspecified: Secondary | ICD-10-CM | POA: Diagnosis present

## 2019-11-05 DIAGNOSIS — I509 Heart failure, unspecified: Secondary | ICD-10-CM | POA: Diagnosis present

## 2019-11-05 DIAGNOSIS — Z7982 Long term (current) use of aspirin: Secondary | ICD-10-CM | POA: Diagnosis not present

## 2019-11-05 DIAGNOSIS — Z8673 Personal history of transient ischemic attack (TIA), and cerebral infarction without residual deficits: Secondary | ICD-10-CM | POA: Diagnosis not present

## 2019-11-05 DIAGNOSIS — R188 Other ascites: Secondary | ICD-10-CM | POA: Diagnosis not present

## 2019-11-05 DIAGNOSIS — I4891 Unspecified atrial fibrillation: Secondary | ICD-10-CM | POA: Diagnosis not present

## 2019-11-05 DIAGNOSIS — I428 Other cardiomyopathies: Secondary | ICD-10-CM | POA: Diagnosis present

## 2019-11-05 DIAGNOSIS — I5023 Acute on chronic systolic (congestive) heart failure: Secondary | ICD-10-CM | POA: Diagnosis not present

## 2019-11-05 DIAGNOSIS — T68XXXA Hypothermia, initial encounter: Secondary | ICD-10-CM

## 2019-11-05 DIAGNOSIS — Z833 Family history of diabetes mellitus: Secondary | ICD-10-CM | POA: Diagnosis not present

## 2019-11-05 DIAGNOSIS — R609 Edema, unspecified: Secondary | ICD-10-CM | POA: Diagnosis not present

## 2019-11-05 DIAGNOSIS — I472 Ventricular tachycardia: Secondary | ICD-10-CM | POA: Diagnosis not present

## 2019-11-05 DIAGNOSIS — Z20822 Contact with and (suspected) exposure to covid-19: Secondary | ICD-10-CM | POA: Diagnosis not present

## 2019-11-05 DIAGNOSIS — I69320 Aphasia following cerebral infarction: Secondary | ICD-10-CM | POA: Diagnosis not present

## 2019-11-05 DIAGNOSIS — Z7984 Long term (current) use of oral hypoglycemic drugs: Secondary | ICD-10-CM | POA: Diagnosis not present

## 2019-11-05 DIAGNOSIS — Z7189 Other specified counseling: Secondary | ICD-10-CM | POA: Diagnosis not present

## 2019-11-05 DIAGNOSIS — I110 Hypertensive heart disease with heart failure: Secondary | ICD-10-CM | POA: Diagnosis present

## 2019-11-05 LAB — BASIC METABOLIC PANEL
Anion gap: 16 — ABNORMAL HIGH (ref 5–15)
BUN: 25 mg/dL — ABNORMAL HIGH (ref 8–23)
CO2: 19 mmol/L — ABNORMAL LOW (ref 22–32)
Calcium: 9.5 mg/dL (ref 8.9–10.3)
Chloride: 91 mmol/L — ABNORMAL LOW (ref 98–111)
Creatinine, Ser: 1.23 mg/dL (ref 0.61–1.24)
GFR, Estimated: >60 mL/min (ref >60)
Glucose, Bld: 61 mg/dL — ABNORMAL LOW (ref 70–99)
Potassium: 5 mmol/L (ref 3.5–5.1)
Sodium: 126 mmol/L — ABNORMAL LOW (ref 135–145)

## 2019-11-05 LAB — CBC WITH DIFFERENTIAL/PLATELET
Abs Immature Granulocytes: 0.02 10*3/uL (ref 0.00–0.07)
Basophils Absolute: 0 10*3/uL (ref 0.0–0.1)
Basophils Relative: 1 %
Eosinophils Absolute: 0.1 10*3/uL (ref 0.0–0.5)
Eosinophils Relative: 2 %
HCT: 35.6 % — ABNORMAL LOW (ref 39.0–52.0)
Hemoglobin: 12.3 g/dL — ABNORMAL LOW (ref 13.0–17.0)
Immature Granulocytes: 0 %
Lymphocytes Relative: 10 %
Lymphs Abs: 0.6 10*3/uL — ABNORMAL LOW (ref 0.7–4.0)
MCH: 32.1 pg (ref 26.0–34.0)
MCHC: 34.6 g/dL (ref 30.0–36.0)
MCV: 93 fL (ref 80.0–100.0)
Monocytes Absolute: 0.5 10*3/uL (ref 0.1–1.0)
Monocytes Relative: 8 %
Neutro Abs: 4.5 10*3/uL (ref 1.7–7.7)
Neutrophils Relative %: 79 %
Platelets: 211 10*3/uL (ref 150–400)
RBC: 3.83 MIL/uL — ABNORMAL LOW (ref 4.22–5.81)
RDW: 15.9 % — ABNORMAL HIGH (ref 11.5–15.5)
WBC: 5.7 10*3/uL (ref 4.0–10.5)
nRBC: 0 % (ref 0.0–0.2)

## 2019-11-05 LAB — MAGNESIUM: Magnesium: 2 mg/dL (ref 1.7–2.4)

## 2019-11-05 LAB — GLUCOSE, CAPILLARY
Glucose-Capillary: 53 mg/dL — ABNORMAL LOW (ref 70–99)
Glucose-Capillary: 131 mg/dL — ABNORMAL HIGH (ref 70–99)
Glucose-Capillary: 116 mg/dL — ABNORMAL HIGH (ref 70–99)
Glucose-Capillary: 95 mg/dL (ref 70–99)
Glucose-Capillary: 110 mg/dL — ABNORMAL HIGH (ref 70–99)

## 2019-11-05 LAB — ECHOCARDIOGRAM COMPLETE
Area-P 1/2: 3.42 cm2
Height: 67 in
S' Lateral: 4 cm
Weight: 2472.68 oz

## 2019-11-05 MED ORDER — SODIUM CHLORIDE 0.9 % IV BOLUS
500.0000 mL | Freq: Once | INTRAVENOUS | Status: AC
  Administered 2019-11-05: 500 mL via INTRAVENOUS

## 2019-11-05 MED ORDER — DEXTROSE 50 % IV SOLN
INTRAVENOUS | Status: AC
  Administered 2019-11-05: 25 mL via INTRAVENOUS
  Filled 2019-11-05: qty 50

## 2019-11-05 MED ORDER — CHLORHEXIDINE GLUCONATE CLOTH 2 % EX PADS
6.0000 | MEDICATED_PAD | Freq: Every day | CUTANEOUS | Status: DC
  Administered 2019-11-05 – 2019-11-16 (×11): 6 via TOPICAL

## 2019-11-05 MED ORDER — LIDOCAINE HCL URETHRAL/MUCOSAL 2 % EX GEL
1.0000 "application " | Freq: Once | CUTANEOUS | Status: DC
  Filled 2019-11-05: qty 11

## 2019-11-05 MED ORDER — ORAL CARE MOUTH RINSE
15.0000 mL | Freq: Two times a day (BID) | OROMUCOSAL | Status: DC
  Administered 2019-11-05 – 2019-11-14 (×18): 15 mL via OROMUCOSAL

## 2019-11-05 MED ORDER — LIDOCAINE 5 % EX OINT: TOPICAL_OINTMENT | Freq: Once | CUTANEOUS | Status: DC

## 2019-11-05 MED ORDER — LIDOCAINE HCL URETHRAL/MUCOSAL 2 % EX GEL
1.0000 "application " | Freq: Once | CUTANEOUS | Status: AC
  Administered 2019-11-05: 1 via URETHRAL
  Filled 2019-11-05: qty 11

## 2019-11-05 MED ORDER — CHLORHEXIDINE GLUCONATE 0.12 % MT SOLN
15.0000 mL | Freq: Two times a day (BID) | OROMUCOSAL | Status: DC
  Administered 2019-11-05 – 2019-11-16 (×23): 15 mL via OROMUCOSAL
  Filled 2019-11-05 (×21): qty 15

## 2019-11-05 NOTE — Assessment & Plan Note (Addendum)
-   echo in May 2021 shows EF 20-25%. Repeat echo now shows EF 15-20% - albumin surprisingly not low; 4.3 - BNP at baseline will be elevated with Entresto use however does appear overtly higher on admission, 1651; his edema is most in his abdomen (also confirmed by niece as they say abdomen has swollen tremendously recently); this is poor prognosticator with worsening CHF leading to cardiac ascites  - patient initially started on IV lasix however on 10/29 became bradycardic and hypotensive (he did have a BB at home PTA) - s/p 500 cc NS bolus during rapid response 10/29; BP stable for now - hold off on further lasix; may possibly need repeat small bolus PRN for now - see cardiogenic shock

## 2019-11-05 NOTE — Assessment & Plan Note (Addendum)
-   greatly appreciate cardiology evaluation; I agree with cardiogenic shock and guarded prognosis - he has developed cardiac ascites rapidly over the past couple weeks per niece, hyponatremia, hypotension, hypothermia, and some decrease in UOP yesterday, but seems to have picked up some for now - I've had some GOC discussions with niece Engineer, mining) over the past 2 days - given rapid decline, cardiology has discussed case and patient is recommended for transfer to [facility] for evaluation by the AHF team; patient follows with Dr. [name] outpatient

## 2019-11-05 NOTE — Assessment & Plan Note (Signed)
-   residual severe expressive aphasia (very hard to know what patient is wanting and mostly points to things), also has right side paralysis

## 2019-11-05 NOTE — Progress Notes (Signed)
  Echocardiogram 2D Echocardiogram has been performed.  [name] 11/05/2019, 9:52 AM

## 2019-11-05 NOTE — Assessment & Plan Note (Addendum)
-   worsening Na since July - patient cannot tolerate further diuresis at this point; volume is in abdomen not peripherally and albumin normal - monitor for now - Na improved some after 500 cc NS bolus on 10/29; no significant SOB or change in respi status

## 2019-11-05 NOTE — Progress Notes (Signed)
   11/05/19 1230  Assess: MEWS Score  BP (!) 72/48  Assess: MEWS Score  MEWS Temp 0  MEWS Systolic 2  MEWS Pulse 0  MEWS RR 0  MEWS LOC 0  MEWS Score 2  MEWS Score Color Yellow  Assess: if the MEWS score is Yellow or Red  Were vital signs taken at a resting state? Yes  Focused Assessment No change from prior assessment  Early Detection of Sepsis Score *See Row Information* Medium  MEWS guidelines implemented *See Row Information* Yes  Treat  MEWS Interventions Administered prn meds/treatments;Escalated (See documentation below)  Pain Scale PAINAD  Breathing 0  Negative Vocalization 2  Facial Expression 1  Body Language 1  Consolability 1  PAINAD Score 5  Take Vital Signs  Increase Vital Sign Frequency  Yellow: Q 2hr X 2 then Q 4hr X 2, if remains yellow, continue Q 4hrs  Escalate  MEWS: Escalate Yellow: discuss with charge nurse/RN and consider discussing with provider and RRT  Notify: Charge Nurse/RN  Name of Charge Nurse/RN Notified Launa Flight, RN  Date Charge Nurse/RN Notified 11/05/19  Time Charge Nurse/RN Notified 1230  Notify: Provider  Provider Name/Title Dr. Frederick Peers  Date Provider Notified 11/05/19  Time Provider Notified 1240  Notification Type Page  Notification Reason Change in status  Response See new orders  Date of Provider Response 11/05/19  Time of Provider Response 1242  Notify: Rapid Response  Name of Rapid Response RN Notified Fredric Mare, RN  Date Rapid Response Notified 11/05/19  Time Rapid Response Notified 1230

## 2019-11-05 NOTE — Assessment & Plan Note (Signed)
-   patient not likely to benefit from any further statin use for 10-year mortality

## 2019-11-05 NOTE — Progress Notes (Signed)
Inpatient Diabetes Program Recommendations  AACE/ADA: New Consensus Statement on Inpatient Glycemic Control (2015)  Target Ranges:  Prepandial:   less than 140 mg/dL      Peak postprandial:   less than 180 mg/dL (1-2 hours)      Critically ill patients:  140 - 180 mg/dL   Lab Results  Component Value Date   GLUCAP 131 (H) 11/05/2019   HGBA1C 6.0 (H) 07/16/2019    Review of Glycemic Control Results for [name], [name] (MRN [id number]) as of 11/05/2019 10:52  Ref. Range 11/04/2019 22:05 11/04/2019 23:06 11/05/2019 08:07 11/05/2019 08:39  Glucose-Capillary Latest Ref Range: 70 - 99 mg/dL 20 (LL) 299 (H) 53 (L) 131 (H)   Diabetes history: DM2 Outpatient Diabetes medications: Glipizide 10 mg qd + Metformin 1 gm bid Current orders for Inpatient glycemic control: Novolog 0-9 units tid  Inpatient Diabetes Program Recommendations:   Patient may need D/C or adjustment of Glipizide and Metformin on discharge. Will follow during hospitalization.  Thank you, Billy Fischer. Bucky Grigg, RN, MSN, CDE  Diabetes Coordinator Inpatient Glycemic Control Team Team Pager [phone] (8am-5pm) 11/05/2019 10:54 AM

## 2019-11-05 NOTE — Hospital Course (Addendum)
[name] is a 64 y.o. 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 Fib/flutter who presented to the ED with a chief complaint of dyspnea.   He has become progressively more dyspneic at home especially with any movement.  He had been ambulating with a cane and started developing worsening distention/swelling in his abdomen. His Lasix dosing was increased outpatient per his cardiologist with minimal improvement. Due to his progressively worsening dyspnea and abdominal swelling, he was brought to the ER for further evaluation.  On work-up he was found to be hypothermic, hypoglycemic, hyponatremic, bradycardic, hypotensive, slightly tachypneic. He had elevated BNP, indeterminate troponins.  He was admitted for further work-up and treatment.  He was initially started on Lasix for diuresis notably of his abdominal ascites.  He became more hypotensive and bradycardic requiring discontinuation of Lasix.  He was given a 500 cc normal saline bolus on 11/05/2019 which stabilized blood pressure.  Cardiology was consulted for further evaluation given concern for development of cardiogenic shock.  Vitals remained stable but borderline.  He was recommended for transfer to [facility] for evaluation by the advanced heart failure team.  He was not started on inotropic therapy prior to transfer. Medications held include Entresto, metoprolol, Lasix.

## 2019-11-05 NOTE — Progress Notes (Addendum)
Rapid Response Event Note   Reason for Call :  Distended abdomen, Not emptying bladder fully, and EKG strip confirmation for HR  Initial Focused Assessment:  Belly extremely distended and firm. No c/o related pain. Only 80mL seen in urinal. Bladder scan showed 500+. Primary RN attempted I&Ox2 w/o success  Interventions:  Suggested coude and lidocaine administration prior to foley insertion attempt.   Plan of Care:  Floor RN to follow up with [facility] night coverage  0330 Primary RN called back concerned about distention and foley not functioning, referred back to [facility] night coverage suggestions for KUB.  KUB Results: Slightly prominent air-filled loops of small and large bowel noted. Mild adynamic ileus may be present. Follow-up exam suggested demonstrate resolution and to exclude developing bowel obstruction.   MD Notified: M. Katherina Right ([facility] coverage) Call Time: 0001 Arrival Time: 0128 End Time:  Elliot Cousin, RN

## 2019-11-05 NOTE — Assessment & Plan Note (Signed)
-   currently bradycardic and hypotensive - hold BB - continue Eliquis for now

## 2019-11-05 NOTE — Progress Notes (Signed)
Hypoglycemic Event  CBG: 53  Treatment: D50 25 mL (12.5 gm)  Symptoms: None  Follow-up CBG: Time:0840 CBG Result:131  Possible Reasons for Event: Inadequate meal intake  Comments/MD notified:    [name]

## 2019-11-05 NOTE — Progress Notes (Signed)
Patient states he is hurting where the coude catheter is placed. Catheter is only draining a small amount of urine. Bladder scan show 911 ml.  Reassessed catheter/ repositioned. Still remained painful. Minimal urine flow.Made Marikay Alar, NP aware . On Call Katherina Right came to Bedside per req to assess patient with Nurse. No relief per patient. Verbal orders to remove Catheter. Plans to consult urology later in am. Will cont to monitor.

## 2019-11-05 NOTE — Significant Event (Signed)
Rapid Response Event Note   Reason for Call :  Called to bedside for red MEWS and hypotension  Initial Focused Assessment:  Neuro: Post CVA with right sided deficits (this is baseline). Able to communicate basic needs. Per bedside RN this has been his baseline for his hospitalization. No acute change Resp: Room air. 100%. Breath sounds clear and diminished Cards: BP systolic in the 80s (see flowsheets). HR a-flutter in the 40-60s GU: Foley in place. Little urine output     Interventions:  MD at bedside (Girguis). 500cc bolus given for hypotension.  Plan of Care:  MD okay to leave on progressive as long as MAP >65. Cardiology agrees with leaving on progressive. Pt responded well to fluid. BP now in the 90s systolic.   Event Summary:   MD Notified: Girguis Call Time: 1309 Arrival Time: 1311 End Time: 1350  Alveria Apley, RN

## 2019-11-05 NOTE — Assessment & Plan Note (Signed)
-   see cardiogenic shock

## 2019-11-05 NOTE — Consult Note (Signed)
Cardiology Consultation:   Patient ID: [name] MRN: [id number]; DOB: 12/31/1955  Admit date: 11/04/2019 Date of Consult: 11/05/2019  Primary Care Provider: Mirna Mires, MD [facility] Cardiologist: Christopher Carrow, MD  [name] Electrophysiologist:  None    Patient Profile:   [name] is a 64 y.o. male who is being seen today for the evaluation of heart failure at the request of [name].  History of Present Illness:   [name] is a 64 year old male with prior stroke in 2010 with expressive aphasia chronic right-sided weakness diabetes hypertension nonischemic cardiomyopathy with chronic systolic heart failure as well as atrial flutter who was seen by Dr. Clifton [name] last in the office setting on 10/06/2019.  Prior admission on 09/04/2016 was with atrial flutter and was found to have an EF of 20%.  His cardiac catheterization was performed on 09/06/2016 that showed mild nonobstructive CAD.  He was placed on anticoagulation with Eliquis and started on Entresto.  Shortly after cardioversion in October 2018 he was back in atrial flutter with heart rates in the 50s.  In July 2019 he was diuresed at the [name] with IV Lasix for heart failure exacerbation.  In April 2021 he also had mild volume overload and Lasix was increased.  Last echocardiogram in April 2021 showed EF of 2025% which is unchanged.  Back in July 2021 he was admitted to [facility] with volume overload.  He was diuresed with IV Lasix but carvedilol was stopped secondary to hypotension.   He was on low-dose Entresto.  His niece [name] provided history on admission and she reported that 2 weeks ago he started feeling winded with minimal movement.  Ambulates with a cane.  He felt short of breath they could see him working harder to breathe.  Dr. Clifton [name] recommended increasing Lasix from 20 mg a day up to 40 mg daily for 3 days.  He had some mild improvement in symptoms but then fluid retention  continued.  She noted in his thighs and lower legs.  Dr. Clifton [name] then recommended again 40 mg of Lasix in the morning and 20 in the evening.  But overall he began to feel worse and they came to the emergency room.  Fairly normal appetite.  May have had some trouble swallowing.  May have been drinking more fluids than normal.  On arrival his temperature was 95 heart rate 50 blood pressure 96/61 sodium 124 creatinine 1.2 glucose was 20 - Covid.  BNP 1651.  Chest x-ray showed possible pleural effusions.  Since he has been here, his blood pressures have been soft in the 90s down to the upper 70s.  [name] was held this morning.  Lasix a total of 60 mg was administered since arrival.  Rapid response nurse administered 500 mL of normal saline.  Current blood pressure is 97 systolic.  Stabilized.  He appears comfortable, no shortness of breath.  No excessive fluid was noted on exam.    Past Medical History:  Diagnosis Date  . Atrial flutter (HCC)   . CHF (congestive heart failure) (HCC)   . Diabetes mellitus   . Hypertension   . 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 Stroke [name])     Past Surgical History:  Procedure Laterality Date  . CARDIOVERSION N/A 10/10/2016   Procedure: CARDIOVERSION;  Surgeon: Christopher Bunting, MD;  Location: [name] ENDOSCOPY;  Service: Cardiovascular;  Laterality: N/A;  . RIGHT/LEFT HEART  CATH AND CORONARY ANGIOGRAPHY N/A 09/06/2016   Procedure: RIGHT/LEFT HEART CATH AND CORONARY ANGIOGRAPHY;  Surgeon: Christopher Bihari, MD;  Location: [facility] INVASIVE CV LAB;  Service: Cardiovascular;  Laterality: N/A;  . TRACHEOSTOMY       Home Medications:  Prior to Admission medications   Medication Sig Start Date End Date Taking? Authorizing Provider  acetaminophen (TYLENOL) 500 MG tablet Take 1,000 mg by mouth every 6 (six) hours as needed for pain.   Yes [provider]  apixaban  (ELIQUIS) 5 MG TABS tablet Take 1 tablet (5 mg total) by mouth 2 (two) times daily. 03/15/19  Yes Kathleene Hazel, MD  aspirin EC 81 MG EC tablet Take 1 tablet (81 mg total) by mouth daily. 09/09/16  Yes Leone Brand, NP  furosemide (LASIX) 40 MG tablet Take 2 tablets (80 mg) every AM and 1 tablet (40 mg) every PM Patient taking differently: Take 40-80 mg by mouth See admin instructions. Take 2 tablets (80 mg) every AM and 1 tablet (40 mg) every PM 11/03/19  Yes Kathleene Hazel, MD  glipiZIDE (GLUCOTROL) 10 MG tablet Take 1 tablet (10 mg total) by mouth daily with breakfast. 09/09/16  Yes Leone Brand, NP  loratadine (CLARITIN) 10 MG tablet Take 10 mg by mouth daily as needed for allergies.  06/29/18  Yes [provider]  metFORMIN (GLUCOPHAGE) 1000 MG tablet Take 1,000 mg by mouth 2 (two) times daily.   Yes [provider]  metoprolol succinate (TOPROL XL) 25 MG 24 hr tablet Take 1 tablet (25 mg total) by mouth daily. 10/06/19  Yes Kathleene Hazel, MD  Multiple Vitamin (MULTIVITAMIN WITH MINERALS) TABS Take 1 tablet by mouth daily.   Yes [provider]  omega-3 acid ethyl esters (LOVAZA) 1 G capsule Take 1 g by mouth daily.   Yes [provider]  sacubitril-valsartan (ENTRESTO) 24-26 MG Take 1 tablet by mouth 2 (two) times daily. 09/23/19  Yes Kathleene Hazel, MD  simvastatin (ZOCOR) 40 MG tablet Take 40 mg by mouth daily.   Yes [provider]  Accu-Chek FastClix Lancets MISC 1 each by Other route as directed. 08/19/19   [provider]  ACCU-CHEK GUIDE test strip 1 each by Other route See admin instructions. 09/16/19   [provider]    Inpatient Medications: Scheduled Meds: . apixaban  5 mg Oral BID  . aspirin EC  81 mg Oral Daily  . chlorhexidine  15 mL Mouth Rinse BID  . Chlorhexidine Gluconate Cloth  6 each Topical Daily  . furosemide  40 mg Intravenous BID  . insulin aspart  0-9 Units  Subcutaneous TID WC  . lidocaine  1 application Urethral Once  . mouth rinse  15 mL Mouth Rinse q12n4p  . sacubitril-valsartan  1 tablet Oral BID  . simvastatin  40 mg Oral Daily   Continuous Infusions:  PRN Meds: acetaminophen **OR** acetaminophen, lidocaine, loratadine, ondansetron **OR** ondansetron (ZOFRAN) IV, oxyCODONE  Allergies:   No Known Allergies  Social History:   Social History   Socioeconomic History  . Marital status: Single    Spouse name: Not on file  . Number of children: Not on file  . Years of education: Not on file  . Highest education level: Not on file  Occupational History  . Not on file  Tobacco Use  . Smoking status: Never Smoker  . Smokeless tobacco: Never Used  Vaping Use  . Vaping Use: Never used  Substance and  Sexual Activity  . Alcohol use: No  . Drug use: No  . Sexual activity: Not Currently  Other Topics Concern  . Not on file  Social History Narrative  . Not on file   Social Determinants of Health   Financial Resource Strain:   . Difficulty of Paying Living Expenses: Not on file  Food Insecurity:   . Worried About Programme researcher, broadcasting/film/video in the Last Year: Not on file  . Ran Out of Food in the Last Year: Not on file  Transportation Needs:   . Lack of Transportation (Medical): Not on file  . Lack of Transportation (Non-Medical): Not on file  Physical Activity:   . Days of Exercise per Week: Not on file  . Minutes of Exercise per Session: Not on file  Stress:   . Feeling of Stress : Not on file  Social Connections:   . Frequency of Communication with Friends and Family: Not on file  . Frequency of Social Gatherings with Friends and Family: Not on file  . Attends Religious Services: Not on file  . Active Member of Clubs or Organizations: Not on file  . Attends Banker Meetings: Not on file  . Marital Status: Not on file  Intimate Partner Violence:   . Fear of Current or Ex-Partner: Not on file  . Emotionally Abused:  Not on file  . Physically Abused: Not on file  . Sexually Abused: Not on file    Family History:    Family History  Problem Relation Age of Onset  . Hypertension Mother   . Diabetes Mother   . Heart disease Father      ROS:  Please see the history of present illness.  Unable   Physical Exam/Data:   Vitals:   11/05/19 1405 11/05/19 1410 11/05/19 1415 11/05/19 1420  BP: (!) 93/59 (!) 85/62 (!) 80/70 (!) 87/63  Pulse: (!) 109 70 61 (!) 57  Resp: 18 (!) 24 19 (!) 23  Temp:      TempSrc:      SpO2: 93% 100% 100% 100%  Weight:      Height:        Intake/Output Summary (Last 24 hours) at 11/05/2019 1505 Last data filed at 11/05/2019 1254 Gross per 24 hour  Intake 120 ml  Output 450 ml  Net -330 ml   Last 3 Weights 11/05/2019 11/05/2019 10/06/2019  Weight (lbs) 154 lb 8.7 oz 153 lb 14.1 oz 145 lb 3.2 oz  Weight (kg) 70.1 kg 69.8 kg 65.862 kg     Body mass index is 24.2 kg/m.  General:  Well nourished, well developed, in no acute distress, resting comfortably, nonverbal HEENT: normal Lymph: no adenopathy Neck: no JVD Endocrine:  No thryomegaly Vascular: No carotid bruits Cardiac: Irregularly irregular, mildly bradycardic, minimal JVD Lungs:  clear to auscultation bilaterally, no wheezing, rhonchi or rales  Abd: soft, nontender, no hepatomegaly  Ext: no significant lower extremity edema Musculoskeletal:  No deformities Skin: warm and dry  Neuro: Nonverbal  psych: Seems pleasant but nonverbal  EKG:  The EKG was personally reviewed and demonstrates: Atrial fibrillation 52 bpm  Telemetry:  Telemetry was personally reviewed and demonstrates: Atrial fibrillation with heart rates in the 50s to 60s.  No significant pauses.  Relevant CV Studies:  11/05/19 ECHO:  1. Left ventricular ejection fraction, by estimation, is 15-20%. The left  ventricle has severely decreased function. The left ventricle demonstrates  global hypokinesis. Left ventricular diastolic function  could not be  evaluated. Elevated left atrial  pressure. There is the interventricular septum is flattened in diastole  ('D' shaped left ventricle), consistent with right ventricular volume  overload.  2. Right ventricular systolic function is severely reduced. The right  ventricular size is severely enlarged. There is mildly elevated pulmonary  artery systolic pressure. The estimated right ventricular systolic  pressure is 39.8 mmHg.  3. Left atrial size was severely dilated.  4. Right atrial size was severely dilated.  5. The mitral valve is grossly normal. Mild mitral valve regurgitation.  No evidence of mitral stenosis.  6. The tricuspid valve is abnormal. Tricuspid valve regurgitation is  severe.  7. The aortic valve is tricuspid. Aortic valve regurgitation is not  visualized. No aortic stenosis is present.  8. The inferior vena cava is dilated in size with <50% respiratory  variability, suggesting right atrial pressure of 15 mmHg.   Comparison(s): Changes from prior study are noted. LVEF remains ~20% with  global HK. RV severely dilated with severely reduced function. TR is now  severe.   CT of chest in July 2021 after lower extremity edema and distended abdomen showed no evidence of PE.  Laboratory Data:  High Sensitivity Troponin:   Recent Labs  Lab 11/04/19 1818 11/04/19 2142  TROPONINIHS 26* 24*     Chemistry Recent Labs  Lab 11/04/19 1818 11/05/19 0422  NA 124* 126*  K 5.6* 5.0  CL 92* 91*  CO2 21* 19*  GLUCOSE 63* 61*  BUN 24* 25*  CREATININE 1.22 1.23  CALCIUM 9.6 9.5  GFRNONAA >60 >60  ANIONGAP 11 16*    Recent Labs  Lab 11/04/19 1818  PROT 8.4*  ALBUMIN 4.3  AST 35  ALT 17  ALKPHOS 206*  BILITOT 1.8*   Hematology Recent Labs  Lab 11/04/19 1818 11/05/19 0422  WBC 5.6 5.7  RBC 4.16* 3.83*  HGB 13.4 12.3*  HCT 38.7* 35.6*  MCV 93.0 93.0  MCH 32.2 32.1  MCHC 34.6 34.6  RDW 15.8* 15.9*  PLT 216 211   BNP Recent Labs  Lab  11/04/19 1818  BNP 1,651.0*    DDimer No results for input(s): DDIMER in the last 168 hours.   Radiology/Studies:  CT ABDOMEN PELVIS WO CONTRAST  Result Date: 11/05/2019 CLINICAL DATA:  Abdominal distension with urinary retention. EXAM: CT ABDOMEN AND PELVIS WITHOUT CONTRAST TECHNIQUE: Multidetector CT imaging of the abdomen and pelvis was performed following the standard protocol without IV contrast. COMPARISON:  07/13/2019 FINDINGS: Lower chest:  Small right pleural effusion.  Cardiomegaly. Hepatobiliary: No focal liver abnormality.No evidence of biliary obstruction or stone. Pancreas: Unremarkable. Spleen: Unremarkable. Adrenals/Urinary Tract: Negative adrenals. No hydronephrosis or stone. No over distension of the bladder. Small volume bladder gas likely related to prior manipulation. Stomach/Bowel:  No obstruction. No visible bowel inflammation Vascular/Lymphatic: No acute vascular abnormality. Atheromatous calcification. No mass or adenopathy. Reproductive:Negative Other: Large volume ascites which has increased from prior and has progressed. Musculoskeletal: No acute abnormalities. IMPRESSION: 1. Large volume ascites, increased from July 2021. 2. History of urinary retention but the bladder is not over distended currently. 3. Small right pleural effusion. Electronically Signed   By: Marnee Spring M.D.   On: 11/05/2019 06:48   DG Chest 2 View  Result Date: 11/04/2019 CLINICAL DATA:  Shortness of breath EXAM: CHEST - 2 VIEW COMPARISON:  08/06/2012, 07/12/2019 CT 07/13/2019 FINDINGS: Moderate cardiomegaly. Possible trace pleural effusion. No consolidation or pneumothorax. IMPRESSION: Cardiomegaly with possible trace pleural effusions. Electronically Signed   By: Selena Batten  Jake Samples M.D.   On: 11/04/2019 19:05   DG Abd 1 View  Result Date: 11/05/2019 CLINICAL DATA:  Abdominal distention and pain. EXAM: ABDOMEN - 1 VIEW COMPARISON:  CT 07/13/2019.  Abdomen 12/02/2005. FINDINGS: Overall increased  density of the abdomen noted. Ascites cannot be excluded. Slightly prominent air-filled loops of small and large bowel noted. Mild adynamic ileus may be present. Follow-up exam suggested demonstrate resolution and to exclude developing bowel obstruction. No free air. No acute bony abnormality. Degenerative changes lumbar spine. IMPRESSION: 1. Overall increased density of the abdomen. Ascites cannot be excluded. 2. Slightly prominent air-filled loops of small and large bowel noted. Mild adynamic ileus may be present. Follow-up exam suggested demonstrate resolution and to exclude developing bowel obstruction. Electronically Signed   By: Maisie Fus  Register   On: 11/05/2019 05:12   DG Chest Port 1 View  Result Date: 11/05/2019 CLINICAL DATA:  CHF. EXAM: PORTABLE CHEST 1 VIEW COMPARISON:  11/04/2019. FINDINGS: Cardiomegaly with mild pulmonary venous congestion again noted. Similar findings on prior exam. Low lung volumes with mild bibasilar atelectasis. No pleural effusion or pneumothorax. No acute bony abnormality identified. IMPRESSION: 1. Cardiomegaly with mild pulmonary venous congestion again noted. Similar findings on prior exam. 2. Low lung volumes with mild bibasilar atelectasis. Electronically Signed   By: Maisie Fus  Register   On: 11/05/2019 05:34   ECHOCARDIOGRAM COMPLETE  Result Date: 11/05/2019    ECHOCARDIOGRAM REPORT   Patient Name:   Zymire Canino Date of Exam: 11/05/2019 Medical Rec #:  [id number]       Height:       67.0 in Accession #:    [id number]      Weight:       154.5 lb Date of Birth:  1955-11-21      BSA:          1.812 m Patient Age:    64 years        BP:           141/75 mmHg Patient Gender: M               HR:           68 bpm. Exam Location:  Inpatient Procedure: 2D Echo, Cardiac Doppler and Color Doppler Indications:    CHF  History:        Patient has prior history of Echocardiogram examinations, most                 recent 05/07/2019. CHF and Cardiomyopathy, Stroke,                  Arrythmias:Atrial Fibrillation, Signs/Symptoms:Dyspnea and                 Pleural effusion; Risk Factors:Hypertension and Diabetes.  Sonographer:    Lavenia Atlas Referring Phys: [id number] ASIA B ZIERLE-GHOSH  Sonographer Comments: Patient very confused. IMPRESSIONS  1. Left ventricular ejection fraction, by estimation, is 15-20%. The left ventricle has severely decreased function. The left ventricle demonstrates global hypokinesis. Left ventricular diastolic function could not be evaluated. Elevated left atrial pressure. There is the interventricular septum is flattened in diastole ('D' shaped left ventricle), consistent with right ventricular volume overload.  2. Right ventricular systolic function is severely reduced. The right ventricular size is severely enlarged. There is mildly elevated pulmonary artery systolic pressure. The estimated right ventricular systolic pressure is 39.8 mmHg.  3. Left atrial size was severely dilated.  4. Right atrial size was severely dilated.  5. The mitral  valve is grossly normal. Mild mitral valve regurgitation. No evidence of mitral stenosis.  6. The tricuspid valve is abnormal. Tricuspid valve regurgitation is severe.  7. The aortic valve is tricuspid. Aortic valve regurgitation is not visualized. No aortic stenosis is present.  8. The inferior vena cava is dilated in size with <50% respiratory variability, suggesting right atrial pressure of 15 mmHg. Comparison(s): Changes from prior study are noted. LVEF remains ~20% with global HK. RV severely dilated with severely reduced function. TR is now severe. FINDINGS  Left Ventricle: Left ventricular ejection fraction, by estimation, is 15-20%. The left ventricle has severely decreased function. The left ventricle demonstrates global hypokinesis. The left ventricular internal cavity size was normal in size. There is no left ventricular hypertrophy. The interventricular septum is flattened in diastole ('D' shaped left ventricle),  consistent with right ventricular volume overload. Left ventricular diastolic function could not be evaluated due to atrial fibrillation. Left ventricular diastolic function could not be evaluated. Elevated left atrial pressure. Right Ventricle: The right ventricular size is severely enlarged. No increase in right ventricular wall thickness. Right ventricular systolic function is severely reduced. There is mildly elevated pulmonary artery systolic pressure. The tricuspid regurgitant velocity is 2.49 m/s, and with an assumed right atrial pressure of 15 mmHg, the estimated right ventricular systolic pressure is 39.8 mmHg. Left Atrium: Left atrial size was severely dilated. Right Atrium: Right atrial size was severely dilated. Pericardium: Trivial pericardial effusion is present. Mitral Valve: The mitral valve is grossly normal. Mild mitral valve regurgitation. No evidence of mitral valve stenosis. Tricuspid Valve: The tricuspid valve is abnormal. Tricuspid valve regurgitation is severe. No evidence of tricuspid stenosis. Aortic Valve: The aortic valve is tricuspid. Aortic valve regurgitation is not visualized. No aortic stenosis is present. Pulmonic Valve: The pulmonic valve was grossly normal. Pulmonic valve regurgitation is mild. No evidence of pulmonic stenosis. Aorta: The aortic root is normal in size and structure. Venous: The inferior vena cava is dilated in size with less than 50% respiratory variability, suggesting right atrial pressure of 15 mmHg. IAS/Shunts: The atrial septum is grossly normal.  LEFT VENTRICLE PLAX 2D LVIDd:         4.80 cm  Diastology LVIDs:         4.00 cm  LV e' medial:    4.13 cm/s LV PW:         1.10 cm  LV E/e' medial:  12.4 LV IVS:        1.10 cm  LV e' lateral:   8.16 cm/s LVOT diam:     2.00 cm  LV E/e' lateral: 6.3 LV SV:         47 LV SV Index:   26 LVOT Area:     3.14 cm  RIGHT VENTRICLE RV Basal diam:  5.00 cm RV S prime:     5.33 cm/s TAPSE (M-mode): 2.1 cm LEFT ATRIUM               Index       RIGHT ATRIUM           Index LA diam:        5.70 cm  3.15 cm/m  RA Area:     32.00 cm LA Vol (A2C):   111.0 ml 61.25 ml/m RA Volume:   123.00 ml 67.87 ml/m LA Vol (A4C):   94.2 ml  51.98 ml/m LA Biplane Vol: 105.0 ml 57.94 ml/m  AORTIC VALVE LVOT Vmax:   86.80 cm/s LVOT Vmean:  55.500 cm/s LVOT VTI:    0.151 m  AORTA Ao Root diam: 2.70 cm MITRAL VALVE               TRICUSPID VALVE MV Area (PHT): 3.42 cm    TR Peak grad:   24.8 mmHg MV Decel Time: 222 msec    TR Vmax:        249.00 cm/s MV E velocity: 51.20 cm/s MV A velocity: 23.15 cm/s  SHUNTS MV E/A ratio:  2.21        Systemic VTI:  0.15 m                            Systemic Diam: 2.00 cm Lennie Odor MD Electronically signed by Lennie Odor MD Signature Date/Time: 11/05/2019/1:35:08 PM    Final      Assessment and Plan:   Chronic systolic heart failure secondary to nonischemic cardiomyopathy/severe biventricular heart failure/cardiogenic shock -EF 15-20 from 25%.  On low-dose Entresto with Toprol on hold secondary to hypotension as low as systolic in the upper 70s. -Lasix at home was increased sequentially.  He was not improving.  Hypotension noted early in this admission especially after 60 mg of IV Lasix was administered.  At this point, he appears intravascularly dry.  Agreed with primary cc bolus of fluid to replenish.  Blood pressure has responded appropriately.  -Continue to hold Entresto -Continue to hold Toprol-heart rate is adequate -Continue to hold Lasix  -Given his hyponatremia, hypotension, elevated creatinine these are all poor prognostic indicators for his underlying nonischemic cardiomyopathy.  Continue to monitor closely. I would suggest palliative care consult given the end-stage nature of his underlying condition.  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  I will stop his aspirin since he is on Eliquis.  Atrial fibrillation/flutter -Permanent with rate  control.  On Eliquis  Attempted to call brother on patient contacts Abimael Zeiter however no answer.     For questions or updates, please contact [facility] Please consult [email or web address] for contact info under    Signed, Donato Schultz, MD  11/05/2019 3:05 PM'

## 2019-11-05 NOTE — Assessment & Plan Note (Signed)
-   hypoglycemic on admission; possibly some contribution from sulfonylurea -Continue hypoglycemic protocol - SSI in case elevates

## 2019-11-05 NOTE — Progress Notes (Signed)
PROGRESS NOTE    [name]   ZOX:[id number]  DOB: 1955-06-18  DOA: 11/04/2019     0  PCP: Mirna Mires, MD  CC: Shortness of breath and abdominal swelling  Hospital Course: [name] is a 64 y.o. 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 Fib/flutter who presented to the ED with a chief complaint of dyspnea.   He has become progressively more dyspneic at home especially with any movement.  He had been ambulating with a cane and started developing worsening distention/swelling in his abdomen. His Lasix dosing was increased outpatient per his cardiologist with minimal improvement. Due to his progressively worsening dyspnea and abdominal swelling, he was brought to the ER for further evaluation.  On work-up he was found to be hypothermic, hypoglycemic, hyponatremic, bradycardic, hypotensive, slightly hyper tachypneic. He had elevated BNP, indeterminate troponins.  He was admitted for further work-up and treatment.    Interval History:  Seen multiple times today.  This morning he was resting comfortably in bed, blood pressure had improved some.  Urine output was still low despite Lasix use.  His morning Entresto was held due to soft blood pressures. Later in the afternoon a rapid response was called due to worsening hypotension.  He was given a 500 cc normal saline bolus with some response and improvement in blood pressure. His clinical picture was consistent with probable cardiogenic shock with poor prognosis.  I called and discussed case with his brother and niece Engineer, mining).  Questions were answered and update given.  They are hopeful for some improvement.  Stated that we would continue to discuss ongoing course with them and continue further goals of care discussions.  Old records reviewed in assessment of this  patient  ROS: Review of systems not obtained due to patient factors.  Patient is nonverbal and unable to communicate effectively.  Mostly points at things  Assessment & Plan: * Acute on chronic systolic CHF (congestive heart failure) (HCC) - echo in May 2021 shows EF 20-25%. Repeat echo now shows EF 15-20% - albumin surprisingly not low; 4.3 - BNP at baseline will be elevated with Entresto use however does appear overtly higher on admission, 1651; his edema is most in his abdomen (also confirmed by niece as they say abdomen has swollen tremendously recently); this is poor prognosticator with worsening CHF leading to cardiac ascites  - patient initially started on IV lasix however this pm became bradycardic and hypotensive (he did have a BB at home PTA) - s/p 500 cc NS bolus during rapid response 10/29; BP stable for now - hold off on further lasix; may possibly need repeat small bolus' - see cardiogenic shock  Cardiogenic shock (HCC) - greatly appreciate cardiology evaluation; I agree with cardiogenic shock and poor prognosis - he has developed cardiac ascites rapidly, hyponatremia, hypotension, hypothermia, and now has decreasing urine output (creatinine likely to start rising) - I've discussed briefly with niece and brother today regarding this is foreshadowing a poor prognosis; after cardiology evaluation, I again called niece Engineer, mining) to talk but no answer - if any further decline would certainly encourage family for DNR and pursuing comfort; if codes likely to not survive and would discuss with family immediately during code to stop CPR  Hypothermia - see cardiogenic shock   Hyponatremia - worsening Na since July - patient cannot tolerate further diuresis at this point; volume is in abdomen  not peripherally and albumin normal - monitor for now  Persistent atrial fibrillation (HCC) - currently bradycardic and hypotensive - hold BB - continue Eliquis for now   Type 2 diabetes  mellitus without complication, without long-term current use of insulin (HCC) - hypoglycemic on admission; possibly some contribution from sulfonylurea -Continue hypoglycemic protocol - SSI in case elevates   History of stroke - residual severe expressive aphasia (very hard to know what patient is wanting and mostly points to things), also has right side paralysis  HLD (hyperlipidemia) - patient not likely to benefit from any further statin use for 10-year mortality    Antimicrobials: None  DVT prophylaxis: Eliquis Code Status: Full Family Communication: Brother and niece on phone Disposition Plan: Status is: Inpatient  Remains inpatient appropriate because:Hemodynamically unstable, Ongoing diagnostic testing needed not appropriate for outpatient work up, Unsafe d/c plan, IV treatments appropriate due to intensity of illness or inability to take PO and Inpatient level of care appropriate due to severity of illness   Dispo: The patient is from: Home              Anticipated d/c is to: Pending clinical course, poor prognosis              Anticipated d/c date is: > 3 days              Patient currently is not medically stable to d/c.       Objective: Blood pressure (!) 88/64, pulse (!) 57, temperature 98.4 F (36.9 C), temperature source Rectal, resp. rate 19, height 5\' 7"  (1.702 m), weight 70.1 kg, SpO2 100 %.  Examination: General appearance: Awake and alert resting in bed seen this morning in no distress with obvious distended abdomen Head: Normocephalic, without obvious abnormality, atraumatic Eyes: EOMI Lungs: Mostly clear breath sounds bilaterally Heart: Bradycardic, irregularly irregular rhythm Abdomen: Severely distended.  Hypoactive bowel sounds and distant.  Nontender Extremities: Trace lower extremity edema Skin: mobility and turgor normal Neurologic: Understands commands and follows them but cannot speak.  Consultants:   Cardiology   Procedures:    none  Data Reviewed: I have personally reviewed following labs and imaging studies Results for orders placed or performed during the hospital encounter of 11/04/19 (from the past 24 hour(s))  Comprehensive metabolic panel     Status: Abnormal   Collection Time: 11/04/19  6:18 PM  Result Value Ref Range   Sodium 124 (L) 135 - 145 mmol/L   Potassium 5.6 (H) 3.5 - 5.1 mmol/L   Chloride 92 (L) 98 - 111 mmol/L   CO2 21 (L) 22 - 32 mmol/L   Glucose, Bld 63 (L) 70 - 99 mg/dL   BUN 24 (H) 8 - 23 mg/dL   Creatinine, Ser 11/06/19 0.61 - 1.24 mg/dL   Calcium 9.6 8.9 - 9.52 mg/dL   Total Protein 8.4 (H) 6.5 - 8.1 g/dL   Albumin 4.3 3.5 - 5.0 g/dL   AST 35 15 - 41 U/L   ALT 17 0 - 44 U/L   Alkaline Phosphatase 206 (H) 38 - 126 U/L   Total Bilirubin 1.8 (H) 0.3 - 1.2 mg/dL   GFR, Estimated 84.1 >32 mL/min   Anion gap 11 5 - 15  CBC with Differential     Status: Abnormal   Collection Time: 11/04/19  6:18 PM  Result Value Ref Range   WBC 5.6 4.0 - 10.5 K/uL   RBC 4.16 (L) 4.22 - 5.81 MIL/uL   Hemoglobin 13.4 13.0 - 17.0  g/dL   HCT 16.1 (L) 39 - 52 %   MCV 93.0 80.0 - 100.0 fL   MCH 32.2 26.0 - 34.0 pg   MCHC 34.6 30.0 - 36.0 g/dL   RDW 09.6 (H) 04.5 - 40.9 %   Platelets 216 150 - 400 K/uL   nRBC 0.0 0.0 - 0.2 %   Neutrophils Relative % 75 %   Neutro Abs 4.2 1.7 - 7.7 K/uL   Lymphocytes Relative 13 %   Lymphs Abs 0.7 0.7 - 4.0 K/uL   Monocytes Relative 8 %   Monocytes Absolute 0.5 0.1 - 1.0 K/uL   Eosinophils Relative 3 %   Eosinophils Absolute 0.2 0.0 - 0.5 K/uL   Basophils Relative 1 %   Basophils Absolute 0.0 0.0 - 0.1 K/uL   Immature Granulocytes 0 %   Abs Immature Granulocytes 0.02 0.00 - 0.07 K/uL  Troponin I (High Sensitivity)     Status: Abnormal   Collection Time: 11/04/19  6:18 PM  Result Value Ref Range   Troponin I (High Sensitivity) 26 (H) <18 ng/L  Brain natriuretic peptide     Status: Abnormal   Collection Time: 11/04/19  6:18 PM  Result Value Ref Range   B  Natriuretic Peptide 1,651.0 (H) 0.0 - 100.0 pg/mL  Respiratory Panel by RT PCR (Flu A&B, Covid) - Nasopharyngeal Swab     Status: None   Collection Time: 11/04/19  6:21 PM   Specimen: Nasopharyngeal Swab  Result Value Ref Range   SARS Coronavirus 2 by RT PCR NEGATIVE NEGATIVE   Influenza A by PCR NEGATIVE NEGATIVE   Influenza B by PCR NEGATIVE NEGATIVE  Group A Strep by PCR     Status: None   Collection Time: 11/04/19  6:45 PM   Specimen: Throat; Sterile Swab  Result Value Ref Range   Group A Strep by PCR NOT DETECTED NOT DETECTED  TSH     Status: None   Collection Time: 11/04/19  9:00 PM  Result Value Ref Range   TSH 4.367 0.350 - 4.500 uIU/mL  Troponin I (High Sensitivity)     Status: Abnormal   Collection Time: 11/04/19  9:42 PM  Result Value Ref Range   Troponin I (High Sensitivity) 24 (H) <18 ng/L  POC CBG, ED     Status: Abnormal   Collection Time: 11/04/19 10:05 PM  Result Value Ref Range   Glucose-Capillary 20 (LL) 70 - 99 mg/dL   Comment 1 Notify RN   Glucose, capillary     Status: Abnormal   Collection Time: 11/04/19 11:06 PM  Result Value Ref Range   Glucose-Capillary 118 (H) 70 - 99 mg/dL  Magnesium     Status: None   Collection Time: 11/05/19  4:22 AM  Result Value Ref Range   Magnesium 2.0 1.7 - 2.4 mg/dL  CBC WITH DIFFERENTIAL     Status: Abnormal   Collection Time: 11/05/19  4:22 AM  Result Value Ref Range   WBC 5.7 4.0 - 10.5 K/uL   RBC 3.83 (L) 4.22 - 5.81 MIL/uL   Hemoglobin 12.3 (L) 13.0 - 17.0 g/dL   HCT 81.1 (L) 39 - 52 %   MCV 93.0 80.0 - 100.0 fL   MCH 32.1 26.0 - 34.0 pg   MCHC 34.6 30.0 - 36.0 g/dL   RDW 91.4 (H) 78.2 - 95.6 %   Platelets 211 150 - 400 K/uL   nRBC 0.0 0.0 - 0.2 %   Neutrophils Relative % 79 %  Neutro Abs 4.5 1.7 - 7.7 K/uL   Lymphocytes Relative 10 %   Lymphs Abs 0.6 (L) 0.7 - 4.0 K/uL   Monocytes Relative 8 %   Monocytes Absolute 0.5 0.1 - 1.0 K/uL   Eosinophils Relative 2 %   Eosinophils Absolute 0.1 0.0 - 0.5 K/uL    Basophils Relative 1 %   Basophils Absolute 0.0 0.0 - 0.1 K/uL   Immature Granulocytes 0 %   Abs Immature Granulocytes 0.02 0.00 - 0.07 K/uL  Basic metabolic panel     Status: Abnormal   Collection Time: 11/05/19  4:22 AM  Result Value Ref Range   Sodium 126 (L) 135 - 145 mmol/L   Potassium 5.0 3.5 - 5.1 mmol/L   Chloride 91 (L) 98 - 111 mmol/L   CO2 19 (L) 22 - 32 mmol/L   Glucose, Bld 61 (L) 70 - 99 mg/dL   BUN 25 (H) 8 - 23 mg/dL   Creatinine, Ser 5.30 0.61 - 1.24 mg/dL   Calcium 9.5 8.9 - 05.1 mg/dL   GFR, Estimated >10 >21 mL/min   Anion gap 16 (H) 5 - 15  Glucose, capillary     Status: Abnormal   Collection Time: 11/05/19  8:07 AM  Result Value Ref Range   Glucose-Capillary 53 (L) 70 - 99 mg/dL  Glucose, capillary     Status: Abnormal   Collection Time: 11/05/19  8:39 AM  Result Value Ref Range   Glucose-Capillary 131 (H) 70 - 99 mg/dL  Glucose, capillary     Status: Abnormal   Collection Time: 11/05/19 12:29 PM  Result Value Ref Range   Glucose-Capillary 116 (H) 70 - 99 mg/dL    Recent Results (from the past 240 hour(s))  Respiratory Panel by RT PCR (Flu A&B, Covid) - Nasopharyngeal Swab     Status: None   Collection Time: 11/04/19  6:21 PM   Specimen: Nasopharyngeal Swab  Result Value Ref Range Status   SARS Coronavirus 2 by RT PCR NEGATIVE NEGATIVE Final    Comment: (NOTE) SARS-CoV-2 target nucleic acids are NOT DETECTED.  The SARS-CoV-2 RNA is generally detectable in upper respiratoy specimens during the acute phase of infection. The lowest concentration of SARS-CoV-2 viral copies this assay can detect is 131 copies/mL. A negative result does not preclude SARS-Cov-2 infection and should not be used as the sole basis for treatment or other patient management decisions. A negative result may occur with  improper specimen collection/handling, submission of specimen other than nasopharyngeal swab, presence of viral mutation(s) within the areas targeted by this  assay, and inadequate number of viral copies (<131 copies/mL). A negative result must be combined with clinical observations, patient history, and epidemiological information. The expected result is Negative.  Fact Sheet for Patients:  https://www.moore.com/  Fact Sheet for Healthcare Providers:  https://www.young.biz/  This test is no t yet approved or cleared by the [place] FDA and  has been authorized for detection and/or diagnosis of SARS-CoV-2 by FDA under an Emergency Use Authorization (EUA). This EUA will remain  in effect (meaning this test can be used) for the duration of the COVID-19 declaration under Section 564(b)(1) of the Act, 21 U.S.C. section 360bbb-3(b)(1), unless the authorization is terminated or revoked sooner.     Influenza A by PCR NEGATIVE NEGATIVE Final   Influenza B by PCR NEGATIVE NEGATIVE Final    Comment: (NOTE) The Xpert Xpress SARS-CoV-2/FLU/RSV assay is intended as an aid in  the diagnosis of influenza from Nasopharyngeal swab specimens  and  should not be used as a sole basis for treatment. Nasal washings and  aspirates are unacceptable for Xpert Xpress SARS-CoV-2/FLU/RSV  testing.  Fact Sheet for Patients: https://www.moore.com/  Fact Sheet for Healthcare Providers: https://www.young.biz/  This test is not yet approved or cleared by the [place] FDA and  has been authorized for detection and/or diagnosis of SARS-CoV-2 by  FDA under an Emergency Use Authorization (EUA). This EUA will remain  in effect (meaning this test can be used) for the duration of the  Covid-19 declaration under Section 564(b)(1) of the Act, 21  U.S.C. section 360bbb-3(b)(1), unless the authorization is  terminated or revoked. Performed at [facility], [address], [place], [place] [address]   Group A Strep by PCR     Status: None   Collection Time: 11/04/19   6:45 PM   Specimen: Throat; Sterile Swab  Result Value Ref Range Status   Group A Strep by PCR NOT DETECTED NOT DETECTED Final    Comment: Performed at [facility], [address] [place], [place] [address]     Radiology Studies: CT ABDOMEN PELVIS WO CONTRAST  Result Date: 11/05/2019 CLINICAL DATA:  Abdominal distension with urinary retention. EXAM: CT ABDOMEN AND PELVIS WITHOUT CONTRAST TECHNIQUE: Multidetector CT imaging of the abdomen and pelvis was performed following the standard protocol without IV contrast. COMPARISON:  07/13/2019 FINDINGS: Lower chest:  Small right pleural effusion.  Cardiomegaly. Hepatobiliary: No focal liver abnormality.No evidence of biliary obstruction or stone. Pancreas: Unremarkable. Spleen: Unremarkable. Adrenals/Urinary Tract: Negative adrenals. No hydronephrosis or stone. No over distension of the bladder. Small volume bladder gas likely related to prior manipulation. Stomach/Bowel:  No obstruction. No visible bowel inflammation Vascular/Lymphatic: No acute vascular abnormality. Atheromatous calcification. No mass or adenopathy. Reproductive:Negative Other: Large volume ascites which has increased from prior and has progressed. Musculoskeletal: No acute abnormalities. IMPRESSION: 1. Large volume ascites, increased from July 2021. 2. History of urinary retention but the bladder is not over distended currently. 3. Small right pleural effusion. Electronically Signed   By: Marnee Spring M.D.   On: 11/05/2019 06:48   DG Chest 2 View  Result Date: 11/04/2019 CLINICAL DATA:  Shortness of breath EXAM: CHEST - 2 VIEW COMPARISON:  08/06/2012, 07/12/2019 CT 07/13/2019 FINDINGS: Moderate cardiomegaly. Possible trace pleural effusion. No consolidation or pneumothorax. IMPRESSION: Cardiomegaly with possible trace pleural effusions. Electronically Signed   By: Jasmine Pang M.D.   On: 11/04/2019 19:05   DG Abd 1 View  Result Date: 11/05/2019 CLINICAL  DATA:  Abdominal distention and pain. EXAM: ABDOMEN - 1 VIEW COMPARISON:  CT 07/13/2019.  Abdomen 12/02/2005. FINDINGS: Overall increased density of the abdomen noted. Ascites cannot be excluded. Slightly prominent air-filled loops of small and large bowel noted. Mild adynamic ileus may be present. Follow-up exam suggested demonstrate resolution and to exclude developing bowel obstruction. No free air. No acute bony abnormality. Degenerative changes lumbar spine. IMPRESSION: 1. Overall increased density of the abdomen. Ascites cannot be excluded. 2. Slightly prominent air-filled loops of small and large bowel noted. Mild adynamic ileus may be present. Follow-up exam suggested demonstrate resolution and to exclude developing bowel obstruction. Electronically Signed   By: Maisie Fus  Register   On: 11/05/2019 05:12   DG Chest Port 1 View  Result Date: 11/05/2019 CLINICAL DATA:  CHF. EXAM: PORTABLE CHEST 1 VIEW COMPARISON:  11/04/2019. FINDINGS: Cardiomegaly with mild pulmonary venous congestion again noted. Similar findings on prior exam. Low lung volumes with mild bibasilar atelectasis.  No pleural effusion or pneumothorax. No acute bony abnormality identified. IMPRESSION: 1. Cardiomegaly with mild pulmonary venous congestion again noted. Similar findings on prior exam. 2. Low lung volumes with mild bibasilar atelectasis. Electronically Signed   By: Maisie Fus  Register   On: 11/05/2019 05:34   ECHOCARDIOGRAM COMPLETE  Result Date: 11/05/2019    ECHOCARDIOGRAM REPORT   Patient Name:   [name] Date of Exam: 11/05/2019 Medical Rec #:  [id number]       Height:       67.0 in Accession #:    [id number]      Weight:       154.5 lb Date of Birth:  02-28-1955      BSA:          1.812 m Patient Age:    64 years        BP:           141/75 mmHg Patient Gender: M               HR:           68 bpm. Exam Location:  Inpatient Procedure: 2D Echo, Cardiac Doppler and Color Doppler Indications:    CHF  History:         Patient has prior history of Echocardiogram examinations, most                 recent 05/07/2019. CHF and Cardiomyopathy, Stroke,                 Arrythmias:Atrial Fibrillation, Signs/Symptoms:Dyspnea and                 Pleural effusion; Risk Factors:Hypertension and Diabetes.  Sonographer:    Lavenia Atlas Referring Phys: [id number] ASIA B ZIERLE-GHOSH  Sonographer Comments: Patient very confused. IMPRESSIONS  1. Left ventricular ejection fraction, by estimation, is 15-20%. The left ventricle has severely decreased function. The left ventricle demonstrates global hypokinesis. Left ventricular diastolic function could not be evaluated. Elevated left atrial pressure. There is the interventricular septum is flattened in diastole ('D' shaped left ventricle), consistent with right ventricular volume overload.  2. Right ventricular systolic function is severely reduced. The right ventricular size is severely enlarged. There is mildly elevated pulmonary artery systolic pressure. The estimated right ventricular systolic pressure is 39.8 mmHg.  3. Left atrial size was severely dilated.  4. Right atrial size was severely dilated.  5. The mitral valve is grossly normal. Mild mitral valve regurgitation. No evidence of mitral stenosis.  6. The tricuspid valve is abnormal. Tricuspid valve regurgitation is severe.  7. The aortic valve is tricuspid. Aortic valve regurgitation is not visualized. No aortic stenosis is present.  8. The inferior vena cava is dilated in size with <50% respiratory variability, suggesting right atrial pressure of 15 mmHg. Comparison(s): Changes from prior study are noted. LVEF remains ~20% with global HK. RV severely dilated with severely reduced function. TR is now severe. FINDINGS  Left Ventricle: Left ventricular ejection fraction, by estimation, is 15-20%. The left ventricle has severely decreased function. The left ventricle demonstrates global hypokinesis. The left ventricular internal cavity size  was normal in size. There is no left ventricular hypertrophy. The interventricular septum is flattened in diastole ('D' shaped left ventricle), consistent with right ventricular volume overload. Left ventricular diastolic function could not be evaluated due to atrial fibrillation. Left ventricular diastolic function could not be evaluated. Elevated left atrial pressure. Right Ventricle: The right ventricular size is severely enlarged. No increase in right  ventricular wall thickness. Right ventricular systolic function is severely reduced. There is mildly elevated pulmonary artery systolic pressure. The tricuspid regurgitant velocity is 2.49 m/s, and with an assumed right atrial pressure of 15 mmHg, the estimated right ventricular systolic pressure is 39.8 mmHg. Left Atrium: Left atrial size was severely dilated. Right Atrium: Right atrial size was severely dilated. Pericardium: Trivial pericardial effusion is present. Mitral Valve: The mitral valve is grossly normal. Mild mitral valve regurgitation. No evidence of mitral valve stenosis. Tricuspid Valve: The tricuspid valve is abnormal. Tricuspid valve regurgitation is severe. No evidence of tricuspid stenosis. Aortic Valve: The aortic valve is tricuspid. Aortic valve regurgitation is not visualized. No aortic stenosis is present. Pulmonic Valve: The pulmonic valve was grossly normal. Pulmonic valve regurgitation is mild. No evidence of pulmonic stenosis. Aorta: The aortic root is normal in size and structure. Venous: The inferior vena cava is dilated in size with less than 50% respiratory variability, suggesting right atrial pressure of 15 mmHg. IAS/Shunts: The atrial septum is grossly normal.  LEFT VENTRICLE PLAX 2D LVIDd:         4.80 cm  Diastology LVIDs:         4.00 cm  LV e' medial:    4.13 cm/s LV PW:         1.10 cm  LV E/e' medial:  12.4 LV IVS:        1.10 cm  LV e' lateral:   8.16 cm/s LVOT diam:     2.00 cm  LV E/e' lateral: 6.3 LV SV:         47 LV SV  Index:   26 LVOT Area:     3.14 cm  RIGHT VENTRICLE RV Basal diam:  5.00 cm RV S prime:     5.33 cm/s TAPSE (M-mode): 2.1 cm LEFT ATRIUM              Index       RIGHT ATRIUM           Index LA diam:        5.70 cm  3.15 cm/m  RA Area:     32.00 cm LA Vol (A2C):   111.0 ml 61.25 ml/m RA Volume:   123.00 ml 67.87 ml/m LA Vol (A4C):   94.2 ml  51.98 ml/m LA Biplane Vol: 105.0 ml 57.94 ml/m  AORTIC VALVE LVOT Vmax:   86.80 cm/s LVOT Vmean:  55.500 cm/s LVOT VTI:    0.151 m  AORTA Ao Root diam: 2.70 cm MITRAL VALVE               TRICUSPID VALVE MV Area (PHT): 3.42 cm    TR Peak grad:   24.8 mmHg MV Decel Time: 222 msec    TR Vmax:        249.00 cm/s MV E velocity: 51.20 cm/s MV A velocity: 23.15 cm/s  SHUNTS MV E/A ratio:  2.21        Systemic VTI:  0.15 m                            Systemic Diam: 2.00 cm Lennie Odor MD Electronically signed by Lennie Odor MD Signature Date/Time: 11/05/2019/1:35:08 PM    Final    CT ABDOMEN PELVIS WO CONTRAST  Final Result    DG Chest Port 1 View  Final Result    DG Abd 1 View  Final Result    DG Chest 2 View  Final Result      Scheduled Meds: . apixaban  5 mg Oral BID  . chlorhexidine  15 mL Mouth Rinse BID  . Chlorhexidine Gluconate Cloth  6 each Topical Daily  . insulin aspart  0-9 Units Subcutaneous TID WC  . lidocaine  1 application Urethral Once  . mouth rinse  15 mL Mouth Rinse q12n4p  . simvastatin  40 mg Oral Daily   PRN Meds: acetaminophen **OR** acetaminophen, lidocaine, loratadine, ondansetron **OR** ondansetron (ZOFRAN) IV, oxyCODONE Continuous Infusions:   LOS: 0 days  Time spent: Greater than 50% of the 35 minute visit was spent in counseling/coordination of care for the patient as laid out in the A&P.   Lewie Chamber, MD [facility] 11/05/2019, 5:02 PM

## 2019-11-05 NOTE — Progress Notes (Signed)
Patient given urinal to attempt to void. Only able to void 50 ml. Orders given to place indwelling Coude catheter with the use of lidocaine per request. Insertion was successful. Patient tolerated better. Will monitor urine output and reassess with bladder scan.

## 2019-11-05 NOTE — Evaluation (Signed)
Clinical/Bedside Swallow Evaluation Patient Details  Name: [name] MRN: [id number] Date of Birth: 1955/08/02  Today's Date: 11/05/2019 Time: SLP Start Time (ACUTE ONLY): 1000 SLP Stop Time (ACUTE ONLY): 1030 SLP Time Calculation (min) (ACUTE ONLY): 30 min  Past Medical History:  Past Medical History:  Diagnosis Date  . Atrial flutter (HCC)   . CHF (congestive heart failure) (HCC)   . Diabetes mellitus   . Hypertension   . 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 Stroke [facility])    Past Surgical History:  Past Surgical History:  Procedure Laterality Date  . CARDIOVERSION N/A 10/10/2016   Procedure: CARDIOVERSION;  Surgeon: Lewayne Bunting, MD;  Location: [facility] ENDOSCOPY;  Service: Cardiovascular;  Laterality: N/A;  . RIGHT/LEFT HEART CATH AND CORONARY ANGIOGRAPHY N/A 09/06/2016   Procedure: RIGHT/LEFT HEART CATH AND CORONARY ANGIOGRAPHY;  Surgeon: Lennette Bihari, MD;  Location: [facility] INVASIVE CV LAB;  Service: Cardiovascular;  Laterality: N/A;  . TRACHEOSTOMY     HPI:  64yo male admitted 10//28/21 with dyspnea. 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 CXR = Cardiomegaly with mild pulmonary venous congestion again noted.Similar findings on prior exam. Low lung volumes with mild bibasilar atelectasis. MBS 2007   Assessment / Plan / Recommendation Clinical Impression  Pt seen at bedside to assess swallow function and safety, and to identify least restrictive diet. Pt was awake and alert, seated upright in bed upon arrival of SLP. No family present. Pt presents with history of LCVA with residual aphasia and apraxia. He exhibits right orofacial weakness, and speech is mildly dysarthric. He has difficulty following commands for CN exam due to apraxia, but is able to answer simple yes/no questions accurately. Pt did not indicate  pain or difficulty swallowing when asked. He tolerated trials of thin liquid, puree, and solid textures without significant oral issues, and without overt s/s aspiration. Will begin dys 3 (soft) diet and thin liquids with crushed meds. I have requested the kitchen cut solids into bite size pieces due to RUE flaccidity. Safe swallow precautions posted at [facility], and RN and MD have been informed. SLP will follow to assess diet tolerance and continue education.   SLP Visit Diagnosis: Dysphagia, unspecified (R13.10)    Aspiration Risk  Mild aspiration risk    Diet Recommendation Dysphagia 3 (Mech soft);Thin liquid. Chop solids into bite size pieces (RUE flaccid)   Liquid Administration via: Cup;Straw Medication Administration: Crushed with puree Supervision: Patient able to self feed;Staff to assist with self feeding;Intermittent supervision to cue for compensatory strategies Compensations: Minimize environmental distractions;Slow rate;Small sips/bites Postural Changes: Seated upright at 90 degrees    Other  Recommendations Oral Care Recommendations: Oral care BID   Follow up Recommendations 24 hour supervision/assistance      Frequency and Duration min 1 x/week  1 week;2 weeks       Prognosis Prognosis for Safe Diet Advancement: Good Barriers to Reach Goals: Language deficits      Swallow Study   General Date of Onset: 11/04/19 HPI: 64yo male admitted 10//28/21 with dyspnea. 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 CXR = Cardiomegaly with mild pulmonary venous congestion again noted.Similar findings on prior exam. Low lung volumes with mild bibasilar atelectasis. MBS 2007 Type of Study: Bedside Swallow Evaluation Previous Swallow Assessment: MBS 2007 - no results available Diet Prior to this Study: NPO  Temperature Spikes Noted: No Respiratory Status: Nasal cannula History of Recent Intubation:  No Behavior/Cognition: Cooperative;Pleasant mood;Alert Oral Cavity Assessment: Within Functional Limits Oral Care Completed by SLP: No Oral Cavity - Dentition: Dentures, top;Dentures, bottom Vision: Functional for self-feeding Self-Feeding Abilities: Able to feed self;Needs assist;Needs set up (R hemi) Patient Positioning: Upright in bed Baseline Vocal Quality: Normal Volitional Cough: Weak (apraxic) Volitional Swallow: Able to elicit    Oral/Motor/Sensory Function Overall Oral Motor/Sensory Function: Moderate impairment Facial ROM: Reduced right Facial Symmetry: Abnormal symmetry right Facial Strength: Reduced right Lingual ROM: Within Functional Limits Lingual Symmetry: Within Functional Limits Lingual Strength: Reduced Mandible: Within Functional Limits   Ice Chips Ice chips: Within functional limits Presentation: Spoon   Thin Liquid Thin Liquid: Within functional limits Presentation: Straw    Puree Puree: Within functional limits Presentation: Self Fed;Spoon   Solid     Solid: Within functional limits Presentation: Self Fed     Emary Zalar B. Murvin Natal, [facility], CCC-SLP Speech Language Pathologist Office: [phone] Pager: [phone]  Leigh Aurora 11/05/2019,10:43 AM

## 2019-11-05 NOTE — Progress Notes (Signed)
Bladder scanned patient secondary to urinary retention and abdominal distention. Bladder scan showed >400. Rescanned reflected 526 +. Patient unable to void. Per orders- performed In/Out catheter x 2. Very painful for patient. Patient yelled out during insertion met resistance. Unsuccessful.. Made RR nurse aware and additional recommendation. Made on call M. Sharlet Salina NP aware. Awaiting orders.

## 2019-11-06 DIAGNOSIS — I5023 Acute on chronic systolic (congestive) heart failure: Secondary | ICD-10-CM | POA: Diagnosis not present

## 2019-11-06 LAB — CBC WITH DIFFERENTIAL/PLATELET
Abs Immature Granulocytes: 0.02 10*3/uL (ref 0.00–0.07)
Basophils Absolute: 0 10*3/uL (ref 0.0–0.1)
Basophils Relative: 1 %
Eosinophils Absolute: 0.2 10*3/uL (ref 0.0–0.5)
Eosinophils Relative: 3 %
HCT: 36.9 % — ABNORMAL LOW (ref 39.0–52.0)
Hemoglobin: 12.4 g/dL — ABNORMAL LOW (ref 13.0–17.0)
Immature Granulocytes: 0 %
Lymphocytes Relative: 11 %
Lymphs Abs: 0.7 10*3/uL (ref 0.7–4.0)
MCH: 31.4 pg (ref 26.0–34.0)
MCHC: 33.6 g/dL (ref 30.0–36.0)
MCV: 93.4 fL (ref 80.0–100.0)
Monocytes Absolute: 0.5 10*3/uL (ref 0.1–1.0)
Monocytes Relative: 8 %
Neutro Abs: 4.7 10*3/uL (ref 1.7–7.7)
Neutrophils Relative %: 77 %
Platelets: 213 10*3/uL (ref 150–400)
RBC: 3.95 MIL/uL — ABNORMAL LOW (ref 4.22–5.81)
RDW: 15.7 % — ABNORMAL HIGH (ref 11.5–15.5)
WBC: 6.1 10*3/uL (ref 4.0–10.5)
nRBC: 0 % (ref 0.0–0.2)

## 2019-11-06 LAB — BASIC METABOLIC PANEL
Anion gap: 11 (ref 5–15)
BUN: 20 mg/dL (ref 8–23)
CO2: 20 mmol/L — ABNORMAL LOW (ref 22–32)
Calcium: 9.2 mg/dL (ref 8.9–10.3)
Chloride: 95 mmol/L — ABNORMAL LOW (ref 98–111)
Creatinine, Ser: 1.1 mg/dL (ref 0.61–1.24)
GFR, Estimated: >60 mL/min (ref >60)
Glucose, Bld: 95 mg/dL (ref 70–99)
Potassium: 5.2 mmol/L — ABNORMAL HIGH (ref 3.5–5.1)
Sodium: 126 mmol/L — ABNORMAL LOW (ref 135–145)

## 2019-11-06 LAB — GLUCOSE, CAPILLARY
Glucose-Capillary: 82 mg/dL (ref 70–99)
Glucose-Capillary: 135 mg/dL — ABNORMAL HIGH (ref 70–99)
Glucose-Capillary: 119 mg/dL — ABNORMAL HIGH (ref 70–99)
Glucose-Capillary: 124 mg/dL — ABNORMAL HIGH (ref 70–99)
Glucose-Capillary: 129 mg/dL — ABNORMAL HIGH (ref 70–99)

## 2019-11-06 LAB — HEMOGLOBIN A1C
Hgb A1c MFr Bld: 5.4 % (ref 4.8–5.6)
Mean Plasma Glucose: 108 mg/dL

## 2019-11-06 LAB — MAGNESIUM: Magnesium: 2.3 mg/dL (ref 1.7–2.4)

## 2019-11-06 NOTE — Progress Notes (Signed)
Tele communicated abnormal heart rhythm (wide QRS and run of BBB), Md notified and came to assess the patient, noted. Will continue to monitor the pt and notify MD if thie event continues, noted.

## 2019-11-06 NOTE — Progress Notes (Signed)
Progress Note  Patient Name: [name] Date of Encounter: 11/06/2019  [facility] Cardiologist: Verne Carrow, MD   Subjective   Remains in atrial flutter with CVR and occasional aberration  Inpatient Medications    Scheduled Meds: . apixaban  5 mg Oral BID  . chlorhexidine  15 mL Mouth Rinse BID  . Chlorhexidine Gluconate Cloth  6 each Topical Daily  . insulin aspart  0-9 Units Subcutaneous TID WC  . lidocaine  1 application Urethral Once  . mouth rinse  15 mL Mouth Rinse q12n4p  . simvastatin  40 mg Oral Daily   Continuous Infusions:  PRN Meds: acetaminophen **OR** acetaminophen, lidocaine, loratadine, ondansetron **OR** ondansetron (ZOFRAN) IV, oxyCODONE   Vital Signs    Vitals:   11/06/19 0400 11/06/19 0436 11/06/19 0500 11/06/19 0600  BP: 93/82  109/72 98/75  Pulse: (!) 53  (!) 40 60  Resp: 18  18 17   Temp:  97.7 F (36.5 C)    TempSrc:  Oral    SpO2: 100%  100% 100%  Weight:   74.7 kg   Height:        Intake/Output Summary (Last 24 hours) at 11/06/2019 0851 Last data filed at 11/06/2019 0437 Gross per 24 hour  Intake 1100 ml  Output 950 ml  Net 150 ml   Last 3 Weights 11/06/2019 11/05/2019 11/05/2019  Weight (lbs) 164 lb 10.9 oz 154 lb 8.7 oz 153 lb 14.1 oz  Weight (kg) 74.7 kg 70.1 kg 69.8 kg      Telemetry    Atrial flutter with SVR and occasional aberration vs NSVT - Personally Reviewed  ECG    Atrial flutter with SVR and nonspecific T wave abnormality - Personally Reviewed  Physical Exam   GEN: No acute distress.   Neck: No JVD Cardiac: irregularly irregular and brady, no murmurs, rubs, or gallops.  Respiratory: Clear to auscultation bilaterally. GI: Soft, nontender, non-distended  MS: No edema; No deformity. Neuro:  Nonfocal  Psych: Normal affect   Labs    High Sensitivity Troponin:   Recent Labs  Lab 11/04/19 1818 11/04/19 2142  TROPONINIHS 26* 24*      Chemistry Recent Labs  Lab 11/04/19 1818  11/05/19 0422 11/06/19 0526  NA 124* 126* 126*  K 5.6* 5.0 5.2*  CL 92* 91* 95*  CO2 21* 19* 20*  GLUCOSE 63* 61* 95  BUN 24* 25* 20  CREATININE 1.22 1.23 1.10  CALCIUM 9.6 9.5 9.2  PROT 8.4*  --   --   ALBUMIN 4.3  --   --   AST 35  --   --   ALT 17  --   --   ALKPHOS 206*  --   --   BILITOT 1.8*  --   --   GFRNONAA >60 >60 >60  ANIONGAP 11 16* 11     Hematology Recent Labs  Lab 11/04/19 1818 11/05/19 0422 11/06/19 0526  WBC 5.6 5.7 6.1  RBC 4.16* 3.83* 3.95*  HGB 13.4 12.3* 12.4*  HCT 38.7* 35.6* 36.9*  MCV 93.0 93.0 93.4  MCH 32.2 32.1 31.4  MCHC 34.6 34.6 33.6  RDW 15.8* 15.9* 15.7*  PLT 216 211 213    BNP Recent Labs  Lab 11/04/19 1818  BNP 1,651.0*     DDimer No results for input(s): DDIMER in the last 168 hours.        Radiology    CT ABDOMEN PELVIS WO CONTRAST  Result Date: 11/05/2019 CLINICAL DATA:  Abdominal distension  with urinary retention. EXAM: CT ABDOMEN AND PELVIS WITHOUT CONTRAST TECHNIQUE: Multidetector CT imaging of the abdomen and pelvis was performed following the standard protocol without IV contrast. COMPARISON:  07/13/2019 FINDINGS: Lower chest:  Small right pleural effusion.  Cardiomegaly. Hepatobiliary: No focal liver abnormality.No evidence of biliary obstruction or stone. Pancreas: Unremarkable. Spleen: Unremarkable. Adrenals/Urinary Tract: Negative adrenals. No hydronephrosis or stone. No over distension of the bladder. Small volume bladder gas likely related to prior manipulation. Stomach/Bowel:  No obstruction. No visible bowel inflammation Vascular/Lymphatic: No acute vascular abnormality. Atheromatous calcification. No mass or adenopathy. Reproductive:Negative Other: Large volume ascites which has increased from prior and has progressed. Musculoskeletal: No acute abnormalities. IMPRESSION: 1. Large volume ascites, increased from July 2021. 2. History of urinary retention but the bladder is not over distended currently. 3. Small  right pleural effusion. Electronically Signed   By: Marnee Spring M.D.   On: 11/05/2019 06:48   DG Chest 2 View  Result Date: 11/04/2019 CLINICAL DATA:  Shortness of breath EXAM: CHEST - 2 VIEW COMPARISON:  08/06/2012, 07/12/2019 CT 07/13/2019 FINDINGS: Moderate cardiomegaly. Possible trace pleural effusion. No consolidation or pneumothorax. IMPRESSION: Cardiomegaly with possible trace pleural effusions. Electronically Signed   By: Jasmine Pang M.D.   On: 11/04/2019 19:05   DG Abd 1 View  Result Date: 11/05/2019 CLINICAL DATA:  Abdominal distention and pain. EXAM: ABDOMEN - 1 VIEW COMPARISON:  CT 07/13/2019.  Abdomen 12/02/2005. FINDINGS: Overall increased density of the abdomen noted. Ascites cannot be excluded. Slightly prominent air-filled loops of small and large bowel noted. Mild adynamic ileus may be present. Follow-up exam suggested demonstrate resolution and to exclude developing bowel obstruction. No free air. No acute bony abnormality. Degenerative changes lumbar spine. IMPRESSION: 1. Overall increased density of the abdomen. Ascites cannot be excluded. 2. Slightly prominent air-filled loops of small and large bowel noted. Mild adynamic ileus may be present. Follow-up exam suggested demonstrate resolution and to exclude developing bowel obstruction. Electronically Signed   By: Maisie Fus  Register   On: 11/05/2019 05:12   DG Chest Port 1 View  Result Date: 11/05/2019 CLINICAL DATA:  CHF. EXAM: PORTABLE CHEST 1 VIEW COMPARISON:  11/04/2019. FINDINGS: Cardiomegaly with mild pulmonary venous congestion again noted. Similar findings on prior exam. Low lung volumes with mild bibasilar atelectasis. No pleural effusion or pneumothorax. No acute bony abnormality identified. IMPRESSION: 1. Cardiomegaly with mild pulmonary venous congestion again noted. Similar findings on prior exam. 2. Low lung volumes with mild bibasilar atelectasis. Electronically Signed   By: Maisie Fus  Register   On: 11/05/2019  05:34   ECHOCARDIOGRAM COMPLETE  Result Date: 11/05/2019    ECHOCARDIOGRAM REPORT   Patient Name:   [name] Date of Exam: 11/05/2019 Medical Rec #:  [id number]       Height:       67.0 in Accession #:    [id number]      Weight:       154.5 lb Date of Birth:  1955/07/06      BSA:          1.812 m Patient Age:    64 years        BP:           141/75 mmHg Patient Gender: M               HR:           68 bpm. Exam Location:  Inpatient Procedure: 2D Echo, Cardiac Doppler and Color Doppler Indications:    CHF  History:        Patient has prior history of Echocardiogram examinations, most                 recent 05/07/2019. CHF and Cardiomyopathy, Stroke,                 Arrythmias:Atrial Fibrillation, Signs/Symptoms:Dyspnea and                 Pleural effusion; Risk Factors:Hypertension and Diabetes.  Sonographer:    Lavenia Atlas Referring Phys: [id number] ASIA B ZIERLE-GHOSH  Sonographer Comments: Patient very confused. IMPRESSIONS  1. Left ventricular ejection fraction, by estimation, is 15-20%. The left ventricle has severely decreased function. The left ventricle demonstrates global hypokinesis. Left ventricular diastolic function could not be evaluated. Elevated left atrial pressure. There is the interventricular septum is flattened in diastole ('D' shaped left ventricle), consistent with right ventricular volume overload.  2. Right ventricular systolic function is severely reduced. The right ventricular size is severely enlarged. There is mildly elevated pulmonary artery systolic pressure. The estimated right ventricular systolic pressure is 39.8 mmHg.  3. Left atrial size was severely dilated.  4. Right atrial size was severely dilated.  5. The mitral valve is grossly normal. Mild mitral valve regurgitation. No evidence of mitral stenosis.  6. The tricuspid valve is abnormal. Tricuspid valve regurgitation is severe.  7. The aortic valve is tricuspid. Aortic valve regurgitation is not visualized. No  aortic stenosis is present.  8. The inferior vena cava is dilated in size with <50% respiratory variability, suggesting right atrial pressure of 15 mmHg. Comparison(s): Changes from prior study are noted. LVEF remains ~20% with global HK. RV severely dilated with severely reduced function. TR is now severe. FINDINGS  Left Ventricle: Left ventricular ejection fraction, by estimation, is 15-20%. The left ventricle has severely decreased function. The left ventricle demonstrates global hypokinesis. The left ventricular internal cavity size was normal in size. There is no left ventricular hypertrophy. The interventricular septum is flattened in diastole ('D' shaped left ventricle), consistent with right ventricular volume overload. Left ventricular diastolic function could not be evaluated due to atrial fibrillation. Left ventricular diastolic function could not be evaluated. Elevated left atrial pressure. Right Ventricle: The right ventricular size is severely enlarged. No increase in right ventricular wall thickness. Right ventricular systolic function is severely reduced. There is mildly elevated pulmonary artery systolic pressure. The tricuspid regurgitant velocity is 2.49 m/s, and with an assumed right atrial pressure of 15 mmHg, the estimated right ventricular systolic pressure is 39.8 mmHg. Left Atrium: Left atrial size was severely dilated. Right Atrium: Right atrial size was severely dilated. Pericardium: Trivial pericardial effusion is present. Mitral Valve: The mitral valve is grossly normal. Mild mitral valve regurgitation. No evidence of mitral valve stenosis. Tricuspid Valve: The tricuspid valve is abnormal. Tricuspid valve regurgitation is severe. No evidence of tricuspid stenosis. Aortic Valve: The aortic valve is tricuspid. Aortic valve regurgitation is not visualized. No aortic stenosis is present. Pulmonic Valve: The pulmonic valve was grossly normal. Pulmonic valve regurgitation is mild. No evidence  of pulmonic stenosis. Aorta: The aortic root is normal in size and structure. Venous: The inferior vena cava is dilated in size with less than 50% respiratory variability, suggesting right atrial pressure of 15 mmHg. IAS/Shunts: The atrial septum is grossly normal.  LEFT VENTRICLE PLAX 2D LVIDd:         4.80 cm  Diastology LVIDs:         4.00 cm  LV e'  medial:    4.13 cm/s LV PW:         1.10 cm  LV E/e' medial:  12.4 LV IVS:        1.10 cm  LV e' lateral:   8.16 cm/s LVOT diam:     2.00 cm  LV E/e' lateral: 6.3 LV SV:         47 LV SV Index:   26 LVOT Area:     3.14 cm  RIGHT VENTRICLE RV Basal diam:  5.00 cm RV S prime:     5.33 cm/s TAPSE (M-mode): 2.1 cm LEFT ATRIUM              Index       RIGHT ATRIUM           Index LA diam:        5.70 cm  3.15 cm/m  RA Area:     32.00 cm LA Vol (A2C):   111.0 ml 61.25 ml/m RA Volume:   123.00 ml 67.87 ml/m LA Vol (A4C):   94.2 ml  51.98 ml/m LA Biplane Vol: 105.0 ml 57.94 ml/m  AORTIC VALVE LVOT Vmax:   86.80 cm/s LVOT Vmean:  55.500 cm/s LVOT VTI:    0.151 m  AORTA Ao Root diam: 2.70 cm MITRAL VALVE               TRICUSPID VALVE MV Area (PHT): 3.42 cm    TR Peak grad:   24.8 mmHg MV Decel Time: 222 msec    TR Vmax:        249.00 cm/s MV E velocity: 51.20 cm/s MV A velocity: 23.15 cm/s  SHUNTS MV E/A ratio:  2.21        Systemic VTI:  0.15 m                            Systemic Diam: 2.00 cm Lennie Odor MD Electronically signed by Lennie Odor MD Signature Date/Time: 11/05/2019/1:35:08 PM    Final     Cardiac Studies   2D echo IMPRESSIONS    1. Left ventricular ejection fraction, by estimation, is 15-20%. The left  ventricle has severely decreased function. The left ventricle demonstrates  global hypokinesis. Left ventricular diastolic function could not be  evaluated. Elevated left atrial  pressure. There is the interventricular septum is flattened in diastole  ('D' shaped left ventricle), consistent with right ventricular volume  overload.  2.  Right ventricular systolic function is severely reduced. The right  ventricular size is severely enlarged. There is mildly elevated pulmonary  artery systolic pressure. The estimated right ventricular systolic  pressure is 39.8 mmHg.  3. Left atrial size was severely dilated.  4. Right atrial size was severely dilated.  5. The mitral valve is grossly normal. Mild mitral valve regurgitation.  No evidence of mitral stenosis.  6. The tricuspid valve is abnormal. Tricuspid valve regurgitation is  severe.  7. The aortic valve is tricuspid. Aortic valve regurgitation is not  visualized. No aortic stenosis is present.  8. The inferior vena cava is dilated in size with <50% respiratory  variability, suggesting right atrial pressure of 15 mmHg.   Comparison(s): Changes from prior study are noted. LVEF remains ~20% with  global HK. RV severely dilated with severely reduced function. TR is now  severe.   Cardiac Cath 08/2016 Conclusion    Ost LAD lesion, 20 %stenosed.  Prox LAD lesion, 35 %stenosed.  Suezanne Jacquet  1st Diag lesion, 40 %stenosed.  Ost 2nd Diag to 2nd Diag lesion, 20 %stenosed.   Normal to very minimal elevation of right heart pressures.  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  RECOMMENDATION: Guideline directed medical therapy for the patient's nonischemic cardiomyopathy   Patient Profile     64 y.o. male with prior stroke in 2010 with expressive aphasia chronic right-sided weakness diabetes hypertension nonischemic cardiomyopathy with chronic systolic heart failure as well as atrial flutter who is being seen for the  evaluation of heart failure at the request of Dr. Carolynn Comment  Assessment & Plan    Chronic systolic heart failure secondary to nonischemic cardiomyopathy/severe biventricular heart failure/cardiogenic shock -EF 15-20 from 25%.   -On low-dose Entresto with Toprol on hold secondary to hypotension as low as systolic in the upper 70s. -Lasix at home was increased sequentially but was not improving.   -Hypotension noted early in this admission especially after 60 mg of IV Lasix was administered.   -At this point, he appears intravascularly dry.  Agreed with primary cc bolus of fluid to replenish.  Blood pressure has responded appropriately.  -Continue to hold Entresto, Lasix and Toprol -serum Na minimally improved with holding diuretics and gentle IVF -TSH normal - BNP at baseline will be elevated with Entresto use however does appear overtly higher on admission, 1651; his edema is most in his abdomen (also confirmed by niece as they say abdomen has swollen tremendously recently); this is poor prognosticator with worsening CHF leading to cardiac ascites  -Given his hyponatremia, hypotension, elevated creatinine these are all poor prognostic indicators for his underlying nonischemic cardiomyopathy.  -Continue to monitor closely. -suggest palliative care consult given the end-stage nature of his underlying condition. 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  -no ASA due to DOAC  Atrial fibrillation/flutter -Permanent with rate control.   -On Eliquis -BB on hold    I have spent a total of 35 minutes with patient reviewing cardiac cath, 2D echo , telemetry, EKGs, labs and examining patient as well as establishing an assessment and plan that was discussed with the patient.  > 50% of time was spent in direct patient care.    For questions or updates, please contact [facility] Please consult [email or web address] for contact info under        Signed, Armanda Magic, MD  11/06/2019, 8:51 AM

## 2019-11-06 NOTE — Progress Notes (Signed)
PROGRESS NOTE    [name]   ZOX:[id number]  DOB: [birth year]  DOA: 11/04/2019     1  PCP: Mirna Mires, MD  CC: Shortness of breath and abdominal swelling  Hospital Course: [name] is a 64 y.o. 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 Fib/flutter who presented to the ED with a chief complaint of dyspnea.   He has become progressively more dyspneic at home especially with any movement.  He had been ambulating with a cane and started developing worsening distention/swelling in his abdomen. His Lasix dosing was increased outpatient per his cardiologist with minimal improvement. Due to his progressively worsening dyspnea and abdominal swelling, he was brought to the ER for further evaluation.  On work-up he was found to be hypothermic, hypoglycemic, hyponatremic, bradycardic, hypotensive, slightly tachypneic. He had elevated BNP, indeterminate troponins.  He was admitted for further work-up and treatment.  He was initially started on Lasix for diuresis notably of his abdominal ascites.  He became more hypotensive and bradycardic requiring discontinuation of Lasix.  He was given a 500 cc normal saline bolus on 11/05/2019 which stabilized blood pressure.  Cardiology was consulted for further evaluation given concern for development of cardiogenic shock.  Vitals remained stable but borderline.  He was recommended for transfer to [facility] for evaluation by the advanced heart failure team.  He was not started on inotropic therapy prior to transfer. Medications held include Entresto, metoprolol, Lasix.    Interval History:  No events overnight.  He remains bradycardic and borderline hypotensive but asymptomatic this morning.  Nods his head no when asked if having any shortness of breath or chest pain.  His abdomen remains  distended.  Foley bag noted with dark urine.  He did put out a little more urine yesterday after the saline bolus. Cardiology has recommended transfer to [facility] for evaluation by the advanced heart failure service. I called and updated his niece, [name].  She is aware and understands of transfer.  Family has also convened yesterday and given the abrupt decline, they do wish to pursue treatment options if able however do understand that prognosis remains guarded at this time.  Old records reviewed in assessment of this patient  ROS: Review of systems not obtained due to patient factors.  Patient is nonverbal and unable to communicate effectively.  Mostly points at things.  Nods head no to chest pain or shortness of breath  Assessment & Plan: * Acute on chronic systolic CHF (congestive heart failure) (HCC) - echo in May 2021 shows EF 20-25%. Repeat echo now shows EF 15-20% - albumin surprisingly not low; 4.3 - BNP at baseline will be elevated with Entresto use however does appear overtly higher on admission, 1651; his edema is most in his abdomen (also confirmed by niece as they say abdomen has swollen tremendously recently); this is poor prognosticator with worsening CHF leading to cardiac ascites  - patient initially started on IV lasix however on 10/29 became bradycardic and hypotensive (he did have a BB at home PTA) - s/p 500 cc NS bolus during rapid response 10/29; BP stable for now - hold off on further lasix; may possibly need repeat small bolus PRN for now - see cardiogenic shock  Cardiogenic shock (HCC) - greatly appreciate cardiology evaluation; I agree with cardiogenic shock and guarded prognosis - he has developed cardiac ascites rapidly over the past  couple weeks per niece, hyponatremia, hypotension, hypothermia, and some decrease in UOP yesterday, but seems to have picked up some for now - I've had some GOC discussions with niece Engineer, mining) over the past 2 days - given rapid decline,  cardiology has discussed case and patient is recommended for transfer to [facility] for evaluation by the AHF team; patient follows with Dr. [name] outpatient    Hypothermia - see cardiogenic shock   Hyponatremia - worsening Na since July - patient cannot tolerate further diuresis at this point; volume is in abdomen not peripherally and albumin normal - monitor for now - Na improved some after 500 cc NS bolus on 10/29; no significant SOB or change in respi status  Persistent atrial fibrillation (HCC) - currently bradycardic and hypotensive - hold BB - continue Eliquis for now   Type 2 diabetes mellitus without complication, without long-term current use of insulin (HCC) - hypoglycemic on admission; possibly some contribution from sulfonylurea -Continue hypoglycemic protocol - SSI in case elevates   History of stroke - residual severe expressive aphasia (very hard to know what patient is wanting and mostly points to things), also has right side paralysis  HLD (hyperlipidemia) - patient not likely to benefit from any further statin use for 10-year mortality    Antimicrobials: None  DVT prophylaxis: Eliquis Code Status: Full Family Communication: niece, [name] on phone Disposition Plan: Status is: Inpatient  Remains inpatient appropriate because:Hemodynamically unstable, Ongoing diagnostic testing needed not appropriate for outpatient work up, Unsafe d/c plan, IV treatments appropriate due to intensity of illness or inability to take PO and Inpatient level of care appropriate due to severity of illness   Dispo: The patient is from: Home              Anticipated d/c is to: Pending clinical course, guarded prognosis              Anticipated d/c date is: > 3 days              Patient currently is not medically stable to d/c.  Objective: Blood pressure 98/75, pulse 60, temperature 97.7 F (36.5 C), temperature source Oral, resp. rate 17, height  (1.702 m), weight 74.7 kg, SpO2  100 %.  Examination: General appearance: Awake and alert resting in bed seen this morning in no distress with obvious distended abdomen Head: Normocephalic, without obvious abnormality, atraumatic Eyes: EOMI Lungs: Mostly clear breath sounds bilaterally Heart: Bradycardic, irregularly irregular rhythm Abdomen: Severely distended. Nontender Extremities: Trace lower extremity edema Skin: mobility and turgor normal Neurologic: Understands commands and follows them but does not speak much  Consultants:   Cardiology   Procedures:   none  Data Reviewed: I have personally reviewed following labs and imaging studies Results for orders placed or performed during the hospital encounter of 11/04/19 (from the past 24 hour(s))  Glucose, capillary     Status: None   Collection Time: 11/05/19  5:06 PM  Result Value Ref Range   Glucose-Capillary 95 70 - 99 mg/dL  Glucose, capillary     Status: Abnormal   Collection Time: 11/05/19  9:49 PM  Result Value Ref Range   Glucose-Capillary 110 (H) 70 - 99 mg/dL  Basic metabolic panel     Status: Abnormal   Collection Time: 11/06/19  5:26 AM  Result Value Ref Range   Sodium 126 (L) 135 - 145 mmol/L   Potassium 5.2 (H) 3.5 - 5.1 mmol/L   Chloride 95 (L) 98 - 111 mmol/L  CO2 20 (L) 22 - 32 mmol/L   Glucose, Bld 95 70 - 99 mg/dL   BUN 20 8 - 23 mg/dL   Creatinine, Ser 6.29 0.61 - 1.24 mg/dL   Calcium 9.2 8.9 - 52.8 mg/dL   GFR, Estimated >41 >32 mL/min   Anion gap 11 5 - 15  CBC with Differential/Platelet     Status: Abnormal   Collection Time: 11/06/19  5:26 AM  Result Value Ref Range   WBC 6.1 4.0 - 10.5 K/uL   RBC 3.95 (L) 4.22 - 5.81 MIL/uL   Hemoglobin 12.4 (L) 13.0 - 17.0 g/dL   HCT 44.0 (L) 39 - 52 %   MCV 93.4 80.0 - 100.0 fL   MCH 31.4 26.0 - 34.0 pg   MCHC 33.6 30.0 - 36.0 g/dL   RDW 10.2 (H) 72.5 - 36.6 %   Platelets 213 150 - 400 K/uL   nRBC 0.0 0.0 - 0.2 %   Neutrophils Relative % 77 %   Neutro Abs 4.7 1.7 - 7.7 K/uL    Lymphocytes Relative 11 %   Lymphs Abs 0.7 0.7 - 4.0 K/uL   Monocytes Relative 8 %   Monocytes Absolute 0.5 0.1 - 1.0 K/uL   Eosinophils Relative 3 %   Eosinophils Absolute 0.2 0.0 - 0.5 K/uL   Basophils Relative 1 %   Basophils Absolute 0.0 0.0 - 0.1 K/uL   Immature Granulocytes 0 %   Abs Immature Granulocytes 0.02 0.00 - 0.07 K/uL  Magnesium     Status: None   Collection Time: 11/06/19  5:26 AM  Result Value Ref Range   Magnesium 2.3 1.7 - 2.4 mg/dL  Glucose, capillary     Status: None   Collection Time: 11/06/19  7:30 AM  Result Value Ref Range   Glucose-Capillary 82 70 - 99 mg/dL  Glucose, capillary     Status: Abnormal   Collection Time: 11/06/19 12:43 PM  Result Value Ref Range   Glucose-Capillary 135 (H) 70 - 99 mg/dL    Recent Results (from the past 240 hour(s))  Respiratory Panel by RT PCR (Flu A&B, Covid) - Nasopharyngeal Swab     Status: None   Collection Time: 11/04/19  6:21 PM   Specimen: Nasopharyngeal Swab  Result Value Ref Range Status   SARS Coronavirus 2 by RT PCR NEGATIVE NEGATIVE Final    Comment: (NOTE) SARS-CoV-2 target nucleic acids are NOT DETECTED.  The SARS-CoV-2 RNA is generally detectable in upper respiratoy specimens during the acute phase of infection. The lowest concentration of SARS-CoV-2 viral copies this assay can detect is 131 copies/mL. A negative result does not preclude SARS-Cov-2 infection and should not be used as the sole basis for treatment or other patient management decisions. A negative result may occur with  improper specimen collection/handling, submission of specimen other than nasopharyngeal swab, presence of viral mutation(s) within the areas targeted by this assay, and inadequate number of viral copies (<131 copies/mL). A negative result must be combined with clinical observations, patient history, and epidemiological information. The expected result is Negative.  Fact Sheet for Patients:   https://www.moore.com/  Fact Sheet for Healthcare Providers:  https://www.young.biz/  This test is no t yet approved or cleared by the [place] FDA and  has been authorized for detection and/or diagnosis of SARS-CoV-2 by FDA under an Emergency Use Authorization (EUA). This EUA will remain  in effect (meaning this test can be used) for the duration of the COVID-19 declaration under Section 564(b)(1) of the  Act, 21 U.S.C. section 360bbb-3(b)(1), unless the authorization is terminated or revoked sooner.     Influenza A by PCR NEGATIVE NEGATIVE Final   Influenza B by PCR NEGATIVE NEGATIVE Final    Comment: (NOTE) The Xpert Xpress SARS-CoV-2/FLU/RSV assay is intended as an aid in  the diagnosis of influenza from Nasopharyngeal swab specimens and  should not be used as a sole basis for treatment. Nasal washings and  aspirates are unacceptable for Xpert Xpress SARS-CoV-2/FLU/RSV  testing.  Fact Sheet for Patients: https://www.moore.com/  Fact Sheet for Healthcare Providers: https://www.young.biz/  This test is not yet approved or cleared by the [place] FDA and  has been authorized for detection and/or diagnosis of SARS-CoV-2 by  FDA under an Emergency Use Authorization (EUA). This EUA will remain  in effect (meaning this test can be used) for the duration of the  Covid-19 declaration under Section 564(b)(1) of the Act, 21  U.S.C. section 360bbb-3(b)(1), unless the authorization is  terminated or revoked. Performed at [facility], [address], [place], [place] [address]   Group A Strep by PCR     Status: None   Collection Time: 11/04/19  6:45 PM   Specimen: Throat; Sterile Swab  Result Value Ref Range Status   Group A Strep by PCR NOT DETECTED NOT DETECTED Final    Comment: Performed at [facility], [address], [place], [place] [address]      Radiology Studies: CT ABDOMEN PELVIS WO CONTRAST  Result Date: 11/05/2019 CLINICAL DATA:  Abdominal distension with urinary retention. EXAM: CT ABDOMEN AND PELVIS WITHOUT CONTRAST TECHNIQUE: Multidetector CT imaging of the abdomen and pelvis was performed following the standard protocol without IV contrast. COMPARISON:  07/13/2019 FINDINGS: Lower chest:  Small right pleural effusion.  Cardiomegaly. Hepatobiliary: No focal liver abnormality.No evidence of biliary obstruction or stone. Pancreas: Unremarkable. Spleen: Unremarkable. Adrenals/Urinary Tract: Negative adrenals. No hydronephrosis or stone. No over distension of the bladder. Small volume bladder gas likely related to prior manipulation. Stomach/Bowel:  No obstruction. No visible bowel inflammation Vascular/Lymphatic: No acute vascular abnormality. Atheromatous calcification. No mass or adenopathy. Reproductive:Negative Other: Large volume ascites which has increased from prior and has progressed. Musculoskeletal: No acute abnormalities. IMPRESSION: 1. Large volume ascites, increased from July 2021. 2. History of urinary retention but the bladder is not over distended currently. 3. Small right pleural effusion. Electronically Signed   By: Marnee Spring M.D.   On: 11/05/2019 06:48   DG Chest 2 View  Result Date: 11/04/2019 CLINICAL DATA:  Shortness of breath EXAM: CHEST - 2 VIEW COMPARISON:  08/06/2012, 07/12/2019 CT 07/13/2019 FINDINGS: Moderate cardiomegaly. Possible trace pleural effusion. No consolidation or pneumothorax. IMPRESSION: Cardiomegaly with possible trace pleural effusions. Electronically Signed   By: Jasmine Pang M.D.   On: 11/04/2019 19:05   DG Abd 1 View  Result Date: 11/05/2019 CLINICAL DATA:  Abdominal distention and pain. EXAM: ABDOMEN - 1 VIEW COMPARISON:  CT 07/13/2019.  Abdomen 12/02/2005. FINDINGS: Overall increased density of the abdomen noted. Ascites cannot be excluded. Slightly prominent air-filled loops of  small and large bowel noted. Mild adynamic ileus may be present. Follow-up exam suggested demonstrate resolution and to exclude developing bowel obstruction. No free air. No acute bony abnormality. Degenerative changes lumbar spine. IMPRESSION: 1. Overall increased density of the abdomen. Ascites cannot be excluded. 2. Slightly prominent air-filled loops of small and large bowel noted. Mild adynamic ileus may be present. Follow-up exam suggested demonstrate resolution and to exclude developing bowel  obstruction. Electronically Signed   By: Maisie Fus  Register   On: 11/05/2019 05:12   DG Chest Port 1 View  Result Date: 11/05/2019 CLINICAL DATA:  CHF. EXAM: PORTABLE CHEST 1 VIEW COMPARISON:  11/04/2019. FINDINGS: Cardiomegaly with mild pulmonary venous congestion again noted. Similar findings on prior exam. Low lung volumes with mild bibasilar atelectasis. No pleural effusion or pneumothorax. No acute bony abnormality identified. IMPRESSION: 1. Cardiomegaly with mild pulmonary venous congestion again noted. Similar findings on prior exam. 2. Low lung volumes with mild bibasilar atelectasis. Electronically Signed   By: Maisie Fus  Register   On: 11/05/2019 05:34   ECHOCARDIOGRAM COMPLETE  Result Date: 11/05/2019    ECHOCARDIOGRAM REPORT   Patient Name:   [name] Date of Exam: 11/05/2019 Medical Rec #:  [id number]       Height:       67.0 in Accession #:    [id number]      Weight:       154.5 lb Date of Birth:  10/03/1955      BSA:          1.812 m Patient Age:    64 years        BP:           141/75 mmHg Patient Gender: M               HR:           68 bpm. Exam Location:  Inpatient Procedure: 2D Echo, Cardiac Doppler and Color Doppler Indications:    CHF  History:        Patient has prior history of Echocardiogram examinations, most                 recent 05/07/2019. CHF and Cardiomyopathy, Stroke,                 Arrythmias:Atrial Fibrillation, Signs/Symptoms:Dyspnea and                 Pleural effusion;  Risk Factors:Hypertension and Diabetes.  Sonographer:    Lavenia Atlas Referring Phys: [id number] ASIA B ZIERLE-GHOSH  Sonographer Comments: Patient very confused. IMPRESSIONS  1. Left ventricular ejection fraction, by estimation, is 15-20%. The left ventricle has severely decreased function. The left ventricle demonstrates global hypokinesis. Left ventricular diastolic function could not be evaluated. Elevated left atrial pressure. There is the interventricular septum is flattened in diastole ('D' shaped left ventricle), consistent with right ventricular volume overload.  2. Right ventricular systolic function is severely reduced. The right ventricular size is severely enlarged. There is mildly elevated pulmonary artery systolic pressure. The estimated right ventricular systolic pressure is 39.8 mmHg.  3. Left atrial size was severely dilated.  4. Right atrial size was severely dilated.  5. The mitral valve is grossly normal. Mild mitral valve regurgitation. No evidence of mitral stenosis.  6. The tricuspid valve is abnormal. Tricuspid valve regurgitation is severe.  7. The aortic valve is tricuspid. Aortic valve regurgitation is not visualized. No aortic stenosis is present.  8. The inferior vena cava is dilated in size with <50% respiratory variability, suggesting right atrial pressure of 15 mmHg. Comparison(s): Changes from prior study are noted. LVEF remains ~20% with global HK. RV severely dilated with severely reduced function. TR is now severe. FINDINGS  Left Ventricle: Left ventricular ejection fraction, by estimation, is 15-20%. The left ventricle has severely decreased function. The left ventricle demonstrates global hypokinesis. The left ventricular internal cavity size was normal in size. There is no  left ventricular hypertrophy. The interventricular septum is flattened in diastole ('D' shaped left ventricle), consistent with right ventricular volume overload. Left ventricular diastolic function could  not be evaluated due to atrial fibrillation. Left ventricular diastolic function could not be evaluated. Elevated left atrial pressure. Right Ventricle: The right ventricular size is severely enlarged. No increase in right ventricular wall thickness. Right ventricular systolic function is severely reduced. There is mildly elevated pulmonary artery systolic pressure. The tricuspid regurgitant velocity is 2.49 m/s, and with an assumed right atrial pressure of 15 mmHg, the estimated right ventricular systolic pressure is 39.8 mmHg. Left Atrium: Left atrial size was severely dilated. Right Atrium: Right atrial size was severely dilated. Pericardium: Trivial pericardial effusion is present. Mitral Valve: The mitral valve is grossly normal. Mild mitral valve regurgitation. No evidence of mitral valve stenosis. Tricuspid Valve: The tricuspid valve is abnormal. Tricuspid valve regurgitation is severe. No evidence of tricuspid stenosis. Aortic Valve: The aortic valve is tricuspid. Aortic valve regurgitation is not visualized. No aortic stenosis is present. Pulmonic Valve: The pulmonic valve was grossly normal. Pulmonic valve regurgitation is mild. No evidence of pulmonic stenosis. Aorta: The aortic root is normal in size and structure. Venous: The inferior vena cava is dilated in size with less than 50% respiratory variability, suggesting right atrial pressure of 15 mmHg. IAS/Shunts: The atrial septum is grossly normal.  LEFT VENTRICLE PLAX 2D LVIDd:         4.80 cm  Diastology LVIDs:         4.00 cm  LV e' medial:    4.13 cm/s LV PW:         1.10 cm  LV E/e' medial:  12.4 LV IVS:        1.10 cm  LV e' lateral:   8.16 cm/s LVOT diam:     2.00 cm  LV E/e' lateral: 6.3 LV SV:         47 LV SV Index:   26 LVOT Area:     3.14 cm  RIGHT VENTRICLE RV Basal diam:  5.00 cm RV S prime:     5.33 cm/s TAPSE (M-mode): 2.1 cm LEFT ATRIUM              Index       RIGHT ATRIUM           Index LA diam:        5.70 cm  3.15 cm/m  RA Area:      32.00 cm LA Vol (A2C):   111.0 ml 61.25 ml/m RA Volume:   123.00 ml 67.87 ml/m LA Vol (A4C):   94.2 ml  51.98 ml/m LA Biplane Vol: 105.0 ml 57.94 ml/m  AORTIC VALVE LVOT Vmax:   86.80 cm/s LVOT Vmean:  55.500 cm/s LVOT VTI:    0.151 m  AORTA Ao Root diam: 2.70 cm MITRAL VALVE               TRICUSPID VALVE MV Area (PHT): 3.42 cm    TR Peak grad:   24.8 mmHg MV Decel Time: 222 msec    TR Vmax:        249.00 cm/s MV E velocity: 51.20 cm/s MV A velocity: 23.15 cm/s  SHUNTS MV E/A ratio:  2.21        Systemic VTI:  0.15 m                            Systemic Diam:  2.00 cm Lennie Odor MD Electronically signed by Lennie Odor MD Signature Date/Time: 11/05/2019/1:35:08 PM    Final    CT ABDOMEN PELVIS WO CONTRAST  Final Result    DG Chest Port 1 View  Final Result    DG Abd 1 View  Final Result    DG Chest 2 View  Final Result      Scheduled Meds: . apixaban  5 mg Oral BID  . chlorhexidine  15 mL Mouth Rinse BID  . Chlorhexidine Gluconate Cloth  6 each Topical Daily  . insulin aspart  0-9 Units Subcutaneous TID WC  . lidocaine  1 application Urethral Once  . mouth rinse  15 mL Mouth Rinse q12n4p  . simvastatin  40 mg Oral Daily   PRN Meds: acetaminophen **OR** acetaminophen, lidocaine, loratadine, ondansetron **OR** ondansetron (ZOFRAN) IV, oxyCODONE Continuous Infusions:   LOS: 1 day  Time spent: Greater than 50% of the 35 minute visit was spent in counseling/coordination of care for the patient as laid out in the A&P.   Lewie Chamber, MD [facility] 11/06/2019, 1:43 PM

## 2019-11-06 NOTE — Plan of Care (Signed)
Pt is alert and oriented times one. No c/o pain or discomfort. Took all medications and tolerated well. Pt continued to have low bp and hr, noted. Call bell in reach. Will continue to monitor.  Problem: Education: Goal: Knowledge of General Education information will improve Description: Including pain rating scale, medication(s)/side effects and non-pharmacologic comfort measures Outcome: Progressing   Problem: Health Behavior/Discharge Planning: Goal: Ability to manage health-related needs will improve Outcome: Progressing   Problem: Clinical Measurements: Goal: Ability to maintain clinical measurements within normal limits will improve Outcome: Progressing Goal: Will remain free from infection Outcome: Progressing Goal: Diagnostic test results will improve Outcome: Progressing Goal: Respiratory complications will improve Outcome: Progressing Goal: Cardiovascular complication will be avoided Outcome: Progressing   Problem: Activity: Goal: Risk for activity intolerance will decrease Outcome: Progressing   Problem: Nutrition: Goal: Adequate nutrition will be maintained Outcome: Progressing   Problem: Coping: Goal: Level of anxiety will decrease Outcome: Progressing   Problem: Elimination: Goal: Will not experience complications related to bowel motility Outcome: Progressing Goal: Will not experience complications related to urinary retention Outcome: Progressing   Problem: Pain Managment: Goal: General experience of comfort will improve Outcome: Progressing   Problem: Safety: Goal: Ability to remain free from injury will improve Outcome: Progressing   Problem: Skin Integrity: Goal: Risk for impaired skin integrity will decrease Outcome: Progressing   Problem: Education: Goal: Knowledge of disease or condition will improve Outcome: Progressing   Problem: Education: Goal: Ability to demonstrate management of disease process will improve Outcome:  Progressing Goal: Ability to verbalize understanding of medication therapies will improve Outcome: Progressing Goal: Individualized Educational Video(s) Outcome: Progressing   Problem: Activity: Goal: Capacity to carry out activities will improve Outcome: Progressing   Problem: Cardiac: Goal: Ability to achieve and maintain adequate cardiopulmonary perfusion will improve Outcome: Progressing

## 2019-11-06 NOTE — Progress Notes (Signed)
Pt had a 4 beat run of VT HR in the 50's and 60's through out the night. EKG completed which shows Atrial Flutter with AV Block, pt alert BP's 120/ 80's, made Np Blount awear, no new orders given at this time.

## 2019-11-06 NOTE — Progress Notes (Signed)
Discussed patient with primary Cardiologist, Dr. [name].  Patient has never been evaluated by AHF service and before this admission was completely independent.  He does not speak much due to prior CVA but Dr. [name] agrees that he should be evaluated by AHF service before considering Palliative Care.  Please transfer patient to [facility] under [facility] and will have AHF service see tomorrow.

## 2019-11-06 NOTE — Progress Notes (Signed)
[facility] arrived to unit to take pt to [address] [facility] Gala Murdoch (niece) made aware and given update.. Pt sent with all belongings (socks and pants).

## 2019-11-07 ENCOUNTER — Inpatient Hospital Stay: Payer: Self-pay

## 2019-11-07 ENCOUNTER — Inpatient Hospital Stay (HOSPITAL_COMMUNITY): Payer: Medicaid Other

## 2019-11-07 DIAGNOSIS — Z8673 Personal history of transient ischemic attack (TIA), and cerebral infarction without residual deficits: Secondary | ICD-10-CM

## 2019-11-07 DIAGNOSIS — I4819 Other persistent atrial fibrillation: Secondary | ICD-10-CM

## 2019-11-07 DIAGNOSIS — I5023 Acute on chronic systolic (congestive) heart failure: Secondary | ICD-10-CM | POA: Diagnosis not present

## 2019-11-07 DIAGNOSIS — E871 Hypo-osmolality and hyponatremia: Secondary | ICD-10-CM

## 2019-11-07 LAB — HEPATIC FUNCTION PANEL
ALT: 20 U/L (ref 0–44)
AST: 39 U/L (ref 15–41)
Albumin: 3.2 g/dL — ABNORMAL LOW (ref 3.5–5.0)
Alkaline Phosphatase: 163 U/L — ABNORMAL HIGH (ref 38–126)
Bilirubin, Direct: 0.7 mg/dL — ABNORMAL HIGH (ref 0.0–0.2)
Indirect Bilirubin: 1.3 mg/dL — ABNORMAL HIGH (ref 0.3–0.9)
Total Bilirubin: 2 mg/dL — ABNORMAL HIGH (ref 0.3–1.2)
Total Protein: 6.4 g/dL — ABNORMAL LOW (ref 6.5–8.1)

## 2019-11-07 LAB — COOXEMETRY PANEL
Carboxyhemoglobin: 1.2 % (ref 0.5–1.5)
Methemoglobin: 0.8 % (ref 0.0–1.5)
O2 Saturation: 55.1 %
Total hemoglobin: 12.5 g/dL (ref 12.0–16.0)

## 2019-11-07 LAB — GLUCOSE, CAPILLARY
Glucose-Capillary: 102 mg/dL — ABNORMAL HIGH (ref 70–99)
Glucose-Capillary: 104 mg/dL — ABNORMAL HIGH (ref 70–99)
Glucose-Capillary: 105 mg/dL — ABNORMAL HIGH (ref 70–99)
Glucose-Capillary: 143 mg/dL — ABNORMAL HIGH (ref 70–99)

## 2019-11-07 LAB — SODIUM: Sodium: 134 mmol/L — ABNORMAL LOW (ref 135–145)

## 2019-11-07 MED ORDER — TOLVAPTAN 15 MG PO TABS
15.0000 mg | ORAL_TABLET | Freq: Once | ORAL | Status: AC
  Administered 2019-11-07: 15 mg via ORAL
  Filled 2019-11-07: qty 1

## 2019-11-07 MED ORDER — FUROSEMIDE 10 MG/ML IJ SOLN
80.0000 mg | Freq: Two times a day (BID) | INTRAMUSCULAR | Status: DC
  Administered 2019-11-07 – 2019-11-10 (×6): 80 mg via INTRAVENOUS
  Filled 2019-11-07 (×6): qty 8

## 2019-11-07 MED ORDER — MILRINONE LACTATE IN DEXTROSE 20-5 MG/100ML-% IV SOLN
0.2500 ug/kg/min | INTRAVENOUS | Status: DC
  Administered 2019-11-07 – 2019-11-09 (×4): 0.25 ug/kg/min via INTRAVENOUS
  Filled 2019-11-07 (×4): qty 100

## 2019-11-07 MED ORDER — AMIODARONE HCL 200 MG PO TABS
200.0000 mg | ORAL_TABLET | Freq: Every day | ORAL | Status: DC
  Administered 2019-11-07 – 2019-11-08 (×2): 200 mg via ORAL
  Filled 2019-11-07 (×2): qty 1

## 2019-11-07 MED ORDER — SODIUM CHLORIDE 0.9% FLUSH: 10.0000 mL | INTRAVENOUS | Status: DC | PRN

## 2019-11-07 MED ORDER — MILRINONE LACTATE IN DEXTROSE 20-5 MG/100ML-% IV SOLN: 0.2500 ug/kg/min | INTRAVENOUS | Status: DC

## 2019-11-07 MED ORDER — LIDOCAINE HCL (PF) 1 % IJ SOLN
INTRAMUSCULAR | Status: AC
  Filled 2019-11-07: qty 30

## 2019-11-07 MED ORDER — SIMVASTATIN 20 MG PO TABS
20.0000 mg | ORAL_TABLET | Freq: Every day | ORAL | Status: DC
  Administered 2019-11-08 – 2019-11-11 (×4): 20 mg via ORAL
  Filled 2019-11-07 (×4): qty 1

## 2019-11-07 MED ORDER — SODIUM CHLORIDE 0.9% FLUSH
10.0000 mL | Freq: Two times a day (BID) | INTRAVENOUS | Status: DC
  Administered 2019-11-07: 10 mL
  Administered 2019-11-07: 20 mL
  Administered 2019-11-08: 10 mL
  Administered 2019-11-08: 20 mL
  Administered 2019-11-09 – 2019-11-14 (×7): 10 mL

## 2019-11-07 NOTE — Progress Notes (Signed)
Co-ox 55% will proceed with milrinone.

## 2019-11-07 NOTE — Progress Notes (Signed)
[facility]  PROGRESS NOTE  [name] SHF:[id number] DOB: [birth year] DOA: 11/04/2019 PCP: Mirna Mires, MD   Brief HPI:   64 year old male with a history of stroke with expressive aphasia, right-sided paralysis, nonischemic cardiomyopathy, systolic CHF EF 20 to 25% as of April 2021 echo, hypertension, diabetes mellitus type 2, persistent atrial flutter/atrial fibrillation came to ED with chief complaints of dyspnea.  Patient became progressively more dyspneic at home.  He had been ambulating with cane and started developing worsening distention/swelling in the abdomen.  His Lasix dosing was increased outpatient per his cardiologist with minimal improvement.  Due to progressively worsening dyspnea and abdominal swelling he was brought to the ED for further evaluation. He was found to have elevated BNP, indeterminate troponins.  Initially started on Lasix for diuresis for abdominal ascites.  He became more hypotensive and bradycardic requiring discontinuation of Lasix.  Cardiology was consulted for concern of development cardiogenic shock.  Cardiology recommended transfer to [facility] for evaluation by hospice failure team.  Entresto, metoprolol, Lasix are currently on hold.    Subjective   Patient seen and examined, being bothered by Foley catheter.  Wants Foley catheter to be removed.  Denies shortness of breath.  Currently on 1 L/min of oxygen via nasal cannula.   Assessment/Plan:     1. Acute on chronic systolic CHF-echo in 2021 showed EF 20 to 25%, repeat echo done during this hospitalization showed EF of 15 to 20%.  BNP 1651.  Initially started on Lasix however became bradycardic and hypotensive so Lasix was held.  Likely cardiogenic shock.  Cardiology consulted.  Will follow cardiology recommendations. 2. Cardiogenic shock-patient was started on IV Lasix however became bradycardic and hypotensive, Lasix is currently on hold.  Cardiology consulted as  above. 3. Hyponatremia-secondary likely hypervolemic hyponatremia from underlying CHF.  Need diuresis with inotropic support, will follow serum sodium level.  Management of heart failure as per cardiology. 4. Persistent atrial fibrillation-beta-blockers on hold due to hypotension.  Continue Eliquis. 5. Diabetes mellitus type 2-patient was hypoglycemic on admission, continue hypoglycemic protocol.  Sliding scale insulin with NovoLog. 6. History of stroke-residual severe expressive aphasia, has right-sided paralysis     COVID-19 Labs  No results for input(s): DDIMER, FERRITIN, LDH, CRP in the last 72 hours.  Lab Results  Component Value Date   SARSCOV2NAA NEGATIVE 11/04/2019   SARSCOV2NAA NEGATIVE 07/13/2019     Scheduled medications:   . amiodarone  200 mg Oral Daily  . apixaban  5 mg Oral BID  . chlorhexidine  15 mL Mouth Rinse BID  . Chlorhexidine Gluconate Cloth  6 each Topical Daily  . furosemide  80 mg Intravenous BID  . insulin aspart  0-9 Units Subcutaneous TID WC  . lidocaine (PF)      . lidocaine  1 application Urethral Once  . mouth rinse  15 mL Mouth Rinse q12n4p  . [START ON 11/08/2019] simvastatin  20 mg Oral Daily  . sodium chloride flush  10-40 mL Intracatheter Q12H         CBG: Recent Labs  Lab 11/06/19 1628 11/06/19 2054 11/06/19 2142 11/07/19 0728 11/07/19 1227  GLUCAP 119* 124* 129* 102* 104*    SpO2: 100 % O2 Flow Rate (L/min): 1 L/min    CBC: Recent Labs  Lab 11/04/19 1818 11/05/19 0422 11/06/19 0526  WBC 5.6 5.7 6.1  NEUTROABS 4.2 4.5 4.7  HGB 13.4 12.3* 12.4*  HCT 38.7* 35.6* 36.9*  MCV 93.0 93.0 93.4  PLT 216 211 213  Basic Metabolic Panel: Recent Labs  Lab 11/04/19 1818 11/05/19 0422 11/06/19 0526  NA 124* 126* 126*  K 5.6* 5.0 5.2*  CL 92* 91* 95*  CO2 21* 19* 20*  GLUCOSE 63* 61* 95  BUN 24* 25* 20  CREATININE 1.22 1.23 1.10  CALCIUM 9.6 9.5 9.2  MG  --  2.0 2.3     Liver Function Tests: Recent Labs  Lab  11/04/19 1818  AST 35  ALT 17  ALKPHOS 206*  BILITOT 1.8*  PROT 8.4*  ALBUMIN 4.3     Antibiotics: Anti-infectives (From admission, onward)   None       DVT prophylaxis: Eliquis  Code Status: Full code  Family Communication: No family at bedside   Consultants:  Cardiology  Procedures:      Objective   Vitals:   11/07/19 1343 11/07/19 1348 11/07/19 1352 11/07/19 1356  BP: 99/66 97/69 (!) 88/71 (!) 88/62  Pulse:      Resp:      Temp:      TempSrc:      SpO2:      Weight:      Height:        Intake/Output Summary (Last 24 hours) at 11/07/2019 1512 Last data filed at 11/07/2019 0500 Gross per 24 hour  Intake 120 ml  Output 775 ml  Net -655 ml    10/29 1901 - 10/31 0700 In: 480 [P.O.:480] Out: 1225 [Urine:1225]  Filed Weights   11/06/19 0500 11/06/19 2138 11/07/19 0553  Weight: 74.7 kg 67.8 kg 67.5 kg    Physical Examination:  General-appears in no acute distress Heart-S1-S2, regular, no murmur auscultated Lungs-clear to auscultation bilaterally, no wheezing or crackles auscultated Abdomen-soft, nontender, no organomegaly, distended Extremities-trace edema in the lower extremities Neuro-alert, answers questions appropriately.  Status is: Inpatient  Dispo: The patient is from: Home              Anticipated d/c is to: Home versus skilled nursing facility              Anticipated d/c date is: 11/12/2019              Patient currently not medically stable for discharge  Barrier to discharge-treated for acute on chronic systolic CHF      Data Reviewed:   Recent Results (from the past 240 hour(s))  Respiratory Panel by RT PCR (Flu A&B, Covid) - Nasopharyngeal Swab     Status: None   Collection Time: 11/04/19  6:21 PM   Specimen: Nasopharyngeal Swab  Result Value Ref Range Status   SARS Coronavirus 2 by RT PCR NEGATIVE NEGATIVE Final    Comment: (NOTE) SARS-CoV-2 target nucleic acids are NOT DETECTED.  The SARS-CoV-2 RNA is  generally detectable in upper respiratoy specimens during the acute phase of infection. The lowest concentration of SARS-CoV-2 viral copies this assay can detect is 131 copies/mL. A negative result does not preclude SARS-Cov-2 infection and should not be used as the sole basis for treatment or other patient management decisions. A negative result may occur with  improper specimen collection/handling, submission of specimen other than nasopharyngeal swab, presence of viral mutation(s) within the areas targeted by this assay, and inadequate number of viral copies (<131 copies/mL). A negative result must be combined with clinical observations, patient history, and epidemiological information. The expected result is Negative.  Fact Sheet for Patients:  https://www.moore.com/  Fact Sheet for Healthcare Providers:  https://www.young.biz/  This test is no t yet approved or cleared  by the [place] and  has been authorized for detection and/or diagnosis of SARS-CoV-2 by FDA under an Emergency Use Authorization (EUA). This EUA will remain  in effect (meaning this test can be used) for the duration of the COVID-19 declaration under Section 564(b)(1) of the Act, 21 U.S.C. section 360bbb-3(b)(1), unless the authorization is terminated or revoked sooner.     Influenza A by PCR NEGATIVE NEGATIVE Final   Influenza B by PCR NEGATIVE NEGATIVE Final    Comment: (NOTE) The Xpert Xpress SARS-CoV-2/FLU/RSV assay is intended as an aid in  the diagnosis of influenza from Nasopharyngeal swab specimens and  should not be used as a sole basis for treatment. Nasal washings and  aspirates are unacceptable for Xpert Xpress SARS-CoV-2/FLU/RSV  testing.  Fact Sheet for Patients: https://www.moore.com/  Fact Sheet for Healthcare Providers: https://www.young.biz/  This test is not yet approved or cleared by the [place] FDA and  has been authorized for detection and/or diagnosis of SARS-CoV-2 by  FDA under an Emergency Use Authorization (EUA). This EUA will remain  in effect (meaning this test can be used) for the duration of the  Covid-19 declaration under Section 564(b)(1) of the Act, 21  U.S.C. section 360bbb-3(b)(1), unless the authorization is  terminated or revoked. Performed at [facility], [address], [place], [place] [address]   Group A Strep by PCR     Status: None   Collection Time: 11/04/19  6:45 PM   Specimen: Throat; Sterile Swab  Result Value Ref Range Status   Group A Strep by PCR NOT DETECTED NOT DETECTED Final    Comment: Performed at [facility], [address] [place], [place] [address]    No results for input(s): LIPASE, AMYLASE in the last 168 hours. No results for input(s): AMMONIA in the last 168 hours.  Cardiac Enzymes: No results for input(s): CKTOTAL, CKMB, CKMBINDEX, TROPONINI in the last 168 hours. BNP (last 3 results) Recent Labs    07/12/19 1913 11/04/19 1818  BNP 1,991.9* 1,651.0*    ProBNP (last 3 results) No results for input(s): PROBNP in the last 8760 hours.  Studies:  US Paracentesis  Result Date: 11/07/2019 INDICATION: Abdominal distention. History of congestive heart failure. Ascites. Request for therapeutic paracentesis. EXAM: ULTRASOUND GUIDED RIGHT LOWER QUADRANT PARACENTESIS MEDICATIONS: 1% plain lidocaine, 5 mL COMPLICATIONS: None immediate. PROCEDURE: Informed written consent was obtained from the patient after a discussion of the risks, benefits and alternatives to treatment. A timeout was performed prior to the initiation of the procedure. Initial ultrasound scanning demonstrates a large amount of ascites within the right lower abdominal quadrant. The right lower abdomen was prepped and draped in the usual sterile fashion. 1% lidocaine was used for local anesthesia. Following this, a 19 gauge,  7-cm, Yueh catheter was introduced. An ultrasound image was saved for documentation purposes. The paracentesis was performed. The catheter was removed and a dressing was applied. The patient tolerated the procedure well without immediate post procedural complication. FINDINGS: A total of approximately 4.8 L of clear yellow fluid was removed. IMPRESSION: Successful ultrasound-guided paracentesis yielding 4.8 liters of peritoneal fluid. Read by: Brayton El PA-C Electronically Signed   By: Gilmer Mor D.O.   On: 11/07/2019 14:40   DG CHEST PORT 1 VIEW  Result Date: 11/07/2019 CLINICAL DATA:  PICC line. EXAM: PORTABLE CHEST 1 VIEW COMPARISON:  11/05/2019 FINDINGS: Interval placement of LEFT-sided PICC line, tip overlying the superior vena cava. The heart is enlarged. Trace  RIGHT pleural effusions probably stable. There is increased minimal subsegmental atelectasis at both lung bases. No pulmonary edema. IMPRESSION: 1. Interval placement of LEFT-sided PICC line. 2. Increased bibasilar atelectasis. Electronically Signed   By: Norva Pavlov M.D.   On: 11/07/2019 12:51   [name] EKG SITE RITE  Result Date: 11/07/2019 If Site Rite image not attached, placement could not be confirmed due to current cardiac rhythm.      Meredeth Ide   [facility] If 7PM-7AM, please contact night-coverage at [email or web address], Office  [phone]   11/07/2019, 3:12 PM  LOS: 2 days

## 2019-11-07 NOTE — Progress Notes (Signed)
  Speech Language Pathology Treatment: Dysphagia  Patient Details Name: [name] MRN: [id number] DOB: [birth year] Today's Date: 11/07/2019 Time: 1035-1050 SLP Time Calculation (min) (ACUTE ONLY): 15 min  Assessment / Plan / Recommendation Clinical Impression  Pt was seen for dysphagia treatment and was cooperative throughout the session. Pt, nursing, and his niece reported that the pt has been tolerating the current diet without overt s/sx of aspiration. Nursing stated that his p.o. intake has not been been the best today. Pt tolerated dysphagia 3 solids, mixed consistency boluses, regular texture solids, and thin liquids via straw using consecutive swallows without symptoms of oropharyngeal dysphagia. Mastication was mildly prolonged but functional and no significant oral residue was noted. Pt has requested that the current diet of dysphagia 3 solids and thin liquids be continued, but his diet may be advanced to regular texture solids and thin liquids if he subsequently desires. Further skilled SLP services are not clinically indicated at this time.    HPI HPI: 64yo male admitted 10//28/21 with dyspnea. 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 CXR = Cardiomegaly with mild pulmonary venous congestion again noted.Similar findings on prior exam. Low lung volumes with mild bibasilar atelectasis. MBS 2007      SLP Plan  Discharge SLP treatment due to (comment);All goals met       Recommendations  Diet recommendations: Regular;Dysphagia 3 (mechanical soft);Thin liquid (Pt may be advanced to regular if he wishes) Liquids provided via: Cup;Straw Medication Administration: Crushed with puree Supervision: Patient able to self feed;Staff to assist with self feeding Compensations: Minimize environmental distractions;Slow rate;Small sips/bites Postural Changes and/or Swallow Maneuvers: Seated upright 90 degrees                 Oral Care Recommendations: Oral care BID Follow up Recommendations: None SLP Visit Diagnosis: Dysphagia, unspecified (R13.10) Plan: Discharge SLP treatment due to (comment);All goals met       Kaio Kuhlman I. Hardin Negus, [place], [place] Office number [phone] Pager [place] 11/07/2019, 11:33 AM

## 2019-11-07 NOTE — Progress Notes (Addendum)
Peripherally Inserted Central Catheter Placement  The IV Nurse has discussed with the patient and/or persons authorized to consent for the patient, the purpose of this procedure and the potential benefits and risks involved with this procedure.  The benefits include less needle sticks, lab draws from the catheter, and the patient may be discharged home with the catheter. Risks include, but not limited to, infection, bleeding, blood clot (thrombus formation), and puncture of an artery; nerve damage and irregular heartbeat and possibility to perform a PICC exchange if needed/ordered by physician.  Alternatives to this procedure were also discussed.  Bard Power PICC patient education guide, fact sheet on infection prevention and patient information card has been provided to patient /or left at bedside.  Telephone consent obtained from brother by RN.  Procedure also explained to pt and niece at bedside , verbal consent given after all questions answered.  PICC Placement Documentation  PICC Double Lumen 11/07/19 PICC Left Brachial 41 cm 0 cm (Active)  Indication for Insertion or Continuance of Line Vasoactive infusions;Chronic illness with exacerbations (CF, Sickle Cell, etc.) 11/07/19 1218  Exposed Catheter (cm) 0 cm 11/07/19 1218  Site Assessment Clean;Dry;Intact 11/07/19 1218  Lumen #1 Status Flushed;Saline locked;Blood return noted 11/07/19 1218  Lumen #2 Status Flushed;Saline locked;Blood return noted 11/07/19 1218  Dressing Type Transparent 11/07/19 1218  Dressing Status Clean;Dry;Intact 11/07/19 1218  Antimicrobial disc in place? Yes 11/07/19 1218  Safety Lock Not Applicable 11/07/19 1218  Line Care Connections checked and tightened 11/07/19 1218  Line Adjustment (NICU/IV Team Only) No 11/07/19 1218  Dressing Intervention New dressing 11/07/19 1218  Dressing Change Due 11/14/19 11/07/19 1218       Elliot Dally 11/07/2019, 12:19 PM

## 2019-11-07 NOTE — Consult Note (Signed)
Advanced Heart Failure Team Consult Note   Primary Physician: Mirna Mires, MD PCP-Cardiologist:  Verne Carrow, MD  Reason for Consultation: CHF  HPI:    [name] is seen today for evaluation of CHF at the request of Dr. Mayford Knife.   64 y.o. with history of chronic atrial fibrillation and nonischemic cardiomyopathy was admitted with CHF.  Patient had CVA in 2010, since that time has had expressive aphasia and right-sided weakness.  He lives with his niece.  She says that he gets around well generally at home and does all ADLs.  In 8/18, he was admited with new atrial flutter and EF was found to be 20%.  He had cath in 8/18 showing mild nonobstructive coronary disease.  In 10/18, he had DCCV to NSR.  In 11/18 while in NSR, echo showed EF up to 45-50%.  By 2019, he was back in atrial fibrillation.  He was not cardioverted again.  Echo in 4/21 showed EF 20-25%.    About 2 wks prior to admission, he reported increased dyspnea with exertion and developed abdominal swelling.  His niece brought him to the ER when increasing po Lasix at home did not help.  I reviewed echo this admission, EF about 20% with mod-severe RV dilation and moderate RV dysfunction, D-shaped septum, dilated IVC, severe biatrial enlargement.  He was initially given IV Lasix but this was stopped with soft BP and it appears he ended up getting some IV fluid.  SBP appears 90s-100s currently.  He has been in atrial fibrillation in 50s-60s.  Creatinine stable.  CT abdomen showed large volume ascites.   Review of Systems: All systems reviewed and negative except as per HPI.   PMH: 1. CVA: 2010, expressive aphasia and chronic right-sided weakness.  2. Atrial fibrillation/flutter: Chronic.  Had DCCV in 10/18 back to NSR but by 2019 was back in atrial fibrillation and appears to have been in fibrillation chronically.  3. Chronic systolic CHF: Nonischemic cardiomyopathy.  Diagnosed in 8/18 at time of admission with atrial  flutter/RVR.  Echo with EF 20%.  - LHC (8/18): Mild nonobstructive CAD.  - Echo (11/18): In NSR after DCCV, EF up to 45-50%.  - Echo (4/21): Back in atrial fibrillation, EF 20-25%.  - Echo (10/21): On my review, EF about 20%, severely dilated RV with moderately decreased systolic function, D-shaped septum, severe biatrial enlargement, IVC dilated.  4. HTN 5. Diabetes   Home Medications Prior to Admission medications   Medication Sig Start Date End Date Taking? Authorizing Provider  acetaminophen (TYLENOL) 500 MG tablet Take 1,000 mg by mouth every 6 (six) hours as needed for pain.   Yes [provider]  apixaban (ELIQUIS) 5 MG TABS tablet Take 1 tablet (5 mg total) by mouth 2 (two) times daily. 03/15/19  Yes Kathleene Hazel, MD  aspirin EC 81 MG EC tablet Take 1 tablet (81 mg total) by mouth daily. 09/09/16  Yes Leone Brand, NP  furosemide (LASIX) 40 MG tablet Take 2 tablets (80 mg) every AM and 1 tablet (40 mg) every PM Patient taking differently: Take 40-80 mg by mouth See admin instructions. Take 2 tablets (80 mg) every AM and 1 tablet (40 mg) every PM 11/03/19  Yes Kathleene Hazel, MD  glipiZIDE (GLUCOTROL) 10 MG tablet Take 1 tablet (10 mg total) by mouth daily with breakfast. 09/09/16  Yes Leone Brand, NP  loratadine (CLARITIN) 10 MG tablet Take 10 mg by mouth daily as needed for allergies.  06/29/18  Yes [provider]  metFORMIN (GLUCOPHAGE) 1000 MG tablet Take 1,000 mg by mouth 2 (two) times daily.   Yes [provider]  metoprolol succinate (TOPROL XL) 25 MG 24 hr tablet Take 1 tablet (25 mg total) by mouth daily. 10/06/19  Yes Kathleene Hazel, MD  Multiple Vitamin (MULTIVITAMIN WITH MINERALS) TABS Take 1 tablet by mouth daily.   Yes [provider]  omega-3 acid ethyl esters (LOVAZA) 1 G capsule Take 1 g by mouth daily.   Yes [provider]  sacubitril-valsartan (ENTRESTO) 24-26 MG Take 1 tablet by mouth 2 (two)  times daily. 09/23/19  Yes Kathleene Hazel, MD  simvastatin (ZOCOR) 40 MG tablet Take 40 mg by mouth daily.   Yes [provider]  Accu-Chek FastClix Lancets MISC 1 each by Other route as directed. 08/19/19   [provider]  ACCU-CHEK GUIDE test strip 1 each by Other route See admin instructions. 09/16/19   [provider]    Past Surgical History: Past Surgical History:  Procedure Laterality Date  . CARDIOVERSION N/A 10/10/2016   Procedure: CARDIOVERSION;  Surgeon: Lewayne Bunting, MD;  Location: [facility] ENDOSCOPY;  Service: Cardiovascular;  Laterality: N/A;  . RIGHT/LEFT HEART CATH AND CORONARY ANGIOGRAPHY N/A 09/06/2016   Procedure: RIGHT/LEFT HEART CATH AND CORONARY ANGIOGRAPHY;  Surgeon: Lennette Bihari, MD;  Location: [facility] INVASIVE CV LAB;  Service: Cardiovascular;  Laterality: N/A;  . TRACHEOSTOMY      Family History: Family History  Problem Relation Age of Onset  . Hypertension Mother   . Diabetes Mother   . Heart disease Father     Social History: Social History   Socioeconomic History  . Marital status: Single    Spouse name: Not on file  . Number of children: Not on file  . Years of education: Not on file  . Highest education level: Not on file  Occupational History  . Not on file  Tobacco Use  . Smoking status: Never Smoker  . Smokeless tobacco: Never Used  Vaping Use  . Vaping Use: Never used  Substance and Sexual Activity  . Alcohol use: No  . Drug use: No  . Sexual activity: Not Currently  Other Topics Concern  . Not on file  Social History Narrative  . Not on file   Social Determinants of Health   Financial Resource Strain:   . Difficulty of Paying Living Expenses: Not on file  Food Insecurity:   . Worried About Programme researcher, broadcasting/film/video in the Last Year: Not on file  . Ran Out of Food in the Last Year: Not on file  Transportation Needs:   . Lack of Transportation (Medical): Not on file  . Lack of Transportation  (Non-Medical): Not on file  Physical Activity:   . Days of Exercise per Week: Not on file  . Minutes of Exercise per Session: Not on file  Stress:   . Feeling of Stress : Not on file  Social Connections:   . Frequency of Communication with Friends and Family: Not on file  . Frequency of Social Gatherings with Friends and Family: Not on file  . Attends Religious Services: Not on file  . Active Member of Clubs or Organizations: Not on file  . Attends Banker Meetings: Not on file  . Marital Status: Not on file    Allergies:  No Known Allergies  Objective:    Vital Signs:   Temp:  [97.3 F (36.3 C)-97.7 F (  36.5 C)] 97.6 F (36.4 C) (10/31 0800) Pulse Rate:  [51-92] 92 (10/31 0800) Resp:  [16-21] 17 (10/31 0900) BP: (89-110)/(58-80) 96/67 (10/31 0800) SpO2:  [91 %-100 %] 100 % (10/31 0800) Weight:  [67.5 kg-67.8 kg] 67.5 kg (10/31 0553)    Weight change: Filed Weights   11/06/19 0500 11/06/19 2138 11/07/19 0553  Weight: 74.7 kg 67.8 kg 67.5 kg    Intake/Output:   Intake/Output Summary (Last 24 hours) at 11/07/2019 1044 Last data filed at 11/07/2019 0500 Gross per 24 hour  Intake 120 ml  Output 775 ml  Net -655 ml      Physical Exam    General:  Well appearing. No resp difficulty HEENT: normal Neck: supple. JVP 14+ cm. Carotids 2+ bilat; no bruits. No lymphadenopathy or thyromegaly appreciated. Cor: PMI nondisplaced. Irregular rate & rhythm. No definite S3. 2/6 HSM LLSB.  Lungs: clear Abdomen: soft, nontender, moderately distended. No hepatosplenomegaly. No bruits or masses. Good bowel sounds. Extremities: no cyanosis, clubbing, rash, edema Neuro: alert & orientedx3, cranial nerves grossly intact. moves all 4 extremities w/o difficulty. Affect pleasant   Telemetry   Atrial fibrillation 50s-60s (personally reviewed)  EKG    Atrial fibrillation rate in 50s (personally reviewed)  Labs   Basic Metabolic Panel: Recent Labs  Lab  11/04/19 1818 11/05/19 0422 11/06/19 0526  NA 124* 126* 126*  K 5.6* 5.0 5.2*  CL 92* 91* 95*  CO2 21* 19* 20*  GLUCOSE 63* 61* 95  BUN 24* 25* 20  CREATININE 1.22 1.23 1.10  CALCIUM 9.6 9.5 9.2  MG  --  2.0 2.3    Liver Function Tests: Recent Labs  Lab 11/04/19 1818  AST 35  ALT 17  ALKPHOS 206*  BILITOT 1.8*  PROT 8.4*  ALBUMIN 4.3   No results for input(s): LIPASE, AMYLASE in the last 168 hours. No results for input(s): AMMONIA in the last 168 hours.  CBC: Recent Labs  Lab 11/04/19 1818 11/05/19 0422 11/06/19 0526  WBC 5.6 5.7 6.1  NEUTROABS 4.2 4.5 4.7  HGB 13.4 12.3* 12.4*  HCT 38.7* 35.6* 36.9*  MCV 93.0 93.0 93.4  PLT 216 211 213    Cardiac Enzymes: No results for input(s): CKTOTAL, CKMB, CKMBINDEX, TROPONINI in the last 168 hours.  BNP: BNP (last 3 results) Recent Labs    07/12/19 1913 11/04/19 1818  BNP 1,991.9* 1,651.0*    ProBNP (last 3 results) No results for input(s): PROBNP in the last 8760 hours.   CBG: Recent Labs  Lab 11/06/19 1243 11/06/19 1628 11/06/19 2054 11/06/19 2142 11/07/19 0728  GLUCAP 135* 119* 124* 129* 102*    Coagulation Studies: No results for input(s): LABPROT, INR in the last 72 hours.   Imaging   [name] EKG SITE RITE  Result Date: 11/07/2019 If Site Rite image not attached, placement could not be confirmed due to current cardiac rhythm.     Medications:     Current Medications: . amiodarone  200 mg Oral Daily  . apixaban  5 mg Oral BID  . chlorhexidine  15 mL Mouth Rinse BID  . Chlorhexidine Gluconate Cloth  6 each Topical Daily  . furosemide  80 mg Intravenous BID  . insulin aspart  0-9 Units Subcutaneous TID WC  . lidocaine  1 application Urethral Once  . mouth rinse  15 mL Mouth Rinse q12n4p  . simvastatin  40 mg Oral Daily  . tolvaptan  15 mg Oral Once     Infusions:  Assessment/Plan   1. Acute on chronic systolic CHF: Nonischemic cardiomyopathy known since 2018.   Improved initially in 2018 with DCCV, but has been back in atrial fibrillation since 2019 it appears and EF has been low.  I reviewed echo this admission, EF about 20% with mod-severe RV dilation and moderate RV dysfunction, D-shaped septum, dilated IVC, severe biatrial enlargement.  Significant RV failure with ascites on CT abdomen. With SBP 90s and inability to tolerate GDMT, concerned for low output HF.  He is volume overloaded on exam.  - Will place PICC line, follow CVP/co-ox.  If low co-ox, start milrinone 0.25. If unable to get PICC today, will start empiric milrinone.  - Lasix 80 mg IV bid.  - Will give dose of tolvaptan 15 mg x 1 with hyponatremia.  - Ideally will attempt DCCV, he initially improved in NSR back in 2018.  However, not in RVR so doubt this is primarily a tachy-mediated CMP.  - Cardiac MRI after some diuresis to look for infiltrative disease/myocarditis.  - Will likely need a formal RHC when more diuresed.   - Narrow QRS, not CRT candidate.  2. Atrial fibrillation: Now chronic.  Initially in 2018, EF improved after DCCV.  However, as above, he is not in RVR so not sure that cardiomyopathy is primarily due to AF.   - Continue Eliquis, has not missed doses.  - Will add amiodarone 200 mg daily (low dose given relatively slow VR).  - Would like to cardiovert after diuresis.  3. Ascites: Large ascites on CT.  Suspect due to RV failure.  - Will arrange for IR paracentesis. 4 4. H/o CVA: In 2010, with expressive aphasia and right-sided weakness.  Per niece, he is at baseline.  - PT/OT.  5. Hyponatremia: Hypervolemic hypernatremia.   - Tolvaptan as above.   Length of Stay: 2  Marca Ancona, MD  11/07/2019, 10:44 AM  Advanced Heart Failure Team Pager [phone] (M-F; 7a - 4p)  Please contact [facility] for night-coverage after hours (4p -7a ) and weekends on [email or web address]

## 2019-11-07 NOTE — Progress Notes (Signed)
Spoke with Dr Shirlee Latch, notified PICC cannot be placed today by VAS Team.

## 2019-11-07 NOTE — Procedures (Signed)
  PROCEDURE SUMMARY:  Successful US guided paracentesis from RLQ.  Yielded 4.8 L of clear yellow fluid.  No immediate complications.  Pt tolerated well.   Specimen was not sent for labs.  EBL < 70mL  Brayton El PA-C 11/07/2019 2:39 PM

## 2019-11-08 ENCOUNTER — Inpatient Hospital Stay (HOSPITAL_COMMUNITY): Payer: Medicaid Other

## 2019-11-08 DIAGNOSIS — I5023 Acute on chronic systolic (congestive) heart failure: Secondary | ICD-10-CM

## 2019-11-08 DIAGNOSIS — R609 Edema, unspecified: Secondary | ICD-10-CM

## 2019-11-08 LAB — GLUCOSE, CAPILLARY
Glucose-Capillary: 104 mg/dL — ABNORMAL HIGH (ref 70–99)
Glucose-Capillary: 181 mg/dL — ABNORMAL HIGH (ref 70–99)
Glucose-Capillary: 89 mg/dL (ref 70–99)
Glucose-Capillary: 131 mg/dL — ABNORMAL HIGH (ref 70–99)

## 2019-11-08 LAB — COOXEMETRY PANEL
Carboxyhemoglobin: 1.5 % (ref 0.5–1.5)
Methemoglobin: 0.7 % (ref 0.0–1.5)
O2 Saturation: 69.9 %
Total hemoglobin: 11.6 g/dL — ABNORMAL LOW (ref 12.0–16.0)

## 2019-11-08 LAB — CBC WITH DIFFERENTIAL/PLATELET
Abs Immature Granulocytes: 0.02 10*3/uL (ref 0.00–0.07)
Basophils Absolute: 0 10*3/uL (ref 0.0–0.1)
Basophils Relative: 1 %
Eosinophils Absolute: 0.1 10*3/uL (ref 0.0–0.5)
Eosinophils Relative: 3 %
HCT: 33.1 % — ABNORMAL LOW (ref 39.0–52.0)
Hemoglobin: 11.3 g/dL — ABNORMAL LOW (ref 13.0–17.0)
Immature Granulocytes: 0 %
Lymphocytes Relative: 11 %
Lymphs Abs: 0.6 10*3/uL — ABNORMAL LOW (ref 0.7–4.0)
MCH: 31.7 pg (ref 26.0–34.0)
MCHC: 34.1 g/dL (ref 30.0–36.0)
MCV: 93 fL (ref 80.0–100.0)
Monocytes Absolute: 0.5 10*3/uL (ref 0.1–1.0)
Monocytes Relative: 9 %
Neutro Abs: 4.2 10*3/uL (ref 1.7–7.7)
Neutrophils Relative %: 76 %
Platelets: 200 10*3/uL (ref 150–400)
RBC: 3.56 MIL/uL — ABNORMAL LOW (ref 4.22–5.81)
RDW: 15.6 % — ABNORMAL HIGH (ref 11.5–15.5)
WBC: 5.5 10*3/uL (ref 4.0–10.5)
nRBC: 0 % (ref 0.0–0.2)

## 2019-11-08 LAB — BASIC METABOLIC PANEL
Anion gap: 8 (ref 5–15)
BUN: 13 mg/dL (ref 8–23)
CO2: 24 mmol/L (ref 22–32)
Calcium: 8.7 mg/dL — ABNORMAL LOW (ref 8.9–10.3)
Chloride: 103 mmol/L (ref 98–111)
Creatinine, Ser: 1.02 mg/dL (ref 0.61–1.24)
GFR, Estimated: >60 mL/min (ref >60)
Glucose, Bld: 116 mg/dL — ABNORMAL HIGH (ref 70–99)
Potassium: 4.1 mmol/L (ref 3.5–5.1)
Sodium: 135 mmol/L (ref 135–145)

## 2019-11-08 LAB — MAGNESIUM: Magnesium: 2 mg/dL (ref 1.7–2.4)

## 2019-11-08 MED ORDER — GADOBUTROL 1 MMOL/ML IV SOLN
7.0000 mL | Freq: Once | INTRAVENOUS | Status: AC | PRN
  Administered 2019-11-08: 7 mL via INTRAVENOUS

## 2019-11-08 MED ORDER — DIGOXIN 125 MCG PO TABS
0.1250 mg | ORAL_TABLET | Freq: Every day | ORAL | Status: DC
  Administered 2019-11-08 – 2019-11-13 (×6): 0.125 mg via ORAL
  Filled 2019-11-08 (×6): qty 1

## 2019-11-08 NOTE — Progress Notes (Signed)
[facility]  PROGRESS NOTE  Zaccheaus Storlie BPZ:[id number] DOB: [birth year] DOA: 11/04/2019 PCP: Mirna Mires, MD   Brief HPI:   64 year old male with a history of stroke with expressive aphasia, right-sided paralysis, nonischemic cardiomyopathy, systolic CHF EF 20 to 25% as of April 2021 echo, hypertension, diabetes mellitus type 2, persistent atrial flutter/atrial fibrillation came to ED with chief complaints of dyspnea.  Patient became progressively more dyspneic at home.  He had been ambulating with cane and started developing worsening distention/swelling in the abdomen.  His Lasix dosing was increased outpatient per his cardiologist with minimal improvement.  Due to progressively worsening dyspnea and abdominal swelling he was brought to the ED for further evaluation. He was found to have elevated BNP, indeterminate troponins.  Initially started on Lasix for diuresis for abdominal ascites.  He became more hypotensive and bradycardic requiring discontinuation of Lasix.  Cardiology was consulted for concern of development cardiogenic shock.  Cardiology recommended transfer to [facility] for evaluation by hospice failure team.  Entresto, metoprolol, Lasix are currently on hold.    Subjective   Patient seen and examined, denies shortness of breath.  Has been started on milrinone along with Lasix per heart failure team.   Assessment/Plan:     1. Acute on chronic systolic CHF-echo in 2021 showed EF 20 to 25%, repeat echo done during this hospitalization showed EF of 15 to 20%.  BNP 1651.  Initially started on Lasix however became bradycardic and hypotensive so Lasix was held.  Likely cardiogenic shock.  Heart failure team consulted.  Patient started on milrinone and IV Lasix. 2. Cardiogenic shock-patient was started on IV Lasix however became bradycardic and hypotensive, Lasix is currently on hold.  Cardiology consulted as above.  On milrinone and IV Lasix. 3. Hyponatremia-secondary  likely hypervolemic hyponatremia from underlying CHF.  Sodium has improved today to 135.  He received 1 dose of tolvaptan yesterday.  Continue diuresis with inotropic support as above.  We will follow serum sodium level in a.m.   4. Persistent atrial fibrillation-beta-blockers on hold due to hypotension.  Continue Eliquis. 5. Diabetes mellitus type 2-patient was hypoglycemic on admission, continue hypoglycemic protocol.  Sliding scale insulin with NovoLog. 6. History of stroke-residual severe expressive aphasia, has right-sided paralysis     COVID-19 Labs  No results for input(s): DDIMER, FERRITIN, LDH, CRP in the last 72 hours.  Lab Results  Component Value Date   SARSCOV2NAA NEGATIVE 11/04/2019   SARSCOV2NAA NEGATIVE 07/13/2019     Scheduled medications:   . amiodarone  200 mg Oral Daily  . apixaban  5 mg Oral BID  . chlorhexidine  15 mL Mouth Rinse BID  . Chlorhexidine Gluconate Cloth  6 each Topical Daily  . digoxin  0.125 mg Oral Daily  . furosemide  80 mg Intravenous BID  . insulin aspart  0-9 Units Subcutaneous TID WC  . lidocaine  1 application Urethral Once  . mouth rinse  15 mL Mouth Rinse q12n4p  . simvastatin  20 mg Oral Daily  . sodium chloride flush  10-40 mL Intracatheter Q12H         CBG: Recent Labs  Lab 11/07/19 1227 11/07/19 1620 11/07/19 2224 11/08/19 0730 11/08/19 1303  GLUCAP 104* 105* 143* 104* 181*    SpO2: 98 % O2 Flow Rate (L/min): 1 L/min    CBC: Recent Labs  Lab 11/04/19 1818 11/05/19 0422 11/06/19 0526 11/08/19 0553  WBC 5.6 5.7 6.1 5.5  NEUTROABS 4.2 4.5 4.7 4.2  HGB 13.4 12.3*  12.4* 11.3*  HCT 38.7* 35.6* 36.9* 33.1*  MCV 93.0 93.0 93.4 93.0  PLT 216 211 213 200    Basic Metabolic Panel: Recent Labs  Lab 11/04/19 1818 11/05/19 0422 11/06/19 0526 11/07/19 2103 11/08/19 0553  NA 124* 126* 126* 134* 135  K 5.6* 5.0 5.2*  --  4.1  CL 92* 91* 95*  --  103  CO2 21* 19* 20*  --  24  GLUCOSE 63* 61* 95  --  116*   BUN 24* 25* 20  --  13  CREATININE 1.22 1.23 1.10  --  1.02  CALCIUM 9.6 9.5 9.2  --  8.7*  MG  --  2.0 2.3  --  2.0     Liver Function Tests: Recent Labs  Lab 11/04/19 1818 11/07/19 1547  AST 35 39  ALT 17 20  ALKPHOS 206* 163*  BILITOT 1.8* 2.0*  PROT 8.4* 6.4*  ALBUMIN 4.3 3.2*     Antibiotics: Anti-infectives (From admission, onward)   None       DVT prophylaxis: Eliquis  Code Status: Full code  Family Communication: No family at bedside   Consultants:  Cardiology  Procedures:      Objective   Vitals:   11/08/19 0545 11/08/19 0837 11/08/19 0840 11/08/19 0908  BP: (!) 81/58 (!) 85/67 (!) 85/67 91/72  Pulse: (!) 54 64 67 82  Resp: 14 16 19 20   Temp: 98.6 F (37 C) 98.5 F (36.9 C)    TempSrc: Oral Oral    SpO2: 99% 96% 98% 98%  Weight:      Height:        Intake/Output Summary (Last 24 hours) at 11/08/2019 1316 Last data filed at 11/08/2019 1100 Gross per 24 hour  Intake 352.54 ml  Output 4200 ml  Net -3847.46 ml    10/30 1901 - 11/01 0700 In: 260 [P.O.:240; I.V.:20] Out: 4575 [Urine:4575]  Filed Weights   11/06/19 0500 11/06/19 2138 11/07/19 0553  Weight: 74.7 kg 67.8 kg 67.5 kg    Physical Examination:  General-appears in no acute distress Heart-S1-S2, regular, no murmur auscultated Lungs-clear to auscultation bilaterally, no wheezing or crackles auscultated Abdomen-soft, nontender, no organomegaly Extremities-no edema in the lower extremities Neuro-alert, oriented x3, expressive aphasia, moving all extremities  Status is: Inpatient  Dispo: The patient is from: Home              Anticipated d/c is to: Home versus skilled nursing facility              Anticipated d/c date is: 11/12/2019              Patient currently not medically stable for discharge  Barrier to discharge-treated for acute on chronic systolic CHF      Data Reviewed:   Recent Results (from the past 240 hour(s))  Respiratory Panel by RT PCR (Flu  A&B, Covid) - Nasopharyngeal Swab     Status: None   Collection Time: 11/04/19  6:21 PM   Specimen: Nasopharyngeal Swab  Result Value Ref Range Status   SARS Coronavirus 2 by RT PCR NEGATIVE NEGATIVE Final    Comment: (NOTE) SARS-CoV-2 target nucleic acids are NOT DETECTED.  The SARS-CoV-2 RNA is generally detectable in upper respiratoy specimens during the acute phase of infection. The lowest concentration of SARS-CoV-2 viral copies this assay can detect is 131 copies/mL. A negative result does not preclude SARS-Cov-2 infection and should not be used as the sole basis for treatment or other patient management  decisions. A negative result may occur with  improper specimen collection/handling, submission of specimen other than nasopharyngeal swab, presence of viral mutation(s) within the areas targeted by this assay, and inadequate number of viral copies (<131 copies/mL). A negative result must be combined with clinical observations, patient history, and epidemiological information. The expected result is Negative.  Fact Sheet for Patients:  https://www.moore.com/  Fact Sheet for Healthcare Providers:  https://www.young.biz/  This test is no t yet approved or cleared by the [place] FDA and  has been authorized for detection and/or diagnosis of SARS-CoV-2 by FDA under an Emergency Use Authorization (EUA). This EUA will remain  in effect (meaning this test can be used) for the duration of the COVID-19 declaration under Section 564(b)(1) of the Act, 21 U.S.C. section 360bbb-3(b)(1), unless the authorization is terminated or revoked sooner.     Influenza A by PCR NEGATIVE NEGATIVE Final   Influenza B by PCR NEGATIVE NEGATIVE Final    Comment: (NOTE) The Xpert Xpress SARS-CoV-2/FLU/RSV assay is intended as an aid in  the diagnosis of influenza from Nasopharyngeal swab specimens and  should not be used as a sole basis for treatment. Nasal  washings and  aspirates are unacceptable for Xpert Xpress SARS-CoV-2/FLU/RSV  testing.  Fact Sheet for Patients: https://www.moore.com/  Fact Sheet for Healthcare Providers: https://www.young.biz/  This test is not yet approved or cleared by the [place] FDA and  has been authorized for detection and/or diagnosis of SARS-CoV-2 by  FDA under an Emergency Use Authorization (EUA). This EUA will remain  in effect (meaning this test can be used) for the duration of the  Covid-19 declaration under Section 564(b)(1) of the Act, 21  U.S.C. section 360bbb-3(b)(1), unless the authorization is  terminated or revoked. Performed at [facility], [address], [place], [place] [address]   Group A Strep by PCR     Status: None   Collection Time: 11/04/19  6:45 PM   Specimen: Throat; Sterile Swab  Result Value Ref Range Status   Group A Strep by PCR NOT DETECTED NOT DETECTED Final    Comment: Performed at [facility], [address], [place], [place] [address]    No results for input(s): LIPASE, AMYLASE in the last 168 hours. No results for input(s): AMMONIA in the last 168 hours.  Cardiac Enzymes: No results for input(s): CKTOTAL, CKMB, CKMBINDEX, TROPONINI in the last 168 hours. BNP (last 3 results) Recent Labs    07/12/19 1913 11/04/19 1818  BNP 1,991.9* 1,651.0*    ProBNP (last 3 results) No results for input(s): PROBNP in the last 8760 hours.  Studies:  US Paracentesis  Result Date: 11/07/2019 INDICATION: Abdominal distention. History of congestive heart failure. Ascites. Request for therapeutic paracentesis. EXAM: ULTRASOUND GUIDED RIGHT LOWER QUADRANT PARACENTESIS MEDICATIONS: 1% plain lidocaine, 5 mL COMPLICATIONS: None immediate. PROCEDURE: Informed written consent was obtained from the patient after a discussion of the risks, benefits and alternatives to treatment. A timeout was performed  prior to the initiation of the procedure. Initial ultrasound scanning demonstrates a large amount of ascites within the right lower abdominal quadrant. The right lower abdomen was prepped and draped in the usual sterile fashion. 1% lidocaine was used for local anesthesia. Following this, a 19 gauge, 7-cm, Yueh catheter was introduced. An ultrasound image was saved for documentation purposes. The paracentesis was performed. The catheter was removed and a dressing was applied. The patient tolerated the procedure well without immediate post procedural complication. FINDINGS: A total  of approximately 4.8 L of clear yellow fluid was removed. IMPRESSION: Successful ultrasound-guided paracentesis yielding 4.8 liters of peritoneal fluid. Read by: Brayton El PA-C Electronically Signed   By: Gilmer Mor D.O.   On: 11/07/2019 14:40   DG CHEST PORT 1 VIEW  Result Date: 11/07/2019 CLINICAL DATA:  PICC line. EXAM: PORTABLE CHEST 1 VIEW COMPARISON:  11/05/2019 FINDINGS: Interval placement of LEFT-sided PICC line, tip overlying the superior vena cava. The heart is enlarged. Trace RIGHT pleural effusions probably stable. There is increased minimal subsegmental atelectasis at both lung bases. No pulmonary edema. IMPRESSION: 1. Interval placement of LEFT-sided PICC line. 2. Increased bibasilar atelectasis. Electronically Signed   By: Norva Pavlov M.D.   On: 11/07/2019 12:51   [name] EKG SITE RITE  Result Date: 11/07/2019 If Site Rite image not attached, placement could not be confirmed due to current cardiac rhythm.      Meredeth Ide   [facility] If 7PM-7AM, please contact night-coverage at [email or web address], Office  [phone]   11/08/2019, 1:16 PM  LOS: 3 days

## 2019-11-08 NOTE — Progress Notes (Signed)
Right upper extremity venous duplex completed. Refer to "CV Proc" under chart review to view preliminary results.  11/08/2019 2:17 PM Eula Fried., MHA, RVT, RDCS, RDMS

## 2019-11-08 NOTE — Progress Notes (Signed)
Patient ID: [name], male   DOB: 04-16-1955, 64 y.o.   MRN: [id number]     Advanced Heart Failure Rounding Note  PCP-Cardiologist: Verne Carrow, MD   Subjective:    He appears comfortable.  Niece is not here this morning to interpret for him.   Milrinone begun yesterday with co-ox 55%, now up to 70%.  BP remains soft in the 80s-90s systolic range but creatinine stable at 1.02.  He diuresed well but not weighed yet.  CVP 11-12 this morning.  Sodium up to 135, got dose of tolvaptan.   HR 60s-70s afib.   Paracentesis (10/31) with 4.8 L out.    Objective:   Weight Range: 67.5 kg Body mass index is 23.31 kg/m.   Vital Signs:   Temp:  [97.6 F (36.4 C)-98.7 F (37.1 C)] 98.6 F (37 C) (11/01 0545) Pulse Rate:  [37-92] 54 (11/01 0545) Resp:  [14-21] 14 (11/01 0545) BP: (81-100)/(46-73) 81/58 (11/01 0545) SpO2:  [96 %-100 %] 99 % (11/01 0545)    Weight change: Filed Weights   11/06/19 0500 11/06/19 2138 11/07/19 0553  Weight: 74.7 kg 67.8 kg 67.5 kg    Intake/Output:   Intake/Output Summary (Last 24 hours) at 11/08/2019 0728 Last data filed at 11/08/2019 0536 Gross per 24 hour  Intake 140 ml  Output 4200 ml  Net -4060 ml      Physical Exam    General:  Well appearing. No resp difficulty HEENT: Normal Neck: Supple. JVP 10-12 cm. Carotids 2+ bilat; no bruits. No lymphadenopathy or thyromegaly appreciated. Cor: PMI nondisplaced. Irregular rate & rhythm. No rubs, gallops or murmurs. Lungs: Clear Abdomen: Soft, nontender, nondistended. No hepatosplenomegaly. No bruits or masses. Good bowel sounds. Extremities: No cyanosis, clubbing, rash, edema Neuro: Alert & orientedx3, cranial nerves grossly intact. moves all 4 extremities w/o difficulty. Affect pleasant   Telemetry   Atrial fibrillation in 60s-70s (personally reviewed)  Labs    CBC Recent Labs    11/06/19 0526 11/08/19 0553  WBC 6.1 5.5  NEUTROABS 4.7 4.2  HGB 12.4* 11.3*  HCT 36.9*  33.1*  MCV 93.4 93.0  PLT 213 200   Basic Metabolic Panel Recent Labs    98/26/41 0526 11/06/19 0526 11/07/19 2103 11/08/19 0553  NA 126*   < > 134* 135  K 5.2*  --   --  4.1  CL 95*  --   --  103  CO2 20*  --   --  24  GLUCOSE 95  --   --  116*  BUN 20  --   --  13  CREATININE 1.10  --   --  1.02  CALCIUM 9.2  --   --  8.7*  MG 2.3  --   --  2.0   < > = values in this interval not displayed.   Liver Function Tests Recent Labs    11/07/19 1547  AST 39  ALT 20  ALKPHOS 163*  BILITOT 2.0*  PROT 6.4*  ALBUMIN 3.2*   No results for input(s): LIPASE, AMYLASE in the last 72 hours. Cardiac Enzymes No results for input(s): CKTOTAL, CKMB, CKMBINDEX, TROPONINI in the last 72 hours.  BNP: BNP (last 3 results) Recent Labs    07/12/19 1913 11/04/19 1818  BNP 1,991.9* 1,651.0*    ProBNP (last 3 results) No results for input(s): PROBNP in the last 8760 hours.   D-Dimer No results for input(s): DDIMER in the last 72 hours. Hemoglobin A1C No results for input(s): HGBA1C in  the last 72 hours. Fasting Lipid Panel No results for input(s): CHOL, HDL, LDLCALC, TRIG, CHOLHDL, LDLDIRECT in the last 72 hours. Thyroid Function Tests No results for input(s): TSH, T4TOTAL, T3FREE, THYROIDAB in the last 72 hours.  Invalid input(s): FREET3  Other results:   Imaging    US Paracentesis  Result Date: 11/07/2019 INDICATION: Abdominal distention. History of congestive heart failure. Ascites. Request for therapeutic paracentesis. EXAM: ULTRASOUND GUIDED RIGHT LOWER QUADRANT PARACENTESIS MEDICATIONS: 1% plain lidocaine, 5 mL COMPLICATIONS: None immediate. PROCEDURE: Informed written consent was obtained from the patient after a discussion of the risks, benefits and alternatives to treatment. A timeout was performed prior to the initiation of the procedure. Initial ultrasound scanning demonstrates a large amount of ascites within the right lower abdominal quadrant. The right lower  abdomen was prepped and draped in the usual sterile fashion. 1% lidocaine was used for local anesthesia. Following this, a 19 gauge, 7-cm, Yueh catheter was introduced. An ultrasound image was saved for documentation purposes. The paracentesis was performed. The catheter was removed and a dressing was applied. The patient tolerated the procedure well without immediate post procedural complication. FINDINGS: A total of approximately 4.8 L of clear yellow fluid was removed. IMPRESSION: Successful ultrasound-guided paracentesis yielding 4.8 liters of peritoneal fluid. Read by: Brayton El PA-C Electronically Signed   By: Gilmer Mor D.O.   On: 11/07/2019 14:40   DG CHEST PORT 1 VIEW  Result Date: 11/07/2019 CLINICAL DATA:  PICC line. EXAM: PORTABLE CHEST 1 VIEW COMPARISON:  11/05/2019 FINDINGS: Interval placement of LEFT-sided PICC line, tip overlying the superior vena cava. The heart is enlarged. Trace RIGHT pleural effusions probably stable. There is increased minimal subsegmental atelectasis at both lung bases. No pulmonary edema. IMPRESSION: 1. Interval placement of LEFT-sided PICC line. 2. Increased bibasilar atelectasis. Electronically Signed   By: Norva Pavlov M.D.   On: 11/07/2019 12:51   [name] EKG SITE RITE  Result Date: 11/07/2019 If Site Rite image not attached, placement could not be confirmed due to current cardiac rhythm.     Medications:     Scheduled Medications: . amiodarone  200 mg Oral Daily  . apixaban  5 mg Oral BID  . chlorhexidine  15 mL Mouth Rinse BID  . Chlorhexidine Gluconate Cloth  6 each Topical Daily  . digoxin  0.125 mg Oral Daily  . furosemide  80 mg Intravenous BID  . insulin aspart  0-9 Units Subcutaneous TID WC  . lidocaine  1 application Urethral Once  . mouth rinse  15 mL Mouth Rinse q12n4p  . simvastatin  20 mg Oral Daily  . sodium chloride flush  10-40 mL Intracatheter Q12H     Infusions: . milrinone 0.25 mcg/kg/min (11/07/19 1555)      PRN Medications:  acetaminophen **OR** acetaminophen, lidocaine, loratadine, ondansetron **OR** ondansetron (ZOFRAN) IV, oxyCODONE, sodium chloride flush   Assessment/Plan   1. Acute on chronic systolic CHF: Nonischemic cardiomyopathy known since 2018.  Improved initially in 2018 with DCCV, but has been back in atrial fibrillation since 2019 it appears and EF has been low.  I reviewed echo this admission, EF about 20% with mod-severe RV dilation and moderate RV dysfunction, D-shaped septum, dilated IVC, severe biatrial enlargement. Significant RV failure with ascites on CT abdomen. With SBP 90s and inability to tolerate GDMT, concerned for low output HF.  PICC placed, co-ox 55% and milrinone 0.25 started.  This morning, co-ox 70%.  CVP 11-12 after good diuresis with IV Lasix and 1 dose tolvaptan  yesterday.   - Continue milrinone 0.25.  - Add digoxin 0.125, HR 60s-70s.   - BP too soft for other GDMT.  - Lasix 80 mg IV bid again today.  - Ideally will attempt DCCV, he initially improved in NSR back in 2018.  However, not in RVR so doubt this is primarily a tachy-mediated CMP.  - Cardiac MRI after some diuresis to look for infiltrative disease/myocarditis => will order today.  - Will likely need a formal RHC when more diuresed.   - Narrow QRS, not CRT candidate.  2. Atrial fibrillation: Now chronic.  Initially in 2018, EF improved after DCCV.  However, as above, he is not in RVR so not sure that cardiomyopathy is primarily due to AF.   - Continue Eliquis, has not missed doses.  - Continue amiodarone 200 mg daily (low dose given relatively slow VR).  - Would like to cardiovert after diuresis and milrinone weaning.  3. Ascites: Large ascites on CT.  Suspect due to RV failure. Paracentesis on 10/31 with 4.8 L out.  4. H/o CVA: In 2010, with expressive aphasia and right-sided weakness.  Per niece, he is at baseline.  - PT/OT.  5. Hyponatremia: Hypervolemic hypernatremia => resolved with 1  dose tolvaptan.    Length of Stay: 3  Marca Ancona, MD  11/08/2019, 7:28 AM  Advanced Heart Failure Team Pager [phone] (M-F; 7a - 4p)  Please contact [facility] for night-coverage after hours (4p -7a ) and weekends on [email or web address]

## 2019-11-08 NOTE — Progress Notes (Signed)
PT Cancellation Note  Patient Details Name: [name] MRN: [id number] DOB: 01/11/1955   Cancelled Treatment:    Reason Eval/Treat Not Completed: Other (comment).  Pt is off the floor when PT arrived, will reattempt as time and pt allow.   [name] 11/08/2019, 11:27 AM   Samul Dada, PT MS Acute Rehab Dept. Number: [facility] [id number] and [facility] [phone]

## 2019-11-08 NOTE — Evaluation (Signed)
Physical Therapy Evaluation Patient Details Name: [name] MRN: [id number] DOB: Jun 15, 1955 Today's Date: 11/08/2019   History of Present Illness  64 yo male with onset of acute CHF was admitted with SOB and inability to move without SOB.  Pt received a paracentesis, removing 4.8L O2.  Pt is referred to PT for mobility assessment.  PMHx:  EF 20-25%, a-flutter, CHF, DM, HTN, NICM, stroke with R hemiparesis  Clinical Impression  Pt was seen for mobility on RW but could not step due to his RUE grip change and poor tolerance for standing.  Pt is expecting to get up to walk with PT but presently is not strong enough to attempt this safely.  His plan is to work toward CIR admission to restore his gait skills, and to decrease fall risk with one family member to assist him.    Follow Up Recommendations CIR    Equipment Recommendations  None recommended by PT    Recommendations for Other Services Rehab consult     Precautions / Restrictions Precautions Precautions: Fall Precaution Comments: requires assist for all movement Restrictions Weight Bearing Restrictions: No      Mobility  Bed Mobility Overal bed mobility: Needs Assistance Bed Mobility: Supine to Sit;Sit to Supine     Supine to sit: Min assist Sit to supine: Min assist        Transfers Overall transfer level: Needs assistance Equipment used: Rolling walker (2 wheeled);1 person hand held assist Transfers: Sit to/from Stand Sit to Stand: Mod assist;From elevated surface         General transfer comment: cannot push up well from RLE and cannot use R hand to grip walker  Ambulation/Gait             General Gait Details: unable to take steps  Stairs            Wheelchair Mobility    Modified Rankin (Stroke Patients Only)       Balance Overall balance assessment: Needs assistance Sitting-balance support: Feet supported;Bilateral upper extremity supported Sitting balance-Leahy Scale: Fair      Standing balance support: Bilateral upper extremity supported;During functional activity Standing balance-Leahy Scale: Poor                               Pertinent Vitals/Pain Pain Assessment: Faces Faces Pain Scale: Hurts a little bit Pain Location: soreness of stretching R side Pain Descriptors / Indicators: Discomfort Pain Intervention(s): Monitored during session;Repositioned    Home Living Family/patient expects to be discharged to:: Private residence Living Arrangements: Other relatives Available Help at Discharge: Family;Available 24 hours/day Type of Home: House Home Access: Ramped entrance     Home Layout: One level Home Equipment: Cane - single point;Walker - 2 wheels;Shower seat - built in;Bedside commode Additional Comments: family were source of information as pt cannot answer questions, not oriented to place and time    Prior Function Level of Independence: Needs assistance   Gait / Transfers Assistance Needed: prev had walked on [facility]  ADL's / Homemaking Assistance Needed: famly helps with transport, meals meds  Comments: Pt was able to move but  not able to walk     Hand Dominance   Dominant Hand: Left    Extremity/Trunk Assessment   Upper Extremity Assessment Upper Extremity Assessment: RUE deficits/detail RUE Deficits / Details: R side weak and cannot grip walker    Lower Extremity Assessment Lower Extremity Assessment: RLE deficits/detail RLE Deficits /  Details: generalized weakness RLE Coordination: decreased gross motor       Communication   Communication: Expressive difficulties  Cognition Arousal/Alertness: Awake/alert Behavior During Therapy: Impulsive Overall Cognitive Status: No family/caregiver present to determine baseline cognitive functioning                                 General Comments: not oriented to place and time but family not there to discuss PLOF      General Comments General comments  (skin integrity, edema, etc.): pt has poor control of standing and unable to step, but also seems confused about the sequence of movement to take a step    Exercises     Assessment/Plan    PT Assessment Patient needs continued PT services  PT Problem List Decreased strength;Decreased range of motion;Decreased activity tolerance;Decreased balance;Decreased mobility;Decreased coordination;Decreased cognition;Decreased knowledge of use of DME;Decreased safety awareness       PT Treatment Interventions Gait training;DME instruction;Stair training;Functional mobility training;Therapeutic activities;Therapeutic exercise;Balance training;Neuromuscular re-education;Patient/family education    PT Goals (Current goals can be found in the Care Plan section)  Acute Rehab PT Goals Patient Stated Goal: cannot verbalize but nodded yes PT Goal Formulation: With patient Time For Goal Achievement: 11/22/19 Potential to Achieve Goals: Good    Frequency Min 3X/week   Barriers to discharge Decreased caregiver support one family member at a time to help    Co-evaluation               AM-PAC PT "6 Clicks" Mobility  Outcome Measure Help needed turning from your back to your side while in a flat bed without using bedrails?: A Little Help needed moving from lying on your back to sitting on the side of a flat bed without using bedrails?: A Little Help needed moving to and from a bed to a chair (including a wheelchair)?: A Lot Help needed standing up from a chair using your arms (e.g., wheelchair or bedside chair)?: A Lot Help needed to walk in hospital room?: Total Help needed climbing 3-5 steps with a railing? : Total 6 Click Score: 12    End of Session Equipment Utilized During Treatment: Gait belt Activity Tolerance: Patient limited by fatigue;Treatment limited secondary to medical complications (Comment) Patient left: in bed;with call bell/phone within reach;with bed alarm set Nurse  Communication: Mobility status PT Visit Diagnosis: Unsteadiness on feet (R26.81);Muscle weakness (generalized) (M62.81);Hemiplegia and hemiparesis Hemiplegia - Right/Left: Right Hemiplegia - dominant/non-dominant: Dominant    Time: 1448-1856 PT Time Calculation (min) (ACUTE ONLY): 27 min   Charges:   PT Evaluation $PT Eval Moderate Complexity: 1 Mod PT Treatments $Therapeutic Activity: 8-22 mins       [name] 11/08/2019, 8:17 PM  Samul Dada, PT MS Acute Rehab Dept. Number: [facility] [id number] and [facility] [phone]

## 2019-11-09 DIAGNOSIS — I5023 Acute on chronic systolic (congestive) heart failure: Secondary | ICD-10-CM | POA: Diagnosis not present

## 2019-11-09 LAB — CBC WITH DIFFERENTIAL/PLATELET
Abs Immature Granulocytes: 0.01 10*3/uL (ref 0.00–0.07)
Basophils Absolute: 0.1 10*3/uL (ref 0.0–0.1)
Basophils Relative: 1 %
Eosinophils Absolute: 0.2 10*3/uL (ref 0.0–0.5)
Eosinophils Relative: 3 %
HCT: 32 % — ABNORMAL LOW (ref 39.0–52.0)
Hemoglobin: 10.9 g/dL — ABNORMAL LOW (ref 13.0–17.0)
Immature Granulocytes: 0 %
Lymphocytes Relative: 12 %
Lymphs Abs: 0.7 10*3/uL (ref 0.7–4.0)
MCH: 31.7 pg (ref 26.0–34.0)
MCHC: 34.1 g/dL (ref 30.0–36.0)
MCV: 93 fL (ref 80.0–100.0)
Monocytes Absolute: 0.5 10*3/uL (ref 0.1–1.0)
Monocytes Relative: 9 %
Neutro Abs: 4.3 10*3/uL (ref 1.7–7.7)
Neutrophils Relative %: 75 %
Platelets: 186 10*3/uL (ref 150–400)
RBC: 3.44 MIL/uL — ABNORMAL LOW (ref 4.22–5.81)
RDW: 15.5 % (ref 11.5–15.5)
WBC: 5.8 10*3/uL (ref 4.0–10.5)
nRBC: 0 % (ref 0.0–0.2)

## 2019-11-09 LAB — BASIC METABOLIC PANEL
Anion gap: 6 (ref 5–15)
BUN: 15 mg/dL (ref 8–23)
CO2: 26 mmol/L (ref 22–32)
Calcium: 8.4 mg/dL — ABNORMAL LOW (ref 8.9–10.3)
Chloride: 102 mmol/L (ref 98–111)
Creatinine, Ser: 1.07 mg/dL (ref 0.61–1.24)
GFR, Estimated: >60 mL/min (ref >60)
Glucose, Bld: 113 mg/dL — ABNORMAL HIGH (ref 70–99)
Potassium: 3.7 mmol/L (ref 3.5–5.1)
Sodium: 134 mmol/L — ABNORMAL LOW (ref 135–145)

## 2019-11-09 LAB — GLUCOSE, CAPILLARY
Glucose-Capillary: 118 mg/dL — ABNORMAL HIGH (ref 70–99)
Glucose-Capillary: 225 mg/dL — ABNORMAL HIGH (ref 70–99)
Glucose-Capillary: 98 mg/dL (ref 70–99)
Glucose-Capillary: 168 mg/dL — ABNORMAL HIGH (ref 70–99)

## 2019-11-09 LAB — COOXEMETRY PANEL
Carboxyhemoglobin: 1.5 % (ref 0.5–1.5)
Methemoglobin: 0.7 % (ref 0.0–1.5)
O2 Saturation: 70.9 %
Total hemoglobin: 11.1 g/dL — ABNORMAL LOW (ref 12.0–16.0)

## 2019-11-09 LAB — MAGNESIUM: Magnesium: 1.8 mg/dL (ref 1.7–2.4)

## 2019-11-09 MED ORDER — MAGNESIUM SULFATE 2 GM/50ML IV SOLN
2.0000 g | Freq: Once | INTRAVENOUS | Status: AC
  Administered 2019-11-09: 2 g via INTRAVENOUS
  Filled 2019-11-09: qty 50

## 2019-11-09 MED ORDER — AMIODARONE HCL 200 MG PO TABS
200.0000 mg | ORAL_TABLET | Freq: Two times a day (BID) | ORAL | Status: DC
  Administered 2019-11-09 – 2019-11-10 (×2): 200 mg via ORAL
  Filled 2019-11-09 (×2): qty 1

## 2019-11-09 MED ORDER — POTASSIUM CHLORIDE CRYS ER 20 MEQ PO TBCR
40.0000 meq | EXTENDED_RELEASE_TABLET | Freq: Once | ORAL | Status: AC
  Administered 2019-11-09: 40 meq via ORAL
  Filled 2019-11-09: qty 2

## 2019-11-09 MED ORDER — METOLAZONE 5 MG PO TABS
2.5000 mg | ORAL_TABLET | Freq: Once | ORAL | Status: AC
  Administered 2019-11-09: 2.5 mg via ORAL
  Filled 2019-11-09: qty 1

## 2019-11-09 NOTE — Progress Notes (Signed)
Inpatient Rehab Admissions Coordinator Note:   Per PT recommendations, pt was screened for CIR candidacy by Estill Dooms, PT, DPT.  Pt does not currently have a qualifying diagnosis for CIR.  Will not request a consult at this time.  Please contact me with questions.   Estill Dooms, PT, DPT [phone] 11/09/19 12:08 PM

## 2019-11-09 NOTE — NC FL2 (Signed)
[place] MEDICAID FL2 LEVEL OF CARE SCREENING TOOL     IDENTIFICATION  Patient Name: [name] Birthdate: 08-29-1955 Sex: male Admission Date (Current Location): 11/04/2019  [facility] and [place] Number:  Producer, television/film/video and Address:  [facility], [address] [place], [place] [address]      Provider Number: [id number]  Attending Physician Name and Address:  Meredeth Ide, MD  Relative Name and Phone Number:  [name] [phone], [place] [phone]    Current Level of Care: Hospital Recommended Level of Care: Skilled Nursing Facility Prior Approval Number:    Date Approved/Denied:   PASRR Number: [id number]  Discharge Plan: SNF    Current Diagnoses: Patient Active Problem List   Diagnosis Date Noted  . Cardiogenic shock (HCC) 11/05/2019  . Hyponatremia 11/05/2019  . Hypothermia 11/05/2019  . Acute on chronic systolic CHF (congestive heart failure) (HCC) 11/04/2019  . NICM (nonischemic cardiomyopathy) (HCC) 07/16/2019  . Permanent atrial fibrillation (HCC)   . Acute systolic CHF (congestive heart failure) (HCC) 08/01/2017  . Acute CHF (congestive heart failure) (HCC) 08/01/2017  . Acute on chronic HFrEF (heart failure with reduced ejection fraction) (HCC)   . Persistent atrial fibrillation (HCC)   . Essential hypertension 07/15/2017  . Type 2 diabetes mellitus without complication, without long-term current use of insulin (HCC) 09/09/2016  . Acute systolic (congestive) heart failure (HCC) 09/07/2016  . Atrial flutter (HCC) 09/04/2016  . Cardiomyopathy (HCC) 06/02/2008  . ABNORMAL EKG 05/02/2008  . DIABETES MELLITUS, TYPE II 04/28/2008  . HLD (hyperlipidemia) 04/28/2008  . MIGRAINE HEADACHE 04/28/2008  . History of stroke 04/28/2008    Orientation RESPIRATION BLADDER Height & Weight     Self, Situation  Normal Incontinent (Urethral Catheter Coude 14 Fr.) Weight: 135 lb 1.6 oz (61.3 kg) Height:  5\' 7"  (170.2 cm)   BEHAVIORAL SYMPTOMS/MOOD NEUROLOGICAL BOWEL NUTRITION STATUS      Continent Diet (See Discharge Summary)  AMBULATORY STATUS COMMUNICATION OF NEEDS Skin   Limited Assist Verbally (Clear;Difficulty speaking) Normal                       Personal Care Assistance Level of Assistance  Bathing, Feeding, Dressing Bathing Assistance: Maximum assistance Feeding assistance: Maximum assistance (Needs assist) Dressing Assistance: Maximum assistance     Functional Limitations Info  Sight, Hearing, Speech Sight Info: Adequate Hearing Info: Adequate Speech Info: Impaired (Clear;Difficulty speaking)    SPECIAL CARE FACTORS FREQUENCY  PT (By licensed PT), OT (By licensed OT)     PT Frequency: 5x min weekly OT Frequency: 5x min weekly            Contractures Contractures Info: Not present    Additional Factors Info  Code Status, Insulin Sliding Scale Code Status Info: FULL Code     Insulin Sliding Scale Info: insulin aspart (novoLOG) injection 0-9 Units 3 times daily with meals,       Current Medications (11/09/2019):  This is the current hospital active medication list Current Facility-Administered Medications  Medication Dose Route Frequency Provider Last Rate Last Admin  . acetaminophen (TYLENOL) tablet 650 mg  650 mg Oral Q6H PRN 13/02/2019, MD   650 mg at 11/07/19 11/09/19   Or  . acetaminophen (TYLENOL) suppository 650 mg  650 mg Rectal Q6H PRN 9563, MD      . amiodarone (PACERONE) tablet 200 mg  200 mg Oral BID Lewie Chamber, MD   200 mg at 11/09/19 1054  .  apixaban (ELIQUIS) tablet 5 mg  5 mg Oral BID Lewie Chamber, MD   5 mg at 11/09/19 1054  . chlorhexidine (PERIDEX) 0.12 % solution 15 mL  15 mL Mouth Rinse BID Lewie Chamber, MD   15 mL at 11/09/19 1055  . Chlorhexidine Gluconate Cloth 2 % PADS 6 each  6 each Topical Daily Lewie Chamber, MD   6 each at 11/09/19 1000  . digoxin (LANOXIN) tablet 0.125 mg  0.125 mg Oral Daily Laurey Morale, MD   0.125  mg at 11/09/19 1055  . furosemide (LASIX) injection 80 mg  80 mg Intravenous BID Laurey Morale, MD   80 mg at 11/09/19 0930  . insulin aspart (novoLOG) injection 0-9 Units  0-9 Units Subcutaneous TID WC Lewie Chamber, MD   3 Units at 11/09/19 1215  . lidocaine (XYLOCAINE) 2 % jelly 1 application  1 application Urethral Once Lewie Chamber, MD      . lidocaine (XYLOCAINE) 2 % viscous mouth solution 15 mL  15 mL Mouth/Throat Q6H PRN Lewie Chamber, MD      . loratadine (CLARITIN) tablet 10 mg  10 mg Oral Daily PRN Lewie Chamber, MD      . MEDLINE mouth rinse  15 mL Mouth Rinse q12n4p Lewie Chamber, MD   15 mL at 11/09/19 1255  . milrinone (PRIMACOR) 20 MG/100 ML (0.2 mg/mL) infusion  0.25 mcg/kg/min Intravenous Continuous Leone Brand, NP 5.06 mL/hr at 11/08/19 2014 0.25 mcg/kg/min at 11/08/19 2014  . ondansetron (ZOFRAN) tablet 4 mg  4 mg Oral Q6H PRN Lewie Chamber, MD       Or  . ondansetron [facility]) injection 4 mg  4 mg Intravenous Q6H PRN Lewie Chamber, MD      . oxyCODONE (Oxy IR/ROXICODONE) immediate release tablet 5 mg  5 mg Oral Q4H PRN Lewie Chamber, MD      . simvastatin (ZOCOR) tablet 20 mg  20 mg Oral Daily Laurey Morale, MD   20 mg at 11/09/19 1054  . sodium chloride flush (NS) 0.9 % injection 10-40 mL  10-40 mL Intracatheter Q12H Laurey Morale, MD   10 mL at 11/09/19 0930  . sodium chloride flush (NS) 0.9 % injection 10-40 mL  10-40 mL Intracatheter PRN Laurey Morale, MD         Discharge Medications: Please see discharge summary for a list of discharge medications.  Relevant Imaging Results:  Relevant Lab Results:   Additional Information [id number]  Terrial Rhodes, LCSWA

## 2019-11-09 NOTE — Progress Notes (Signed)
Patient ID: [name], male   DOB: [birth year], 63 y.o.   MRN: [id number]     Advanced Heart Failure Rounding Note  PCP-Cardiologist: Verne Carrow, MD   Subjective:    No complaints this morning, denies dyspnea.  Eating breakfast.  Milrinone begun with co-ox 55%, now up to 71%.  BP remains soft in the 80s-90s systolic range but creatinine stable at 1.07.  He did not diurese as well yesterday but weight still trending down.  CVP 12 today.    HR 60s afib.   Paracentesis (10/31) with 4.8 L out.   Cardiac MRI: 1. Normal LV size with normal wall thickness. EF 24%, diffuse hypokinesis worse in the septum. 2. Mildly dilated RV with mildly decreased systolic function, EF 42%. 3.  Severe biatrial enlargement. 4. Extensive LGE in a non-coronary distribution throughout much of the LV, also involving RV free wall and left and right atrial walls. ECV 42%. This is suggestive of infiltrative disease. The walls are not thick, making amyloidosis and Fabry's less likely. Myocarditis is a consideration.   Objective:   Weight Range: 61.3 kg Body mass index is 21.16 kg/m.   Vital Signs:   Temp:  [98 F (36.7 C)-98.8 F (37.1 C)] 98.8 F (37.1 C) (11/02 0456) Pulse Rate:  [64-87] 67 (11/02 0456) Resp:  [16-20] 17 (11/02 0456) BP: (85-102)/(61-74) 91/65 (11/02 0456) SpO2:  [96 %-98 %] 98 % (11/02 0456) Weight:  [61.3 kg] 61.3 kg (11/02 0456)    Weight change: Filed Weights   11/06/19 2138 11/07/19 0553 11/09/19 0456  Weight: 67.8 kg 67.5 kg 61.3 kg    Intake/Output:   Intake/Output Summary (Last 24 hours) at 11/09/2019 0804 Last data filed at 11/09/2019 0453 Gross per 24 hour  Intake 1253.46 ml  Output 1200 ml  Net 53.46 ml      Physical Exam    General:  Well appearing. No resp difficulty HEENT: Normal Neck: Supple. JVP 10-12 cm. Carotids 2+ bilat; no bruits. No lymphadenopathy or thyromegaly appreciated. Cor: PMI nondisplaced. Irregular rate & rhythm. No  rubs, gallops or murmurs. Lungs: Clear Abdomen: Soft, nontender, nondistended. No hepatosplenomegaly. No bruits or masses. Good bowel sounds. Extremities: No cyanosis, clubbing, rash, edema Neuro: Alert & orientedx3, cranial nerves grossly intact. moves all 4 extremities w/o difficulty. Affect pleasant   Telemetry   Atrial fibrillation in 60s-70s (personally reviewed)  Labs    CBC Recent Labs    11/08/19 0553 11/09/19 0608  WBC 5.5 5.8  NEUTROABS 4.2 4.3  HGB 11.3* 10.9*  HCT 33.1* 32.0*  MCV 93.0 93.0  PLT 200 186   Basic Metabolic Panel Recent Labs    16/60/63 0553 11/09/19 0608  NA 135 134*  K 4.1 3.7  CL 103 102  CO2 24 26  GLUCOSE 116* 113*  BUN 13 15  CREATININE 1.02 1.07  CALCIUM 8.7* 8.4*  MG 2.0 1.8   Liver Function Tests Recent Labs    11/07/19 1547  AST 39  ALT 20  ALKPHOS 163*  BILITOT 2.0*  PROT 6.4*  ALBUMIN 3.2*   No results for input(s): LIPASE, AMYLASE in the last 72 hours. Cardiac Enzymes No results for input(s): CKTOTAL, CKMB, CKMBINDEX, TROPONINI in the last 72 hours.  BNP: BNP (last 3 results) Recent Labs    07/12/19 1913 11/04/19 1818  BNP 1,991.9* 1,651.0*    ProBNP (last 3 results) No results for input(s): PROBNP in the last 8760 hours.   D-Dimer No results for input(s): DDIMER in the last  72 hours. Hemoglobin A1C No results for input(s): HGBA1C in the last 72 hours. Fasting Lipid Panel No results for input(s): CHOL, HDL, LDLCALC, TRIG, CHOLHDL, LDLDIRECT in the last 72 hours. Thyroid Function Tests No results for input(s): TSH, T4TOTAL, T3FREE, THYROIDAB in the last 72 hours.  Invalid input(s): FREET3  Other results:   Imaging    MR CARDIAC MORPHOLOGY W WO CONTRAST  Result Date: 11/08/2019 CLINICAL DATA:  Cardiomyopathy of uncertain etiology EXAM: CARDIAC MRI TECHNIQUE: The patient was scanned on a 1.5 Tesla GE magnet. A dedicated cardiac coil was used. Functional imaging was done using Fiesta sequences.  2,3, and 4 chamber views were done to assess for RWMA's. Modified Simpson's rule using a short axis stack was used to calculate an ejection fraction on a dedicated work [employer]. The patient received 8 cc of Gadavist. After 10 minutes inversion recovery sequences were used to assess for infiltration and scar tissue. FINDINGS: Small right pleural effusion. Trivial pericardial effusion. Normal left ventricular size and wall thickness. Diffuse hypokinesis worse in the septum with EF 24%. Mildly dilated RV with mildly decreased systolic function, EF 42%. Severe biatrial enlargement. There is at least moderate tricuspid regurgitation. Mild mitral regurgitation. Trileaflet aortic valve with no stenosis, mild regurgitation. Delayed enhancement imaging: Extensive, primarily mid-wall and subepicardial late gadolinium enhancement (LGE) throughout a large portion of the left ventricle. It also appears to involve the RV free wall and the atrial walls/interatrial septum. Measurements: LVEDV 133 mL LVSV 33 mL LVEF 24% RVEDV 198 mL RVSV 82 mL RVEF 42% Extracellular volume percentage 41% IMPRESSION: 1. Normal LV size with normal wall thickness. EF 24%, diffuse hypokinesis worse in the septum. 2. Mildly dilated RV with mildly decreased systolic function, EF 42%. 3.  Severe biatrial enlargement. 4. Extensive LGE in a non-coronary distribution throughout much of the LV, also involving RV free wall and left and right atrial walls. ECV 42%. This is suggestive of infiltrative disease. The walls are not thick, making amyloidosis and Fabry's less likely. Myocarditis is a consideration. Deny Chevez Electronically Signed   By: Marca Ancona M.D.   On: 11/08/2019 13:49   VAS [name] UPPER EXTREMITY VENOUS DUPLEX  Result Date: 11/08/2019 UPPER VENOUS STUDY  Indications: Edema Comparison Study: No prior study Performing Technologist: Gertie Fey MHA, RDMS, RVT, RDCS  Examination Guidelines: A complete evaluation  includes B-mode imaging, spectral Doppler, color Doppler, and power Doppler as needed of all accessible portions of each vessel. Bilateral testing is considered an integral part of a complete examination. Limited examinations for reoccurring indications may be performed as noted.  Right Findings: +----------+------------+---------+-----------+----------+-------+ RIGHT     CompressiblePhasicitySpontaneousPropertiesSummary +----------+------------+---------+-----------+----------+-------+ IJV           Full       No        Yes                      +----------+------------+---------+-----------+----------+-------+ Subclavian    Full       No        Yes                      +----------+------------+---------+-----------+----------+-------+ Axillary      Full       No        Yes                      +----------+------------+---------+-----------+----------+-------+ Brachial      Full  No        Yes                      +----------+------------+---------+-----------+----------+-------+ Radial        Full                                          +----------+------------+---------+-----------+----------+-------+ Ulnar         Full                                          +----------+------------+---------+-----------+----------+-------+ Cephalic      Full                                          +----------+------------+---------+-----------+----------+-------+ Basilic       Full                                          +----------+------------+---------+-----------+----------+-------+  Left Findings: +----------+------------+---------+-----------+----------+-------+ LEFT      CompressiblePhasicitySpontaneousPropertiesSummary +----------+------------+---------+-----------+----------+-------+ Subclavian               No        Yes                      +----------+------------+---------+-----------+----------+-------+  Summary:  Right: No  evidence of deep vein thrombosis in the upper extremity. No evidence of superficial vein thrombosis in the upper extremity.  Left: No evidence of thrombosis in the subclavian.  Upper extremity venous flow is pulsatile, suggestive of possibly elevated right sided heart pressure. *See table(s) above for measurements and observations.  Diagnosing physician: Fabienne Bruns MD Electronically signed by Fabienne Bruns MD on 11/08/2019 at 7:09:36 PM.    Final      Medications:     Scheduled Medications: . amiodarone  200 mg Oral BID  . apixaban  5 mg Oral BID  . chlorhexidine  15 mL Mouth Rinse BID  . Chlorhexidine Gluconate Cloth  6 each Topical Daily  . digoxin  0.125 mg Oral Daily  . furosemide  80 mg Intravenous BID  . insulin aspart  0-9 Units Subcutaneous TID WC  . lidocaine  1 application Urethral Once  . mouth rinse  15 mL Mouth Rinse q12n4p  . metolazone  2.5 mg Oral Once  . potassium chloride  40 mEq Oral Once  . simvastatin  20 mg Oral Daily  . sodium chloride flush  10-40 mL Intracatheter Q12H    Infusions: . magnesium sulfate bolus IVPB    . milrinone 0.25 mcg/kg/min (11/08/19 2014)    PRN Medications: acetaminophen **OR** acetaminophen, lidocaine, loratadine, ondansetron **OR** ondansetron (ZOFRAN) IV, oxyCODONE, sodium chloride flush   Assessment/Plan   1. Acute on chronic systolic CHF: Nonischemic cardiomyopathy known since 2018.  Improved initially in 2018 with DCCV, but has been back in atrial fibrillation since 2019 it appears and EF has been low.  I reviewed echo this admission, EF about 20% with mod-severe RV dilation and moderate RV dysfunction, D-shaped septum, dilated IVC, severe biatrial enlargement. Significant RV failure with ascites on CT  abdomen, had paracentesis.  Cardiac MRI showed LV EF 24%, RV mildly dilated with EF 42%, extensive LGE in a non-coronary distribution throughout much of the LV, also involving RV free wall and left and right atrial walls, ECV  42%. This is suggestive of infiltrative disease. The walls are not thick, making amyloidosis and Fabry's less likely. Myocarditis is a consideration. With SBP 90s and inability to tolerate GDMT, concerned for low output HF.  PICC placed, co-ox 55% and milrinone 0.25 started.  This morning, co-ox 71%.  CVP still 12, did not diurese as well yesterday.  Creatinine remains stable.    - Continue milrinone 0.25 today - Continue digoxin.   - BP too soft for other GDMT right now.  - Lasix 80 mg IV bid again today, will give 1 dose metolazone 2.5 and replace K and Mg.  - Ideally will attempt DCCV, he initially improved in NSR back in 2018.  However, not in RVR so doubt this is primarily a tachy-mediated CMP.  - With extensive scarring on cardiac MRI, will arrange PYP scan today and draw urine IFXN and myeloma panel for amyloidosis workup. - Will need a formal RHC when more diuresed.   - Narrow QRS, not CRT candidate.  2. Atrial fibrillation: Now chronic.  Initially in 2018, EF improved after DCCV.  However, as above, he is not in RVR so not sure that cardiomyopathy is primarily due to AF.   - Continue Eliquis, has not missed doses.  - HR tolerating amiodarone, can increase to bid.  - Will arrange for DCCV tomorrow, will try to start milrinone weaning prior.  DIscussed risks/benefits with patient and his niece, they agree to procedure.   3. Ascites: Large ascites on CT.  Suspect due to RV failure. Paracentesis on 10/31 with 4.8 L out.  4. H/o CVA: In 2010, with expressive aphasia and right-sided weakness.  Per niece, he is at baseline.  - PT/OT.  5. Hyponatremia: Hypervolemic hypernatremia => resolved with 1 dose tolvaptan. Na 134 today, follow closely.  Fluid restrict.   Discussed the above with niece Colin Rhein today.   Length of Stay: 4  Marca Ancona, MD  11/09/2019, 8:04 AM  Advanced Heart Failure Team Pager [phone] (M-F; 7a - 4p)  Please contact [facility] for night-coverage after  hours (4p -7a ) and weekends on [email or web address]

## 2019-11-09 NOTE — TOC Initial Note (Signed)
Transition of Care [facility]) - Initial/Assessment Note    Patient Details  Name: [name] MRN: [id number] Date of Birth: 1955/07/04  Transition of Care [facility]) CM/SW Contact:    Terrial Rhodes, LCSWA Phone Number: 11/09/2019, 2:21 PM  Clinical Narrative:                   CSW received consult for possible SNF placement at time of discharge. CSW spoke with patient at bedside who gave CSW permission to call family  regarding PT recommendation of SNF placement at time of discharge. CSW called patients Niece Gabriel Rainwater who said she would like for CSW to call patients brother [name] in regards to decision on SNF placement for patient. CSW spoke with patients brother [name] who expressed understanding of PT recommendation and is agreeable to SNF placement at time of discharge. Patients brother would like for CSW to fax out initial referral near [place] area. CSW discussed insurance authorization process.Patient has received COVID vaccines.Patient expressed being hopeful for rehab and to feel better soon. No further questions reported at this time. CSW to continue to follow and assist with discharge planning needs.  Expected Discharge Plan: Skilled Nursing Facility Barriers to Discharge: Continued Medical Work up   Patient Goals and CMS Choice   CMS Medicare.gov Compare Post Acute Care list provided to:: Patient Represenative (must comment) [name] Brother) Choice offered to / list presented to : Sibling [name] Brother)  Expected Discharge Plan and Services Expected Discharge Plan: Skilled Nursing Facility       Living arrangements for the past 2 months: Single Family Home                                      Prior Living Arrangements/Services Living arrangements for the past 2 months: Single Family Home Lives with:: Self Patient language and need for interpreter reviewed:: Yes Do you feel safe going back to the place where you live?: No   SNF  Need for Family Participation  in Patient Care: Yes (Comment) Care giver support system in place?: Yes (comment)   Criminal Activity/Legal Involvement Pertinent to Current Situation/Hospitalization: No - Comment as needed  Activities of Daily Living Home Assistive Devices/Equipment: Built-in shower seat, Blood pressure cuff, Cane (specify quad or straight), Walker (specify type), Hospital bed (single point cane, front wheeled walker) ADL Screening (condition at time of admission) Patient's cognitive ability adequate to safely complete daily activities?: No (patient drowsy and mumbling) Is the patient deaf or have difficulty hearing?: No Does the patient have difficulty seeing, even when wearing glasses/contacts?: No Does the patient have difficulty concentrating, remembering, or making decisions?: Yes Patient able to express need for assistance with ADLs?: Yes Does the patient have difficulty dressing or bathing?: Yes Independently performs ADLs?: No Communication: Independent Dressing (OT): Needs assistance Is this a change from baseline?: Change from baseline, expected to last >3 days Grooming: Needs assistance Is this a change from baseline?: Change from baseline, expected to last >3 days Feeding: Needs assistance Is this a change from baseline?: Change from baseline, expected to last >3 days Bathing: Needs assistance Is this a change from baseline?: Change from baseline, expected to last >3 days Toileting: Needs assistance Is this a change from baseline?: Pre-admission baseline In/Out Bed: Needs assistance Is this a change from baseline?: Pre-admission baseline Walks in Home: Needs assistance Is this a change from baseline?: Pre-admission baseline Does the patient have difficulty  walking or climbing stairs?: Yes (secondary to weakness) Weakness of Legs: Both (R>L) Weakness of Arms/Hands: Both (R>L)  Permission Sought/Granted Permission sought to share information with : Case Manager, Family Supports,  Magazine features editor    Share Information with NAME: Lyn Hollingshead  Permission granted to share info w AGENCY: SNF  Permission granted to share info w Relationship: niece,brother  Permission granted to share info w Contact Information: Gabriel Rainwater [phone]  Emotional Assessment Appearance:: Appears stated age Attitude/Demeanor/Rapport: Gracious Affect (typically observed): Calm Orientation: : Oriented to Self, Oriented to Situation Alcohol / Substance Use: Not Applicable Psych Involvement: No (comment)  Admission diagnosis:  CHF (congestive heart failure) (HCC) [I50.9] Acute on chronic congestive heart failure, unspecified heart failure type [facility]) [I50.9] Patient Active Problem List   Diagnosis Date Noted   Cardiogenic shock (HCC) 11/05/2019   Hyponatremia 11/05/2019   Hypothermia 11/05/2019   Acute on chronic systolic CHF (congestive heart failure) (HCC) 11/04/2019   NICM (nonischemic cardiomyopathy) (HCC) 07/16/2019   Permanent atrial fibrillation (HCC)    Acute systolic CHF (congestive heart failure) (HCC) 08/01/2017   Acute CHF (congestive heart failure) (HCC) 08/01/2017   Acute on chronic HFrEF (heart failure with reduced ejection fraction) (HCC)    Persistent atrial fibrillation (HCC)    Essential hypertension 07/15/2017   Type 2 diabetes mellitus without complication, without long-term current use of insulin (HCC) 09/09/2016   Acute systolic (congestive) heart failure (HCC) 09/07/2016   Atrial flutter (HCC) 09/04/2016   Cardiomyopathy (HCC) 06/02/2008   ABNORMAL EKG 05/02/2008   DIABETES MELLITUS, TYPE II 04/28/2008   HLD (hyperlipidemia) 04/28/2008   MIGRAINE HEADACHE 04/28/2008   History of stroke 04/28/2008   PCP:  Mirna Mires, MD Pharmacy:   [facility] [phone] - [place], [place] - [address] [place] [address] Phone: [phone] Fax:  [phone]     Social Determinants of Health (SDOH) Interventions    Readmission Risk Interventions No flowsheet data found.

## 2019-11-09 NOTE — Progress Notes (Signed)
Physical Therapy Treatment Patient Details Name: [name] MRN: [id number] DOB: 22-Aug-1955 Today's Date: 11/09/2019    History of Present Illness 64 yo male with onset of acute CHF was admitted with SOB and inability to move without SOB.  Pt received a paracentesis, removing 4.8L O2.  Pt is referred to PT for mobility assessment.  PMHx:  EF 20-25%, a-flutter, CHF, DM, HTN, NICM, stroke with R hemiparesis    PT Comments    Pt supine in bed on arrival.  He is minimally conversive but follows commands well.  Performed sit to stands at edge of bed and steps away from bed and back to bed. CIR has denied patient due to lack of qualifying diagnosis.  Based on need for min - mod assistance will update recommendations to SNF at this time.  Pt seated in bed in chair position with alarm set.  Will plan to try hemi walker vs.  Platform walker next session to progress gt to hall.      Follow Up Recommendations  SNF;Supervision/Assistance - 24 hour     Equipment Recommendations  None recommended by PT    Recommendations for Other Services       Precautions / Restrictions Precautions Precautions: Fall Precaution Comments: requires assist for all movement    Mobility  Bed Mobility Overal bed mobility: Needs Assistance Bed Mobility: Supine to Sit;Sit to Supine     Supine to sit: Min assist Sit to supine: Supervision   General bed mobility comments: Heavy use of rail on L side to pull into long sitting and advance LEs to edge of bed.  Pt able to move back to bed with out assistance.  Transfers Overall transfer level: Needs assistance Equipment used:  (utilized back of chair on L side to achieve standing trials.) Transfers: Sit to/from Stand Sit to Stand: Mod assist         General transfer comment: Cues for hand placement and blocking assist for R knee.  Ambulation/Gait Ambulation/Gait assistance: Mod assist Gait Distance (Feet): 10 Feet (series of steps forward and back away  from bed and back to bed equalling 10 ft.) Assistive device:  (holding chair with L hand for support.) Gait Pattern/deviations: Step-to pattern;Trunk flexed     General Gait Details: assistance to weight shift and block R knee in stance phase.  Performed steps away from and back to bed holding back of chair on L side for support.   Stairs             Wheelchair Mobility    Modified Rankin (Stroke Patients Only)       Balance Overall balance assessment: Needs assistance Sitting-balance support: Feet supported;Bilateral upper extremity supported Sitting balance-Leahy Scale: Fair       Standing balance-Leahy Scale: Poor                              Cognition Arousal/Alertness: Awake/alert Behavior During Therapy: Impulsive Overall Cognitive Status: Difficult to assess                                 General Comments: Pt responds yes/no but unsure if he is answering correctly.  Poor ability to obtain mobility history on.      Exercises      General Comments        Pertinent Vitals/Pain Pain Assessment: Faces Faces Pain Scale: Hurts a little bit  Pain Location: soreness of stretching R side Pain Descriptors / Indicators: Discomfort Pain Intervention(s): Monitored during session;Repositioned    Home Living                      Prior Function            PT Goals (current goals can now be found in the care plan section) Acute Rehab PT Goals Patient Stated Goal: wanted a cup of coffee. Potential to Achieve Goals: Good Progress towards PT goals: Progressing toward goals    Frequency    Min 3X/week      PT Plan Discharge plan needs to be updated    Co-evaluation              AM-PAC PT "6 Clicks" Mobility   Outcome Measure  Help needed turning from your back to your side while in a flat bed without using bedrails?: A Little Help needed moving from lying on your back to sitting on the side of a flat bed  without using bedrails?: A Little Help needed moving to and from a bed to a chair (including a wheelchair)?: A Lot Help needed standing up from a chair using your arms (e.g., wheelchair or bedside chair)?: A Lot Help needed to walk in hospital room?: A Lot Help needed climbing 3-5 steps with a railing? : Total 6 Click Score: 13    End of Session Equipment Utilized During Treatment: Gait belt Activity Tolerance: Patient limited by fatigue;Treatment limited secondary to medical complications (Comment) Patient left: in bed;with call bell/phone within reach;with bed alarm set (in chair position.) Nurse Communication: Mobility status PT Visit Diagnosis: Unsteadiness on feet (R26.81);Muscle weakness (generalized) (M62.81);Hemiplegia and hemiparesis Hemiplegia - Right/Left: Right Hemiplegia - dominant/non-dominant: Dominant     Time: 1212-1239 PT Time Calculation (min) (ACUTE ONLY): 27 min  Charges:  $Gait Training: 8-22 mins $Therapeutic Activity: 8-22 mins                     Bonney Leitz , PTA Acute Rehabilitation Services Pager [phone] Office [phone]     [place] Maiolo Artis Delay 11/09/2019, 12:57 PM

## 2019-11-09 NOTE — Progress Notes (Signed)
[facility]  PROGRESS NOTE  Tai Skelly KGU:[id number] DOB: 16-Aug-1955 DOA: 11/04/2019 PCP: Mirna Mires, MD   Brief HPI:   64 year old male with a history of stroke with expressive aphasia, right-sided paralysis, nonischemic cardiomyopathy, systolic CHF EF 20 to 25% as of April 2021 echo, hypertension, diabetes mellitus type 2, persistent atrial flutter/atrial fibrillation came to ED with chief complaints of dyspnea.  Patient became progressively more dyspneic at home.  He had been ambulating with cane and started developing worsening distention/swelling in the abdomen.  His Lasix dosing was increased outpatient per his cardiologist with minimal improvement.  Due to progressively worsening dyspnea and abdominal swelling he was brought to the ED for further evaluation. He was found to have elevated BNP, indeterminate troponins.  Initially started on Lasix for diuresis for abdominal ascites.  He became more hypotensive and bradycardic requiring discontinuation of Lasix.  Cardiology was consulted for concern of development cardiogenic shock.  Cardiology recommended transfer to [facility] for evaluation by hospice failure team.  Entresto, metoprolol, Lasix are currently on hold.    Subjective   Patient seen and examined, denies shortness of breath or chest pain.   Assessment/Plan:     1. Acute on chronic systolic CHF-echo in 2021 showed EF 20 to 25%, repeat echo done during this hospitalization showed EF of 15 to 20%.  BNP 1651.  Initially started on Lasix however became bradycardic and hypotensive so Lasix was held.  Likely cardiogenic shock.  Heart failure team consulted.  Patient started on milrinone and IV Lasix. 2. Ascites-s/p paracentesis on 11/07/2019 with 4.8 liters fluid removal.  Likely from right heart failure. 3. Cardiogenic shock-patient was started on IV Lasix however became bradycardic and hypotensive, Lasix was held and patient was transferred to [facility] for  evaluation by heart failure team.  At this time he is both on milrinone as well as IV Lasix.  Heart failure team following. 4. Hyponatremia-resolved, secondary likely hypervolemic hyponatremia from underlying CHF.  Sodium has improved today to 135.  He received 1 dose of tolvaptan yesterday.  Continue diuresis with inotropic support as above.  We will follow serum sodium level in a.m.   5. Persistent atrial fibrillation-beta-blockers on hold due to hypotension.  Continue digoxin, amiodarone, Eliquis.  Cardiology planning DCCV tomorrow. 6. Diabetes mellitus type 2-patient was hypoglycemic on admission, continue hypoglycemic protocol.  Sliding scale insulin with NovoLog. 7. History of stroke-residual severe expressive aphasia, has right-sided paralysis     COVID-19 Labs  No results for input(s): DDIMER, FERRITIN, LDH, CRP in the last 72 hours.  Lab Results  Component Value Date   SARSCOV2NAA NEGATIVE 11/04/2019   SARSCOV2NAA NEGATIVE 07/13/2019     Scheduled medications:   . amiodarone  200 mg Oral BID  . apixaban  5 mg Oral BID  . chlorhexidine  15 mL Mouth Rinse BID  . Chlorhexidine Gluconate Cloth  6 each Topical Daily  . digoxin  0.125 mg Oral Daily  . furosemide  80 mg Intravenous BID  . insulin aspart  0-9 Units Subcutaneous TID WC  . lidocaine  1 application Urethral Once  . mouth rinse  15 mL Mouth Rinse q12n4p  . simvastatin  20 mg Oral Daily  . sodium chloride flush  10-40 mL Intracatheter Q12H         CBG: Recent Labs  Lab 11/08/19 1303 11/08/19 1705 11/08/19 2214 11/09/19 0821 11/09/19 1127  GLUCAP 181* 89 131* 118* 225*    SpO2: 98 % O2 Flow Rate (L/min): 1  L/min    CBC: Recent Labs  Lab 11/04/19 1818 11/05/19 0422 11/06/19 0526 11/08/19 0553 11/09/19 0608  WBC 5.6 5.7 6.1 5.5 5.8  NEUTROABS 4.2 4.5 4.7 4.2 4.3  HGB 13.4 12.3* 12.4* 11.3* 10.9*  HCT 38.7* 35.6* 36.9* 33.1* 32.0*  MCV 93.0 93.0 93.4 93.0 93.0  PLT 216 211 213 200 186     Basic Metabolic Panel: Recent Labs  Lab 11/04/19 1818 11/04/19 1818 11/05/19 0422 11/06/19 0526 11/07/19 2103 11/08/19 0553 11/09/19 0608  NA 124*   < > 126* 126* 134* 135 134*  K 5.6*  --  5.0 5.2*  --  4.1 3.7  CL 92*  --  91* 95*  --  103 102  CO2 21*  --  19* 20*  --  24 26  GLUCOSE 63*  --  61* 95  --  116* 113*  BUN 24*  --  25* 20  --  13 15  CREATININE 1.22  --  1.23 1.10  --  1.02 1.07  CALCIUM 9.6  --  9.5 9.2  --  8.7* 8.4*  MG  --   --  2.0 2.3  --  2.0 1.8   < > = values in this interval not displayed.     Liver Function Tests: Recent Labs  Lab 11/04/19 1818 11/07/19 1547  AST 35 39  ALT 17 20  ALKPHOS 206* 163*  BILITOT 1.8* 2.0*  PROT 8.4* 6.4*  ALBUMIN 4.3 3.2*     Antibiotics: Anti-infectives (From admission, onward)   None       DVT prophylaxis: Eliquis  Code Status: Full code  Family Communication: No family at bedside   Consultants:  Cardiology  Procedures:      Objective   Vitals:   11/08/19 2041 11/09/19 0040 11/09/19 0043 11/09/19 0456  BP: 91/62 (!) 86/61  91/65  Pulse: 66  70 67  Resp: 20 20  17   Temp: 98 F (36.7 C) 98 F (36.7 C)  98.8 F (37.1 C)  TempSrc: Oral Oral  Oral  SpO2: 98%  98% 98%  Weight:    61.3 kg  Height:        Intake/Output Summary (Last 24 hours) at 11/09/2019 1511 Last data filed at 11/09/2019 0453 Gross per 24 hour  Intake 1160.92 ml  Output 1200 ml  Net -39.08 ml    10/31 1901 - 11/02 0700 In: 1393.5 [P.O.:1200; I.V.:193.5] Out: 4800 [Urine:4800]  Filed Weights   11/06/19 2138 11/07/19 0553 11/09/19 0456  Weight: 67.8 kg 67.5 kg 61.3 kg    Physical Examination:  General-appears in no acute distress Heart-S1-S2, regular, no murmur auscultated Lungs-clear to auscultation bilaterally, no wheezing or crackles auscultated Abdomen-soft, nontender, no organomegaly Extremities-no edema in the lower extremities Neuro-alert, oriented x3, expressive aphasia  Status is:  Inpatient  Dispo: The patient is from: Home              Anticipated d/c is to: Home versus skilled nursing facility              Anticipated d/c date is: 11/12/2019              Patient currently not medically stable for discharge  Barrier to discharge-treated for acute on chronic systolic CHF      Data Reviewed:   Recent Results (from the past 240 hour(s))  Respiratory Panel by RT PCR (Flu A&B, Covid) - Nasopharyngeal Swab     Status: None   Collection Time: 11/04/19  6:21 PM   Specimen: Nasopharyngeal Swab  Result Value Ref Range Status   SARS Coronavirus 2 by RT PCR NEGATIVE NEGATIVE Final    Comment: (NOTE) SARS-CoV-2 target nucleic acids are NOT DETECTED.  The SARS-CoV-2 RNA is generally detectable in upper respiratoy specimens during the acute phase of infection. The lowest concentration of SARS-CoV-2 viral copies this assay can detect is 131 copies/mL. A negative result does not preclude SARS-Cov-2 infection and should not be used as the sole basis for treatment or other patient management decisions. A negative result may occur with  improper specimen collection/handling, submission of specimen other than nasopharyngeal swab, presence of viral mutation(s) within the areas targeted by this assay, and inadequate number of viral copies (<131 copies/mL). A negative result must be combined with clinical observations, patient history, and epidemiological information. The expected result is Negative.  Fact Sheet for Patients:  https://www.moore.com/  Fact Sheet for Healthcare Providers:  https://www.young.biz/  This test is no t yet approved or cleared by the [place] FDA and  has been authorized for detection and/or diagnosis of SARS-CoV-2 by FDA under an Emergency Use Authorization (EUA). This EUA will remain  in effect (meaning this test can be used) for the duration of the COVID-19 declaration under Section 564(b)(1) of the  Act, 21 U.S.C. section 360bbb-3(b)(1), unless the authorization is terminated or revoked sooner.     Influenza A by PCR NEGATIVE NEGATIVE Final   Influenza B by PCR NEGATIVE NEGATIVE Final    Comment: (NOTE) The Xpert Xpress SARS-CoV-2/FLU/RSV assay is intended as an aid in  the diagnosis of influenza from Nasopharyngeal swab specimens and  should not be used as a sole basis for treatment. Nasal washings and  aspirates are unacceptable for Xpert Xpress SARS-CoV-2/FLU/RSV  testing.  Fact Sheet for Patients: https://www.moore.com/  Fact Sheet for Healthcare Providers: https://www.young.biz/  This test is not yet approved or cleared by the [place] FDA and  has been authorized for detection and/or diagnosis of SARS-CoV-2 by  FDA under an Emergency Use Authorization (EUA). This EUA will remain  in effect (meaning this test can be used) for the duration of the  Covid-19 declaration under Section 564(b)(1) of the Act, 21  U.S.C. section 360bbb-3(b)(1), unless the authorization is  terminated or revoked. Performed at [facility], [address], [place], [place] [address]   Group A Strep by PCR     Status: None   Collection Time: 11/04/19  6:45 PM   Specimen: Throat; Sterile Swab  Result Value Ref Range Status   Group A Strep by PCR NOT DETECTED NOT DETECTED Final    Comment: Performed at [facility], [address], [place], [place] [address]    No results for input(s): LIPASE, AMYLASE in the last 168 hours. No results for input(s): AMMONIA in the last 168 hours.  Cardiac Enzymes: No results for input(s): CKTOTAL, CKMB, CKMBINDEX, TROPONINI in the last 168 hours. BNP (last 3 results) Recent Labs    07/12/19 1913 11/04/19 1818  BNP 1,991.9* 1,651.0*    ProBNP (last 3 results) No results for input(s): PROBNP in the last 8760 hours.  Studies:  MR CARDIAC MORPHOLOGY W WO CONTRAST  Result  Date: 11/08/2019 CLINICAL DATA:  Cardiomyopathy of uncertain etiology EXAM: CARDIAC MRI TECHNIQUE: The patient was scanned on a 1.5 Tesla GE magnet. A dedicated cardiac coil was used. Functional imaging was done using Fiesta sequences. 2,3, and 4 chamber views were done to assess for RWMA's. Modified  Simpson's rule using a short axis stack was used to calculate an ejection fraction on a dedicated work [employer]. The patient received 8 cc of Gadavist. After 10 minutes inversion recovery sequences were used to assess for infiltration and scar tissue. FINDINGS: Small right pleural effusion. Trivial pericardial effusion. Normal left ventricular size and wall thickness. Diffuse hypokinesis worse in the septum with EF 24%. Mildly dilated RV with mildly decreased systolic function, EF 42%. Severe biatrial enlargement. There is at least moderate tricuspid regurgitation. Mild mitral regurgitation. Trileaflet aortic valve with no stenosis, mild regurgitation. Delayed enhancement imaging: Extensive, primarily mid-wall and subepicardial late gadolinium enhancement (LGE) throughout a large portion of the left ventricle. It also appears to involve the RV free wall and the atrial walls/interatrial septum. Measurements: LVEDV 133 mL LVSV 33 mL LVEF 24% RVEDV 198 mL RVSV 82 mL RVEF 42% Extracellular volume percentage 41% IMPRESSION: 1. Normal LV size with normal wall thickness. EF 24%, diffuse hypokinesis worse in the septum. 2. Mildly dilated RV with mildly decreased systolic function, EF 42%. 3.  Severe biatrial enlargement. 4. Extensive LGE in a non-coronary distribution throughout much of the LV, also involving RV free wall and left and right atrial walls. ECV 42%. This is suggestive of infiltrative disease. The walls are not thick, making amyloidosis and Fabry's less likely. Myocarditis is a consideration. Dalton Mclean Electronically Signed   By: Marca Ancona M.D.   On: 11/08/2019 13:49   VAS [name] UPPER  EXTREMITY VENOUS DUPLEX  Result Date: 11/08/2019 UPPER VENOUS STUDY  Indications: Edema Comparison Study: No prior study Performing Technologist: Gertie Fey MHA, RDMS, RVT, RDCS  Examination Guidelines: A complete evaluation includes B-mode imaging, spectral Doppler, color Doppler, and power Doppler as needed of all accessible portions of each vessel. Bilateral testing is considered an integral part of a complete examination. Limited examinations for reoccurring indications may be performed as noted.  Right Findings: +----------+------------+---------+-----------+----------+-------+ RIGHT     CompressiblePhasicitySpontaneousPropertiesSummary +----------+------------+---------+-----------+----------+-------+ IJV           Full       No        Yes                      +----------+------------+---------+-----------+----------+-------+ Subclavian    Full       No        Yes                      +----------+------------+---------+-----------+----------+-------+ Axillary      Full       No        Yes                      +----------+------------+---------+-----------+----------+-------+ Brachial      Full       No        Yes                      +----------+------------+---------+-----------+----------+-------+ Radial        Full                                          +----------+------------+---------+-----------+----------+-------+ Ulnar         Full                                          +----------+------------+---------+-----------+----------+-------+  Cephalic      Full                                          +----------+------------+---------+-----------+----------+-------+ Basilic       Full                                          +----------+------------+---------+-----------+----------+-------+  Left Findings: +----------+------------+---------+-----------+----------+-------+ LEFT       CompressiblePhasicitySpontaneousPropertiesSummary +----------+------------+---------+-----------+----------+-------+ Subclavian               No        Yes                      +----------+------------+---------+-----------+----------+-------+  Summary:  Right: No evidence of deep vein thrombosis in the upper extremity. No evidence of superficial vein thrombosis in the upper extremity.  Left: No evidence of thrombosis in the subclavian.  Upper extremity venous flow is pulsatile, suggestive of possibly elevated right sided heart pressure. *See table(s) above for measurements and observations.  Diagnosing physician: Fabienne Bruns MD Electronically signed by Fabienne Bruns MD on 11/08/2019 at 7:09:36 PM.    Final        Meredeth Ide   [facility] If 7PM-7AM, please contact night-coverage at [email or web address], Office  [phone]   11/09/2019, 3:11 PM  LOS: 4 days

## 2019-11-10 ENCOUNTER — Inpatient Hospital Stay (HOSPITAL_COMMUNITY): Payer: Medicaid Other

## 2019-11-10 ENCOUNTER — Encounter (HOSPITAL_COMMUNITY)
Admission: EM | Disposition: A | Discharge status: Hospice | Payer: Self-pay | Source: Home / Self Care | Attending: Family Medicine

## 2019-11-10 ENCOUNTER — Inpatient Hospital Stay (HOSPITAL_COMMUNITY): Payer: Medicaid Other | Admitting: Anesthesiology

## 2019-11-10 ENCOUNTER — Encounter (HOSPITAL_COMMUNITY): Payer: Self-pay | Admitting: Internal Medicine

## 2019-11-10 DIAGNOSIS — I4891 Unspecified atrial fibrillation: Secondary | ICD-10-CM | POA: Diagnosis not present

## 2019-11-10 DIAGNOSIS — I5023 Acute on chronic systolic (congestive) heart failure: Secondary | ICD-10-CM | POA: Diagnosis not present

## 2019-11-10 HISTORY — PX: CARDIOVERSION: SHX1299

## 2019-11-10 LAB — CBC WITH DIFFERENTIAL/PLATELET
Abs Immature Granulocytes: 0.01 10*3/uL (ref 0.00–0.07)
Basophils Absolute: 0 10*3/uL (ref 0.0–0.1)
Basophils Relative: 1 %
Eosinophils Absolute: 0.3 10*3/uL (ref 0.0–0.5)
Eosinophils Relative: 4 %
HCT: 33.1 % — ABNORMAL LOW (ref 39.0–52.0)
Hemoglobin: 11.1 g/dL — ABNORMAL LOW (ref 13.0–17.0)
Immature Granulocytes: 0 %
Lymphocytes Relative: 13 %
Lymphs Abs: 0.7 10*3/uL (ref 0.7–4.0)
MCH: 31.3 pg (ref 26.0–34.0)
MCHC: 33.5 g/dL (ref 30.0–36.0)
MCV: 93.2 fL (ref 80.0–100.0)
Monocytes Absolute: 0.5 10*3/uL (ref 0.1–1.0)
Monocytes Relative: 8 %
Neutro Abs: 4.4 10*3/uL (ref 1.7–7.7)
Neutrophils Relative %: 74 %
Platelets: 200 10*3/uL (ref 150–400)
RBC: 3.55 MIL/uL — ABNORMAL LOW (ref 4.22–5.81)
RDW: 15.2 % (ref 11.5–15.5)
WBC: 5.9 10*3/uL (ref 4.0–10.5)
nRBC: 0 % (ref 0.0–0.2)

## 2019-11-10 LAB — MULTIPLE MYELOMA PANEL, SERUM
Albumin SerPl Elph-Mcnc: 3.3 g/dL (ref 2.9–4.4)
Albumin/Glob SerPl: 1.1 (ref 0.7–1.7)
Alpha 1: 0.3 g/dL (ref 0.0–0.4)
Alpha2 Glob SerPl Elph-Mcnc: 0.6 g/dL (ref 0.4–1.0)
B-Globulin SerPl Elph-Mcnc: 1.1 g/dL (ref 0.7–1.3)
Gamma Glob SerPl Elph-Mcnc: 1.2 g/dL (ref 0.4–1.8)
Globulin, Total: 3.2 g/dL (ref 2.2–3.9)
IgA: 416 mg/dL (ref 61–437)
IgG (Immunoglobin G), Serum: 1190 mg/dL (ref 603–1613)
IgM (Immunoglobulin M), Srm: 40 mg/dL (ref 20–172)
Total Protein ELP: 6.5 g/dL (ref 6.0–8.5)

## 2019-11-10 LAB — COOXEMETRY PANEL
Carboxyhemoglobin: 1.6 % — ABNORMAL HIGH (ref 0.5–1.5)
Carboxyhemoglobin: 1.5 % (ref 0.5–1.5)
Methemoglobin: 0.5 % (ref 0.0–1.5)
Methemoglobin: 0.8 % (ref 0.0–1.5)
O2 Saturation: 97.2 %
O2 Saturation: 65.6 %
Total hemoglobin: 11.4 g/dL — ABNORMAL LOW (ref 12.0–16.0)
Total hemoglobin: 11.8 g/dL — ABNORMAL LOW (ref 12.0–16.0)

## 2019-11-10 LAB — BASIC METABOLIC PANEL
Anion gap: 12 (ref 5–15)
BUN: 18 mg/dL (ref 8–23)
CO2: 26 mmol/L (ref 22–32)
Calcium: 8.6 mg/dL — ABNORMAL LOW (ref 8.9–10.3)
Chloride: 94 mmol/L — ABNORMAL LOW (ref 98–111)
Creatinine, Ser: 1.06 mg/dL (ref 0.61–1.24)
GFR, Estimated: >60 mL/min (ref >60)
Glucose, Bld: 135 mg/dL — ABNORMAL HIGH (ref 70–99)
Potassium: 3.8 mmol/L (ref 3.5–5.1)
Sodium: 132 mmol/L — ABNORMAL LOW (ref 135–145)

## 2019-11-10 LAB — GLUCOSE, CAPILLARY
Glucose-Capillary: 98 mg/dL (ref 70–99)
Glucose-Capillary: 95 mg/dL (ref 70–99)
Glucose-Capillary: 121 mg/dL — ABNORMAL HIGH (ref 70–99)
Glucose-Capillary: 216 mg/dL — ABNORMAL HIGH (ref 70–99)

## 2019-11-10 LAB — PROTIME-INR
INR: 1.6 — ABNORMAL HIGH (ref 0.8–1.2)
Prothrombin Time: 18.5 seconds — ABNORMAL HIGH (ref 11.4–15.2)

## 2019-11-10 LAB — MAGNESIUM: Magnesium: 2.1 mg/dL (ref 1.7–2.4)

## 2019-11-10 LAB — IMMUNOFIXATION, URINE

## 2019-11-10 SURGERY — CARDIOVERSION
Anesthesia: General

## 2019-11-10 MED ORDER — SODIUM CHLORIDE 0.9% FLUSH: 3.0000 mL | INTRAVENOUS | Status: DC | PRN

## 2019-11-10 MED ORDER — SODIUM CHLORIDE 0.9 % IV SOLN: 250.0000 mL | INTRAVENOUS | Status: DC | PRN

## 2019-11-10 MED ORDER — ASPIRIN 81 MG PO CHEW
81.0000 mg | CHEWABLE_TABLET | ORAL | Status: AC
  Administered 2019-11-11: 81 mg via ORAL
  Filled 2019-11-10: qty 1

## 2019-11-10 MED ORDER — LIDOCAINE 2% (20 MG/ML) 5 ML SYRINGE
INTRAMUSCULAR | Status: DC | PRN
  Administered 2019-11-10: 20 mg via INTRAVENOUS

## 2019-11-10 MED ORDER — SODIUM CHLORIDE 0.9% FLUSH
3.0000 mL | Freq: Two times a day (BID) | INTRAVENOUS | Status: DC
  Administered 2019-11-12 – 2019-11-14 (×4): 3 mL via INTRAVENOUS

## 2019-11-10 MED ORDER — MILRINONE LACTATE IN DEXTROSE 20-5 MG/100ML-% IV SOLN: 0.1250 ug/kg/min | INTRAVENOUS | Status: DC

## 2019-11-10 MED ORDER — TECHNETIUM TC 99M PYROPHOSPHATE
20.0000 | Freq: Once | INTRAVENOUS | Status: AC | PRN
  Administered 2019-11-10: 20 via INTRAVENOUS
  Filled 2019-11-10: qty 20

## 2019-11-10 MED ORDER — AMIODARONE HCL 200 MG PO TABS: 200.0000 mg | ORAL_TABLET | Freq: Every day | ORAL | Status: DC

## 2019-11-10 MED ORDER — SODIUM CHLORIDE 0.9 % IV SOLN: INTRAVENOUS | Status: DC

## 2019-11-10 MED ORDER — SODIUM CHLORIDE 0.9 % IV SOLN: INTRAVENOUS | Status: DC | PRN

## 2019-11-10 MED ORDER — ETOMIDATE 2 MG/ML IV SOLN
INTRAVENOUS | Status: DC | PRN
  Administered 2019-11-10: 4 mg via INTRAVENOUS
  Administered 2019-11-10: 8 mg via INTRAVENOUS

## 2019-11-10 NOTE — Transfer of Care (Signed)
Immediate Anesthesia Transfer of Care Note  Patient: Mykai Kassebaum  Procedure(s) Performed: CARDIOVERSION (N/A )  Patient Location: Endoscopy Unit  Anesthesia Type:General  Level of Consciousness: drowsy  Airway & Oxygen Therapy: Patient Spontanous Breathing  Post-op Assessment: Report given to RN and Post -op Vital signs reviewed and stable  Post vital signs: Reviewed and stable  Last Vitals:  Vitals Value Taken Time  BP    Temp    Pulse 47 11/10/19 1014  Resp 18 11/10/19 1014  SpO2 98 % 11/10/19 1014  Vitals shown include unvalidated device data.  Last Pain:  Vitals:   11/10/19 0904  TempSrc: Temporal  PainSc:       Patients Stated Pain Goal: 3 (11/05/19 1815)  Complications: No complications documented.

## 2019-11-10 NOTE — TOC Progression Note (Addendum)
Transition of Care [facility]) - Progression Note    Patient Details  Name: Nycholas Rayner MRN: [id number] Date of Birth: 11/26/1955  Transition of Care [facility]) CM/SW Contact  Terrial Rhodes, LCSWA Phone Number: 11/10/2019, 3:23 PM  Clinical Narrative:     CSW spoke with patients brother [name] and provided SNF bed offers. Patients brother chose [facility] for SNF placement. CSW spoke with Aram Beecham with [facility] who confirmed they can accept patient for SNF placement.  Patient has SN[facility] bed at [facility]. Insurance authorization pending.  CSW will continue to follow.  Expected Discharge Plan: Skilled Nursing Facility Barriers to Discharge: Continued Medical Work up  Expected Discharge Plan and Services Expected Discharge Plan: Skilled Nursing Facility       Living arrangements for the past 2 months: Single Family Home                                       Social Determinants of Health (SDOH) Interventions    Readmission Risk Interventions No flowsheet data found.

## 2019-11-10 NOTE — Evaluation (Signed)
Occupational Therapy Evaluation Patient Details Name: [name] MRN: [id number] DOB: 06-08-1955 Today's Date: 11/10/2019    History of Present Illness 64 yo male with onset of acute CHF was admitted with SOB and inability to move without SOB.  Pt received a paracentesis, removing 4.8L O2. PMHx:  EF 20-25%, a-flutter, CHF, DM, HTN, NICM, stroke with R hemiparesis   Clinical Impression   Pt PTA: Pt living with family 24/7; transfers, but limited mobility due to previous CVA. Pt currently, requires increased assist for ADL, mobility and increased time for tasks due to previous CVAs. R side weakness and modA for stand pivot and sit to stand. Pt speaking a few words, but mostly gestures and "yes" or "no." Pt would benefit from continued OT skilled services for ADL,mobility and safety in SNF setting. OT following acutely.      Follow Up Recommendations  SNF    Equipment Recommendations  None recommended by OT    Recommendations for Other Services       Precautions / Restrictions Precautions Precautions: Fall;Other (comment) Precaution Comments: R sided weakness from previous CVA Restrictions Weight Bearing Restrictions: No      Mobility Bed Mobility Overal bed mobility: Needs Assistance Bed Mobility: Supine to Sit     Supine to sit: Min assist     General bed mobility comments: Heavy use of rails    Transfers Overall transfer level: Needs assistance Equipment used: 1 person hand held assist Transfers: Sit to/from [employer] Transfers Sit to Stand: Mod assist Stand pivot transfers: Mod assist       General transfer comment: Guidance for hand placement; modA for power-up    Balance Overall balance assessment: Needs assistance Sitting-balance support: Feet supported;Bilateral upper extremity supported Sitting balance-Leahy Scale: Fair     Standing balance support: Bilateral upper extremity supported;During functional activity Standing balance-Leahy  Scale: Poor                             ADL either performed or assessed with clinical judgement   ADL Overall ADL's : Needs assistance/impaired Eating/Feeding: Set up;Sitting   Grooming: Min guard;Sitting   Upper Body Bathing: Minimal assistance;Sitting   Lower Body Bathing: Moderate assistance;Sitting/lateral leans;Sit to/from stand   Upper Body Dressing : Minimal assistance;Sitting   Lower Body Dressing: Moderate assistance;Sitting/lateral leans;Sit to/from stand   Toilet Transfer: Minimal assistance;Stand-pivot;Cueing for safety;Cueing for sequencing   Toileting- Clothing Manipulation and Hygiene: Moderate assistance;Sit to/from stand;Sitting/lateral lean       Functional mobility during ADLs: Moderate assistance;Cueing for safety;Cueing for sequencing General ADL Comments: Pt requires increased assist for ADL, mobility and increased time for tasks due to previous CVAs.     Vision Baseline Vision/History: No visual deficits Patient Visual Report: No change from baseline Vision Assessment?: No apparent visual deficits     Perception     Praxis      Pertinent Vitals/Pain Pain Assessment: No/denies pain     Hand Dominance Left   Extremity/Trunk Assessment Upper Extremity Assessment Upper Extremity Assessment: Generalized weakness   Lower Extremity Assessment Lower Extremity Assessment: Generalized weakness       Communication Communication Communication: Expressive difficulties   Cognition Arousal/Alertness: Awake/alert Behavior During Therapy: Impulsive Overall Cognitive Status: Difficult to assess                                 General Comments: Pt expressing with gestures  or yes/no.    General Comments  VSS.    Exercises     Shoulder Instructions      Home Living Family/patient expects to be discharged to:: Private residence Living Arrangements: Other relatives Available Help at Discharge: Family;Available 24  hours/day Type of Home: House Home Access: Ramped entrance     Home Layout: One level     Bathroom Shower/Tub: Chief Strategy Officer: Handicapped height     Home Equipment: Cane - single point;Walker - 2 wheels;Shower seat - built in;Bedside commode   Additional Comments: family was source of info      Prior Functioning/Environment Level of Independence: Needs assistance  Gait / Transfers Assistance Needed: prev had walked on [facility] Building ADL's / Homemaking Assistance Needed: family helps with transport, meals meds Communication / Swallowing Assistance Needed: expressive aphasia          OT Problem List: Decreased strength;Decreased range of motion;Decreased activity tolerance;Impaired balance (sitting and/or standing);Decreased safety awareness;Impaired UE functional use;Pain;Decreased knowledge of use of DME or AE;Decreased cognition      OT Treatment/Interventions: Self-care/ADL training;Therapeutic exercise;DME and/or AE instruction;Therapeutic activities;Cognitive remediation/compensation;Patient/family education;Balance training    OT Goals(Current goals can be found in the care plan section) Acute Rehab OT Goals Patient Stated Goal: to walk OT Goal Formulation: With patient Time For Goal Achievement: 11/24/19 Potential to Achieve Goals: Good ADL Goals Pt Will Perform Grooming: with supervision;standing Pt Will Perform Lower Body Dressing: with supervision;sit to/from stand Pt Will Transfer to Toilet: with min guard assist;stand pivot transfer Pt Will Perform Toileting - Clothing Manipulation and hygiene: with min assist;sit to/from stand;sitting/lateral leans Pt/caregiver will Perform Home Exercise Program: Increased strength;Increased ROM;Both right and left upper extremity Additional ADL Goal #1: Pt will follow (3) multi-step commands with 1-2 verbal cues for sequencing in order to increase independence with ADL.  OT Frequency: Min 2X/week   Barriers to D/C:             Co-evaluation              AM-PAC OT "6 Clicks" Daily Activity     Outcome Measure Help from another person eating meals?: A Little Help from another person taking care of personal grooming?: A Little Help from another person toileting, which includes using toliet, bedpan, or urinal?: A Lot Help from another person bathing (including washing, rinsing, drying)?: A Lot Help from another person to put on and taking off regular upper body clothing?: A Little Help from another person to put on and taking off regular lower body clothing?: A Lot 6 Click Score: 15   End of Session Equipment Utilized During Treatment: Gait belt  Activity Tolerance: Patient tolerated treatment well Patient left:    OT Visit Diagnosis: Unsteadiness on feet (R26.81);Muscle weakness (generalized) (M62.81);Other symptoms and signs involving cognitive function                Time: 5784-6962 OT Time Calculation (min): 16 min Charges:  OT General Charges $OT Visit: 1 Visit OT Evaluation $OT Eval Moderate Complexity: 1 Mod    Flora Lipps, OTR/L Acute Rehabilitation Services Pager: [phone] Office: [phone]   [name] C 11/10/2019, 5:53 PM

## 2019-11-10 NOTE — Anesthesia Preprocedure Evaluation (Addendum)
Anesthesia Evaluation  Patient identified by MRN, date of birth, ID band  Reviewed: Allergy & Precautions, NPO status , Patient's Chart, lab work & pertinent test results  Airway Mallampati: IV  TM Distance: >3 FB Neck ROM: Full   Comment: Pt did not open mouth Dental  (+) Dental Advisory Given   Pulmonary    breath sounds clear to auscultation       Cardiovascular hypertension, +CHF  + dysrhythmias Atrial Fibrillation  Rhythm:Irregular Rate:Abnormal     Neuro/Psych  Headaches, CVA negative psych ROS   GI/Hepatic negative GI ROS, Neg liver ROS,   Endo/Other  diabetes  Renal/GU      Musculoskeletal negative musculoskeletal ROS (+)   Abdominal Normal abdominal exam  (+)   Peds  Hematology negative hematology ROS (+)   Anesthesia Other Findings   Reproductive/Obstetrics                           Anesthesia Physical Anesthesia Plan  ASA: IV  Anesthesia Plan: General   Post-op Pain Management:    Induction: Intravenous  PONV Risk Score and Plan: 0 and Propofol infusion  Airway Management Planned: Natural Airway and Simple Face Mask  Additional Equipment: None  Intra-op Plan:   Post-operative Plan:   Informed Consent: I have reviewed the patients History and Physical, chart, labs and discussed the procedure including the risks, benefits and alternatives for the proposed anesthesia with the patient or authorized representative who has indicated his/her understanding and acceptance.     Consent reviewed with POA  Plan Discussed with: CRNA  Anesthesia Plan Comments: (Pt aphasic.   Echo:  1. Left ventricular ejection fraction, by estimation, is 15-20%. The left  ventricle has severely decreased function. The left ventricle demonstrates  global hypokinesis. Left ventricular diastolic function could not be  evaluated. Elevated left atrial  pressure. There is the interventricular  septum is flattened in diastole  ('D' shaped left ventricle), consistent with right ventricular volume  overload.  2. Right ventricular systolic function is severely reduced. The right  ventricular size is severely enlarged. There is mildly elevated pulmonary  artery systolic pressure. The estimated right ventricular systolic  pressure is 39.8 mmHg.  3. Left atrial size was severely dilated.  4. Right atrial size was severely dilated.  5. The mitral valve is grossly normal. Mild mitral valve regurgitation.  No evidence of mitral stenosis.  6. The tricuspid valve is abnormal. Tricuspid valve regurgitation is  severe.  7. The aortic valve is tricuspid. Aortic valve regurgitation is not  visualized. No aortic stenosis is present.  8. The inferior vena cava is dilated in size with <50% respiratory  variability, suggesting right atrial pressure of 15 mmHg. )       Anesthesia Quick Evaluation

## 2019-11-10 NOTE — Anesthesia Postprocedure Evaluation (Signed)
Anesthesia Post Note  Patient: [name]  Procedure(s) Performed: CARDIOVERSION (N/A )     Patient location during evaluation: PACU Anesthesia Type: General Level of consciousness: awake and alert Pain management: pain level controlled Vital Signs Assessment: post-procedure vital signs reviewed and stable Respiratory status: spontaneous breathing, nonlabored ventilation, respiratory function stable and patient connected to nasal cannula oxygen Cardiovascular status: blood pressure returned to baseline and stable Postop Assessment: no apparent nausea or vomiting Anesthetic complications: no   No complications documented.  Last Vitals:  Vitals:   11/10/19 1030 11/10/19 1040  BP: (!) 97/49 (!) 91/52  Pulse: 79 (!) 42  Resp: 19 15  Temp:    SpO2: 100% 98%    Last Pain:  Vitals:   11/10/19 1040  TempSrc:   PainSc: 0-No pain                 [name]

## 2019-11-10 NOTE — Interval H&P Note (Signed)
History and Physical Interval Note:  11/10/2019 9:50 AM  [name]  has presented today for surgery, with the diagnosis of afib.  The various methods of treatment have been discussed with the patient and family. After consideration of risks, benefits and other options for treatment, the patient has consented to  Procedure(s): CARDIOVERSION (N/A) as a surgical intervention.  The patient's history has been reviewed, patient examined, no change in status, stable for surgery.  I have reviewed the patient's chart and labs.  Questions were answered to the patient's satisfaction.     Kellie Chisolm [employer]

## 2019-11-10 NOTE — Anesthesia Procedure Notes (Signed)
Procedure Name: General with mask airway Date/Time: 11/10/2019 9:59 AM Performed by: Quentin Ore, CRNA Pre-anesthesia Checklist: Patient identified, Emergency Drugs available, Suction available and Patient being monitored Patient Re-evaluated:Patient Re-evaluated prior to induction Oxygen Delivery Method: Circle system utilized Preoxygenation: Pre-oxygenation with 100% oxygen Induction Type: IV induction Ventilation: Mask ventilation without difficulty Placement Confirmation: positive ETCO2 Dental Injury: Teeth and Oropharynx as per pre-operative assessment

## 2019-11-10 NOTE — Progress Notes (Signed)
Patient ID: [name], male   DOB: September 20, 1955, 64 y.o.   MRN: [id number]     Advanced Heart Failure Rounding Note  PCP-Cardiologist: Verne Carrow, MD   Subjective:    No complaints this morning, denies dyspnea.    Milrinone begun with co-ox 55%, up to 71% yesterday (not accurate this morning).  BP remains soft in the 90s systolic range but creatinine stable at 1.06.  He diuresed well yesterday, weight down 6 lbs. CVP 9 today.   HR lower around 50 today in afib.   Paracentesis (10/31) with 4.8 L out.   Cardiac MRI: 1. Normal LV size with normal wall thickness. EF 24%, diffuse hypokinesis worse in the septum. 2. Mildly dilated RV with mildly decreased systolic function, EF 42%. 3.  Severe biatrial enlargement. 4. Extensive LGE in a non-coronary distribution throughout much of the LV, also involving RV free wall and left and right atrial walls. ECV 42%. This is suggestive of infiltrative disease. The walls are not thick, making amyloidosis and Fabry's less likely. Myocarditis is a consideration.   Objective:   Weight Range: 58.5 kg Body mass index is 20.2 kg/m.   Vital Signs:   Temp:  [97.4 F (36.3 C)-98.6 F (37 C)] 98.6 F (37 C) (11/03 0806) Pulse Rate:  [51-61] 51 (11/03 0806) Resp:  [15-20] 19 (11/03 0806) BP: (86-95)/(55-76) 91/55 (11/03 0806) SpO2:  [98 %-100 %] 100 % (11/03 0806) Weight:  [58.5 kg] 58.5 kg (11/03 0617)    Weight change: Filed Weights   11/07/19 0553 11/09/19 0456 11/10/19 0617  Weight: 67.5 kg 61.3 kg 58.5 kg    Intake/Output:   Intake/Output Summary (Last 24 hours) at 11/10/2019 9211 Last data filed at 11/10/2019 0630 Gross per 24 hour  Intake --  Output 4625 ml  Net -4625 ml      Physical Exam    General: NAD Neck: JVP 8-9 cm, no thyromegaly or thyroid nodule.  Lungs: Clear to auscultation bilaterally with normal respiratory effort. CV: Nondisplaced PMI.  Heart irregular S1/S2, no S3/S4, no murmur.  No  peripheral edema.   Abdomen: Soft, nontender, no hepatosplenomegaly, no distention.  Skin: Intact without lesions or rashes.  Neurologic: Alert and oriented x 3, expressive aphasia.  Psych: Normal affect. Extremities: No clubbing or cyanosis.  HEENT: Normal.    Telemetry   Atrial fibrillation around 50 (personally reviewed)  Labs    CBC Recent Labs    11/09/19 0608 11/10/19 0450  WBC 5.8 5.9  NEUTROABS 4.3 4.4  HGB 10.9* 11.1*  HCT 32.0* 33.1*  MCV 93.0 93.2  PLT 186 200   Basic Metabolic Panel Recent Labs    94/17/40 0608 11/10/19 0450  NA 134* 132*  K 3.7 3.8  CL 102 94*  CO2 26 26  GLUCOSE 113* 135*  BUN 15 18  CREATININE 1.07 1.06  CALCIUM 8.4* 8.6*  MG 1.8 2.1   Liver Function Tests Recent Labs    11/07/19 1547  AST 39  ALT 20  ALKPHOS 163*  BILITOT 2.0*  PROT 6.4*  ALBUMIN 3.2*   No results for input(s): LIPASE, AMYLASE in the last 72 hours. Cardiac Enzymes No results for input(s): CKTOTAL, CKMB, CKMBINDEX, TROPONINI in the last 72 hours.  BNP: BNP (last 3 results) Recent Labs    07/12/19 1913 11/04/19 1818  BNP 1,991.9* 1,651.0*    ProBNP (last 3 results) No results for input(s): PROBNP in the last 8760 hours.   D-Dimer No results for input(s): DDIMER in the  last 72 hours. Hemoglobin A1C No results for input(s): HGBA1C in the last 72 hours. Fasting Lipid Panel No results for input(s): CHOL, HDL, LDLCALC, TRIG, CHOLHDL, LDLDIRECT in the last 72 hours. Thyroid Function Tests No results for input(s): TSH, T4TOTAL, T3FREE, THYROIDAB in the last 72 hours.  Invalid input(s): FREET3  Other results:   Imaging    No results found.   Medications:     Scheduled Medications: . amiodarone  200 mg Oral BID  . apixaban  5 mg Oral BID  . chlorhexidine  15 mL Mouth Rinse BID  . Chlorhexidine Gluconate Cloth  6 each Topical Daily  . digoxin  0.125 mg Oral Daily  . furosemide  80 mg Intravenous BID  . insulin aspart  0-9 Units  Subcutaneous TID WC  . lidocaine  1 application Urethral Once  . mouth rinse  15 mL Mouth Rinse q12n4p  . simvastatin  20 mg Oral Daily  . sodium chloride flush  10-40 mL Intracatheter Q12H    Infusions: . milrinone      PRN Medications: acetaminophen **OR** acetaminophen, lidocaine, loratadine, ondansetron **OR** ondansetron (ZOFRAN) IV, oxyCODONE, sodium chloride flush   Assessment/Plan   1. Acute on chronic systolic CHF: Nonischemic cardiomyopathy known since 2018.  Improved initially in 2018 with DCCV, but has been back in atrial fibrillation since 2019 it appears and EF has been low.  I reviewed echo this admission, EF about 20% with mod-severe RV dilation and moderate RV dysfunction, D-shaped septum, dilated IVC, severe biatrial enlargement. Significant RV failure with ascites on CT abdomen, had paracentesis.  Cardiac MRI showed LV EF 24%, RV mildly dilated with EF 42%, extensive LGE in a non-coronary distribution throughout much of the LV, also involving RV free wall and left and right atrial walls, ECV 42%. This is suggestive of infiltrative disease. The walls are not thick, making amyloidosis and Fabry's less likely. Myocarditis is a consideration. With SBP 90s and inability to tolerate GDMT, concerned for low output HF. PICC placed, co-ox 55% and milrinone 0.25 started.  Yesterday, co-ox 71% (inaccurate this morning).  CVP down to 9 today, diuresed well yesterday with weight down 6 lbs.  Creatinine remains stable.    - Decrease milrinone to 0.125 now and will hold with DCCV.  - Continue digoxin.   - BP too soft for other GDMT right now.  - 1 more dose of Lasix 80 mg IV this morning, then to po torsemide tomorrow.  - Will attempt DCCV today, he initially improved in NSR back in 2018.  However, not in RVR so doubt this is primarily a tachy-mediated CMP.  - With extensive scarring on cardiac MRI, will arrange PYP scan today and have sent urine IFXN and myeloma panel for amyloidosis  workup. - Formal RHC off milrinone tomorrow am.   - Narrow QRS, not CRT candidate.  2. Atrial fibrillation: Now chronic.  Initially in 2018, EF improved after DCCV.  However, as above, he is not in RVR so not sure that cardiomyopathy is primarily due to AF.   - Continue Eliquis, has not missed doses.  - With slower rate today, decrease amiodarone to 200 mg daily.   - DCCV today.  Discussed with patient's brother this morning.   3. Ascites: Large ascites on CT.  Suspect due to RV failure. Paracentesis on 10/31 with 4.8 L out.  4. H/o CVA: In 2010, with expressive aphasia and right-sided weakness. Per niece, he is at baseline.  - PT/OT.  5. Hyponatremia: Hypervolemic  hypernatremia => resolved with 1 dose tolvaptan. Na 132 today, follow closely.  Fluid restrict.   Length of Stay: 5  Marca Ancona, MD  11/10/2019, 8:22 AM  Advanced Heart Failure Team Pager [phone] (M-F; 7a - 4p)  Please contact [facility] for night-coverage after hours (4p -7a ) and weekends on [email or web address]

## 2019-11-10 NOTE — Progress Notes (Signed)
Transport arrived in endoscopy to take pt. To nuclear med for a scan. Pt will then return to inpt room after scan. Primary RN notified

## 2019-11-10 NOTE — H&P (View-Only) (Signed)
Patient ID: [name], male   DOB: September 20, 1955, 64 y.o.   MRN: [id number]     Advanced Heart Failure Rounding Note  PCP-Cardiologist: Verne Carrow, MD   Subjective:    No complaints this morning, denies dyspnea.    Milrinone begun with co-ox 55%, up to 71% yesterday (not accurate this morning).  BP remains soft in the 90s systolic range but creatinine stable at 1.06.  He diuresed well yesterday, weight down 6 lbs. CVP 9 today.   HR lower around 50 today in afib.   Paracentesis (10/31) with 4.8 L out.   Cardiac MRI: 1. Normal LV size with normal wall thickness. EF 24%, diffuse hypokinesis worse in the septum. 2. Mildly dilated RV with mildly decreased systolic function, EF 42%. 3.  Severe biatrial enlargement. 4. Extensive LGE in a non-coronary distribution throughout much of the LV, also involving RV free wall and left and right atrial walls. ECV 42%. This is suggestive of infiltrative disease. The walls are not thick, making amyloidosis and Fabry's less likely. Myocarditis is a consideration.   Objective:   Weight Range: 58.5 kg Body mass index is 20.2 kg/m.   Vital Signs:   Temp:  [97.4 F (36.3 C)-98.6 F (37 C)] 98.6 F (37 C) (11/03 0806) Pulse Rate:  [51-61] 51 (11/03 0806) Resp:  [15-20] 19 (11/03 0806) BP: (86-95)/(55-76) 91/55 (11/03 0806) SpO2:  [98 %-100 %] 100 % (11/03 0806) Weight:  [58.5 kg] 58.5 kg (11/03 0617)    Weight change: Filed Weights   11/07/19 0553 11/09/19 0456 11/10/19 0617  Weight: 67.5 kg 61.3 kg 58.5 kg    Intake/Output:   Intake/Output Summary (Last 24 hours) at 11/10/2019 9211 Last data filed at 11/10/2019 0630 Gross per 24 hour  Intake --  Output 4625 ml  Net -4625 ml      Physical Exam    General: NAD Neck: JVP 8-9 cm, no thyromegaly or thyroid nodule.  Lungs: Clear to auscultation bilaterally with normal respiratory effort. CV: Nondisplaced PMI.  Heart irregular S1/S2, no S3/S4, no murmur.  No  peripheral edema.   Abdomen: Soft, nontender, no hepatosplenomegaly, no distention.  Skin: Intact without lesions or rashes.  Neurologic: Alert and oriented x 3, expressive aphasia.  Psych: Normal affect. Extremities: No clubbing or cyanosis.  HEENT: Normal.    Telemetry   Atrial fibrillation around 50 (personally reviewed)  Labs    CBC Recent Labs    11/09/19 0608 11/10/19 0450  WBC 5.8 5.9  NEUTROABS 4.3 4.4  HGB 10.9* 11.1*  HCT 32.0* 33.1*  MCV 93.0 93.2  PLT 186 200   Basic Metabolic Panel Recent Labs    94/17/40 0608 11/10/19 0450  NA 134* 132*  K 3.7 3.8  CL 102 94*  CO2 26 26  GLUCOSE 113* 135*  BUN 15 18  CREATININE 1.07 1.06  CALCIUM 8.4* 8.6*  MG 1.8 2.1   Liver Function Tests Recent Labs    11/07/19 1547  AST 39  ALT 20  ALKPHOS 163*  BILITOT 2.0*  PROT 6.4*  ALBUMIN 3.2*   No results for input(s): LIPASE, AMYLASE in the last 72 hours. Cardiac Enzymes No results for input(s): CKTOTAL, CKMB, CKMBINDEX, TROPONINI in the last 72 hours.  BNP: BNP (last 3 results) Recent Labs    07/12/19 1913 11/04/19 1818  BNP 1,991.9* 1,651.0*    ProBNP (last 3 results) No results for input(s): PROBNP in the last 8760 hours.   D-Dimer No results for input(s): DDIMER in the  last 72 hours. Hemoglobin A1C No results for input(s): HGBA1C in the last 72 hours. Fasting Lipid Panel No results for input(s): CHOL, HDL, LDLCALC, TRIG, CHOLHDL, LDLDIRECT in the last 72 hours. Thyroid Function Tests No results for input(s): TSH, T4TOTAL, T3FREE, THYROIDAB in the last 72 hours.  Invalid input(s): FREET3  Other results:   Imaging    No results found.   Medications:     Scheduled Medications: . amiodarone  200 mg Oral BID  . apixaban  5 mg Oral BID  . chlorhexidine  15 mL Mouth Rinse BID  . Chlorhexidine Gluconate Cloth  6 each Topical Daily  . digoxin  0.125 mg Oral Daily  . furosemide  80 mg Intravenous BID  . insulin aspart  0-9 Units  Subcutaneous TID WC  . lidocaine  1 application Urethral Once  . mouth rinse  15 mL Mouth Rinse q12n4p  . simvastatin  20 mg Oral Daily  . sodium chloride flush  10-40 mL Intracatheter Q12H    Infusions: . milrinone      PRN Medications: acetaminophen **OR** acetaminophen, lidocaine, loratadine, ondansetron **OR** ondansetron (ZOFRAN) IV, oxyCODONE, sodium chloride flush   Assessment/Plan   1. Acute on chronic systolic CHF: Nonischemic cardiomyopathy known since 2018.  Improved initially in 2018 with DCCV, but has been back in atrial fibrillation since 2019 it appears and EF has been low.  I reviewed echo this admission, EF about 20% with mod-severe RV dilation and moderate RV dysfunction, D-shaped septum, dilated IVC, severe biatrial enlargement. Significant RV failure with ascites on CT abdomen, had paracentesis.  Cardiac MRI showed LV EF 24%, RV mildly dilated with EF 42%, extensive LGE in a non-coronary distribution throughout much of the LV, also involving RV free wall and left and right atrial walls, ECV 42%. This is suggestive of infiltrative disease. The walls are not thick, making amyloidosis and Fabry's less likely. Myocarditis is a consideration. With SBP 90s and inability to tolerate GDMT, concerned for low output HF. PICC placed, co-ox 55% and milrinone 0.25 started.  Yesterday, co-ox 71% (inaccurate this morning).  CVP down to 9 today, diuresed well yesterday with weight down 6 lbs.  Creatinine remains stable.    - Decrease milrinone to 0.125 now and will hold with DCCV.  - Continue digoxin.   - BP too soft for other GDMT right now.  - 1 more dose of Lasix 80 mg IV this morning, then to po torsemide tomorrow.  - Will attempt DCCV today, he initially improved in NSR back in 2018.  However, not in RVR so doubt this is primarily a tachy-mediated CMP.  - With extensive scarring on cardiac MRI, will arrange PYP scan today and have sent urine IFXN and myeloma panel for amyloidosis  workup. - Formal RHC off milrinone tomorrow am.   - Narrow QRS, not CRT candidate.  2. Atrial fibrillation: Now chronic.  Initially in 2018, EF improved after DCCV.  However, as above, he is not in RVR so not sure that cardiomyopathy is primarily due to AF.   - Continue Eliquis, has not missed doses.  - With slower rate today, decrease amiodarone to 200 mg daily.   - DCCV today.  Discussed with patient's brother this morning.   3. Ascites: Large ascites on CT.  Suspect due to RV failure. Paracentesis on 10/31 with 4.8 L out.  4. H/o CVA: In 2010, with expressive aphasia and right-sided weakness. Per niece, he is at baseline.  - PT/OT.  5. Hyponatremia: Hypervolemic  hypernatremia => resolved with 1 dose tolvaptan. Na 132 today, follow closely.  Fluid restrict.   Length of Stay: 5  Marca Ancona, MD  11/10/2019, 8:22 AM  Advanced Heart Failure Team Pager [phone] (M-F; 7a - 4p)  Please contact [facility] for night-coverage after hours (4p -7a ) and weekends on [email or web address]

## 2019-11-10 NOTE — Procedures (Signed)
Electrical Cardioversion Procedure Note Lorie Melichar [id number] 1955-05-24  Procedure: Electrical Cardioversion Indications:  Atrial Fibrillation  Procedure Details Consent: Risks of procedure as well as the alternatives and risks of each were explained to the (patient/caregiver).  Consent for procedure obtained. Time Out: Verified patient identification, verified procedure, site/side was marked, verified correct patient position, special equipment/implants available, medications/allergies/relevent history reviewed, required imaging and test results available.  Performed  Patient placed on cardiac monitor, pulse oximetry, supplemental oxygen as necessary.  Sedation given: Propofol per anesthesiology Pacer pads placed anterior and posterior chest.  Cardioverted 3 times, the 2nd two times with sternal pressure.  Cardioverted at 200J.  Evaluation Findings: Post procedure EKG shows: Atrial Fibrillation, unable to convert.  Complications: None Patient did tolerate procedure well.   Marca Ancona 11/10/2019, 10:08 AM

## 2019-11-10 NOTE — Progress Notes (Signed)
PROGRESS NOTE    [name]  GYJ:[id number] DOB: 1955/08/21 DOA: 11/04/2019 PCP: Mirna Mires, MD     Brief Narrative:  64 year old male with a history of stroke with expressive aphasia, right-sided paralysis, nonischemic cardiomyopathy, systolic CHF EF 20 to 25% as of April 2021 echo, hypertension, diabetes mellitus type 2, persistent atrial flutter/atrial fibrillation came to ED with chief complaints of dyspnea.  Patient became progressively more dyspneic at home.  He had been ambulating with cane and started developing worsening distention/swelling in the abdomen.  His Lasix dosing was increased outpatient per his cardiologist with minimal improvement.  Due to progressively worsening dyspnea and abdominal swelling he was brought to the ED for further evaluation. He was found to have elevated BNP, indeterminate troponins.  Initially started on Lasix for diuresis and abdominal ascites.  He became more hypotensive and bradycardic requiring discontinuation of Lasix.  Cardiology was consulted for concern of development cardiogenic shock.  Cardiology recommended transfer to [facility] for evaluation by Heart failure team.  Entresto, metoprolol, Lasix are currently on hold.  Assessment & Plan:   Principal Problem:   Acute on chronic systolic CHF (congestive heart failure) (HCC) Active Problems:   HLD (hyperlipidemia)   History of stroke   Type 2 diabetes mellitus without complication, without long-term current use of insulin (HCC)   Persistent atrial fibrillation (HCC)   Cardiogenic shock (HCC)   Hyponatremia   Hypothermia   1. Acute on chronic systolic CHF-  Echo in 2021 showed EF 20 to 25%, repeat echo done during this hospitalization showed EF of 15 to 20%.  BNP 1651.  Initially started on Lasix however became bradycardic and hypotensive so Lasix was held.  Likely cardiogenic shock.  Heart failure team consulted.  Patient started on milrinone and IV Lasix.  Patient underwent DCCV  11/3,  unable to convert,  remains in A. fib.  2. Ascites- s/p paracentesis on 11/07/2019 with 4.8 liters fluid removal.  Likely from right heart failure.  3. Cardiogenic shock-Patient was started on IV Lasix however became bradycardic and hypotensive, Lasix was held and patient was transferred to [facility] for evaluation by heart failure team.  At this time he is both on milrinone as well as IV Lasix.  Heart failure team following.  4. Hyponatremia-resolved, secondary likely hypervolemic hyponatremia from underlying CHF.  Sodium has improved today to 135.  He received 1 dose of tolvaptan on 11/9.  Continue diuresis with inotropic support as above.  We will follow serum sodium level in a.m.    5. Persistent atrial fibrillation-beta-blockers on hold due to hypotension.  Continue digoxin, amiodarone, Eliquis.   Patient underwent DCCV, unable to convert remains in A. fib.  6. Diabetes mellitus type 2- He was hypoglycemic on admission, continue hypoglycemic protocol.  Sliding scale insulin with NovoLog.  7. History of stroke-residual severe expressive aphasia, has right-sided paralysis. Remains at baseline.    DVT prophylaxis: Eliquis Code Status: Full Family Communication: No one at bed side. Disposition Plan:    Status is: Inpatient  Remains inpatient appropriate because:Inpatient level of care appropriate due to severity of illness   Dispo: The patient is from: Home              Anticipated d/c is to: SNF              Anticipated d/c date is: 2 days              Patient currently is not medically stable to d/c.  Consultants:   Cardiology  Procedures:Cardioversion  Antimicrobials:  Anti-infectives (From admission, onward)   None      Subjective: Patient was seen and examined at bedside overnight events noted.  Patient reports feeling better,  denies any chest pain,  dizziness or shortness of breath. He is s/p DCCV remains in a/ fib unable to  convert.  Objective: Vitals:   11/10/19 1020 11/10/19 1030 11/10/19 1040 11/10/19 1319  BP: 102/60 (!) 97/49 (!) 91/52 102/72  Pulse: (!) 48 79 (!) 42 (!) 52  Resp: 16 19 15 17   Temp:    97.7 F (36.5 C)  TempSrc:    Oral  SpO2: 100% 100% 98% 100%  Weight:      Height:        Intake/Output Summary (Last 24 hours) at 11/10/2019 1702 Last data filed at 11/10/2019 1431 Gross per 24 hour  Intake 372 ml  Output 4325 ml  Net -3953 ml   Filed Weights   11/07/19 0553 11/09/19 0456 11/10/19 0617  Weight: 67.5 kg 61.3 kg 58.5 kg    Examination:  General exam: Appears calm and comfortable,   Respiratory system: Clear to auscultation. Respiratory effort normal. Cardiovascular system: S1 & S2 heard, RRR. No JVD, murmurs, rubs, gallops or clicks. No pedal edema. Gastrointestinal system: Abdomen is nondistended, soft and nontender. No organomegaly or masses felt. Normal bowel sounds heard. Central nervous system: Alert and oriented.  Expressive aphasia Extremities: No edema, no cyanosis, no clubbing. Skin: No rashes, lesions or ulcers Psychiatry: Judgement and insight appear normal. Mood & affect appropriate.     Data Reviewed: I have personally reviewed following labs and imaging studies  CBC: Recent Labs  Lab 11/05/19 0422 11/06/19 0526 11/08/19 0553 11/09/19 0608 11/10/19 0450  WBC 5.7 6.1 5.5 5.8 5.9  NEUTROABS 4.5 4.7 4.2 4.3 4.4  HGB 12.3* 12.4* 11.3* 10.9* 11.1*  HCT 35.6* 36.9* 33.1* 32.0* 33.1*  MCV 93.0 93.4 93.0 93.0 93.2  PLT 211 213 200 186 200   Basic Metabolic Panel: Recent Labs  Lab 11/05/19 0422 11/05/19 0422 11/06/19 0526 11/07/19 2103 11/08/19 0553 11/09/19 0608 11/10/19 0450  NA 126*   < > 126* 134* 135 134* 132*  K 5.0  --  5.2*  --  4.1 3.7 3.8  CL 91*  --  95*  --  103 102 94*  CO2 19*  --  20*  --  24 26 26   GLUCOSE 61*  --  95  --  116* 113* 135*  BUN 25*  --  20  --  13 15 18   CREATININE 1.23  --  1.10  --  1.02 1.07 1.06  CALCIUM  9.5  --  9.2  --  8.7* 8.4* 8.6*  MG 2.0  --  2.3  --  2.0 1.8 2.1   < > = values in this interval not displayed.   GFR: Estimated Creatinine Clearance: 58.3 mL/min (by C-G formula based on SCr of 1.06 mg/dL). Liver Function Tests: Recent Labs  Lab 11/04/19 1818 11/07/19 1547  AST 35 39  ALT 17 20  ALKPHOS 206* 163*  BILITOT 1.8* 2.0*  PROT 8.4* 6.4*  ALBUMIN 4.3 3.2*   No results for input(s): LIPASE, AMYLASE in the last 168 hours. No results for input(s): AMMONIA in the last 168 hours. Coagulation Profile: Recent Labs  Lab 11/10/19 0450  INR 1.6*   Cardiac Enzymes: No results for input(s): CKTOTAL, CKMB, CKMBINDEX, TROPONINI in the last 168 hours. BNP (last 3 results)  No results for input(s): PROBNP in the last 8760 hours. HbA1C: No results for input(s): HGBA1C in the last 72 hours. CBG: Recent Labs  Lab 11/09/19 1724 11/09/19 2046 11/10/19 0813 11/10/19 1322 11/10/19 1604  GLUCAP 98 168* 98 95 121*   Lipid Profile: No results for input(s): CHOL, HDL, LDLCALC, TRIG, CHOLHDL, LDLDIRECT in the last 72 hours. Thyroid Function Tests: No results for input(s): TSH, T4TOTAL, FREET4, T3FREE, THYROIDAB in the last 72 hours. Anemia Panel: No results for input(s): VITAMINB12, FOLATE, FERRITIN, TIBC, IRON, RETICCTPCT in the last 72 hours. Sepsis Labs: No results for input(s): PROCALCITON, LATICACIDVEN in the last 168 hours.  Recent Results (from the past 240 hour(s))  Respiratory Panel by RT PCR (Flu A&B, Covid) - Nasopharyngeal Swab     Status: None   Collection Time: 11/04/19  6:21 PM   Specimen: Nasopharyngeal Swab  Result Value Ref Range Status   SARS Coronavirus 2 by RT PCR NEGATIVE NEGATIVE Final    Comment: (NOTE) SARS-CoV-2 target nucleic acids are NOT DETECTED.  The SARS-CoV-2 RNA is generally detectable in upper respiratoy specimens during the acute phase of infection. The lowest concentration of SARS-CoV-2 viral copies this assay can detect is 131  copies/mL. A negative result does not preclude SARS-Cov-2 infection and should not be used as the sole basis for treatment or other patient management decisions. A negative result may occur with  improper specimen collection/handling, submission of specimen other than nasopharyngeal swab, presence of viral mutation(s) within the areas targeted by this assay, and inadequate number of viral copies (<131 copies/mL). A negative result must be combined with clinical observations, patient history, and epidemiological information. The expected result is Negative.  Fact Sheet for Patients:  https://www.moore.com/  Fact Sheet for Healthcare Providers:  https://www.young.biz/  This test is no t yet approved or cleared by the [place] FDA and  has been authorized for detection and/or diagnosis of SARS-CoV-2 by FDA under an Emergency Use Authorization (EUA). This EUA will remain  in effect (meaning this test can be used) for the duration of the COVID-19 declaration under Section 564(b)(1) of the Act, 21 U.S.C. section 360bbb-3(b)(1), unless the authorization is terminated or revoked sooner.     Influenza A by PCR NEGATIVE NEGATIVE Final   Influenza B by PCR NEGATIVE NEGATIVE Final    Comment: (NOTE) The Xpert Xpress SARS-CoV-2/FLU/RSV assay is intended as an aid in  the diagnosis of influenza from Nasopharyngeal swab specimens and  should not be used as a sole basis for treatment. Nasal washings and  aspirates are unacceptable for Xpert Xpress SARS-CoV-2/FLU/RSV  testing.  Fact Sheet for Patients: https://www.moore.com/  Fact Sheet for Healthcare Providers: https://www.young.biz/  This test is not yet approved or cleared by the [place] FDA and  has been authorized for detection and/or diagnosis of SARS-CoV-2 by  FDA under an Emergency Use Authorization (EUA). This EUA will remain  in effect (meaning  this test can be used) for the duration of the  Covid-19 declaration under Section 564(b)(1) of the Act, 21  U.S.C. section 360bbb-3(b)(1), unless the authorization is  terminated or revoked. Performed at [facility], [address] [place], [place] [address]   Group A Strep by PCR     Status: None   Collection Time: 11/04/19  6:45 PM   Specimen: Throat; Sterile Swab  Result Value Ref Range Status   Group A Strep by PCR NOT DETECTED NOT DETECTED Final    Comment: Performed at [facility], [address] [place], [place] [address]     Radiology Studies: NM CARDIAC AMYLOID TUMOR LOC INFLAM SPECT 1 DAY  Result Date: 11/10/2019 CLINICAL DATA:  HEART FAILURE. CONCERN FOR CARDIAC AMYLOIDOSIS. EXAM: NUCLEAR MEDICINE TUMOR LOCALIZATION. PYP CARDIAC AMYLOIDOSIS SCAN WITH SPECT TECHNIQUE: Following intravenous administration of radiopharmaceutical, anterior planar images of the chest were obtained. Regions of interest were placed on the heart and contralateral chest wall for quantitative assessment. Additional SPECT imaging of the chest was obtained. RADIOPHARMACEUTICALS:  8 mCi TECHNETIUM 99 PYROPHOSPHATE FINDINGS: Planar Visual assessment: Anterior planar imaging demonstrates radiotracer uptake within the heart equal to uptake within the adjacent ribs (Grade 2). Quantitative assessment : Quantitative assessment of the cardiac uptake compared to the contralateral chest wall is equal to 1.64 (H/CL = 1.64 (manual recalculation) SPECT assessment: SPECT imaging of the chest demonstrates moderate radiotracer accumulation within the LEFT ventricle. 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 Electronically Signed   By: Genevive Bi M.D.   On: 11/10/2019 14:22    Scheduled Meds: . apixaban  5 mg Oral BID  . [START ON 11/11/2019] aspirin  81 mg Oral Pre-Cath  . chlorhexidine  15 mL Mouth  Rinse BID  . Chlorhexidine Gluconate Cloth  6 each Topical Daily  . digoxin  0.125 mg Oral Daily  . insulin aspart  0-9 Units Subcutaneous TID WC  . lidocaine  1 application Urethral Once  . mouth rinse  15 mL Mouth Rinse q12n4p  . simvastatin  20 mg Oral Daily  . sodium chloride flush  10-40 mL Intracatheter Q12H  . sodium chloride flush  3 mL Intravenous Q12H   Continuous Infusions: . sodium chloride    . [START ON 11/11/2019] sodium chloride       LOS: 5 days    Time spent: 35 mins.    Cipriano Bunker, MD [facility]   If 7PM-7AM, please contact night-coverage

## 2019-11-11 ENCOUNTER — Encounter (HOSPITAL_COMMUNITY): Payer: Self-pay | Admitting: Cardiology

## 2019-11-11 ENCOUNTER — Ambulatory Visit (HOSPITAL_COMMUNITY): Admission: RE | Admit: 2019-11-11 | Payer: Medicaid Other | Source: Home / Self Care | Admitting: Cardiology

## 2019-11-11 ENCOUNTER — Encounter (HOSPITAL_COMMUNITY)
Admission: EM | Disposition: A | Discharge status: Hospice | Payer: Self-pay | Source: Home / Self Care | Attending: Family Medicine

## 2019-11-11 ENCOUNTER — Other Ambulatory Visit: Payer: Medicaid Other

## 2019-11-11 DIAGNOSIS — I509 Heart failure, unspecified: Secondary | ICD-10-CM | POA: Diagnosis not present

## 2019-11-11 DIAGNOSIS — I5023 Acute on chronic systolic (congestive) heart failure: Secondary | ICD-10-CM | POA: Diagnosis not present

## 2019-11-11 HISTORY — PX: RIGHT HEART CATH: CATH118263

## 2019-11-11 LAB — GLUCOSE, CAPILLARY
Glucose-Capillary: 88 mg/dL (ref 70–99)
Glucose-Capillary: 133 mg/dL — ABNORMAL HIGH (ref 70–99)
Glucose-Capillary: 120 mg/dL — ABNORMAL HIGH (ref 70–99)
Glucose-Capillary: 207 mg/dL — ABNORMAL HIGH (ref 70–99)

## 2019-11-11 LAB — POCT I-STAT EG7
Acid-Base Excess: 6 mmol/L — ABNORMAL HIGH (ref 0.0–2.0)
Acid-Base Excess: 7 mmol/L — ABNORMAL HIGH (ref 0.0–2.0)
Bicarbonate: 30.3 mmol/L — ABNORMAL HIGH (ref 20.0–28.0)
Bicarbonate: 31 mmol/L — ABNORMAL HIGH (ref 20.0–28.0)
Calcium, Ion: 1.15 mmol/L (ref 1.15–1.40)
Calcium, Ion: 1.18 mmol/L (ref 1.15–1.40)
HCT: 39 % (ref 39.0–52.0)
HCT: 40 % (ref 39.0–52.0)
Hemoglobin: 13.3 g/dL (ref 13.0–17.0)
Hemoglobin: 13.6 g/dL (ref 13.0–17.0)
O2 Saturation: 56 %
O2 Saturation: 57 %
Potassium: 3.8 mmol/L (ref 3.5–5.1)
Potassium: 3.8 mmol/L (ref 3.5–5.1)
Sodium: 134 mmol/L — ABNORMAL LOW (ref 135–145)
Sodium: 134 mmol/L — ABNORMAL LOW (ref 135–145)
TCO2: 32 mmol/L (ref 22–32)
TCO2: 32 mmol/L (ref 22–32)
pCO2, Ven: 41.9 mmHg — ABNORMAL LOW (ref 44.0–60.0)
pCO2, Ven: 42.1 mmHg — ABNORMAL LOW (ref 44.0–60.0)
pH, Ven: 7.466 — ABNORMAL HIGH (ref 7.250–7.430)
pH, Ven: 7.477 — ABNORMAL HIGH (ref 7.250–7.430)
pO2, Ven: 28 mmHg — CL (ref 32.0–45.0)
pO2, Ven: 28 mmHg — CL (ref 32.0–45.0)

## 2019-11-11 LAB — PHOSPHORUS: Phosphorus: 4.7 mg/dL — ABNORMAL HIGH (ref 2.5–4.6)

## 2019-11-11 LAB — CBC
HCT: 34.3 % — ABNORMAL LOW (ref 39.0–52.0)
Hemoglobin: 11.6 g/dL — ABNORMAL LOW (ref 13.0–17.0)
MCH: 31.1 pg (ref 26.0–34.0)
MCHC: 33.8 g/dL (ref 30.0–36.0)
MCV: 92 fL (ref 80.0–100.0)
Platelets: 203 10*3/uL (ref 150–400)
RBC: 3.73 MIL/uL — ABNORMAL LOW (ref 4.22–5.81)
RDW: 14.7 % (ref 11.5–15.5)
WBC: 6.5 10*3/uL (ref 4.0–10.5)
nRBC: 0 % (ref 0.0–0.2)

## 2019-11-11 LAB — BASIC METABOLIC PANEL
Anion gap: 10 (ref 5–15)
BUN: 20 mg/dL (ref 8–23)
CO2: 27 mmol/L (ref 22–32)
Calcium: 8.8 mg/dL — ABNORMAL LOW (ref 8.9–10.3)
Chloride: 95 mmol/L — ABNORMAL LOW (ref 98–111)
Creatinine, Ser: 1.14 mg/dL (ref 0.61–1.24)
GFR, Estimated: >60 mL/min (ref >60)
Glucose, Bld: 113 mg/dL — ABNORMAL HIGH (ref 70–99)
Potassium: 3.8 mmol/L (ref 3.5–5.1)
Sodium: 132 mmol/L — ABNORMAL LOW (ref 135–145)

## 2019-11-11 LAB — MAGNESIUM: Magnesium: 1.9 mg/dL (ref 1.7–2.4)

## 2019-11-11 SURGERY — RIGHT HEART CATH
Anesthesia: LOCAL

## 2019-11-11 MED ORDER — LIDOCAINE HCL (PF) 1 % IJ SOLN
INTRAMUSCULAR | Status: DC | PRN
  Administered 2019-11-11: 2 mL

## 2019-11-11 MED ORDER — MILRINONE LACTATE IN DEXTROSE 20-5 MG/100ML-% IV SOLN
0.1250 ug/kg/min | INTRAVENOUS | Status: DC
  Administered 2019-11-11: 0.125 ug/kg/min via INTRAVENOUS
  Filled 2019-11-11: qty 100

## 2019-11-11 MED ORDER — POTASSIUM CHLORIDE CRYS ER 20 MEQ PO TBCR
20.0000 meq | EXTENDED_RELEASE_TABLET | Freq: Once | ORAL | Status: AC
  Administered 2019-11-11: 20 meq via ORAL
  Filled 2019-11-11: qty 1

## 2019-11-11 MED ORDER — SIMVASTATIN 20 MG PO TABS
40.0000 mg | ORAL_TABLET | Freq: Every day | ORAL | Status: DC
  Administered 2019-11-12 – 2019-11-16 (×5): 40 mg via ORAL
  Filled 2019-11-11 (×5): qty 2

## 2019-11-11 MED ORDER — MAGNESIUM SULFATE 2 GM/50ML IV SOLN
2.0000 g | Freq: Once | INTRAVENOUS | Status: AC
  Administered 2019-11-11: 2 g via INTRAVENOUS
  Filled 2019-11-11: qty 50

## 2019-11-11 MED ORDER — HEPARIN (PORCINE) IN NACL 1000-0.9 UT/500ML-% IV SOLN
INTRAVENOUS | Status: AC
  Filled 2019-11-11: qty 500

## 2019-11-11 MED ORDER — TORSEMIDE 20 MG PO TABS
20.0000 mg | ORAL_TABLET | Freq: Every day | ORAL | Status: DC
  Administered 2019-11-11 – 2019-11-13 (×3): 20 mg via ORAL
  Filled 2019-11-11 (×3): qty 1

## 2019-11-11 MED ORDER — DOPAMINE-DEXTROSE 3.2-5 MG/ML-% IV SOLN
5.0000 ug/kg/min | INTRAVENOUS | Status: DC
  Administered 2019-11-11: 5 ug/kg/min via INTRAVENOUS
  Filled 2019-11-11: qty 250

## 2019-11-11 MED ORDER — POLYETHYLENE GLYCOL 3350 17 G PO PACK
17.0000 g | PACK | Freq: Every day | ORAL | Status: DC | PRN
  Administered 2019-11-14: 17 g via ORAL
  Filled 2019-11-11: qty 1

## 2019-11-11 MED ORDER — SPIRONOLACTONE 12.5 MG HALF TABLET
12.5000 mg | ORAL_TABLET | Freq: Every day | ORAL | Status: DC
  Administered 2019-11-11 – 2019-11-16 (×6): 12.5 mg via ORAL
  Filled 2019-11-11 (×6): qty 1

## 2019-11-11 MED ORDER — LIDOCAINE HCL (PF) 1 % IJ SOLN
INTRAMUSCULAR | Status: AC
  Filled 2019-11-11: qty 30

## 2019-11-11 MED ORDER — HEPARIN (PORCINE) IN NACL 1000-0.9 UT/500ML-% IV SOLN
INTRAVENOUS | Status: DC | PRN
  Administered 2019-11-11: 500 mL

## 2019-11-11 SURGICAL SUPPLY — 6 items
CATH SWAN GANZ 7F STRAIGHT (CATHETERS) ×1 IMPLANT
GLIDESHEATH SLENDER 7FR .021G (SHEATH) ×1 IMPLANT
KIT HEART LEFT (KITS) ×2 IMPLANT
PACK CARDIAC CATHETERIZATION (CUSTOM PROCEDURE TRAY) ×2 IMPLANT
TRANSDUCER W/STOPCOCK (MISCELLANEOUS) ×2 IMPLANT
WIRE EMERALD 3MM-J .025X260CM (WIRE) ×1 IMPLANT

## 2019-11-11 NOTE — Interval H&P Note (Signed)
History and Physical Interval Note:  11/11/2019 7:49 AM  [name]  has presented today for surgery, with the diagnosis of heart failure.  The various methods of treatment have been discussed with the patient and family. After consideration of risks, benefits and other options for treatment, the patient has consented to  Procedure(s): RIGHT HEART CATH (N/A) as a surgical intervention.  The patient's history has been reviewed, patient examined, no change in status, stable for surgery.  I have reviewed the patient's chart and labs.  Questions were answered to the patient's satisfaction.     [name] [employer]

## 2019-11-11 NOTE — Progress Notes (Addendum)
Patient ID: [name], male   DOB: Nov 02, 1955, 64 y.o.   MRN: [id number]     Advanced Heart Failure Rounding Note  PCP-Cardiologist: Verne Carrow, MD   Subjective:    No complaints this morning, denies dyspnea.    Milrinone begun with co-ox 55%, increased to 71% yesterday on milrinone.  He was diuresed and milrinone was weaned off prior to DCCV.  He failed DCCV on 11/3, amiodarone stopped.   HR in 40s-50s today in afib.   Paracentesis (10/31) with 4.8 L out.   RHC (11/4): RHC Procedural Findings: Hemodynamics (mmHg) RA mean 7 RV 29/6 PA 29/9, mean 17 PCWP mean 8 Oxygen saturations: PA 57% AO 100% Cardiac Output (Thermo) 2.66 Cardiac Index (Thermo) 1.61 Cardiac Output (Fick) 3.38  Cardiac Index (Fick) 2.05  Cardiac MRI: 1. Normal LV size with normal wall thickness. EF 24%, diffuse hypokinesis worse in the septum. 2. Mildly dilated RV with mildly decreased systolic function, EF 42%. 3.  Severe biatrial enlargement. 4. Extensive LGE in a non-coronary distribution throughout much of the LV, also involving RV free wall and left and right atrial walls. ECV 42%. This is suggestive of infiltrative disease. The walls are not thick, making amyloidosis and Fabry's less likely. Myocarditis is a consideration.  PYP scan: Grade 2, H/CL 1.64.  Suggestive of transthyretin amyloidosis.    Objective:   Weight Range: 55.6 kg Body mass index is 19.2 kg/m.   Vital Signs:   Temp:  [97.7 F (36.5 C)-98.6 F (37 C)] 98 F (36.7 C) (11/04 0658) Pulse Rate:  [42-79] 47 (11/04 0818) Resp:  [6-30] 30 (11/04 0818) BP: (81-116)/(49-72) 116/60 (11/04 0818) SpO2:  [0 %-100 %] 0 % (11/04 0818) Weight:  [55.6 kg] 55.6 kg (11/04 0639) Last BM Date: 11/09/19  Weight change: Filed Weights   11/09/19 0456 11/10/19 0617 11/11/19 0639  Weight: 61.3 kg 58.5 kg 55.6 kg    Intake/Output:   Intake/Output Summary (Last 24 hours) at 11/11/2019 0827 Last data filed at 11/11/2019  0500 Gross per 24 hour  Intake 594 ml  Output 2900 ml  Net -2306 ml      Physical Exam    General: NAD Neck: No JVD, no thyromegaly or thyroid nodule.  Lungs: Clear to auscultation bilaterally with normal respiratory effort. CV: Nondisplaced PMI.  Heart irregular S1/S2, no S3/S4, no murmur.  No peripheral edema.  No carotid bruit.  Normal pedal pulses.  Abdomen: Soft, nontender, no hepatosplenomegaly, no distention.  Skin: Intact without lesions or rashes.  Neurologic: Alert and oriented x 3.  Psych: Normal affect. Extremities: No clubbing or cyanosis.  HEENT: Normal.    Telemetry   Atrial fibrillation 40s-50s (personally reviewed)  Labs    CBC Recent Labs    11/09/19 0608 11/10/19 0450  WBC 5.8 5.9  NEUTROABS 4.3 4.4  HGB 10.9* 11.1*  HCT 32.0* 33.1*  MCV 93.0 93.2  PLT 186 200   Basic Metabolic Panel Recent Labs    70/17/79 0608 11/10/19 0450  NA 134* 132*  K 3.7 3.8  CL 102 94*  CO2 26 26  GLUCOSE 113* 135*  BUN 15 18  CREATININE 1.07 1.06  CALCIUM 8.4* 8.6*  MG 1.8 2.1   Liver Function Tests No results for input(s): AST, ALT, ALKPHOS, BILITOT, PROT, ALBUMIN in the last 72 hours. No results for input(s): LIPASE, AMYLASE in the last 72 hours. Cardiac Enzymes No results for input(s): CKTOTAL, CKMB, CKMBINDEX, TROPONINI in the last 72 hours.  BNP: BNP (last 3  results) Recent Labs    07/12/19 1913 11/04/19 1818  BNP 1,991.9* 1,651.0*    ProBNP (last 3 results) No results for input(s): PROBNP in the last 8760 hours.   D-Dimer No results for input(s): DDIMER in the last 72 hours. Hemoglobin A1C No results for input(s): HGBA1C in the last 72 hours. Fasting Lipid Panel No results for input(s): CHOL, HDL, LDLCALC, TRIG, CHOLHDL, LDLDIRECT in the last 72 hours. Thyroid Function Tests No results for input(s): TSH, T4TOTAL, T3FREE, THYROIDAB in the last 72 hours.  Invalid input(s): FREET3  Other results:   Imaging    NM CARDIAC AMYLOID  TUMOR LOC INFLAM SPECT 1 DAY  Result Date: 11/10/2019 CLINICAL DATA:  HEART FAILURE. CONCERN FOR CARDIAC AMYLOIDOSIS. EXAM: NUCLEAR MEDICINE TUMOR LOCALIZATION. PYP CARDIAC AMYLOIDOSIS SCAN WITH SPECT TECHNIQUE: Following intravenous administration of radiopharmaceutical, anterior planar images of the chest were obtained. Regions of interest were placed on the heart and contralateral chest wall for quantitative assessment. Additional SPECT imaging of the chest was obtained. RADIOPHARMACEUTICALS:  8 mCi TECHNETIUM 99 PYROPHOSPHATE FINDINGS: Planar Visual assessment: Anterior planar imaging demonstrates radiotracer uptake within the heart equal to uptake within the adjacent ribs (Grade 2). Quantitative assessment : Quantitative assessment of the cardiac uptake compared to the contralateral chest wall is equal to 1.64 (H/CL = 1.64 (manual recalculation) SPECT assessment: SPECT imaging of the chest demonstrates moderate radiotracer accumulation within the LEFT ventricle. 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 Electronically Signed   By: Genevive Bi M.D.   On: 11/10/2019 14:22   CARDIAC CATHETERIZATION  Result Date: 11/11/2019 1. Filling pressures optimized. 2. Low cardiac output. Will restart milrinone at 0.125    Medications:     Scheduled Medications: . [MAR Hold] apixaban  5 mg Oral BID  . [MAR Hold] chlorhexidine  15 mL Mouth Rinse BID  . [MAR Hold] Chlorhexidine Gluconate Cloth  6 each Topical Daily  . [MAR Hold] digoxin  0.125 mg Oral Daily  . [MAR Hold] insulin aspart  0-9 Units Subcutaneous TID WC  . [MAR Hold] lidocaine  1 application Urethral Once  . [MAR Hold] mouth rinse  15 mL Mouth Rinse q12n4p  . [MAR Hold] simvastatin  20 mg Oral Daily  . [MAR Hold] sodium chloride flush  10-40 mL Intracatheter Q12H  . [MAR Hold] sodium chloride flush  3 mL Intravenous Q12H  . torsemide  20 mg Oral Daily     Infusions: . sodium chloride    . sodium chloride 10 mL/hr at 11/11/19 0617  . milrinone      PRN Medications: sodium chloride, [MAR Hold] acetaminophen **OR** [MAR Hold] acetaminophen, [MAR Hold] lidocaine, [MAR Hold] loratadine, [MAR Hold] ondansetron **OR** [MAR Hold] ondansetron (ZOFRAN) IV, [MAR Hold] oxyCODONE, [MAR Hold] sodium chloride flush, sodium chloride flush   Assessment/Plan   1. Acute on chronic systolic CHF: Nonischemic cardiomyopathy known since 2018.  Improved initially in 2018 with DCCV, but has been back in atrial fibrillation since 2019 it appears and EF has been low.  I reviewed echo this admission, EF 15-20% with mod-severe RV dilation and moderate RV dysfunction, D-shaped septum, dilated IVC, severe biatrial enlargement. Significant RV failure with ascites on CT abdomen, had paracentesis.  Cardiac MRI showed LV EF 24%, RV mildly dilated with EF 42%, extensive LGE in a non-coronary distribution throughout much of the LV, also involving RV free wall and left and right atrial walls, ECV 42%. This is suggestive of infiltrative disease. The  walls are not thick, which seems to make amyloidosis or Fabry's less likely, but the PYP scan was suggestive of TTR cardiac amyloidosis (myeloma panel and urine immunofixation without monoclonal protein).  He was initially on milrinone for low output HF, this was weaned off for DCCV which he failed.  RHC this morning off milrinone showed optimized filling pressures but low cardiac output.   - For now, will restart milrinone 0.125 mcg/kg/min with low output by RHC.  - Continue digoxin.   - BP remains soft but will try addition of spironolactone 12.5 today.  - Needs BMET today.  - Transition to torsemide 20 mg daily.    - Narrow QRS, not CRT candidate.  - Will need to think about LVAD.  Has barriers (expressive aphasia, deconditioning) but has family support.  I think his RV is ok based on cardiac MRI.   2. Atrial fibrillation: Now  chronic.  Initially in 2018, EF improved after DCCV.  However, as above, he is not in RVR so do not think that cardiomyopathy is primarily due to AF.  He failed DCCV on amiodarone this admission, I stopped amiodarone due to bradycardia. I think that we are not going to be able to get him out of atrial fibrillation.  - Continue Eliquis  3. Ascites: Large ascites on CT.  Suspect due to RV failure. Paracentesis on 10/31 with 4.8 L out.  4. H/o CVA: In 2010, with expressive aphasia and right-sided weakness. Per niece, he is at baseline.  - PT/OT.  5. Hyponatremia: Hypervolemic hypernatremia => resolved with 1 dose tolvaptan. Na 132 yesterday, follow closely.  Fluid restrict.   Length of Stay: 6  Marca Ancona, MD  11/11/2019, 8:27 AM  Advanced Heart Failure Team Pager [phone] (M-F; 7a - 4p)  Please contact [facility] for night-coverage after hours (4p -7a ) and weekends on [email or web address]

## 2019-11-11 NOTE — Progress Notes (Addendum)
Physical Therapy Treatment Patient Details Name: [name] MRN: [id number] DOB: Jan 25, 1955 Today's Date: 11/11/2019    History of Present Illness 64 yo male with onset of acute CHF was admitted with SOB and inability to move without SOB.  Pt received a paracentesis, removing 4.8L O2.  Pt is referred to PT for mobility assessment.  PMHx:  EF 20-25%, a-flutter, CHF, DM, HTN, NICM, stroke with R hemiparesis    PT Comments    Pt continues to require assist with all mobility. Continue to recommend SNF.    Follow Up Recommendations  SNF     Equipment Recommendations  None recommended by PT    Recommendations for Other Services       Precautions / Restrictions Precautions Precautions: Fall;Other (comment) Precaution Comments: R sided weakness from previous CVA    Mobility  Bed Mobility Overal bed mobility: Needs Assistance Bed Mobility: Supine to Sit     Supine to sit: Min assist     General bed mobility comments: Assist to bring RLE off of bed and to elevate trunk into sitting  Transfers Overall transfer level: Needs assistance Equipment used: Straight cane Transfers: Sit to/from [employer] Transfers Sit to Stand: Min assist Stand pivot transfers: Min assist       General transfer comment: Assist to bring hips up and for balance. Bed to chair toward pt's lt  Ambulation/Gait Ambulation/Gait assistance: Mod assist Gait Distance (Feet): 2 Feet Assistive device: Straight cane Gait Pattern/deviations: Step-through pattern;Decreased step length - right;Decreased step length - left;Trunk flexed Gait velocity: decr Gait velocity interpretation: <1.8 ft/sec, indicate of risk for recurrent falls General Gait Details: Assistance for balance and support. Requires significant effort to advance RLE.    Stairs             Wheelchair Mobility    Modified Rankin (Stroke Patients Only)       Balance Overall balance assessment: Needs  assistance Sitting-balance support: Feet supported;Bilateral upper extremity supported Sitting balance-Leahy Scale: Fair     Standing balance support: During functional activity;Single extremity supported Standing balance-Leahy Scale: Poor Standing balance comment: UE support and min guard assist for static standing                            Cognition Arousal/Alertness: Awake/alert Behavior During Therapy: Impulsive Overall Cognitive Status: Difficult to assess                                 General Comments: Pt following 1 step commands with incr time and cuing      Exercises      General Comments        Pertinent Vitals/Pain Pain Assessment: No/denies pain    Home Living                      Prior Function            PT Goals (current goals can now be found in the care plan section) Progress towards PT goals: Progressing toward goals    Frequency    Min 2X/week      PT Plan Current plan remains appropriate;Frequency needs to be updated    Co-evaluation              AM-PAC PT "6 Clicks" Mobility   Outcome Measure  Help needed turning from your back to your side while in  a flat bed without using bedrails?: A Little Help needed moving from lying on your back to sitting on the side of a flat bed without using bedrails?: A Little Help needed moving to and from a bed to a chair (including a wheelchair)?: A Little Help needed standing up from a chair using your arms (e.g., wheelchair or bedside chair)?: A Lot Help needed to walk in hospital room?: A Lot Help needed climbing 3-5 steps with a railing? : Total 6 Click Score: 14    End of Session Equipment Utilized During Treatment: Gait belt Activity Tolerance: Patient tolerated treatment well Patient left: in chair;with call bell/phone within reach;with chair alarm set Nurse Communication: Mobility status PT Visit Diagnosis: Unsteadiness on feet (R26.81);Muscle  weakness (generalized) (M62.81);Hemiplegia and hemiparesis Hemiplegia - Right/Left: Right Hemiplegia - dominant/non-dominant: Dominant Hemiplegia - caused by: Cerebral infarction     Time: 1204-1226 PT Time Calculation (min) (ACUTE ONLY): 22 min  Charges:  $Therapeutic Activity: 8-22 mins                     [facility] PT Acute Rehabilitation Services Pager [phone] Office [phone]    [name] [facility] 11/11/2019, 2:53 PM

## 2019-11-11 NOTE — Progress Notes (Signed)
Patient with episode  of vtach (6 & 3 beats PVC's), denies SOB nor any discomforts. HR fluctuating fr 30-50's,Aflutter/ Afib . B/p  88/66 mmhg . Dr Martyn Malay (on call) notified.  Continue to monitor patient. Kept NPO for cath  this  Am.

## 2019-11-11 NOTE — Progress Notes (Signed)
Pt with 2.45 second pause and 5 beats of vtach. Pt asymptomatic. Paged provider on call [place]. No new orders given. Will continue to monitor pt.

## 2019-11-11 NOTE — Progress Notes (Addendum)
PROGRESS NOTE    [name]  JIR:[id number] DOB: 12-25-1955 DOA: 11/04/2019 PCP: Mirna Mires, MD     Brief Narrative:  64 year old male with a history of stroke with expressive aphasia, right-sided paralysis, nonischemic cardiomyopathy, systolic CHF EF 20 to 25% as of April 2021 echo, hypertension, diabetes mellitus type 2, persistent atrial flutter/atrial fibrillation came to ED with chief complaints of dyspnea.  Patient became progressively more dyspneic at home.  He had been ambulating with cane and started developing worsening distention/swelling in the abdomen.  His Lasix dosing was increased outpatient per his cardiologist with minimal improvement.  Due to progressively worsening dyspnea and abdominal swelling he was brought to the ED for further evaluation. He was found to have elevated BNP, indeterminate troponins.  Initially started on Lasix for diuresis and abdominal ascites. He became more hypotensive and bradycardic requiring discontinuation of Lasix.  Cardiology was consulted for concern of development cardiogenic shock.  Cardiology recommended transfer to [facility] for evaluation by Heart failure team. Entresto, metoprolol, Lasix are currently on hold. Patient underwent DCCV 11/3 , failed to convert , remains in A. Fibrillation.  Assessment & Plan:   Principal Problem:   Acute on chronic systolic CHF (congestive heart failure) (HCC) Active Problems:   HLD (hyperlipidemia)   History of stroke   Type 2 diabetes mellitus without complication, without long-term current use of insulin (HCC)   Persistent atrial fibrillation (HCC)   Cardiogenic shock (HCC)   Hyponatremia   Hypothermia   1. Acute on chronic systolic CHF-  Echo in 2021 showed EF 20 to 25%, repeat echo done during this hospitalization showed EF of 15 to 20%.  BNP 1651.  Initially started on Lasix however became bradycardic and hypotensive so Lasix was held.  Likely cardiogenic shock.  Heart failure team  consulted.  Patient started on milrinone and IV Lasix.  Patient underwent DCCV 11/3,  unable to convert,  remains in A. Fib. Lasix transitioned to torsemide.  2. Ascites- s/p paracentesis on 11/07/2019 with 4.8 liters fluid removal.  Likely from right heart failure.  3. Cardiogenic shock- Patient was started on IV Lasix however became bradycardic and hypotensive, Lasix was held and patient was transferred to [facility] for evaluation by heart failure team.  At this time he is on milrinone , Lasix switched to torsemide.  Heart failure team following.  4. Hyponatremia-resolved, secondary likely hypervolemic hyponatremia from underlying CHF.  Sodium has improved today to 135.  He received 1 dose of tolvaptan on 11/9.  Continue diuresis with inotropic support as above.  We will follow serum sodium level in a.m.    5. Persistent atrial fibrillation-beta-blockers on hold due to hypotension.  Continue digoxin and Eliquis.  Amiodarone discontinued.  Patient underwent DCCV, unable to convert remains in A. fib.  6. Diabetes mellitus type 2- He was hypoglycemic on admission, continue hypoglycemic protocol.  Sliding scale insulin with NovoLog.  7. History of stroke-residual severe expressive aphasia, has right-sided paralysis. Remains at baseline.    DVT prophylaxis: Eliquis Code Status: Full Family Communication: No one at bed side. Disposition Plan:    Status is: Inpatient  Remains inpatient appropriate because:Inpatient level of care appropriate due to severity of illness   Dispo: The patient is from: Home              Anticipated d/c is to: SNF              Anticipated d/c date is: 2 days  Patient currently is not medically stable to d/c.   Consultants:   Cardiology  Procedures:Cardioversion  Antimicrobials:  Anti-infectives (From admission, onward)   None      Subjective: Patient was seen and examined at bedside,  overnight events noted.  Patient reports feeling  better,  denies any chest pain,  dizziness or shortness of breath. He is s/p DCCV remains in a/ fib unable to convert. He is s/p RHC today.  Objective: Vitals:   11/11/19 0813 11/11/19 0818 11/11/19 0838 11/11/19 1416  BP: (!) 109/58 116/60 105/81 93/67  Pulse: (!) 47 (!) 47 (!) 48 84  Resp: (!) 27 (!) 30 19 16   Temp:   97.6 F (36.4 C) 97.7 F (36.5 C)  TempSrc:   Oral Oral  SpO2: 100% (!) 0% 100% 100%  Weight:      Height:        Intake/Output Summary (Last 24 hours) at 11/11/2019 1625 Last data filed at 11/11/2019 1246 Gross per 24 hour  Intake 272 ml  Output 950 ml  Net -678 ml   Filed Weights   11/09/19 0456 11/10/19 0617 11/11/19 0639  Weight: 61.3 kg 58.5 kg 55.6 kg    Examination:  General exam: Appears calm and comfortable,   Respiratory system: Clear to auscultation. Respiratory effort normal. Cardiovascular system: S1 & S2 heard, RRR. No JVD, murmurs, rubs, gallops or clicks. No pedal edema. Gastrointestinal system: Abdomen is nondistended, soft and nontender. No organomegaly or masses felt. Normal bowel sounds heard. Central nervous system: Alert and oriented.  Expressive aphasia Extremities: No edema, no cyanosis, no clubbing. Skin: No rashes, lesions or ulcers Psychiatry: Judgement and insight appear normal. Mood & affect appropriate.     Data Reviewed: I have personally reviewed following labs and imaging studies  CBC: Recent Labs  Lab 11/05/19 0422 11/05/19 0422 11/06/19 0526 11/06/19 0526 11/08/19 0553 11/09/19 0608 11/10/19 0450 11/11/19 0500 11/11/19 0808  WBC 5.7   < > 6.1  --  5.5 5.8 5.9 6.5  --   NEUTROABS 4.5  --  4.7  --  4.2 4.3 4.4  --   --   HGB 12.3*   < > 12.4*   < > 11.3* 10.9* 11.1* 11.6* 13.3  13.6  HCT 35.6*   < > 36.9*   < > 33.1* 32.0* 33.1* 34.3* 39.0  40.0  MCV 93.0   < > 93.4  --  93.0 93.0 93.2 92.0  --   PLT 211   < > 213  --  200 186 200 203  --    < > = values in this interval not displayed.   Basic Metabolic  Panel: Recent Labs  Lab 11/06/19 0526 11/07/19 2103 11/08/19 0553 11/09/19 0608 11/10/19 0450 11/11/19 0808 11/11/19 0815  NA 126*   < > 135 134* 132* 134*  134* 132*  K 5.2*  --  4.1 3.7 3.8 3.8  3.8 3.8  CL 95*  --  103 102 94*  --  95*  CO2 20*  --  24 26 26   --  27  GLUCOSE 95  --  116* 113* 135*  --  113*  BUN 20  --  13 15 18   --  20  CREATININE 1.10  --  1.02 1.07 1.06  --  1.14  CALCIUM 9.2  --  8.7* 8.4* 8.6*  --  8.8*  MG 2.3  --  2.0 1.8 2.1  --  1.9  PHOS  --   --   --   --   --   --  4.7*   < > = values in this interval not displayed.   GFR: Estimated Creatinine Clearance: 51.5 mL/min (by C-G formula based on SCr of 1.14 mg/dL). Liver Function Tests: Recent Labs  Lab 11/04/19 1818 11/07/19 1547  AST 35 39  ALT 17 20  ALKPHOS 206* 163*  BILITOT 1.8* 2.0*  PROT 8.4* 6.4*  ALBUMIN 4.3 3.2*   No results for input(s): LIPASE, AMYLASE in the last 168 hours. No results for input(s): AMMONIA in the last 168 hours. Coagulation Profile: Recent Labs  Lab 11/10/19 0450  INR 1.6*   Cardiac Enzymes: No results for input(s): CKTOTAL, CKMB, CKMBINDEX, TROPONINI in the last 168 hours. BNP (last 3 results) No results for input(s): PROBNP in the last 8760 hours. HbA1C: No results for input(s): HGBA1C in the last 72 hours. CBG: Recent Labs  Lab 11/10/19 1322 11/10/19 1604 11/10/19 2106 11/11/19 0852 11/11/19 1101  GLUCAP 95 121* 216* 88 133*   Lipid Profile: No results for input(s): CHOL, HDL, LDLCALC, TRIG, CHOLHDL, LDLDIRECT in the last 72 hours. Thyroid Function Tests: No results for input(s): TSH, T4TOTAL, FREET4, T3FREE, THYROIDAB in the last 72 hours. Anemia Panel: No results for input(s): VITAMINB12, FOLATE, FERRITIN, TIBC, IRON, RETICCTPCT in the last 72 hours. Sepsis Labs: No results for input(s): PROCALCITON, LATICACIDVEN in the last 168 hours.  Recent Results (from the past 240 hour(s))  Respiratory Panel by RT PCR (Flu A&B, Covid) -  Nasopharyngeal Swab     Status: None   Collection Time: 11/04/19  6:21 PM   Specimen: Nasopharyngeal Swab  Result Value Ref Range Status   SARS Coronavirus 2 by RT PCR NEGATIVE NEGATIVE Final    Comment: (NOTE) SARS-CoV-2 target nucleic acids are NOT DETECTED.  The SARS-CoV-2 RNA is generally detectable in upper respiratoy specimens during the acute phase of infection. The lowest concentration of SARS-CoV-2 viral copies this assay can detect is 131 copies/mL. A negative result does not preclude SARS-Cov-2 infection and should not be used as the sole basis for treatment or other patient management decisions. A negative result may occur with  improper specimen collection/handling, submission of specimen other than nasopharyngeal swab, presence of viral mutation(s) within the areas targeted by this assay, and inadequate number of viral copies (<131 copies/mL). A negative result must be combined with clinical observations, patient history, and epidemiological information. The expected result is Negative.  Fact Sheet for Patients:  https://www.moore.com/  Fact Sheet for Healthcare Providers:  https://www.young.biz/  This test is no t yet approved or cleared by the [place] FDA and  has been authorized for detection and/or diagnosis of SARS-CoV-2 by FDA under an Emergency Use Authorization (EUA). This EUA will remain  in effect (meaning this test can be used) for the duration of the COVID-19 declaration under Section 564(b)(1) of the Act, 21 U.S.C. section 360bbb-3(b)(1), unless the authorization is terminated or revoked sooner.     Influenza A by PCR NEGATIVE NEGATIVE Final   Influenza B by PCR NEGATIVE NEGATIVE Final    Comment: (NOTE) The Xpert Xpress SARS-CoV-2/FLU/RSV assay is intended as an aid in  the diagnosis of influenza from Nasopharyngeal swab specimens and  should not be used as a sole basis for treatment. Nasal washings and   aspirates are unacceptable for Xpert Xpress SARS-CoV-2/FLU/RSV  testing.  Fact Sheet for Patients: https://www.moore.com/  Fact Sheet for Healthcare Providers: https://www.young.biz/  This test is not yet approved or cleared by the [place] FDA and  has been authorized for detection and/or  diagnosis of SARS-CoV-2 by  FDA under an Emergency Use Authorization (EUA). This EUA will remain  in effect (meaning this test can be used) for the duration of the  Covid-19 declaration under Section 564(b)(1) of the Act, 21  U.S.C. section 360bbb-3(b)(1), unless the authorization is  terminated or revoked. Performed at [facility], [address], [place], [place] [address]   Group A Strep by PCR     Status: None   Collection Time: 11/04/19  6:45 PM   Specimen: Throat; Sterile Swab  Result Value Ref Range Status   Group A Strep by PCR NOT DETECTED NOT DETECTED Final    Comment: Performed at [facility], [address], [place], [place] [address]     Radiology Studies: NM CARDIAC AMYLOID TUMOR LOC INFLAM SPECT 1 DAY  Result Date: 11/10/2019 CLINICAL DATA:  HEART FAILURE. CONCERN FOR CARDIAC AMYLOIDOSIS. EXAM: NUCLEAR MEDICINE TUMOR LOCALIZATION. PYP CARDIAC AMYLOIDOSIS SCAN WITH SPECT TECHNIQUE: Following intravenous administration of radiopharmaceutical, anterior planar images of the chest were obtained. Regions of interest were placed on the heart and contralateral chest wall for quantitative assessment. Additional SPECT imaging of the chest was obtained. RADIOPHARMACEUTICALS:  8 mCi TECHNETIUM 99 PYROPHOSPHATE FINDINGS: Planar Visual assessment: Anterior planar imaging demonstrates radiotracer uptake within the heart equal to uptake within the adjacent ribs (Grade 2). Quantitative assessment : Quantitative assessment of the cardiac uptake compared to the contralateral chest wall is equal to 1.64 (H/CL = 1.64  (manual recalculation) SPECT assessment: SPECT imaging of the chest demonstrates moderate radiotracer accumulation within the LEFT ventricle. 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 Electronically Signed   By: Genevive Bi M.D.   On: 11/10/2019 14:22   CARDIAC CATHETERIZATION  Result Date: 11/11/2019 1. Filling pressures optimized. 2. Low cardiac output. Will restart milrinone at 0.125   Scheduled Meds: . apixaban  5 mg Oral BID  . chlorhexidine  15 mL Mouth Rinse BID  . Chlorhexidine Gluconate Cloth  6 each Topical Daily  . digoxin  0.125 mg Oral Daily  . insulin aspart  0-9 Units Subcutaneous TID WC  . lidocaine  1 application Urethral Once  . mouth rinse  15 mL Mouth Rinse q12n4p  . [START ON 11/12/2019] simvastatin  40 mg Oral Daily  . sodium chloride flush  10-40 mL Intracatheter Q12H  . sodium chloride flush  3 mL Intravenous Q12H  . spironolactone  12.5 mg Oral Daily  . torsemide  20 mg Oral Daily   Continuous Infusions: . milrinone 0.125 mcg/kg/min (11/11/19 1019)     LOS: 6 days    Time spent: 25 mins.    Cipriano Bunker, MD [facility]   If 7PM-7AM, please contact night-coverage

## 2019-11-11 NOTE — Progress Notes (Signed)
[facility] EDUCATION NOTE:                VAD evaluation consent reviewed and signed by Hale Bogus and designated caregivers/neices Gabriel Rainwater and [name].  Initial VAD teaching completed with pt and his nieces.   VAD educational packet including "Understanding Your Options with Advanced Heart Failure", "[place] Patient Agreement for VAD Evaluation and Potential Implantation" consent, and [employer] "Living a More Active Life" HM III booklet", "[place] HM III Patient Education", "[place] Mechanical Circulatory Support Program", and "Decision Aids for Left Ventricular Assist Device" reviewed in detail and left at bedside for continued reference.    All questions answered regarding VAD implant, hospital stay, and what to expect when discharged home living with a heart pump.  Explained need for 24/7 care when pt is discharged home due to sternal precautions, adaptation to living on support, emotional support, consistent and meticulous exit site care and management, medication adherence and high volume of follow up visits with the VAD Clinic after discharge; both pt and caregiver verbalized understanding of above.   Explained that LVAD can be implanted for two indications in the setting of advanced left ventricular heart failure treatment:  1. Bridge to transplant - used for patients who cannot safely wait for heart transplant without this device.  Or    2. Destination therapy - used for patients until end of life or recovery of heart function.  Patient and caregiver(s) acknowledge that the indication at this point in time for LVAD therapy would be for destination therapy due to medical history.   Provided brief equipment overview and demonstration with HeartMate III training loop including discussion of VAD equipment.   Reviewed and supplied a copy of home inspection check list stressing that only three pronged grounded power outlets can be used for VAD equipment.  Confirmed home has electrical  outlets that will support the equipment along with access working telephone.  Extended the option to have one of our current patients and caregiver(s) come to talk with them about living on support to assist with decision making.   Reviewed pictures of VAD drive line, site care, dressing changes, and drive line stabilization including securement attachment device and abdominal binder. Discussed with pt and family that they will be required to purchase dressing supplies as long as patient has the VAD in place.   Intermacs patient survival statistics through March 2021 reviewed with patient and caregiver as follows:                                              The patient understands that from this discussion it does not mean that they will receive the device, but that depends on an extensive evaluation process. The patient is aware of the fact that if at anytime they want to stop the evaluation process they can.  All questions have been answered at this time and contact information was provided should they encounter any further questions.  They are all agreeable at this time to the evaluation process.   Pt noted to have significant functional limitations (right sided weakness with extremely limited use of right arm/hand and expressive aphasia) from prior stroke that would severely limit his ability to manage VAD equipment. Would need 24/7 caregiver. Discussed findings with Dr Shirlee Latch; will defer VAD evaluation at this time. Dr Shirlee Latch to discuss options with patient and  his family.    Alyce Pagan RN VAD Coordinator  Office: [phone]  24/7 Pager: [phone]

## 2019-11-11 NOTE — Progress Notes (Signed)
Pt with a low bp checked x 3 with lowest bp of 73/58. Pt is asymptomatic. Paged Cardiologist on call and stopped milrinone drip. Will continue to monitor pt & await further orders.

## 2019-11-12 ENCOUNTER — Encounter (HOSPITAL_COMMUNITY): Payer: Self-pay | Admitting: Cardiology

## 2019-11-12 DIAGNOSIS — I5023 Acute on chronic systolic (congestive) heart failure: Secondary | ICD-10-CM | POA: Diagnosis not present

## 2019-11-12 LAB — BASIC METABOLIC PANEL
Anion gap: 11 (ref 5–15)
BUN: 25 mg/dL — ABNORMAL HIGH (ref 8–23)
CO2: 25 mmol/L (ref 22–32)
Calcium: 9.2 mg/dL (ref 8.9–10.3)
Chloride: 97 mmol/L — ABNORMAL LOW (ref 98–111)
Creatinine, Ser: 1.16 mg/dL (ref 0.61–1.24)
GFR, Estimated: >60 mL/min (ref >60)
Glucose, Bld: 160 mg/dL — ABNORMAL HIGH (ref 70–99)
Potassium: 3.9 mmol/L (ref 3.5–5.1)
Sodium: 133 mmol/L — ABNORMAL LOW (ref 135–145)

## 2019-11-12 LAB — GLUCOSE, CAPILLARY
Glucose-Capillary: 174 mg/dL — ABNORMAL HIGH (ref 70–99)
Glucose-Capillary: 214 mg/dL — ABNORMAL HIGH (ref 70–99)
Glucose-Capillary: 111 mg/dL — ABNORMAL HIGH (ref 70–99)
Glucose-Capillary: 193 mg/dL — ABNORMAL HIGH (ref 70–99)

## 2019-11-12 LAB — HEMOGLOBIN AND HEMATOCRIT, BLOOD
HCT: 36.7 % — ABNORMAL LOW (ref 39.0–52.0)
Hemoglobin: 12.4 g/dL — ABNORMAL LOW (ref 13.0–17.0)

## 2019-11-12 LAB — COOXEMETRY PANEL
Carboxyhemoglobin: 1.2 % (ref 0.5–1.5)
Methemoglobin: 0.8 % (ref 0.0–1.5)
O2 Saturation: 63.1 %
Total hemoglobin: 13.1 g/dL (ref 12.0–16.0)

## 2019-11-12 LAB — DIGOXIN LEVEL: Digoxin Level: 0.6 ng/mL — ABNORMAL LOW (ref 1.0–2.0)

## 2019-11-12 LAB — MAGNESIUM: Magnesium: 2.4 mg/dL (ref 1.7–2.4)

## 2019-11-12 LAB — PHOSPHORUS: Phosphorus: 3.8 mg/dL (ref 2.5–4.6)

## 2019-11-12 MED ORDER — MIDODRINE HCL 5 MG PO TABS
2.5000 mg | ORAL_TABLET | Freq: Three times a day (TID) | ORAL | Status: DC
  Administered 2019-11-12 – 2019-11-16 (×14): 2.5 mg via ORAL
  Filled 2019-11-12 (×14): qty 1

## 2019-11-12 MED ORDER — MILRINONE LACTATE IN DEXTROSE 20-5 MG/100ML-% IV SOLN: 0.2500 ug/kg/min | INTRAVENOUS | Status: DC

## 2019-11-12 MED ORDER — MILRINONE LACTATE IN DEXTROSE 20-5 MG/100ML-% IV SOLN
0.2500 ug/kg/min | INTRAVENOUS | Status: DC
  Administered 2019-11-12 – 2019-11-15 (×3): 0.125 ug/kg/min via INTRAVENOUS
  Filled 2019-11-12 (×3): qty 100

## 2019-11-12 NOTE — Plan of Care (Signed)
?  Problem: Clinical Measurements: ?Goal: Will remain free from infection ?Outcome: Progressing ?  ?

## 2019-11-12 NOTE — Progress Notes (Addendum)
Patient ID: [name], male   DOB: May 07, 1955, 64 y.o.   MRN: [id number]     Advanced Heart Failure Rounding Note  PCP-Cardiologist: Verne Carrow, MD   Subjective:    Milrinone restarted yesterday at 0.125 after RHC showed low output. Apparently stopped overnight due to hypotension and switched to dopamine, briefly, which was then discontinued due to run of VT (report of 18 beats, however not seen on tele review, longest run saved was 7 beats). Milrinone restarted at 0.125. Co-ox 63% today.   Remains in chronic afib, w/ SVR 50s-low 60s. SBP 80s-90s. Mentation ok. No complaints.   - 1.6 L in UOP yesterday. Wt continues to trend down. CVP 9-10   Paracentesis (10/31) with 4.8 L out. O2 sats 100% on RA.    Cardiac Studies  RHC (11/4): RHC Procedural Findings: Hemodynamics (mmHg) RA mean 7 RV 29/6 PA 29/9, mean 17 PCWP mean 8 Oxygen saturations: PA 57% AO 100% Cardiac Output (Thermo) 2.66 Cardiac Index (Thermo) 1.61 Cardiac Output (Fick) 3.38  Cardiac Index (Fick) 2.05  Cardiac MRI: 1. Normal LV size with normal wall thickness. EF 24%, diffuse hypokinesis worse in the septum. 2. Mildly dilated RV with mildly decreased systolic function, EF 42%. 3.  Severe biatrial enlargement. 4. Extensive LGE in a non-coronary distribution throughout much of the LV, also involving RV free wall and left and right atrial walls. ECV 42%. This is suggestive of infiltrative disease. The walls are not thick, making amyloidosis and Fabry's less likely. Myocarditis is a consideration.  PYP scan: Grade 2, H/CL 1.64.  Suggestive of transthyretin amyloidosis.    Objective:   Weight Range: 55.6 kg Body mass index is 19.2 kg/m.   Vital Signs:   Temp:  [97.6 F (36.4 C)-99 F (37.2 C)] 99 F (37.2 C) (11/05 0710) Pulse Rate:  [43-84] 50 (11/05 0710) Resp:  [6-30] 16 (11/05 0710) BP: (72-116)/(42-81) 97/67 (11/05 0710) SpO2:  [0 %-100 %] 100 % (11/05 0710) Last BM Date:  11/09/19  Weight change: Filed Weights   11/09/19 0456 11/10/19 0617 11/11/19 0639  Weight: 61.3 kg 58.5 kg 55.6 kg    Intake/Output:   Intake/Output Summary (Last 24 hours) at 11/12/2019 0754 Last data filed at 11/12/2019 0512 Gross per 24 hour  Intake 287 ml  Output 1575 ml  Net -1288 ml      Physical Exam    CVP 9 General: well appearing, sitting up in bed. No distress  Neck: No JVD, no thyromegaly or thyroid nodule.  Lungs: slightly decreased BS on the left, clear on rt, no wheezing  CV: Nondisplaced PMI.  Irregularly irregular rhythm, slow rate.  no murmur.  No peripheral edema.  No carotid bruit.  Normal pedal pulses.  Abdomen: Soft, nontender, no hepatosplenomegaly, no distention.  Skin: Intact without lesions or rashes.  Neurologic: Alert and oriented x 3. + expressive aphasia and right-sided weakness (chronic, prior CVA) Psych: flat affect. Extremities: No clubbing or cyanosis. + LUE PICC  HEENT: Normal.    Telemetry   Chronic Atrial fibrillation 40s-60ss (personally reviewed)  Labs    CBC Recent Labs    11/10/19 0450 11/10/19 0450 11/11/19 0500 11/11/19 0500 11/11/19 0808 11/12/19 0106  WBC 5.9  --  6.5  --   --   --   NEUTROABS 4.4  --   --   --   --   --   HGB 11.1*   < > 11.6*   < > 13.3  13.6 12.4*  HCT 33.1*   < >  34.3*   < > 39.0  40.0 36.7*  MCV 93.2  --  92.0  --   --   --   PLT 200  --  203  --   --   --    < > = values in this interval not displayed.   Basic Metabolic Panel Recent Labs    30/16/01 0815 11/12/19 0106  NA 132* 133*  K 3.8 3.9  CL 95* 97*  CO2 27 25  GLUCOSE 113* 160*  BUN 20 25*  CREATININE 1.14 1.16  CALCIUM 8.8* 9.2  MG 1.9 2.4  PHOS 4.7* 3.8   Liver Function Tests No results for input(s): AST, ALT, ALKPHOS, BILITOT, PROT, ALBUMIN in the last 72 hours. No results for input(s): LIPASE, AMYLASE in the last 72 hours. Cardiac Enzymes No results for input(s): CKTOTAL, CKMB, CKMBINDEX, TROPONINI in the last 72  hours.  BNP: BNP (last 3 results) Recent Labs    07/12/19 1913 11/04/19 1818  BNP 1,991.9* 1,651.0*    ProBNP (last 3 results) No results for input(s): PROBNP in the last 8760 hours.   D-Dimer No results for input(s): DDIMER in the last 72 hours. Hemoglobin A1C No results for input(s): HGBA1C in the last 72 hours. Fasting Lipid Panel No results for input(s): CHOL, HDL, LDLCALC, TRIG, CHOLHDL, LDLDIRECT in the last 72 hours. Thyroid Function Tests No results for input(s): TSH, T4TOTAL, T3FREE, THYROIDAB in the last 72 hours.  Invalid input(s): FREET3  Other results:   Imaging    CARDIAC CATHETERIZATION  Result Date: 11/11/2019 1. Filling pressures optimized. 2. Low cardiac output. Will restart milrinone at 0.125    Medications:     Scheduled Medications: . apixaban  5 mg Oral BID  . chlorhexidine  15 mL Mouth Rinse BID  . Chlorhexidine Gluconate Cloth  6 each Topical Daily  . digoxin  0.125 mg Oral Daily  . insulin aspart  0-9 Units Subcutaneous TID WC  . lidocaine  1 application Urethral Once  . mouth rinse  15 mL Mouth Rinse q12n4p  . simvastatin  40 mg Oral Daily  . sodium chloride flush  10-40 mL Intracatheter Q12H  . sodium chloride flush  3 mL Intravenous Q12H  . spironolactone  12.5 mg Oral Daily  . torsemide  20 mg Oral Daily    Infusions: . milrinone 0.125 mcg/kg/min (11/12/19 0554)    PRN Medications: acetaminophen **OR** acetaminophen, lidocaine, loratadine, ondansetron **OR** ondansetron (ZOFRAN) IV, oxyCODONE, polyethylene glycol, sodium chloride flush   Assessment/Plan   1. Acute on chronic systolic CHF: Nonischemic cardiomyopathy known since 2018.  Improved initially in 2018 with DCCV, but has been back in atrial fibrillation since 2019 it appears and EF has been low.  I reviewed echo this admission, EF 15-20% with mod-severe RV dilation and moderate RV dysfunction, D-shaped septum, dilated IVC, severe biatrial enlargement. Significant  RV failure with ascites on CT abdomen, had paracentesis.  Cardiac MRI showed LV EF 24%, RV mildly dilated with EF 42%, extensive LGE in a non-coronary distribution throughout much of the LV, also involving RV free wall and left and right atrial walls, ECV 42%. This is suggestive of infiltrative disease. 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  He was initially on milrinone for low output HF, this was weaned off for DCCV which he failed. RHC 11/4 off milrinone showed optimized  filling pressures but low cardiac output, milrinone restarted at 0.125. Co-ox 63%. CVP 9. SBPs remain soft, 80s-90s. SCr ok.    - Continue digoxin.   - Continue spironolactone 12.5 daily  - BP too soft for ARB/ARNi - Continue torsemide 20 mg daily.    - add midodrine 2.5 mg tid for BP support  - Narrow QRS, not CRT candidate.  - Will need to think about LVAD.  Has barriers (expressive aphasia, deconditioning) but has family support.  I think his RV is ok based on cardiac MRI.   - if he fails milrinone wean again, may need home milrinone to bridge to VAD  2. Atrial fibrillation: Now chronic.  Initially in 2018, EF improved after DCCV.  However, as above, he is not in RVR so do not think that cardiomyopathy is primarily due to AF.  He failed DCCV on amiodarone this admission, I stopped amiodarone due to bradycardia. I think that we are not going to be able to get him out of atrial fibrillation.  - Continue Eliquis  3. Ascites: Large ascites on CT.  Suspect due to RV failure. Paracentesis on 10/31 with 4.8 L out. O2 sats stable on RA  4. H/o CVA: In 2010, with expressive aphasia and right-sided weakness. Per niece, he is at baseline.  - PT/OT.  5. Hyponatremia: Hypervolemic hypernatremia => resolved with 1 dose tolvaptan. Na 133 today, follow closely.  Fluid restrict.    Length of Stay: [address] PA-C  11/12/2019, 7:54 AM  Advanced Heart Failure Team Pager [phone] (M-F; 7a - 4p)  Please contact [facility] for night-coverage after hours (4p -7a ) and weekends on [email or web address]  Patient seen with PA, agree with the above note.   No complaints this afternoon, he is on milrinone 0.125 with HR in 50s-60s (atrial fibrillation) and SBP 90s.  Co-ox up to 63% back on milrinone and CVP 9.  I/Os negative, creatinine stable.    General: NAD Neck: JVP 8-9 cm, no thyromegaly or thyroid nodule.  Lungs: Clear to auscultation bilaterally with normal respiratory effort. CV: Nondisplaced PMI.  Heart irregular S1/S2, no S3/S4, 2/6 SEM RUSB.  No peripheral edema.   Abdomen: Soft, nontender, no hepatosplenomegaly, no distention.  Skin: Intact without lesions or rashes.  Neurologic: Alert and oriented x 3. Expressive aphasia.  Psych: Normal affect. Extremities: No clubbing or cyanosis.  HEENT: Normal.   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  PYP scan suggests TTR cardiac amyloidosis (though a dilated cardiomyopathy presentation which is unusual).  Low output on RHC off milrinone, milrinone restarted at 0.125 mcg/kg/min.  Co-ox improved today at 63%.  I had patient assessed by our LVAD nurse, she had significant concerns about his right arm/hand weakness.  He would be unable to manage his LVAD equipment and would need consistent 24 hr caregiver (not available).  I have discussed this with his family.  - He seems to feel better on milrinone.  I think a trial of home milrinone 0.125 mcg/kg/min would be reasonable.  Will make arrangements for this.  - Mild volume overload but improving.  Continue po torsemide.  - Can continue digoxin, level is ok and HR in 50s-60s.  - Agree with addition of midodrine 2.5 mg tid.  - Palliative care evaluation.  I told his niece that alternatively, hospice care would be reasonable if that is what he wants.   I  think he is ready at this point for SNF placement for rehab.  Milrinone gtt may  make this more difficult.   Marca Ancona 11/12/2019 1:12 PM

## 2019-11-12 NOTE — Progress Notes (Signed)
Pt with 18 beats of vtach. Pt asymptomatic.Paged cardiologist on call. RN to stop dopamine drip and switch pt back to milrinone drip.

## 2019-11-12 NOTE — Progress Notes (Signed)
PROGRESS NOTE    [name]  BDZ:[id number] DOB: 08-20-1955 DOA: 11/04/2019 PCP: Mirna Mires, MD     Brief Narrative:  64 year old male with a history of stroke with expressive aphasia, right-sided paralysis, nonischemic cardiomyopathy, systolic CHF EF 20 to 25% as of April 2021 echo, hypertension, diabetes mellitus type 2, persistent atrial flutter/atrial fibrillation came to ED with chief complaints of dyspnea.  Patient became progressively more dyspneic at home.  He had been ambulating with cane and started developing worsening distention/swelling in the abdomen.  His Lasix dosing was increased outpatient per his cardiologist with minimal improvement.  Due to progressively worsening dyspnea and abdominal swelling he was brought to the ED for further evaluation. He was found to have elevated BNP, indeterminate troponins.  Initially started on Lasix for diuresis and abdominal ascites. He became more hypotensive and bradycardic requiring discontinuation of Lasix.  Cardiology was consulted for concern of development cardiogenic shock.  Cardiology recommended transfer to [facility] for evaluation by Heart failure team. Entresto, metoprolol, Lasix are currently on hold. Patient underwent DCCV 11/3 , failed to convert , remains in A. Fibrillation. 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  Midodrine was added for low BP, He seems to feel better on milrinone, Plan:Trail of home milrinone , that will be arranged.  Assessment & Plan:   Principal Problem:   Acute on chronic systolic CHF (congestive heart failure) (HCC) Active Problems:   HLD (hyperlipidemia)   History of stroke   Type 2 diabetes mellitus without complication, without long-term current use of insulin (HCC)   Persistent atrial fibrillation (HCC)   Cardiogenic shock (HCC)   Hyponatremia   Hypothermia   1. Acute on chronic systolic CHF-  Echo in 2021 showed EF 20 to 25%, repeat echo  done during this hospitalization showed EF of 15 to 20%.  BNP 1651.  Initially started on Lasix however became bradycardic and hypotensive so Lasix was held.  Likely cardiogenic shock.  Heart failure team consulted.  Patient started on milrinone and IV Lasix.  Patient underwent DCCV 11/3,  unable to convert,  remains in A. Fib. Lasix transitioned to torsemide. 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  He seems to feel better on milrinone, Plan: trial of home milrinone   2. Ascites- s/p paracentesis on 11/07/2019 with 4.8 liters fluid removal.  Likely from right heart failure.  3. Cardiogenic shock- Patient was started on IV Lasix however became bradycardic and hypotensive, Lasix was held and patient was transferred to [facility] for evaluation by heart failure team.  At this time he is on milrinone , Lasix switched to torsemide.  Heart failure team following. Midodrine was added for low BP, Plan: Trial of home milrinone  4. Hyponatremia- Improving , secondary likely hypervolemic hyponatremia from underlying CHF.  Sodium has improved today to 133.  He received 1 dose of tolvaptan on 11/9.  Continue diuresis with inotropic support as above.  We will follow serum sodium level in a.m.    5. Persistent atrial fibrillation-beta-blockers on hold due to hypotension.  Continue digoxin and Eliquis.  Amiodarone discontinued.  Patient underwent DCCV, unable to convert remains in A. fib.  6. Diabetes mellitus type 2- He was hypoglycemic on admission, continue hypoglycemic protocol.  Sliding scale insulin with NovoLog.  7. History of stroke-residual severe expressive aphasia, has right-sided paralysis. Remains at baseline.    DVT prophylaxis: Eliquis Code Status: Full Family Communication: No one at bed side. Disposition Plan:  Status is: Inpatient  Remains inpatient appropriate because:Inpatient level of care appropriate due to severity of  illness   Dispo: The patient is from: Home              Anticipated d/c is to: SNF              Anticipated d/c date is: 2 days              Patient currently is not medically stable to d/c.   Consultants:   Cardiology  Procedures:Cardioversion  Antimicrobials:  Anti-infectives (From admission, onward)   None      Subjective: Patient was seen and examined at bedside,  overnight events noted.  He became hypotensive, milrinone discontinued, dopamine started briefly but discontinued due to runs of VT,  Milrinone resumed. Patient reports feeling better,  denies any chest pain,  dizziness or shortness of breath. He is s/p DCCV remains in a/ fib unable to convert.    Objective: Vitals:   11/12/19 0459 11/12/19 0710 11/12/19 0935 11/12/19 1243  BP: (!) 95/58 97/67 105/61 91/72  Pulse: 60 (!) 50 (!) 53 (!) 53  Resp: 18 16 18 20   Temp: 97.6 F (36.4 C) 99 F (37.2 C)    TempSrc: Oral Oral  Oral  SpO2: 98% 100% 100% 100%  Weight:      Height:        Intake/Output Summary (Last 24 hours) at 11/12/2019 1550 Last data filed at 11/12/2019 1100 Gross per 24 hour  Intake 237 ml  Output 1875 ml  Net -1638 ml   Filed Weights   11/09/19 0456 11/10/19 0617 11/11/19 0639  Weight: 61.3 kg 58.5 kg 55.6 kg    Examination:  General exam: Appears calm and comfortable,   Respiratory system: Clear to auscultation. Respiratory effort normal. Cardiovascular system: S1 & S2 heard, irregular rhythm. No JVD, murmurs, rubs, gallops or clicks. No pedal edema. Gastrointestinal system: Abdomen is nondistended, soft and nontender. No organomegaly or masses felt. Normal bowel sounds heard. Central nervous system: Alert and oriented.  Expressive aphasia, right sided weakness. Extremities: No edema, no cyanosis, no clubbing. Skin: No rashes, lesions or ulcers Psychiatry: Judgement and insight appear normal. Mood & affect appropriate.     Data Reviewed: I have personally reviewed following labs  and imaging studies  CBC: Recent Labs  Lab 11/06/19 0526 11/06/19 0526 11/08/19 0553 11/08/19 0553 11/09/19 0608 11/10/19 0450 11/11/19 0500 11/11/19 0808 11/12/19 0106  WBC 6.1  --  5.5  --  5.8 5.9 6.5  --   --   NEUTROABS 4.7  --  4.2  --  4.3 4.4  --   --   --   HGB 12.4*   < > 11.3*   < > 10.9* 11.1* 11.6* 13.3  13.6 12.4*  HCT 36.9*   < > 33.1*   < > 32.0* 33.1* 34.3* 39.0  40.0 36.7*  MCV 93.4  --  93.0  --  93.0 93.2 92.0  --   --   PLT 213  --  200  --  186 200 203  --   --    < > = values in this interval not displayed.   Basic Metabolic Panel: Recent Labs  Lab 11/08/19 0553 11/08/19 0553 11/09/19 0608 11/10/19 0450 11/11/19 0808 11/11/19 0815 11/12/19 0106  NA 135   < > 134* 132* 134*  134* 132* 133*  K 4.1   < > 3.7 3.8 3.8  3.8 3.8 3.9  CL 103  --  102 94*  --  95* 97*  CO2 24  --  26 26  --  27 25  GLUCOSE 116*  --  113* 135*  --  113* 160*  BUN 13  --  15 18  --  20 25*  CREATININE 1.02  --  1.07 1.06  --  1.14 1.16  CALCIUM 8.7*  --  8.4* 8.6*  --  8.8* 9.2  MG 2.0  --  1.8 2.1  --  1.9 2.4  PHOS  --   --   --   --   --  4.7* 3.8   < > = values in this interval not displayed.   GFR: Estimated Creatinine Clearance: 50.6 mL/min (by C-G formula based on SCr of 1.16 mg/dL). Liver Function Tests: Recent Labs  Lab 11/07/19 1547  AST 39  ALT 20  ALKPHOS 163*  BILITOT 2.0*  PROT 6.4*  ALBUMIN 3.2*   No results for input(s): LIPASE, AMYLASE in the last 168 hours. No results for input(s): AMMONIA in the last 168 hours. Coagulation Profile: Recent Labs  Lab 11/10/19 0450  INR 1.6*   Cardiac Enzymes: No results for input(s): CKTOTAL, CKMB, CKMBINDEX, TROPONINI in the last 168 hours. BNP (last 3 results) No results for input(s): PROBNP in the last 8760 hours. HbA1C: No results for input(s): HGBA1C in the last 72 hours. CBG: Recent Labs  Lab 11/11/19 1101 11/11/19 1649 11/11/19 2146 11/12/19 0745 11/12/19 1139  GLUCAP 133* 120* 207*  174* 214*   Lipid Profile: No results for input(s): CHOL, HDL, LDLCALC, TRIG, CHOLHDL, LDLDIRECT in the last 72 hours. Thyroid Function Tests: No results for input(s): TSH, T4TOTAL, FREET4, T3FREE, THYROIDAB in the last 72 hours. Anemia Panel: No results for input(s): VITAMINB12, FOLATE, FERRITIN, TIBC, IRON, RETICCTPCT in the last 72 hours. Sepsis Labs: No results for input(s): PROCALCITON, LATICACIDVEN in the last 168 hours.  Recent Results (from the past 240 hour(s))  Respiratory Panel by RT PCR (Flu A&B, Covid) - Nasopharyngeal Swab     Status: None   Collection Time: 11/04/19  6:21 PM   Specimen: Nasopharyngeal Swab  Result Value Ref Range Status   SARS Coronavirus 2 by RT PCR NEGATIVE NEGATIVE Final    Comment: (NOTE) SARS-CoV-2 target nucleic acids are NOT DETECTED.  The SARS-CoV-2 RNA is generally detectable in upper respiratoy specimens during the acute phase of infection. The lowest concentration of SARS-CoV-2 viral copies this assay can detect is 131 copies/mL. A negative result does not preclude SARS-Cov-2 infection and should not be used as the sole basis for treatment or other patient management decisions. A negative result may occur with  improper specimen collection/handling, submission of specimen other than nasopharyngeal swab, presence of viral mutation(s) within the areas targeted by this assay, and inadequate number of viral copies (<131 copies/mL). A negative result must be combined with clinical observations, patient history, and epidemiological information. The expected result is Negative.  Fact Sheet for Patients:  https://www.moore.com/  Fact Sheet for Healthcare Providers:  https://www.young.biz/  This test is no t yet approved or cleared by the [place] FDA and  has been authorized for detection and/or diagnosis of SARS-CoV-2 by FDA under an Emergency Use Authorization (EUA). This EUA will remain  in  effect (meaning this test can be used) for the duration of the COVID-19 declaration under Section 564(b)(1) of the Act, 21 U.S.C. section 360bbb-3(b)(1), unless the authorization is terminated or revoked sooner.     Influenza  A by PCR NEGATIVE NEGATIVE Final   Influenza B by PCR NEGATIVE NEGATIVE Final    Comment: (NOTE) The Xpert Xpress SARS-CoV-2/FLU/RSV assay is intended as an aid in  the diagnosis of influenza from Nasopharyngeal swab specimens and  should not be used as a sole basis for treatment. Nasal washings and  aspirates are unacceptable for Xpert Xpress SARS-CoV-2/FLU/RSV  testing.  Fact Sheet for Patients: https://www.moore.com/  Fact Sheet for Healthcare Providers: https://www.young.biz/  This test is not yet approved or cleared by the [place] FDA and  has been authorized for detection and/or diagnosis of SARS-CoV-2 by  FDA under an Emergency Use Authorization (EUA). This EUA will remain  in effect (meaning this test can be used) for the duration of the  Covid-19 declaration under Section 564(b)(1) of the Act, 21  U.S.C. section 360bbb-3(b)(1), unless the authorization is  terminated or revoked. Performed at [facility], [address] [place], [place] [address]   Group A Strep by PCR     Status: None   Collection Time: 11/04/19  6:45 PM   Specimen: Throat; Sterile Swab  Result Value Ref Range Status   Group A Strep by PCR NOT DETECTED NOT DETECTED Final    Comment: Performed at [facility], [address], [place], [place] [address]     Radiology Studies: CARDIAC CATHETERIZATION  Result Date: 11/11/2019 1. Filling pressures optimized. 2. Low cardiac output. Will restart milrinone at 0.125   Scheduled Meds: . apixaban  5 mg Oral BID  . chlorhexidine  15 mL Mouth Rinse BID  . Chlorhexidine Gluconate Cloth  6 each Topical Daily  . digoxin  0.125 mg Oral Daily  . insulin  aspart  0-9 Units Subcutaneous TID WC  . lidocaine  1 application Urethral Once  . mouth rinse  15 mL Mouth Rinse q12n4p  . midodrine  2.5 mg Oral TID WC  . simvastatin  40 mg Oral Daily  . sodium chloride flush  10-40 mL Intracatheter Q12H  . sodium chloride flush  3 mL Intravenous Q12H  . spironolactone  12.5 mg Oral Daily  . torsemide  20 mg Oral Daily   Continuous Infusions: . milrinone 0.125 mcg/kg/min (11/12/19 0554)     LOS: 7 days    Time spent: 35 mins.    Cipriano Bunker, MD [facility]   If 7PM-7AM, please contact night-coverage

## 2019-11-12 NOTE — TOC Progression Note (Addendum)
Transition of Care [facility]) - Progression Note    Patient Details  Name: [name] MRN: [id number] Date of Birth: [birth year]  [facility] CM/SW Contact  Terrial Rhodes, [place] Phone Number: 11/12/2019, 2:47 PM  Clinical Narrative:      Update 11/5 4:42pm- CSW spoke with Aram Beecham with [facility] who confirmed she received fax of the progress note for DON to review. Aram Beecham said she sent it to DON and awaiting response to see if they can still take patient on home Milrinone drip.   CSW will continue to follow.  CSW spoke with [place] with [employer]. Aram Beecham confirmed she received insurance authorization for patient. CSW asked Aram Beecham if they can accept patient on home milrinone drip. Aram Beecham asked CSW to fax over recent progress note regarding milrinone drip. CSW faxed over most recent progress note to Aram Beecham with [facility] to see if they can still accept patient for SNF placement.  CSW awaiting call back from [place] with decision.  Expected Discharge Plan: Skilled Nursing Facility Barriers to Discharge: Continued Medical Work up  Expected Discharge Plan and Services Expected Discharge Plan: Skilled Nursing Facility       Living arrangements for the past 2 months: Single Family Home                                       Social Determinants of Health (SDOH) Interventions    Readmission Risk Interventions No flowsheet data found.

## 2019-11-12 NOTE — Progress Notes (Signed)
   11/11/19 2124  Assess: MEWS Score  Temp 98 F (36.7 C)  BP (!) 72/42  Pulse Rate 60  ECG Heart Rate 60  Resp 20  Level of Consciousness Alert  SpO2 98 %  O2 Device Room Air  Patient Activity (if Appropriate) In bed  Assess: MEWS Score  MEWS Temp 0  MEWS Systolic 2  MEWS Pulse 0  MEWS RR 0  MEWS LOC 0  MEWS Score 2  MEWS Score Color Yellow  Assess: if the MEWS score is Yellow or Red  Were vital signs taken at a resting state? Yes  Focused Assessment No change from prior assessment  Early Detection of Sepsis Score *See Row Information* Low  MEWS guidelines implemented *See Row Information* Yes  Treat  MEWS Interventions Administered scheduled meds/treatments;Escalated (See documentation below)  Pain Scale Faces  Pain Score 0  Faces Pain Scale 0  Take Vital Signs  Increase Vital Sign Frequency  Yellow: Q 2hr X 2 then Q 4hr X 2, if remains yellow, continue Q 4hrs  Escalate  MEWS: Escalate Yellow: discuss with charge nurse/RN and consider discussing with provider and RRT  Notify: Charge Nurse/RN  Name of Charge Nurse/RN Notified shanelle  Date Charge Nurse/RN Notified 11/11/19  Time Charge Nurse/RN Notified 2124  Notify: Provider  Provider Name/Title dr. Mackie Pai  Date Provider Notified 11/11/19  Time Provider Notified 2124  Notification Type Page  Notification Reason Change in status  Response See new orders  Date of Provider Response 11/11/19  Time of Provider Response 2125  Document  Patient Outcome Stabilized after interventions  Progress note created (see row info) Yes

## 2019-11-13 DIAGNOSIS — I5023 Acute on chronic systolic (congestive) heart failure: Secondary | ICD-10-CM | POA: Diagnosis not present

## 2019-11-13 LAB — GLUCOSE, CAPILLARY
Glucose-Capillary: 109 mg/dL — ABNORMAL HIGH (ref 70–99)
Glucose-Capillary: 185 mg/dL — ABNORMAL HIGH (ref 70–99)
Glucose-Capillary: 153 mg/dL — ABNORMAL HIGH (ref 70–99)
Glucose-Capillary: 158 mg/dL — ABNORMAL HIGH (ref 70–99)

## 2019-11-13 LAB — COMPREHENSIVE METABOLIC PANEL
ALT: 48 U/L — ABNORMAL HIGH (ref 0–44)
AST: 89 U/L — ABNORMAL HIGH (ref 15–41)
Albumin: 3.3 g/dL — ABNORMAL LOW (ref 3.5–5.0)
Alkaline Phosphatase: 185 U/L — ABNORMAL HIGH (ref 38–126)
Anion gap: 11 (ref 5–15)
BUN: 30 mg/dL — ABNORMAL HIGH (ref 8–23)
CO2: 23 mmol/L (ref 22–32)
Calcium: 8.6 mg/dL — ABNORMAL LOW (ref 8.9–10.3)
Chloride: 100 mmol/L (ref 98–111)
Creatinine, Ser: 1.33 mg/dL — ABNORMAL HIGH (ref 0.61–1.24)
GFR, Estimated: 60 mL/min — ABNORMAL LOW (ref >60)
Glucose, Bld: 180 mg/dL — ABNORMAL HIGH (ref 70–99)
Potassium: 3.9 mmol/L (ref 3.5–5.1)
Sodium: 134 mmol/L — ABNORMAL LOW (ref 135–145)
Total Bilirubin: 1.1 mg/dL (ref 0.3–1.2)
Total Protein: 6.8 g/dL (ref 6.5–8.1)

## 2019-11-13 LAB — PHOSPHORUS: Phosphorus: 3.6 mg/dL (ref 2.5–4.6)

## 2019-11-13 LAB — COOXEMETRY PANEL
Carboxyhemoglobin: 1.2 % (ref 0.5–1.5)
Methemoglobin: 0.8 % (ref 0.0–1.5)
O2 Saturation: 57.7 %
Total hemoglobin: 10.9 g/dL — ABNORMAL LOW (ref 12.0–16.0)

## 2019-11-13 LAB — HEMOGLOBIN AND HEMATOCRIT, BLOOD
HCT: 33.3 % — ABNORMAL LOW (ref 39.0–52.0)
Hemoglobin: 11 g/dL — ABNORMAL LOW (ref 13.0–17.0)

## 2019-11-13 LAB — MAGNESIUM: Magnesium: 2.1 mg/dL (ref 1.7–2.4)

## 2019-11-13 NOTE — Consult Note (Signed)
Consultation Note Date: 11/13/2019   Patient Name: [name]  DOB: 10/22/1955  MRN: [id number]  Age / Sex: 64 y.o., male  PCP: Iona Beard, MD Referring Physician: Shawna Clamp, MD  Reason for Consultation: Establishing goals of care  HPI/Patient Profile: 64 y.o. 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  He was found to be in an acute heart failure exacerbation.   Shortly after admission he underwent paracentesis with removal of 4.8L of fluid. On 11/3 DCCV was conducted and on 11/4 he had a RHC.  He was found to have low output right sided heart failure.  His LVEF is 15-20%. He was started on milrinone but was unable to tolerate it due to hypotension.  The milrinone was discontinued and dopamine was started.  Unfortunately on dopamine he had runs of VTach.  Milrinone has since been restarted and the patient appears comfortable.  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  Clinical Assessment and Goals of Care:  I have reviewed medical records including EPIC notes, labs and imaging, received report from the care team, examined the patient and met at bedside with the patient and his brother  to discuss diagnosis prognosis, GOC, EOL wishes, disposition and options.  I introduced Palliative Medicine as specialized medical care for people living with serious illness. It focuses on providing relief from the symptoms and stress of a serious illness.   We discussed a brief life review of the patient.  [name] lives at home with his brother [name].  He is very close to his two nieces [name] and [name] who help care for him as well.  Per Blenda Bridegroom has been living with him and was able to walk and care for himself at home alone when [name] was at work  prior to admission.    [name] tells me that [name] has a great appetite.    Quenton answers questions as well.  He has a had time speaking and will provide simple answers.  He is not in any discomfort, he has been having bowel movements and urinating appropriately.  He indicates to me that he would like to get out of bed and walk around.  [name] asks about the Milrinone drip - it seems awfully big and inconvenient?  Will it be smaller at home and allow [name] to move about the house?  I explained that [name]'s heart is profoundly weak.  We need to request a PT consult to determine what he may be able to do when he leaves the hospital.   I talked with [name] and [name] about Hospice services in the home.  [name] is familiar with Hospice services.  He has lost two siblings and his mother.   He is not opposed to Hospice services but asks me to give his daughter [name] a call and discuss it with her.  We talked about Health Care Decision making Surrogate.  Advit indicates that he would want his niece [name] to make medical decisions for him if he was unable to communicate with [name].   I then asked about code status after explaining that if his heart arrested and we were successful is restarting it - it is highly likely he would arrest again quickly.  [name] and [name] thought about this question.  [name] indicated that he would lean towards DNR, but he asked me to discuss code status with [name] as well.  Hospice and Palliative Care services outpatient were explained and offered.  Questions and concerns were addressed.  The family was encouraged to call with questions or concerns.    Primary Decision Maker:  NEXT OF KIN Patient in combination with [name], niece.    SUMMARY OF RECOMMENDATIONS    Have attempted to call [name].  Need to discuss HCPOA, Code Status and possibly [facility] with her. Patient intends to return home with his brother. He would like to attempt to get up and walk - will order PT  consultation. He is a good candidate for Hospice in the home.  Perhaps thru [facility] if they can manage the milrinone gtt.  Otherwise he needs Home Health services with Palliative follow up outpatient.  Code Status/Advance Care Planning:  Full, needs to be discussed with [name].  [name] (brother) inclined towards DNR as heart is too weak to function again if he arrested.   Symptom Management:   Per primary.  Patient is comfortable.  Additional Recommendations (Limitations, Scope, Preferences):  Full Scope Treatment   Psycho-social/Spiritual:   Desire for further Chaplaincy support: Not discussed.  Patient is a Engineer, manufacturing.   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  Discharge Planning: Home with Hospice vs Home with home health and Palliative.      Primary Diagnoses: Present on Admission: . Persistent atrial fibrillation ([place]) . HLD (hyperlipidemia)   I have reviewed the medical record, interviewed the patient and family, and examined the patient. The following aspects are pertinent.  Past Medical History:  Diagnosis Date  . Atrial flutter ([place])   . CHF (congestive heart failure) ([place])   . Diabetes mellitus   . Hypertension   . NICM (nonischemic cardiomyopathy) ([place]) 08/2016   Mild, nonobstructive CAD at cath, EF initially 20%, improved to 50% 11/2016 echo  . Stroke [facility])    Social History   Socioeconomic History  . Marital status: Single    Spouse name: Not on file  . Number of children: Not on file  . Years of education: Not on file  . Highest education level: Not on file  Occupational History  . Not on file  Tobacco Use  . Smoking status: Never Smoker  . Smokeless tobacco: Never Used  Vaping Use  . Vaping Use: Never used  Substance and Sexual Activity  . Alcohol use: No  . Drug use: No  . Sexual activity: Not Currently  Other Topics Concern  . Not on file  Social History Narrative  . Not on file   Social  Determinants of Health   Financial Resource Strain:   . Difficulty of Paying Living Expenses: Not on file  Food Insecurity:   . Worried About Charity fundraiser in the Last Year: Not on file  . Ran Out of Food in the Last Year: Not on file  Transportation Needs:   . Lack of Transportation (Medical): Not on file  . Lack of Transportation (Non-Medical): Not on file  Physical Activity:   . Days of Exercise per Week: Not on file  . Minutes of Exercise per Session: Not on file  Stress:   . Feeling of Stress : Not on file  Social Connections:   . Frequency of Communication with Friends and Family: Not on file  . Frequency of Social Gatherings with Friends and Family: Not on file  . Attends Religious Services: Not on file  . Active Member of Clubs or Organizations: Not on file  . Attends Archivist Meetings: Not on file  . Marital Status: Not on file   Family History  Problem Relation Age of Onset  . Hypertension Mother   . Diabetes Mother   . Heart disease Father     No Known Allergies    Vital Signs: BP (!) 94/55   Pulse (!) 50   Temp 97.8 F (36.6 C) (Oral)   Resp 16   Ht '5\' 7"'  (1.702 m)   Wt 54.5 kg   SpO2 100%   BMI 18.82 kg/m  Pain Scale: 0-10   Pain Score: 0-No pain (per pt)   SpO2: SpO2: 100 % O2 Device:SpO2: 100 % O2 Flow Rate: .O2 Flow Rate (L/min): 1 L/min    Palliative Assessment/Data: 30%     Time In: 1:00 Time Out: 2:00 Time Total: 60 min. Visit consisted of counseling and education dealing with the complex and emotionally intense issues surrounding the need for palliative care and symptom management in the setting of serious and potentially life-threatening illness. Greater than 50%  of this time was spent counseling and coordinating care related to the above assessment and plan.  Signed by: Florentina Jenny, PA-C Palliative Medicine  Please contact Palliative Medicine Team phone at [phone] for questions and concerns.  For  individual provider: See Shea Evans

## 2019-11-13 NOTE — Progress Notes (Signed)
Progress Note  Patient Name: [name] Date of Encounter: 11/13/2019  Primary Cardiologist: Verne Carrow, MD Advanced Heart Failure Provider: Marca Ancona, MD  Subjective   Patient reports no shortness of breath, communication is difficult with expressive aphasia from prior stroke.  Inpatient Medications    Scheduled Meds: . apixaban  5 mg Oral BID  . chlorhexidine  15 mL Mouth Rinse BID  . Chlorhexidine Gluconate Cloth  6 each Topical Daily  . digoxin  0.125 mg Oral Daily  . insulin aspart  0-9 Units Subcutaneous TID WC  . lidocaine  1 application Urethral Once  . mouth rinse  15 mL Mouth Rinse q12n4p  . midodrine  2.5 mg Oral TID WC  . simvastatin  40 mg Oral Daily  . sodium chloride flush  10-40 mL Intracatheter Q12H  . sodium chloride flush  3 mL Intravenous Q12H  . spironolactone  12.5 mg Oral Daily  . torsemide  20 mg Oral Daily   Continuous Infusions: . milrinone 0.125 mcg/kg/min (11/12/19 0554)   PRN Meds: acetaminophen **OR** acetaminophen, lidocaine, loratadine, ondansetron **OR** ondansetron (ZOFRAN) IV, oxyCODONE, polyethylene glycol, sodium chloride flush   Vital Signs    Vitals:   11/12/19 2300 11/13/19 0001 11/13/19 0451 11/13/19 0813  BP:  90/66 90/88 (!) 94/55  Pulse: (!) 53   (!) 50  Resp: 16   16  Temp:  98.7 F (37.1 C) 98.7 F (37.1 C) 97.8 F (36.6 C)  TempSrc:  Oral Oral Oral  SpO2: 100%   100%  Weight:   54.5 kg   Height:        Intake/Output Summary (Last 24 hours) at 11/13/2019 1031 Last data filed at 11/13/2019 0452 Gross per 24 hour  Intake --  Output 950 ml  Net -950 ml   Filed Weights   11/10/19 0617 11/11/19 0639 11/13/19 0451  Weight: 58.5 kg 55.6 kg 54.5 kg    Telemetry    Atrial fibrillation with slow rates.  Personally reviewed.  ECG    An ECG dated 11/10/2019 was personally reviewed today and demonstrated:  Course atrial fibrillation with slow ventricular response.  Physical Exam   GEN: No  acute distress.   Neck: No JVD. Cardiac:  Irregularly irregular, no gallop.  Respiratory: Nonlabored. Clear to auscultation bilaterally. GI: Soft, nontender, bowel sounds present. MS: No edema; No deformity. Neuro:   Expressive aphasia and right-sided weakness. Psych: Flat affect.  Labs    Chemistry Recent Labs  Lab 11/07/19 1547 11/07/19 2103 11/11/19 0815 11/12/19 0106 11/13/19 0046  NA  --    < > 132* 133* 134*  K  --    < > 3.8 3.9 3.9  CL  --    < > 95* 97* 100  CO2  --    < > 27 25 23   GLUCOSE  --    < > 113* 160* 180*  BUN  --    < > 20 25* 30*  CREATININE  --    < > 1.14 1.16 1.33*  CALCIUM  --    < > 8.8* 9.2 8.6*  PROT 6.4*  --   --   --  6.8  ALBUMIN 3.2*  --   --   --  3.3*  AST 39  --   --   --  89*  ALT 20  --   --   --  48*  ALKPHOS 163*  --   --   --  185*  BILITOT 2.0*  --   --   --  1.1  GFRNONAA  --    < > >60 >60 60*  ANIONGAP  --    < > 10 11 11    < > = values in this interval not displayed.     Hematology Recent Labs  Lab 11/09/19 0608 11/09/19 0608 11/10/19 0450 11/10/19 0450 11/11/19 0500 11/11/19 0500 11/11/19 0808 11/12/19 0106 11/13/19 0046  WBC 5.8  --  5.9  --  6.5  --   --   --   --   RBC 3.44*  --  3.55*  --  3.73*  --   --   --   --   HGB 10.9*   < > 11.1*   < > 11.6*   < > 13.3  13.6 12.4* 11.0*  HCT 32.0*   < > 33.1*   < > 34.3*   < > 39.0  40.0 36.7* 33.3*  MCV 93.0  --  93.2  --  92.0  --   --   --   --   MCH 31.7  --  31.3  --  31.1  --   --   --   --   MCHC 34.1  --  33.5  --  33.8  --   --   --   --   RDW 15.5  --  15.2  --  14.7  --   --   --   --   PLT 186  --  200  --  203  --   --   --   --    < > = values in this interval not displayed.    Cardiac Enzymes Recent Labs  Lab 11/04/19 1818 11/04/19 2142  TROPONINIHS 26* 24*    Radiology    No results found.  Cardiac Studies   Echocardiogram 11/05/2019: 1. Left ventricular ejection fraction, by estimation, is 15-20%. The left  ventricle has  severely decreased function. The left ventricle demonstrates  global hypokinesis. Left ventricular diastolic function could not be  evaluated. Elevated left atrial  pressure. There is the interventricular septum is flattened in diastole  ('D' shaped left ventricle), consistent with right ventricular volume  overload.  2. Right ventricular systolic function is severely reduced. The right  ventricular size is severely enlarged. There is mildly elevated pulmonary  artery systolic pressure. The estimated right ventricular systolic  pressure is 39.8 mmHg.  3. Left atrial size was severely dilated.  4. Right atrial size was severely dilated.  5. The mitral valve is grossly normal. Mild mitral valve regurgitation.  No evidence of mitral stenosis.  6. The tricuspid valve is abnormal. Tricuspid valve regurgitation is  severe.  7. The aortic valve is tricuspid. Aortic valve regurgitation is not  visualized. No aortic stenosis is present.  8. The inferior vena cava is dilated in size with <50% respiratory  variability, suggesting right atrial pressure of 15 mmHg.   Right heart catheterization 11/11/2019: Hemodynamics (mmHg) RA mean 7 RV 29/6 PA 29/9, mean 17 PCWP mean 8  Oxygen saturations: PA 57% AO 100%  Cardiac Output (Thermo) 2.66 Cardiac Index (Thermo) 1.61  Cardiac Output (Fick) 3.38  Cardiac Index (Fick) 2.05  Patient Profile     64 y.o. 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  Assessment & Plan    1.  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  Placed on milrinone after recent right heart catheterization with plan to continue at SNF ultimately.  Felt not to be a good  candidate for LVAD for review Dr. Alford Highland note.  Volume status has been improving, approximately 2200 cc out more than in last 24 hours and weight coming down.  Co-ox 58 today down from 63 yesterday.  Creatinine is bumping up somewhat.  Systolic blood pressure relatively stable in the 90-100 range.  2.  Persistent, coarse atrial fibrillation.  Continues on Eliquis for stroke prophylaxis.  He is having slow rates, possibly conduction system disease in the setting of his infiltrative cardiomyopathy.  Amiodarone was already discontinued given failure to hold sinus rhythm.  If he develops symptomatic bradycardia may need to stop Lanoxin as well.  Plan to hold Demadex tomorrow, already got dose today.  Recheck BMET in a.m.  Continue milrinone at 0.125 mcg/kg/min.  Recheck  Co-ox in AM. Continue Aldactone and ProAmatine.  No change in the digoxin, but this may need to be discontinued if he has progressive/symptomatic bradycardia in atrial fibrillation.  Signed, Nona Dell, MD  11/13/2019, 10:31 AM

## 2019-11-13 NOTE — Progress Notes (Signed)
PROGRESS NOTE    [name]  BDZ:[id number] DOB: 08-20-1955 DOA: 11/04/2019 PCP: Mirna Mires, MD     Brief Narrative:  64 year old male with a history of stroke with expressive aphasia, right-sided paralysis, nonischemic cardiomyopathy, systolic CHF EF 20 to 25% as of April 2021 echo, hypertension, diabetes mellitus type 2, persistent atrial flutter/atrial fibrillation came to ED with chief complaints of dyspnea.  Patient became progressively more dyspneic at home.  He had been ambulating with cane and started developing worsening distention/swelling in the abdomen.  His Lasix dosing was increased outpatient per his cardiologist with minimal improvement.  Due to progressively worsening dyspnea and abdominal swelling he was brought to the ED for further evaluation. He was found to have elevated BNP, indeterminate troponins.  Initially started on Lasix for diuresis and abdominal ascites. He became more hypotensive and bradycardic requiring discontinuation of Lasix.  Cardiology was consulted for concern of development cardiogenic shock.  Cardiology recommended transfer to [facility] for evaluation by Heart failure team. Entresto, metoprolol, Lasix are currently on hold. Patient underwent DCCV 11/3 , failed to convert , remains in A. Fibrillation. 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  Midodrine was added for low BP, He seems to feel better on milrinone, Plan:Trail of home milrinone , that will be arranged.  Assessment & Plan:   Principal Problem:   Acute on chronic systolic CHF (congestive heart failure) (HCC) Active Problems:   HLD (hyperlipidemia)   History of stroke   Type 2 diabetes mellitus without complication, without long-term current use of insulin (HCC)   Persistent atrial fibrillation (HCC)   Cardiogenic shock (HCC)   Hyponatremia   Hypothermia   1. Acute on chronic systolic CHF-  Echo in 2021 showed EF 20 to 25%, repeat echo  done during this hospitalization showed EF of 15 to 20%.  BNP 1651.  Initially started on Lasix however became bradycardic and hypotensive so Lasix was held.  Likely cardiogenic shock.  Heart failure team consulted.  Patient started on milrinone and IV Lasix.  Patient underwent DCCV 11/3,  unable to convert,  remains in A. Fib. Lasix transitioned to torsemide. 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  He seems to feel better on milrinone, Plan: trial of home milrinone   2. Ascites- s/p paracentesis on 11/07/2019 with 4.8 liters fluid removal.  Likely from right heart failure.  3. Cardiogenic shock- Patient was started on IV Lasix however became bradycardic and hypotensive, Lasix was held and patient was transferred to [facility] for evaluation by heart failure team.  At this time he is on milrinone , Lasix switched to torsemide.  Heart failure team following. Midodrine was added for low BP, Plan: Trial of home milrinone  4. Hyponatremia- Improving , secondary likely hypervolemic hyponatremia from underlying CHF.  Sodium has improved today to 133.  He received 1 dose of tolvaptan on 11/9.  Continue diuresis with inotropic support as above.  We will follow serum sodium level in a.m.    5. Persistent atrial fibrillation-beta-blockers on hold due to hypotension.  Continue digoxin and Eliquis.  Amiodarone discontinued.  Patient underwent DCCV, unable to convert remains in A. fib.  6. Diabetes mellitus type 2- He was hypoglycemic on admission, continue hypoglycemic protocol.  Sliding scale insulin with NovoLog.  7. History of stroke-residual severe expressive aphasia, has right-sided paralysis. Remains at baseline.    DVT prophylaxis: Eliquis Code Status: Full Family Communication: No one at bed side. Disposition Plan:  Status is: Inpatient  Remains inpatient appropriate because:Inpatient level of care appropriate due to severity of  illness   Dispo: The patient is from: Home              Anticipated d/c is to: SNF              Anticipated d/c date is: 2 days              Patient currently is not medically stable to d/c.   Consultants:   Cardiology  Procedures:Cardioversion  Antimicrobials:  Anti-infectives (From admission, onward)   None      Subjective: Patient was seen and examined at bedside,  overnight events noted.  He denies any chest pain, shortness of breath, He is s/p DCCV remains in a/ fib unable to convert.  His BP been running low, started on midodrine.   Objective: Vitals:   11/13/19 0451 11/13/19 0813 11/13/19 1100 11/13/19 1500  BP: 90/88 (!) 94/55 94/70 91/72   Pulse:  (!) 50 (!) 57 (!) 59  Resp:  16 17 15   Temp: 98.7 F (37.1 C) 97.8 F (36.6 C) 97.8 F (36.6 C) 100 F (37.8 C)  TempSrc: Oral Oral Oral Oral  SpO2:  100% 100% 100%  Weight: 54.5 kg     Height:        Intake/Output Summary (Last 24 hours) at 11/13/2019 1612 Last data filed at 11/13/2019 1604 Gross per 24 hour  Intake 240 ml  Output 1500 ml  Net -1260 ml   Filed Weights   11/10/19 0617 11/11/19 0639 11/13/19 0451  Weight: 58.5 kg 55.6 kg 54.5 kg    Examination:  General exam: Appears calm and comfortable,  Anxious Respiratory system: Clear to auscultation. Respiratory effort normal. Cardiovascular system: S1 & S2 heard, irregular rhythm. No JVD, murmurs, rubs, gallops or clicks. No pedal edema. Gastrointestinal system: Abdomen is nondistended, soft and nontender. No organomegaly or masses felt.  Normal bowel sounds heard. Central nervous system: Alert and oriented.  Expressive aphasia, right sided weakness. Extremities: No edema, no cyanosis, no clubbing. Skin: No rashes, lesions or ulcers Psychiatry: Judgement and insight appear normal. Mood & affect appropriate.     Data Reviewed: I have personally reviewed following labs and imaging studies  CBC: Recent Labs  Lab 11/08/19 0553 11/08/19 0553  11/09/19 0608 11/09/19 0608 11/10/19 0450 11/11/19 0500 11/11/19 0808 11/12/19 0106 11/13/19 0046  WBC 5.5  --  5.8  --  5.9 6.5  --   --   --   NEUTROABS 4.2  --  4.3  --  4.4  --   --   --   --   HGB 11.3*   < > 10.9*   < > 11.1* 11.6* 13.3  13.6 12.4* 11.0*  HCT 33.1*   < > 32.0*   < > 33.1* 34.3* 39.0  40.0 36.7* 33.3*  MCV 93.0  --  93.0  --  93.2 92.0  --   --   --   PLT 200  --  186  --  200 203  --   --   --    < > = values in this interval not displayed.   Basic Metabolic Panel: Recent Labs  Lab 11/09/19 0608 11/09/19 0608 11/10/19 0450 11/11/19 0808 11/11/19 0815 11/12/19 0106 11/13/19 0046  NA 134*   < > 132* 134*  134* 132* 133* 134*  K 3.7   < > 3.8 3.8  3.8 3.8 3.9 3.9  CL  102  --  94*  --  95* 97* 100  CO2 26  --  26  --  27 25 23   GLUCOSE 113*  --  135*  --  113* 160* 180*  BUN 15  --  18  --  20 25* 30*  CREATININE 1.07  --  1.06  --  1.14 1.16 1.33*  CALCIUM 8.4*  --  8.6*  --  8.8* 9.2 8.6*  MG 1.8  --  2.1  --  1.9 2.4 2.1  PHOS  --   --   --   --  4.7* 3.8 3.6   < > = values in this interval not displayed.   GFR: Estimated Creatinine Clearance: 43.3 mL/min (A) (by C-G formula based on SCr of 1.33 mg/dL (H)). Liver Function Tests: Recent Labs  Lab 11/07/19 1547 11/13/19 0046  AST 39 89*  ALT 20 48*  ALKPHOS 163* 185*  BILITOT 2.0* 1.1  PROT 6.4* 6.8  ALBUMIN 3.2* 3.3*   No results for input(s): LIPASE, AMYLASE in the last 168 hours. No results for input(s): AMMONIA in the last 168 hours. Coagulation Profile: Recent Labs  Lab 11/10/19 0450  INR 1.6*   Cardiac Enzymes: No results for input(s): CKTOTAL, CKMB, CKMBINDEX, TROPONINI in the last 168 hours. BNP (last 3 results) No results for input(s): PROBNP in the last 8760 hours. HbA1C: No results for input(s): HGBA1C in the last 72 hours. CBG: Recent Labs  Lab 11/12/19 1139 11/12/19 1704 11/12/19 2105 11/13/19 0811 11/13/19 1209  GLUCAP 214* 111* 193* 109* 185*   Lipid  Profile: No results for input(s): CHOL, HDL, LDLCALC, TRIG, CHOLHDL, LDLDIRECT in the last 72 hours. Thyroid Function Tests: No results for input(s): TSH, T4TOTAL, FREET4, T3FREE, THYROIDAB in the last 72 hours. Anemia Panel: No results for input(s): VITAMINB12, FOLATE, FERRITIN, TIBC, IRON, RETICCTPCT in the last 72 hours. Sepsis Labs: No results for input(s): PROCALCITON, LATICACIDVEN in the last 168 hours.  Recent Results (from the past 240 hour(s))  Respiratory Panel by RT PCR (Flu A&B, Covid) - Nasopharyngeal Swab     Status: None   Collection Time: 11/04/19  6:21 PM   Specimen: Nasopharyngeal Swab  Result Value Ref Range Status   SARS Coronavirus 2 by RT PCR NEGATIVE NEGATIVE Final    Comment: (NOTE) SARS-CoV-2 target nucleic acids are NOT DETECTED.  The SARS-CoV-2 RNA is generally detectable in upper respiratoy specimens during the acute phase of infection. The lowest concentration of SARS-CoV-2 viral copies this assay can detect is 131 copies/mL. A negative result does not preclude SARS-Cov-2 infection and should not be used as the sole basis for treatment or other patient management decisions. A negative result may occur with  improper specimen collection/handling, submission of specimen other than nasopharyngeal swab, presence of viral mutation(s) within the areas targeted by this assay, and inadequate number of viral copies (<131 copies/mL). A negative result must be combined with clinical observations, patient history, and epidemiological information. The expected result is Negative.  Fact Sheet for Patients:  11/06/19  Fact Sheet for Healthcare Providers:  https://www.moore.com/  This test is no t yet approved or cleared by the https://www.young.biz/ FDA and  has been authorized for detection and/or diagnosis of SARS-CoV-2 by FDA under an Emergency Use Authorization (EUA). This EUA will remain  in effect (meaning this  test can be used) for the duration of the COVID-19 declaration under Section 564(b)(1) of the Act, 21 U.S.C. section 360bbb-3(b)(1), unless the authorization is terminated or  revoked sooner.     Influenza A by PCR NEGATIVE NEGATIVE Final   Influenza B by PCR NEGATIVE NEGATIVE Final    Comment: (NOTE) The Xpert Xpress SARS-CoV-2/FLU/RSV assay is intended as an aid in  the diagnosis of influenza from Nasopharyngeal swab specimens and  should not be used as a sole basis for treatment. Nasal washings and  aspirates are unacceptable for Xpert Xpress SARS-CoV-2/FLU/RSV  testing.  Fact Sheet for Patients: https://www.moore.com/  Fact Sheet for Healthcare Providers: https://www.young.biz/  This test is not yet approved or cleared by the [place] FDA and  has been authorized for detection and/or diagnosis of SARS-CoV-2 by  FDA under an Emergency Use Authorization (EUA). This EUA will remain  in effect (meaning this test can be used) for the duration of the  Covid-19 declaration under Section 564(b)(1) of the Act, 21  U.S.C. section 360bbb-3(b)(1), unless the authorization is  terminated or revoked. Performed at [facility], [address], [place], [place] [address]   Group A Strep by PCR     Status: None   Collection Time: 11/04/19  6:45 PM   Specimen: Throat; Sterile Swab  Result Value Ref Range Status   Group A Strep by PCR NOT DETECTED NOT DETECTED Final    Comment: Performed at [facility], [address], [place], [place] [address]     Radiology Studies: No results found.  Scheduled Meds: . apixaban  5 mg Oral BID  . chlorhexidine  15 mL Mouth Rinse BID  . Chlorhexidine Gluconate Cloth  6 each Topical Daily  . digoxin  0.125 mg Oral Daily  . insulin aspart  0-9 Units Subcutaneous TID WC  . lidocaine  1 application Urethral Once  . mouth rinse  15 mL Mouth Rinse q12n4p  . midodrine  2.5 mg  Oral TID WC  . simvastatin  40 mg Oral Daily  . sodium chloride flush  10-40 mL Intracatheter Q12H  . sodium chloride flush  3 mL Intravenous Q12H  . spironolactone  12.5 mg Oral Daily   Continuous Infusions: . milrinone 0.125 mcg/kg/min (11/12/19 0554)     LOS: 8 days    Time spent: 25 mins.    Cipriano Bunker, MD [facility]   If 7PM-7AM, please contact night-coverage

## 2019-11-13 NOTE — Progress Notes (Addendum)
  Patient having frequent pauses, the longest 2.14sec so far. Also had 5 beats of Vtach at 2349, pt. Resting in bed denies any acute distress, mag and K within normal level, Dr. Leafy Half notified.

## 2019-11-14 DIAGNOSIS — I5023 Acute on chronic systolic (congestive) heart failure: Secondary | ICD-10-CM | POA: Diagnosis not present

## 2019-11-14 DIAGNOSIS — Z515 Encounter for palliative care: Secondary | ICD-10-CM

## 2019-11-14 DIAGNOSIS — Z7189 Other specified counseling: Secondary | ICD-10-CM

## 2019-11-14 LAB — GLUCOSE, CAPILLARY
Glucose-Capillary: 139 mg/dL — ABNORMAL HIGH (ref 70–99)
Glucose-Capillary: 129 mg/dL — ABNORMAL HIGH (ref 70–99)
Glucose-Capillary: 197 mg/dL — ABNORMAL HIGH (ref 70–99)
Glucose-Capillary: 141 mg/dL — ABNORMAL HIGH (ref 70–99)

## 2019-11-14 LAB — COMPREHENSIVE METABOLIC PANEL
ALT: 61 U/L — ABNORMAL HIGH (ref 0–44)
AST: 111 U/L — ABNORMAL HIGH (ref 15–41)
Albumin: 3.2 g/dL — ABNORMAL LOW (ref 3.5–5.0)
Alkaline Phosphatase: 184 U/L — ABNORMAL HIGH (ref 38–126)
Anion gap: 8 (ref 5–15)
BUN: 30 mg/dL — ABNORMAL HIGH (ref 8–23)
CO2: 27 mmol/L (ref 22–32)
Calcium: 8.9 mg/dL (ref 8.9–10.3)
Chloride: 100 mmol/L (ref 98–111)
Creatinine, Ser: 1.1 mg/dL (ref 0.61–1.24)
GFR, Estimated: >60 mL/min (ref >60)
Glucose, Bld: 134 mg/dL — ABNORMAL HIGH (ref 70–99)
Potassium: 4 mmol/L (ref 3.5–5.1)
Sodium: 135 mmol/L (ref 135–145)
Total Bilirubin: 1.4 mg/dL — ABNORMAL HIGH (ref 0.3–1.2)
Total Protein: 6.7 g/dL (ref 6.5–8.1)

## 2019-11-14 LAB — COOXEMETRY PANEL
Carboxyhemoglobin: 1.3 % (ref 0.5–1.5)
Carboxyhemoglobin: 1 % (ref 0.5–1.5)
Methemoglobin: 0.8 % (ref 0.0–1.5)
Methemoglobin: 0.7 % (ref 0.0–1.5)
O2 Saturation: 66 %
O2 Saturation: 52 %
Total hemoglobin: 11 g/dL — ABNORMAL LOW (ref 12.0–16.0)
Total hemoglobin: 12 g/dL (ref 12.0–16.0)

## 2019-11-14 LAB — HEMOGLOBIN AND HEMATOCRIT, BLOOD
HCT: 32.4 % — ABNORMAL LOW (ref 39.0–52.0)
Hemoglobin: 10.8 g/dL — ABNORMAL LOW (ref 13.0–17.0)

## 2019-11-14 LAB — MAGNESIUM: Magnesium: 2.1 mg/dL (ref 1.7–2.4)

## 2019-11-14 LAB — PHOSPHORUS: Phosphorus: 3.4 mg/dL (ref 2.5–4.6)

## 2019-11-14 NOTE — Progress Notes (Signed)
Palliative:  HPI: 64 y.o. 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  He was found to be in an acute heart failure exacerbation.   Shortly after admission he underwent paracentesis with removal of 4.8L of fluid. On 11/3 DCCV was conducted and on 11/4 he had a RHC.  He was found to have low output right sided heart failure.  His LVEF is 15-20%. He was started on milrinone but was unable to tolerate it due to hypotension.  The milrinone was discontinued and dopamine was started.  Unfortunately on dopamine he had runs of VTach.  Milrinone has since been restarted and the patient appears comfortable.  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 Currently stable on low dose milrinone infusion.   I met today with [name] and no family present at bedside. [name] is sitting up in recliner (just got up with help of nurses). He is a man of few words but does answer my questions but takes some time to answer. He has no specific complaints. Doesn't sleep great in the hospital. He would like to return home if possible. I verified that we need to speak with niece, [name], to finalize plans and he confirms.   I called and spoke with niece, [name]. She has discussed with her uncles [name] (she calls him [name]) and her uncle [name]. She confirms that he desires DNR and also confirms desire for home with hospice care. We discussed the goal of hospice and that he would need ongoing support at home. She confirms understanding and that she is supportive of whatever he wants. They will arrange for someone to be with him 24/7. I recommend to have hospice provide hospital bed for them as this may be easier for him to get in and out of bed as he is a little weaker. Discussed that she will be hearing from transitions of care and hospice for further arrangements.   All  questions/concerns addressed. Emotional support provided.   Exam: Alert, oriented, delayed responses but appropriate responses. No distress. Breathing regular, unlabored. Abd distended but soft. Moves all extremities.   Plan: - Plan for home hospice with [facility] with milrinone infusion.   [place], NP Palliative Medicine Team Pager [phone] (Please see [email or web address] for schedule) Team Phone [phone]    Greater than 50%  of this time was spent counseling and coordinating care related to the above assessment and plan

## 2019-11-14 NOTE — TOC Progression Note (Signed)
Transition of Care [facility]) - Progression Note    Patient Details  Name: [name] MRN: [id number] Date of Birth: 05/01/1955  Transition of Care [facility]) CM/SW Contact  Terrial Rhodes, LCSWA Phone Number: 11/14/2019, 2:03 PM  Clinical Narrative:      CSW spoke with patients brother [name] by phone about discharge plans for patient. CSW updated on SNF placement. CSW let patients brother know that CSW is awaiting call back from [facility] to see if they can accept patient on Milrinone drip.CSW let [name] know that she saw where palliative saw patient and saw where family was thinking about home hospice vrs. Home health with palliative outpatient. [name] says family has been discussing plans. [name] says they are leaning towards home with hospice services. [name] wants palliative to follow up with [name] patients niece for final decision. CSW told patients brother she saw where palliative has tried to call [name]. Patients brother confirmed to tell palliative to continue to try and get in touch with her for final decision. CSW called palliative and let them know to continue to try and call [name] for final decision.Palliative confirmed they will call [name].CSW will continue to follow and assist with discharge planning needs.    CSW will continue to follow.    . Expected Discharge Plan: Skilled Nursing Facility Barriers to Discharge: Continued Medical Work up  Expected Discharge Plan and Services Expected Discharge Plan: Skilled Nursing Facility       Living arrangements for the past 2 months: Single Family Home                                       Social Determinants of Health (SDOH) Interventions    Readmission Risk Interventions No flowsheet data found.

## 2019-11-14 NOTE — Progress Notes (Signed)
Progress Note  Patient Name: [name] Date of Encounter: 11/14/2019  Primary Cardiologist: Verne Carrow, MD Advanced Heart Failure Provider: Marca Ancona, MD  Subjective   No breathlessness at rest, no chest pain.  Inpatient Medications    Scheduled Meds:  apixaban  5 mg Oral BID   chlorhexidine  15 mL Mouth Rinse BID   Chlorhexidine Gluconate Cloth  6 each Topical Daily   insulin aspart  0-9 Units Subcutaneous TID WC   lidocaine  1 application Urethral Once   mouth rinse  15 mL Mouth Rinse q12n4p   midodrine  2.5 mg Oral TID WC   simvastatin  40 mg Oral Daily   sodium chloride flush  10-40 mL Intracatheter Q12H   sodium chloride flush  3 mL Intravenous Q12H   spironolactone  12.5 mg Oral Daily   Continuous Infusions:  milrinone 0.125 mcg/kg/min (11/13/19 2200)   PRN Meds: acetaminophen **OR** acetaminophen, lidocaine, loratadine, ondansetron **OR** ondansetron (ZOFRAN) IV, oxyCODONE, polyethylene glycol, sodium chloride flush   Vital Signs    Vitals:   11/13/19 1724 11/13/19 2008 11/13/19 2300 11/14/19 0302  BP:  91/62 93/74 106/72  Pulse:    (!) 41  Resp:    19  Temp: 98.3 F (36.8 C) 98 F (36.7 C) 98.2 F (36.8 C) 98.2 F (36.8 C)  TempSrc: Oral Oral Oral Oral  SpO2:    100%  Weight:    63.2 kg  Height:        Intake/Output Summary (Last 24 hours) at 11/14/2019 0844 Last data filed at 11/14/2019 0700 Gross per 24 hour  Intake 1029.26 ml  Output 1650 ml  Net -620.74 ml   Filed Weights   11/11/19 0639 11/13/19 0451 11/14/19 0302  Weight: 55.6 kg 54.5 kg 63.2 kg    Telemetry    Atrial fibrillation, heart rate 40s to 50s, bursts of NSVT.  Personally reviewed.  ECG    An ECG dated 11/10/2019 was personally reviewed today and demonstrated:  Course atrial fibrillation with slow ventricular response.  Physical Exam   GEN: No acute distress.   Neck: No JVD. Cardiac:  Irregularly irregular, no gallop.  Respiratory:  Nonlabored.  Clear to auscultation bilaterally. GI: Soft, nontender, bowel sounds present. MS:  No pitting edema; No deformity. Neuro:   Expressive aphasia and right-sided weakness. Psych: Flat affect.  Labs    Chemistry Recent Labs  Lab 11/07/19 1547 11/07/19 2103 11/12/19 0106 11/13/19 0046 11/14/19 0535  NA  --    < > 133* 134* 135  K  --    < > 3.9 3.9 4.0  CL  --    < > 97* 100 100  CO2  --    < > 25 23 27   GLUCOSE  --    < > 160* 180* 134*  BUN  --    < > 25* 30* 30*  CREATININE  --    < > 1.16 1.33* 1.10  CALCIUM  --    < > 9.2 8.6* 8.9  PROT 6.4*  --   --  6.8 6.7  ALBUMIN 3.2*  --   --  3.3* 3.2*  AST 39  --   --  89* 111*  ALT 20  --   --  48* 61*  ALKPHOS 163*  --   --  185* 184*  BILITOT 2.0*  --   --  1.1 1.4*  GFRNONAA  --    < > >60 60* >60  ANIONGAP  --    < >  11 11 8    < > = values in this interval not displayed.     Hematology Recent Labs  Lab 11/09/19 [phone] 11/09/19 13/02/21 11/10/19 0450 11/10/19 0450 11/11/19 0500 11/11/19 0808 11/12/19 0106 11/13/19 0046 11/14/19 0535  WBC 5.8  --  5.9  --  6.5  --   --   --   --   RBC 3.44*  --  3.55*  --  3.73*  --   --   --   --   HGB 10.9*   < > 11.1*   < > 11.6*   < > 12.4* 11.0* 10.8*  HCT 32.0*   < > 33.1*   < > 34.3*   < > 36.7* 33.3* 32.4*  MCV 93.0  --  93.2  --  92.0  --   --   --   --   MCH 31.7  --  31.3  --  31.1  --   --   --   --   MCHC 34.1  --  33.5  --  33.8  --   --   --   --   RDW 15.5  --  15.2  --  14.7  --   --   --   --   PLT 186  --  200  --  203  --   --   --   --    < > = values in this interval not displayed.    Cardiac Enzymes Recent Labs  Lab 11/04/19 1818 11/04/19 2142  TROPONINIHS 26* 24*    Radiology    No results found.  Cardiac Studies   Echocardiogram 11/05/2019: 1. Left ventricular ejection fraction, by estimation, is 15-20%. The left  ventricle has severely decreased function. The left ventricle demonstrates  global hypokinesis. Left ventricular  diastolic function could not be  evaluated. Elevated left atrial  pressure. There is the interventricular septum is flattened in diastole  ('D' shaped left ventricle), consistent with right ventricular volume  overload.  2. Right ventricular systolic function is severely reduced. The right  ventricular size is severely enlarged. There is mildly elevated pulmonary  artery systolic pressure. The estimated right ventricular systolic  pressure is 39.8 mmHg.  3. Left atrial size was severely dilated.  4. Right atrial size was severely dilated.  5. The mitral valve is grossly normal. Mild mitral valve regurgitation.  No evidence of mitral stenosis.  6. The tricuspid valve is abnormal. Tricuspid valve regurgitation is  severe.  7. The aortic valve is tricuspid. Aortic valve regurgitation is not  visualized. No aortic stenosis is present.  8. The inferior vena cava is dilated in size with <50% respiratory  variability, suggesting right atrial pressure of 15 mmHg.   Right heart catheterization 11/11/2019: Hemodynamics (mmHg) RA mean 7 RV 29/6 PA 29/9, mean 17 PCWP mean 8  Oxygen saturations: PA 57% AO 100%  Cardiac Output (Thermo) 2.66 Cardiac Index (Thermo) 1.61  Cardiac Output (Fick) 3.38  Cardiac Index (Fick) 2.05  Patient Profile     64 y.o. 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  Assessment & Plan    1.  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  Plan for home milrinone, recent right heart catheterization noted above, he will transition  to SNF ultimately. Felt not to be a good candidate for LVAD for review Dr. Alford Highland note.  Co-ox up to 66 this morning, approximately 600  cc out more than in last 24 hours.  Creatinine down to 1.1.  2.  Persistent, coarse atrial fibrillation.  Continues on Eliquis for stroke prophylaxis.  Agree with discontinuation of digoxin given significant bradycardia.  3.  Transaminitis, AST 111 and ALT 61, likely related to hepatic congestion.  4.  Ascites status post paracentesis, noted in the setting of severe RV dysfunction.  Digoxin has been discontinued, holding Demadex today, can be reinitiated tomorrow most likely.  Otherwise continue milrinone 0.125 mcg/kg/min, Aldactone, Zocor, ProAmatine, and Eliquis.  Check BMET in a.m.  Signed, Nona Dell, MD  11/14/2019, 8:44 AM

## 2019-11-14 NOTE — Progress Notes (Addendum)
PROGRESS NOTE    [name]  JTT:[id number] DOB: 15-Dec-1955 DOA: 11/04/2019 PCP: Mirna Mires, MD     Brief Narrative:  64 year old male with a history of stroke with expressive aphasia, right-sided paralysis, nonischemic cardiomyopathy, systolic CHF EF 20 to 25% as of April 2021 echo, hypertension, diabetes mellitus type 2, persistent atrial flutter/atrial fibrillation came to ED with chief complaints of dyspnea.  Patient became progressively more dyspneic at home.  He had been ambulating with cane and started developing worsening distention/swelling in the abdomen.  His Lasix dosing was increased outpatient per his cardiologist with minimal improvement.  Due to progressively worsening dyspnea and abdominal swelling he was brought to the ED for further evaluation. He was found to have elevated BNP, indeterminate troponins.  Initially started on Lasix for diuresis and abdominal ascites. He became more hypotensive and bradycardic requiring discontinuation of Lasix.  Cardiology was consulted for concern of development cardiogenic shock.  Cardiology recommended transfer to [facility] for evaluation by Heart failure team. Entresto, metoprolol, Lasix are currently on hold. Patient underwent DCCV 11/3 , failed to convert , remains in A. Fibrillation. 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  Midodrine was added for low BP, He seems to feel better on milrinone, Plan:Trail of home milrinone , that will be arranged.  Assessment & Plan:   Principal Problem:   Acute on chronic systolic CHF (congestive heart failure) (HCC) Active Problems:   HLD (hyperlipidemia)   History of stroke   Type 2 diabetes mellitus without complication, without long-term current use of insulin (HCC)   Persistent atrial fibrillation (HCC)   Cardiogenic shock (HCC)   Hyponatremia   Hypothermia   1. Acute on chronic systolic CHF-  Echo in 2021 showed EF 20 to 25%, repeat echo  done during this hospitalization showed EF of 15 to 20%.  BNP 1651.  Initially started on Lasix however became bradycardic and hypotensive so Lasix was held.  Likely cardiogenic shock.  Heart failure team consulted.  Patient started on milrinone and IV Lasix.  Patient underwent DCCV 11/3,  unable to convert,  remains in A. Fib. Lasix transitioned to torsemide. 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  He seems to feel better on milrinone, Plan: trial of home milrinone   2. Ascites- s/p paracentesis on 11/07/2019 with 4.8 liters fluid removal.  Likely from right heart failure.  3. Cardiogenic shock- Patient was started on IV Lasix however became bradycardic and hypotensive, Lasix was held and patient was transferred to [facility] for evaluation by heart failure team.  At this time he is on milrinone , Lasix switched to torsemide.  Heart failure team following. Midodrine was added for low BP, Plan: Trial of home milrinone  4. Hyponatremia- Resolved, secondary likely hypervolemic hyponatremia from underlying CHF.  Sodium has improved today to 135.  He received 1 dose of tolvaptan on 11/9.  Continue diuresis with inotropic support as above.  We will follow serum sodium level in a.m.    5. Persistent atrial fibrillation-beta-blockers on hold due to hypotension.  Continue digoxin and Eliquis.  Amiodarone discontinued.  Patient underwent DCCV, unable to convert remains in A. fib.  6. Diabetes mellitus type 2- He was hypoglycemic on admission, continue hypoglycemic protocol.  Sliding scale insulin with NovoLog.  7. History of stroke-residual severe expressive aphasia, has right-sided paralysis. Remains at baseline.    DVT prophylaxis: Eliquis Code Status: Full Family Communication: No one at bed side. Disposition Plan:    Status  is: Inpatient  Remains inpatient appropriate because:Inpatient level of care appropriate due to severity of illness   Dispo:  The patient is from: Home              Anticipated d/c is to: SNF              Anticipated d/c date is: 2 days              Patient currently is not medically stable to d/c.   Consultants:   Cardiology  Procedures:Cardioversion  Antimicrobials:  Anti-infectives (From admission, onward)   None      Subjective: Patient was seen and examined at bedside,  overnight events noted.  He denies any chest pain, shortness of breath, He is s/p DCCV remains in a/ fib unable to convert.  Digoxin was discontinued due to bradycardia.    Objective: Vitals:   11/14/19 0302 11/14/19 0754 11/14/19 1055 11/14/19 1059  BP: 106/72  (!) 89/67 97/60  Pulse: (!) 41 (!) 55 (!) 47 (!) 40  Resp: 19  17 (!) 25  Temp: 98.2 F (36.8 C) 98.8 F (37.1 C) 98.1 F (36.7 C)   TempSrc: Oral Oral Oral   SpO2: 100%  98% 100%  Weight: 63.2 kg     Height:        Intake/Output Summary (Last 24 hours) at 11/14/2019 1456 Last data filed at 11/14/2019 0820 Gross per 24 hour  Intake 1332.04 ml  Output 1650 ml  Net -317.96 ml   Filed Weights   11/11/19 0639 11/13/19 0451 11/14/19 0302  Weight: 55.6 kg 54.5 kg 63.2 kg    Examination:  General exam: Appears calm and comfortable,  Anxious Respiratory system: Clear to auscultation. Respiratory effort normal. Cardiovascular system: S1 & S2 heard, irregular rhythm. No JVD, murmurs, rubs, gallops or clicks. No pedal edema. Gastrointestinal system: Abdomen is nondistended, soft and nontender. No organomegaly or masses felt.  Normal bowel sounds heard. Central nervous system: Alert and oriented.  Expressive aphasia, right sided weakness. Extremities: No edema, no cyanosis, no clubbing. Skin: No rashes, lesions or ulcers Psychiatry: Judgement and insight appear normal. Mood & affect appropriate.     Data Reviewed: I have personally reviewed following labs and imaging studies  CBC: Recent Labs  Lab 11/08/19 0553 11/08/19 0553 11/09/19 0608 11/09/19 0608  11/10/19 0450 11/10/19 0450 11/11/19 0500 11/11/19 0808 11/12/19 0106 11/13/19 0046 11/14/19 0535  WBC 5.5  --  5.8  --  5.9  --  6.5  --   --   --   --   NEUTROABS 4.2  --  4.3  --  4.4  --   --   --   --   --   --   HGB 11.3*   < > 10.9*   < > 11.1*   < > 11.6* 13.3  13.6 12.4* 11.0* 10.8*  HCT 33.1*   < > 32.0*   < > 33.1*   < > 34.3* 39.0  40.0 36.7* 33.3* 32.4*  MCV 93.0  --  93.0  --  93.2  --  92.0  --   --   --   --   PLT 200  --  186  --  200  --  203  --   --   --   --    < > = values in this interval not displayed.   Basic Metabolic Panel: Recent Labs  Lab 11/10/19 0450 11/10/19 0450 11/11/19 0808 11/11/19 0815 11/12/19  0106 11/13/19 0046 11/14/19 0535  NA 132*   < > 134*  134* 132* 133* 134* 135  K 3.8   < > 3.8  3.8 3.8 3.9 3.9 4.0  CL 94*  --   --  95* 97* 100 100  CO2 26  --   --  27 25 23 27   GLUCOSE 135*  --   --  113* 160* 180* 134*  BUN 18  --   --  20 25* 30* 30*  CREATININE 1.06  --   --  1.14 1.16 1.33* 1.10  CALCIUM 8.6*  --   --  8.8* 9.2 8.6* 8.9  MG 2.1  --   --  1.9 2.4 2.1 2.1  PHOS  --   --   --  4.7* 3.8 3.6 3.4   < > = values in this interval not displayed.   GFR: Estimated Creatinine Clearance: 60.6 mL/min (by C-G formula based on SCr of 1.1 mg/dL). Liver Function Tests: Recent Labs  Lab 11/13/19 0046 11/14/19 0535  AST 89* 111*  ALT 48* 61*  ALKPHOS 185* 184*  BILITOT 1.1 1.4*  PROT 6.8 6.7  ALBUMIN 3.3* 3.2*   No results for input(s): LIPASE, AMYLASE in the last 168 hours. No results for input(s): AMMONIA in the last 168 hours. Coagulation Profile: Recent Labs  Lab 11/10/19 0450  INR 1.6*   Cardiac Enzymes: No results for input(s): CKTOTAL, CKMB, CKMBINDEX, TROPONINI in the last 168 hours. BNP (last 3 results) No results for input(s): PROBNP in the last 8760 hours. HbA1C: No results for input(s): HGBA1C in the last 72 hours. CBG: Recent Labs  Lab 11/13/19 1209 11/13/19 1609 11/13/19 2104 11/14/19 0757  11/14/19 1120  GLUCAP 185* 153* 158* 139* 129*   Lipid Profile: No results for input(s): CHOL, HDL, LDLCALC, TRIG, CHOLHDL, LDLDIRECT in the last 72 hours. Thyroid Function Tests: No results for input(s): TSH, T4TOTAL, FREET4, T3FREE, THYROIDAB in the last 72 hours. Anemia Panel: No results for input(s): VITAMINB12, FOLATE, FERRITIN, TIBC, IRON, RETICCTPCT in the last 72 hours. Sepsis Labs: No results for input(s): PROCALCITON, LATICACIDVEN in the last 168 hours.  Recent Results (from the past 240 hour(s))  Respiratory Panel by RT PCR (Flu A&B, Covid) - Nasopharyngeal Swab     Status: None   Collection Time: 11/04/19  6:21 PM   Specimen: Nasopharyngeal Swab  Result Value Ref Range Status   SARS Coronavirus 2 by RT PCR NEGATIVE NEGATIVE Final    Comment: (NOTE) SARS-CoV-2 target nucleic acids are NOT DETECTED.  The SARS-CoV-2 RNA is generally detectable in upper respiratoy specimens during the acute phase of infection. The lowest concentration of SARS-CoV-2 viral copies this assay can detect is 131 copies/mL. A negative result does not preclude SARS-Cov-2 infection and should not be used as the sole basis for treatment or other patient management decisions. A negative result may occur with  improper specimen collection/handling, submission of specimen other than nasopharyngeal swab, presence of viral mutation(s) within the areas targeted by this assay, and inadequate number of viral copies (<131 copies/mL). A negative result must be combined with clinical observations, patient history, and epidemiological information. The expected result is Negative.  Fact Sheet for Patients:  11/06/19  Fact Sheet for Healthcare Providers:  https://www.moore.com/  This test is no t yet approved or cleared by the https://www.young.biz/ FDA and  has been authorized for detection and/or diagnosis of SARS-CoV-2 by FDA under an Emergency Use  Authorization (EUA). This EUA will remain  in effect (meaning this test can be used) for the duration of the COVID-19 declaration under Section 564(b)(1) of the Act, 21 U.S.C. section 360bbb-3(b)(1), unless the authorization is terminated or revoked sooner.     Influenza A by PCR NEGATIVE NEGATIVE Final   Influenza B by PCR NEGATIVE NEGATIVE Final    Comment: (NOTE) The Xpert Xpress SARS-CoV-2/FLU/RSV assay is intended as an aid in  the diagnosis of influenza from Nasopharyngeal swab specimens and  should not be used as a sole basis for treatment. Nasal washings and  aspirates are unacceptable for Xpert Xpress SARS-CoV-2/FLU/RSV  testing.  Fact Sheet for Patients: https://www.moore.com/  Fact Sheet for Healthcare Providers: https://www.young.biz/  This test is not yet approved or cleared by the [place] FDA and  has been authorized for detection and/or diagnosis of SARS-CoV-2 by  FDA under an Emergency Use Authorization (EUA). This EUA will remain  in effect (meaning this test can be used) for the duration of the  Covid-19 declaration under Section 564(b)(1) of the Act, 21  U.S.C. section 360bbb-3(b)(1), unless the authorization is  terminated or revoked. Performed at [facility], [address], [place], [place] [address]   Group A Strep by PCR     Status: None   Collection Time: 11/04/19  6:45 PM   Specimen: Throat; Sterile Swab  Result Value Ref Range Status   Group A Strep by PCR NOT DETECTED NOT DETECTED Final    Comment: Performed at [facility], [address], [place], [place] [address]     Radiology Studies: No results found.  Scheduled Meds: . apixaban  5 mg Oral BID  . chlorhexidine  15 mL Mouth Rinse BID  . Chlorhexidine Gluconate Cloth  6 each Topical Daily  . insulin aspart  0-9 Units Subcutaneous TID WC  . lidocaine  1 application Urethral Once  . mouth rinse  15 mL Mouth  Rinse q12n4p  . midodrine  2.5 mg Oral TID WC  . simvastatin  40 mg Oral Daily  . sodium chloride flush  10-40 mL Intracatheter Q12H  . sodium chloride flush  3 mL Intravenous Q12H  . spironolactone  12.5 mg Oral Daily   Continuous Infusions: . milrinone 0.125 mcg/kg/min (11/13/19 2200)     LOS: 9 days    Time spent: 25 mins.    Cipriano Bunker, MD [facility]   If 7PM-7AM, please contact night-coverage

## 2019-11-14 NOTE — Progress Notes (Signed)
Patient's heart rate intermittently decreased to 27-29 nonsustained.  BP currently 106/72 MAP 82, extremities warm, pt asymptomatic.  Noted digoxin dose 0.125 QD ordered.  Dr. Hyacinth Meeker with cardiology notified and orders to hold am digoxin received.  Will continue to monitor patient closely.

## 2019-11-15 DIAGNOSIS — I5023 Acute on chronic systolic (congestive) heart failure: Secondary | ICD-10-CM | POA: Diagnosis not present

## 2019-11-15 LAB — GLUCOSE, CAPILLARY
Glucose-Capillary: 101 mg/dL — ABNORMAL HIGH (ref 70–99)
Glucose-Capillary: 280 mg/dL — ABNORMAL HIGH (ref 70–99)
Glucose-Capillary: 87 mg/dL (ref 70–99)
Glucose-Capillary: 109 mg/dL — ABNORMAL HIGH (ref 70–99)
Glucose-Capillary: 97 mg/dL (ref 70–99)

## 2019-11-15 LAB — COMPREHENSIVE METABOLIC PANEL
ALT: 61 U/L — ABNORMAL HIGH (ref 0–44)
AST: 100 U/L — ABNORMAL HIGH (ref 15–41)
Albumin: 3.3 g/dL — ABNORMAL LOW (ref 3.5–5.0)
Alkaline Phosphatase: 191 U/L — ABNORMAL HIGH (ref 38–126)
Anion gap: 11 (ref 5–15)
BUN: 27 mg/dL — ABNORMAL HIGH (ref 8–23)
CO2: 25 mmol/L (ref 22–32)
Calcium: 9 mg/dL (ref 8.9–10.3)
Chloride: 99 mmol/L (ref 98–111)
Creatinine, Ser: 1.09 mg/dL (ref 0.61–1.24)
GFR, Estimated: >60 mL/min (ref >60)
Glucose, Bld: 130 mg/dL — ABNORMAL HIGH (ref 70–99)
Potassium: 4.4 mmol/L (ref 3.5–5.1)
Sodium: 135 mmol/L (ref 135–145)
Total Bilirubin: 1.3 mg/dL — ABNORMAL HIGH (ref 0.3–1.2)
Total Protein: 7 g/dL (ref 6.5–8.1)

## 2019-11-15 LAB — COOXEMETRY PANEL
Carboxyhemoglobin: 1.2 % (ref 0.5–1.5)
Methemoglobin: 0.7 % (ref 0.0–1.5)
O2 Saturation: 67.8 %
Total hemoglobin: 11.4 g/dL — ABNORMAL LOW (ref 12.0–16.0)

## 2019-11-15 LAB — PHOSPHORUS: Phosphorus: 3.5 mg/dL (ref 2.5–4.6)

## 2019-11-15 LAB — HEMOGLOBIN AND HEMATOCRIT, BLOOD
HCT: 33.4 % — ABNORMAL LOW (ref 39.0–52.0)
Hemoglobin: 11 g/dL — ABNORMAL LOW (ref 13.0–17.0)

## 2019-11-15 LAB — MAGNESIUM: Magnesium: 2.4 mg/dL (ref 1.7–2.4)

## 2019-11-15 MED ORDER — TORSEMIDE 20 MG PO TABS
20.0000 mg | ORAL_TABLET | Freq: Every day | ORAL | Status: DC
  Administered 2019-11-15: 20 mg via ORAL
  Filled 2019-11-15: qty 1

## 2019-11-15 NOTE — Progress Notes (Addendum)
Patient ID: [name], male   DOB: [birth year], 64 y.o.   MRN: [id number]     Advanced Heart Failure Rounding Note  PCP-Cardiologist: Verne Carrow, MD   Subjective:    Remains on milrinone 0.125 mcg. Yesterday dig stopped due to bradycardia. CO-OX stable 68%.   Denies SOB.   Paracentesis (10/31) with 4.8 L out. O2 sats 100% on RA.   Cardiac Studies  RHC (11/4): RHC Procedural Findings: Hemodynamics (mmHg) RA mean 7 RV 29/6 PA 29/9, mean 17 PCWP mean 8 Oxygen saturations: PA 57% AO 100% Cardiac Output (Thermo) 2.66 Cardiac Index (Thermo) 1.61 Cardiac Output (Fick) 3.38  Cardiac Index (Fick) 2.05  Cardiac MRI: 1. Normal LV size with normal wall thickness. EF 24%, diffuse hypokinesis worse in the septum. 2. Mildly dilated RV with mildly decreased systolic function, EF 42%. 3.  Severe biatrial enlargement. 4. Extensive LGE in a non-coronary distribution throughout much of the LV, also involving RV free wall and left and right atrial walls. ECV 42%. This is suggestive of infiltrative disease. The walls are not thick, making amyloidosis and Fabry's less likely. Myocarditis is a consideration.  PYP scan: Grade 2, H/CL 1.64.  Suggestive of transthyretin amyloidosis.    Objective:   Weight Range: 61.1 kg Body mass index is 21.1 kg/m.   Vital Signs:   Temp:  [97.5 F (36.4 C)-98.8 F (37.1 C)] 97.5 F (36.4 C) (11/08 0402) Pulse Rate:  [40-55] 48 (11/07 1800) Resp:  [17-25] 17 (11/07 1800) BP: (89-107)/(60-76) 104/76 (11/08 0001) SpO2:  [98 %-100 %] 100 % (11/07 1800) Weight:  [61.1 kg] 61.1 kg (11/08 0402) Last BM Date: 11/13/19  Weight change: Filed Weights   11/13/19 0451 11/14/19 0302 11/15/19 0402  Weight: 54.5 kg 63.2 kg 61.1 kg    Intake/Output:   Intake/Output Summary (Last 24 hours) at 11/15/2019 0722 Last data filed at 11/15/2019 0452 Gross per 24 hour  Intake 441.45 ml  Output 1050 ml  Net -608.55 ml      Physical Exam   CVP  10-11  General:  In bed  No resp difficulty HEENT: normal Neck: supple. JVP 10-11D. Carotids 2+ bilat; no bruits. No lymphadenopathy or thryomegaly appreciated. Cor: PMI nondisplaced. Irregular rate & rhythm. No rubs, gallops or murmurs. Lungs: clear Abdomen: soft, nontender, nondistended. No hepatosplenomegaly. No bruits or masses. Good bowel sounds. Extremities: no cyanosis, clubbing, rash, edema. LUE PICC  Neuro: alert & orientedx3, cranial nerves grossly intact. moves all 4 extremities w/o difficulty. Affect pleasant   Telemetry   A fib 40-60s   Labs    CBC Recent Labs    11/14/19 0535 11/15/19 0441  HGB 10.8* 11.0*  HCT 32.4* 33.4*   Basic Metabolic Panel Recent Labs    12/87/86 0535 11/15/19 0441  NA 135 135  K 4.0 4.4  CL 100 99  CO2 27 25  GLUCOSE 134* 130*  BUN 30* 27*  CREATININE 1.10 1.09  CALCIUM 8.9 9.0  MG 2.1 2.4  PHOS 3.4 3.5   Liver Function Tests Recent Labs    11/14/19 0535 11/15/19 0441  AST 111* 100*  ALT 61* 61*  ALKPHOS 184* 191*  BILITOT 1.4* 1.3*  PROT 6.7 7.0  ALBUMIN 3.2* 3.3*   No results for input(s): LIPASE, AMYLASE in the last 72 hours. Cardiac Enzymes No results for input(s): CKTOTAL, CKMB, CKMBINDEX, TROPONINI in the last 72 hours.  BNP: BNP (last 3 results) Recent Labs    07/12/19 1913 11/04/19 1818  BNP 1,991.9* 1,651.0*  ProBNP (last 3 results) No results for input(s): PROBNP in the last 8760 hours.   D-Dimer No results for input(s): DDIMER in the last 72 hours. Hemoglobin A1C No results for input(s): HGBA1C in the last 72 hours. Fasting Lipid Panel No results for input(s): CHOL, HDL, LDLCALC, TRIG, CHOLHDL, LDLDIRECT in the last 72 hours. Thyroid Function Tests No results for input(s): TSH, T4TOTAL, T3FREE, THYROIDAB in the last 72 hours.  Invalid input(s): FREET3  Other results:   Imaging    No results found.   Medications:     Scheduled Medications: . apixaban  5 mg Oral BID  .  chlorhexidine  15 mL Mouth Rinse BID  . Chlorhexidine Gluconate Cloth  6 each Topical Daily  . insulin aspart  0-9 Units Subcutaneous TID WC  . lidocaine  1 application Urethral Once  . mouth rinse  15 mL Mouth Rinse q12n4p  . midodrine  2.5 mg Oral TID WC  . simvastatin  40 mg Oral Daily  . sodium chloride flush  10-40 mL Intracatheter Q12H  . sodium chloride flush  3 mL Intravenous Q12H  . spironolactone  12.5 mg Oral Daily    Infusions: . milrinone 0.125 mcg/kg/min (11/13/19 2200)    PRN Medications: acetaminophen **OR** acetaminophen, lidocaine, loratadine, ondansetron **OR** ondansetron (ZOFRAN) IV, oxyCODONE, polyethylene glycol, sodium chloride flush   Assessment/Plan   1. Acute on chronic systolic CHF: Nonischemic cardiomyopathy known since 2018.  Improved initially in 2018 with DCCV, but has been back in atrial fibrillation since 2019 it appears and EF has been low.  I reviewed echo this admission, EF 15-20% with mod-severe RV dilation and moderate RV dysfunction, D-shaped septum, dilated IVC, severe biatrial enlargement. Significant RV failure with ascites on CT abdomen, had paracentesis.  Cardiac MRI showed LV EF 24%, RV mildly dilated with EF 42%, extensive LGE in a non-coronary distribution throughout much of the LV, also involving RV free wall and left and right atrial walls, ECV 42%. This is suggestive of infiltrative disease. 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  He was initially on milrinone for low output HF, this was weaned off for DCCV which he failed. RHC 11/4 off milrinone showed optimized filling pressures but low cardiac output, milrinone restarted at 0.125.  - Remains on milrinone 0.125 mcg. CO-OX 68%.     - Over the weekend dig stopped due to bradycardia.  - Continue spironolactone 12.5 daily  -  BP too soft for ARB/ARNi - Volume status trending up. Restart torsemide 20 mg daily.    - Continue midodrine 2.5 mg tid for BP support  - Narrow QRS, not CRT candidate.  - Not  a good candidate for VAD.  I had patient assessed by our LVAD nurse, she had significant concerns about his right arm/hand weakness.  He would be unable to manage his LVAD equipment and would need consistent 24 hr caregiver (not available).  I have discussed this with his family.  - He will need home milrinone to bridge to VAD  2. Atrial fibrillation: Now chronic.  Initially in 2018, EF improved after DCCV.  However, as above, he is not in RVR so do not think that cardiomyopathy is primarily due to AF.  He failed DCCV on amiodarone this admission, amiodarone due stopped due to bradycardia.  - Continue Eliquis  3. Ascites: Large ascites on CT.  Suspect due to RV failure. Paracentesis on 10/31 with 4.8 L out. O2 sats stable on RA  4. H/o CVA: In 2010, with expressive aphasia and right-sided weakness. Per niece, he is at baseline.  - PT/OT.  5. Hyponatremia: Rresolved with 1 dose tolvaptan. Na 135 today.   Plan to d/c home with [facility].   Home meds for d/c Milrinone 0.125 mcg. Torsemide 20 mg daily  Spironolactone 12.5 mg daily  Midodrine 2.5 mg three times a day  Eliquis 5 mg twice a day  Simvastatin 40 mg daily   Length of Stay: 10  Tonye Becket, NP  11/15/2019, 7:22 AM  Advanced Heart Failure Team Pager [phone] (M-F; 7a - 4p)  Please contact [facility] for night-coverage after hours (4p -7a ) and weekends on [email or web address]  Patient seen with NP, agree with the above note.   He is stable today.  CVP 10-11, co-ox 68%.  Creatinine stable 1.09.    General: NAD Neck: JVP 10 cm, no thyromegaly or thyroid nodule.  Lungs: Clear to auscultation bilaterally with normal respiratory effort. CV: Lateral PMI.  Heart irregular S1/S2, no S3/S4, no murmur.  No peripheral edema.   Abdomen: Soft, nontender, no  hepatosplenomegaly, no distention.  Skin: Intact without lesions or rashes.  Neurologic: Alert and oriented x 3.  Psych: Normal affect. Extremities: No clubbing or cyanosis.  HEENT: Normal.   End stage cardiomyopathy, now on milrinone 0.125 with plan to continue this at home. 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  We evaluated him for LVAD, but with limited ability to use his right hand would not be able to manage equipment effectively, do not think that he would be a good candidate.  - Would resume torsemide 20 mg daily today, CVP back up to 10.  - OK to leave off digoxin due to bradycardia since he will be going home on milrinone.  - BP stable on current midodrine.  - He can go home today with home hospice ([facility]) and the medications listed above, will have followup in CHF clinic.   Marca Ancona 11/15/2019 7:49 AM

## 2019-11-15 NOTE — TOC Progression Note (Signed)
Transition of Care [facility]) - Progression Note    Patient Details  Name: [name] MRN: [id number] Date of Birth: 08/22/1955  Transition of Care [facility] CM/SW Contact  Graves-Bigelow, Lamar Laundry, RN Phone Number: 11/15/2019, 1:14 PM  Clinical Narrative:  Case Manager spoke with patient and at the time of the visit- Brother [name] came in the room. [name] suggested that the Case Manager call Marcelino Duster the niece of the patient. Marcelino Duster and Case Manager discussed Hospice in the home- family is agreeable to [facility] with IV Milrinone Services. Brother states patient will have family support. Durable Medical Equipment discussed. Family is unsure at this point if the patient will need a hospital bed- ensured the family that [facility] will discuss equipment needs as time progresses. Physical Therapy recommendations were for wheelchair and bedside commode. [facility] Liaison will discuss with the family. IV Milrinone will be received from [facility]- contacted Pam with [facility] and she is assisting with securing the Milrinone. Family will be able to receive the patient on 11-16-19 in the home- MD is aware. Patient can be transported via private vehicle. Case Manager will continue to follow for additional transition of care needs.   Expected Discharge Plan: Home w Hospice Care Barriers to Discharge: No Barriers Identified  Expected Discharge Plan and Services Expected Discharge Plan: Home w Hospice Care In-house Referral: NA Discharge Planning Services: CM Consult Post Acute Care Choice: Hospice Living arrangements for the past 2 months: Single Family Home                 DME Arranged:  (hospice agency to discuss with family- recommendations suggested were Bedside commode and Wheelchair.) DME Agency: NA       HH Arranged: RN HH Agency: [employer] (Hospice Services) Date [facility] Agency Contacted: 11/15/19 Time HH Agency Contacted: 1300 Representative spoke with at [facility] Agency:  Tim   Readmission Risk Interventions No flowsheet data found.

## 2019-11-15 NOTE — Progress Notes (Signed)
Orders placed for home milrinone 0.125 mcg/kg/min.   HF Home Milrinone completed.   Discussed with Jeri Modena with [facility].  [facility] will set up to provide home milrinone. Anticipate d/c tomorrow.   [facility] will follow at discharge.   Devondre Guzzetta NP-C  12:56 PM

## 2019-11-15 NOTE — Progress Notes (Signed)
Dr. Lucianne Muss updated with pt complaint of excruciating pain on his R leg. Observed rubbing it and was moaning  stating " it hurts a lot " . Pt apparently teary eyed. MD also updated pt had oxycodone at 1806 hrs  But has continued to c/o of pain.

## 2019-11-15 NOTE — Progress Notes (Signed)
Physical Therapy Treatment Patient Details Name: [name] MRN: [id number] DOB: 04/26/1955 Today's Date: 11/15/2019    History of Present Illness 64 yo male with onset of acute CHF was admitted with SOB and inability to move without SOB.  Pt received a paracentesis, removing 4.8L O2.  Pt is referred to PT for mobility assessment.  PMHx:  EF 20-25%, a-flutter, CHF, DM, HTN, NICM, stroke with R hemiparesis    PT Comments    Pt has been evaluated and followed by PT x 1 week. Pt continues to require physical assist for all OOB mobility. Appears family is considering having patient come home with hospice services. Pt will need assistance for mobility at home and will recommend wheelchair since his amb is very minimal at this point.   Follow Up Recommendations  SNF;Supervision/Assistance - 24 hour (may go home with family and hospice)     Equipment Recommendations  Wheelchair (measurements PT);Wheelchair cushion (measurements PT)    Recommendations for Other Services       Precautions / Restrictions Precautions Precautions: Fall;Other (comment) Precaution Comments: R sided weakness from previous CVA    Mobility  Bed Mobility Overal bed mobility: Needs Assistance Bed Mobility: Supine to Sit     Supine to sit: Supervision;HOB elevated     General bed mobility comments: Use of rail and incr time  Transfers Overall transfer level: Needs assistance Equipment used: 1 person hand held assist (Wall rail) Transfers: Sit to/from [employer] Transfers Sit to Stand: Min assist Stand pivot transfers: Mod assist       General transfer comment: Assist to bring hips up. Mod assist for pivotal steps to chair  Ambulation/Gait Ambulation/Gait assistance: Min assist Gait Distance (Feet): 12 Feet (x 4) Assistive device:  (wall rail) Gait Pattern/deviations: Step-through pattern;Decreased step length - right;Decreased step length - left;Trunk flexed;Decreased dorsiflexion -  right Gait velocity: decr Gait velocity interpretation: <1.8 ft/sec, indicate of risk for recurrent falls General Gait Details: Pt using trunk/hip momentum to advance RLE. Assist for balance and support. Wall rail provided pt strong support that made gait easier than using a cane would.   Stairs             Wheelchair Mobility    Modified Rankin (Stroke Patients Only)       Balance Overall balance assessment: Needs assistance Sitting-balance support: Feet supported;Bilateral upper extremity supported Sitting balance-Leahy Scale: Fair     Standing balance support: During functional activity;Single extremity supported Standing balance-Leahy Scale: Poor Standing balance comment: UE support and min guard assist for static standing                            Cognition Arousal/Alertness: Awake/alert Behavior During Therapy: WFL for tasks assessed/performed Overall Cognitive Status: Difficult to assess                                 General Comments: Pt following 1 step commands.      Exercises      General Comments        Pertinent Vitals/Pain      Home Living                      Prior Function            PT Goals (current goals can now be found in the care plan section) Progress towards PT goals:  Progressing toward goals    Frequency    Min 3X/week      PT Plan Current plan remains appropriate;Frequency needs to be updated    Co-evaluation              AM-PAC PT "6 Clicks" Mobility   Outcome Measure  Help needed turning from your back to your side while in a flat bed without using bedrails?: A Little Help needed moving from lying on your back to sitting on the side of a flat bed without using bedrails?: A Little Help needed moving to and from a bed to a chair (including a wheelchair)?: A Little Help needed standing up from a chair using your arms (e.g., wheelchair or bedside chair)?: A Lot Help needed  to walk in hospital room?: A Lot Help needed climbing 3-5 steps with a railing? : Total 6 Click Score: 14    End of Session Equipment Utilized During Treatment: Gait belt Activity Tolerance: Patient tolerated treatment well Patient left: in chair;with call bell/phone within reach;with chair alarm set Nurse Communication: Mobility status PT Visit Diagnosis: Unsteadiness on feet (R26.81);Muscle weakness (generalized) (M62.81);Hemiplegia and hemiparesis Hemiplegia - Right/Left: Right Hemiplegia - dominant/non-dominant: Dominant Hemiplegia - caused by: Cerebral infarction     Time: 2395-3202 PT Time Calculation (min) (ACUTE ONLY): 24 min  Charges:  $Gait Training: 8-22 mins $Therapeutic Activity: 8-22 mins                     [facility] PT Acute Rehabilitation Services Pager [phone] Office [phone]    [name] [facility] 11/15/2019, 12:28 PM

## 2019-11-15 NOTE — Progress Notes (Addendum)
PROGRESS NOTE    [name]  LPF:[id number] DOB: [birth year] DOA: 11/04/2019 PCP: Mirna Mires, MD     Brief Narrative:  64 year old male with a history of stroke with expressive aphasia, right-sided paralysis, nonischemic cardiomyopathy, systolic CHF EF 20 to 25% as of April 2021 echo, hypertension, diabetes mellitus type 2, persistent atrial flutter/atrial fibrillation came to ED with chief complaints of dyspnea.  Patient became progressively more dyspneic at home.  He had been ambulating with cane and started developing worsening distention/swelling in the abdomen.  His Lasix dosing was increased outpatient per his cardiologist with minimal improvement.  Due to progressively worsening dyspnea and abdominal swelling he was brought to the ED for further evaluation. He was found to have elevated BNP, indeterminate troponins.  Initially started on Lasix for diuresis and abdominal ascites. He became more hypotensive and bradycardic requiring discontinuation of Lasix.  Cardiology was consulted for concern of development cardiogenic shock.  Cardiology recommended transfer to [facility] for evaluation by Heart failure team. Entresto, metoprolol, Lasix are currently on hold. Patient underwent DCCV 11/3 , failed to convert , remains in A. Fibrillation. 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  Midodrine was added for low BP, He seems to feel better on milrinone, Plan:Trail of home milrinone, that is arranged but will be attached to patient tomorrow.  Assessment & Plan:   Principal Problem:   Acute on chronic systolic CHF (congestive heart failure) (HCC) Active Problems:   HLD (hyperlipidemia)   History of stroke   Type 2 diabetes mellitus without complication, without long-term current use of insulin (HCC)   Persistent atrial fibrillation (HCC)   Cardiogenic shock (HCC)   Hyponatremia   Hypothermia   Goals of care, counseling/discussion   Encounter  for hospice care discussion   Palliative care by specialist   1. Acute on chronic systolic CHF-  Echo in 2021 showed EF 20 to 25%, repeat echo done during this hospitalization showed EF of 15 to 20%.  BNP 1651.  Initially started on Lasix however became bradycardic and hypotensive so Lasix was held.  Likely cardiogenic shock.  Heart failure team consulted. Patient started on milrinone and IV Lasix.  Patient underwent DCCV 11/3,  unable to convert,  remains in A. Fib. Lasix transitioned to torsemide. 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  He seems to feel better on milrinone, Plan: trial of home milrinone.     Case manager helping with arrangement. Patient will be discharged home tomorrow . Home with hospice, milrinone infusion.   2. Ascites- s/p paracentesis on 11/07/2019 with 4.8 liters fluid removal.  Likely from right heart failure.  3. Cardiogenic shock- Patient was started on IV Lasix however became bradycardic and hypotensive, Lasix was held and patient was transferred to [facility] for evaluation by heart failure team.  At this time he is on milrinone , Lasix switched to torsemide.  Heart failure team following. Midodrine was added for low BP, Plan: Trial of home milrinone  4. Hyponatremia- Resolved, secondary likely hypervolemic hyponatremia from underlying CHF.  Sodium has improved today to 135.  He received 1 dose of tolvaptan on 11/9.  Continue diuresis with inotropic support as above.  We will follow serum sodium level in a.m.    5. Persistent atrial fibrillation-beta-blockers on hold due to hypotension.  Continue digoxin and Eliquis.  Amiodarone discontinued.  Patient underwent DCCV, unable to convert remains in A. fib.  6. Diabetes mellitus type 2- He was hypoglycemic on admission, continue  hypoglycemic protocol.  Sliding scale insulin with NovoLog.  7. History of stroke-residual severe expressive aphasia, has right-sided paralysis.  Remains at baseline.    DVT prophylaxis: Eliquis Code Status: Full Family Communication: No one at bed side. Disposition Plan:    Status is: Inpatient  Remains inpatient appropriate because:Inpatient level of care appropriate due to severity of illness   Dispo: The patient is from: Home              Anticipated d/c is to: Home with hospice              Anticipated d/c date is: 11/9               Patient currently is not medically stable to d/c.   Consultants:   Cardiology  Procedures:Cardioversion  Antimicrobials:  Anti-infectives (From admission, onward)   None      Subjective: Patient was seen and examined at bedside,  overnight events noted.  He is s/p DCCV remains in a/ fib unable to convert. He denies any symptoms. Digoxin was discontinued due to bradycardia.    Objective: Vitals:   11/15/19 0001 11/15/19 0402 11/15/19 0806 11/15/19 1301  BP: 104/76  97/71 117/78  Pulse:   (!) 56 (!) 55  Resp:   20 19  Temp: 98 F (36.7 C) (!) 97.5 F (36.4 C) 98.6 F (37 C) 97.8 F (36.6 C)  TempSrc: Oral Oral Oral Oral  SpO2:   99% 100%  Weight:  61.1 kg    Height:        Intake/Output Summary (Last 24 hours) at 11/15/2019 1631 Last data filed at 11/15/2019 0452 Gross per 24 hour  Intake 138.67 ml  Output 550 ml  Net -411.33 ml   Filed Weights   11/13/19 0451 11/14/19 0302 11/15/19 0402  Weight: 54.5 kg 63.2 kg 61.1 kg    Examination:  General exam: Appears calm and comfortable, denies any complaints. Respiratory system: Clear to auscultation. Respiratory effort normal. Cardiovascular system: S1 & S2 heard, irregular rhythm. No JVD, murmurs, rubs, gallops or clicks. No pedal edema. Gastrointestinal system: Abdomen is nondistended, soft and nontender. No organomegaly or masses felt.  Normal bowel sounds heard. Central nervous system: Alert and oriented.  Expressive aphasia, right sided weakness. Extremities: No edema, no cyanosis, no clubbing. Skin: No  rashes, lesions or ulcers Psychiatry: Judgement and insight appear normal. Mood & affect appropriate.     Data Reviewed: I have personally reviewed following labs and imaging studies  CBC: Recent Labs  Lab 11/09/19 0608 11/09/19 0608 11/10/19 0450 11/10/19 0450 11/11/19 0500 11/11/19 0500 11/11/19 0808 11/12/19 0106 11/13/19 0046 11/14/19 0535 11/15/19 0441  WBC 5.8  --  5.9  --  6.5  --   --   --   --   --   --   NEUTROABS 4.3  --  4.4  --   --   --   --   --   --   --   --   HGB 10.9*   < > 11.1*   < > 11.6*   < > 13.3  13.6 12.4* 11.0* 10.8* 11.0*  HCT 32.0*   < > 33.1*   < > 34.3*   < > 39.0  40.0 36.7* 33.3* 32.4* 33.4*  MCV 93.0  --  93.2  --  92.0  --   --   --   --   --   --   PLT 186  --  200  --  203  --   --   --   --   --   --    < > = values in this interval not displayed.   Basic Metabolic Panel: Recent Labs  Lab 11/11/19 0815 11/12/19 0106 11/13/19 0046 11/14/19 0535 11/15/19 0441  NA 132* 133* 134* 135 135  K 3.8 3.9 3.9 4.0 4.4  CL 95* 97* 100 100 99  CO2 27 25 23 27 25   GLUCOSE 113* 160* 180* 134* 130*  BUN 20 25* 30* 30* 27*  CREATININE 1.14 1.16 1.33* 1.10 1.09  CALCIUM 8.8* 9.2 8.6* 8.9 9.0  MG 1.9 2.4 2.1 2.1 2.4  PHOS 4.7* 3.8 3.6 3.4 3.5   GFR: Estimated Creatinine Clearance: 59.2 mL/min (by C-G formula based on SCr of 1.09 mg/dL). Liver Function Tests: Recent Labs  Lab 11/13/19 0046 11/14/19 0535 11/15/19 0441  AST 89* 111* 100*  ALT 48* 61* 61*  ALKPHOS 185* 184* 191*  BILITOT 1.1 1.4* 1.3*  PROT 6.8 6.7 7.0  ALBUMIN 3.3* 3.2* 3.3*   No results for input(s): LIPASE, AMYLASE in the last 168 hours. No results for input(s): AMMONIA in the last 168 hours. Coagulation Profile: Recent Labs  Lab 11/10/19 0450  INR 1.6*   Cardiac Enzymes: No results for input(s): CKTOTAL, CKMB, CKMBINDEX, TROPONINI in the last 168 hours. BNP (last 3 results) No results for input(s): PROBNP in the last 8760 hours. HbA1C: No results for  input(s): HGBA1C in the last 72 hours. CBG: Recent Labs  Lab 11/14/19 1120 11/14/19 1735 11/14/19 2101 11/15/19 0721 11/15/19 1131  GLUCAP 129* 197* 141* 101* 280*   Lipid Profile: No results for input(s): CHOL, HDL, LDLCALC, TRIG, CHOLHDL, LDLDIRECT in the last 72 hours. Thyroid Function Tests: No results for input(s): TSH, T4TOTAL, FREET4, T3FREE, THYROIDAB in the last 72 hours. Anemia Panel: No results for input(s): VITAMINB12, FOLATE, FERRITIN, TIBC, IRON, RETICCTPCT in the last 72 hours. Sepsis Labs: No results for input(s): PROCALCITON, LATICACIDVEN in the last 168 hours.  No results found for this or any previous visit (from the past 240 hour(s)).   Radiology Studies: No results found.  Scheduled Meds: . apixaban  5 mg Oral BID  . chlorhexidine  15 mL Mouth Rinse BID  . Chlorhexidine Gluconate Cloth  6 each Topical Daily  . insulin aspart  0-9 Units Subcutaneous TID WC  . lidocaine  1 application Urethral Once  . mouth rinse  15 mL Mouth Rinse q12n4p  . midodrine  2.5 mg Oral TID WC  . simvastatin  40 mg Oral Daily  . sodium chloride flush  10-40 mL Intracatheter Q12H  . sodium chloride flush  3 mL Intravenous Q12H  . spironolactone  12.5 mg Oral Daily  . torsemide  20 mg Oral Daily   Continuous Infusions: . milrinone 0.125 mcg/kg/min (11/15/19 1555)     LOS: 10 days    Time spent: 25 mins.    13/08/21, MD [facility]   If 7PM-7AM, please contact night-coverage

## 2019-11-16 DIAGNOSIS — I5023 Acute on chronic systolic (congestive) heart failure: Secondary | ICD-10-CM | POA: Diagnosis not present

## 2019-11-16 LAB — BASIC METABOLIC PANEL
Anion gap: 8 (ref 5–15)
BUN: 28 mg/dL — ABNORMAL HIGH (ref 8–23)
CO2: 26 mmol/L (ref 22–32)
Calcium: 9.3 mg/dL (ref 8.9–10.3)
Chloride: 103 mmol/L (ref 98–111)
Creatinine, Ser: 1.08 mg/dL (ref 0.61–1.24)
GFR, Estimated: >60 mL/min (ref >60)
Glucose, Bld: 119 mg/dL — ABNORMAL HIGH (ref 70–99)
Potassium: 4.5 mmol/L (ref 3.5–5.1)
Sodium: 137 mmol/L (ref 135–145)

## 2019-11-16 LAB — GLUCOSE, CAPILLARY
Glucose-Capillary: 102 mg/dL — ABNORMAL HIGH (ref 70–99)
Glucose-Capillary: 153 mg/dL — ABNORMAL HIGH (ref 70–99)
Glucose-Capillary: 142 mg/dL — ABNORMAL HIGH (ref 70–99)

## 2019-11-16 LAB — COOXEMETRY PANEL
Carboxyhemoglobin: 1.1 % (ref 0.5–1.5)
Methemoglobin: 0.6 % (ref 0.0–1.5)
O2 Saturation: 54.8 %
Total hemoglobin: 11.1 g/dL — ABNORMAL LOW (ref 12.0–16.0)

## 2019-11-16 MED ORDER — SPIRONOLACTONE 25 MG PO TABS
12.5000 mg | ORAL_TABLET | Freq: Every day | ORAL | 0 refills | Status: DC
Start: 2019-11-17 — End: 2019-11-24

## 2019-11-16 MED ORDER — TORSEMIDE 20 MG PO TABS
40.0000 mg | ORAL_TABLET | Freq: Every day | ORAL | Status: DC
  Administered 2019-11-16: 40 mg via ORAL
  Filled 2019-11-16: qty 2

## 2019-11-16 MED ORDER — MIDODRINE HCL 2.5 MG PO TABS
2.5000 mg | ORAL_TABLET | Freq: Three times a day (TID) | ORAL | 1 refills | Status: DC
Start: 2019-11-16 — End: 2020-03-27

## 2019-11-16 MED ORDER — MILRINONE LACTATE IN DEXTROSE 20-5 MG/100ML-% IV SOLN: 0.2500 ug/kg/min | INTRAVENOUS | 0 refills | Status: DC

## 2019-11-16 MED ORDER — ALUM & MAG HYDROXIDE-SIMETH 200-200-20 MG/5ML PO SUSP
30.0000 mL | ORAL | Status: DC | PRN
  Administered 2019-11-16 (×2): 30 mL via ORAL
  Filled 2019-11-16 (×2): qty 30

## 2019-11-16 MED ORDER — TORSEMIDE 20 MG PO TABS
40.0000 mg | ORAL_TABLET | Freq: Every day | ORAL | 1 refills | Status: DC
Start: 2019-11-17 — End: 2020-01-11

## 2019-11-16 NOTE — Discharge Summary (Signed)
Physician Discharge Summary  [name] EAV:[id number] DOB: 08-Oct-1955 DOA: 11/04/2019  PCP: Iona Beard, MD  Admit date: 11/04/2019   Discharge date: 11/16/2019  Admitted From:  Home.  Disposition:  Home with Hospice ( [facility])  Recommendations for Outpatient Follow-up:  1. Follow up with PCP in 1-2 weeks. 2. Please obtain BMP/CBC in one week. 3. Advised to follow-up with the heart failure team Amy clegg in clinic in 1 week. 4. Patient is being discharged on milrinone infusion for heart failure. 5. Home health services been arranged.  Home Health: Yes. Equipment/Devices: Milrinone Infusion Kit  Discharge Condition: Stable CODE STATUS:DNR Diet recommendation: Heart Healthy  Brief Summary: This 64 year old male with a history of stroke with expressive aphasia, right-sided paralysis, nonischemic cardiomyopathy, systolic CHF with EF 20 to 25% as of April 2021 echo, hypertension, diabetes mellitus type 2, persistent atrial flutter/atrial fibrillation came to ED with chief complaints of dyspnea. Patient became progressively more dyspneic at home. He had been ambulating with cane and started developing worsening distention/swelling in the abdomen. His Lasix dosing was increased outpatient per his cardiologist with minimal improvement. Due to progressively worsening dyspnea and abdominal swelling he was brought to the ED for further evaluation.  Hospital course: He was found to have elevated BNP, indeterminate troponins. He was admitted in [facility]. Initially started on Lasix drip for diuresis and abdominal ascites. He became hypotensive and bradycardic requiring discontinuation of Lasix. Cardiology was consulted for concern of development cardiogenic shock. Cardiology recommended transfer to [facility] for evaluation by Heart failure team. Entresto, metoprolol, Lasix were discontinued. Patient underwent DCCV on 11/3 , failed to convert , remains in A. Fibrillation.  Patient was started on Milrinone infusion and managed primarily by heart failure team. 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  Midodrine was added for low BP, He seemed to feel better on milrinone. He was not a good candidate for VAD. Patient was assessed by our LVAD nurse, She had significant concerns about his right arm/hand weakness. He would be unable to manage his LVAD equipment and would need consistent 24 hr caregiver (not available).  Patient cleared from CHF team to discharge home with [facility] on milrinone infusion and he will follow up CHF team in one week. Home health services arranged.  Home meds for d/c Milrinone 0.25 mcg-->[facility] team.  Torsemide 40 mg daily  Spironolactone 12.5 mg daily  Midodrine 2.5 mg three times a day  Eliquis 5 mg twice a day  Simvastatin 40 mg daily   He was managed for below problems.   Discharge Diagnoses:  Principal Problem:   Acute on chronic systolic CHF (congestive heart failure) (HCC) Active Problems:   HLD (hyperlipidemia)   History of stroke   Type 2 diabetes mellitus without complication, without long-term current use of insulin (HCC)   Persistent atrial fibrillation (HCC)   Cardiogenic shock (HCC)   Hyponatremia   Hypothermia   Goals of care, counseling/discussion   Encounter for hospice care discussion   Palliative care by specialist  1. Acute on chronic systolic CHF-  Echo in 7829 showed EF 20 to 25%, repeat echo done during this hospitalization showed EF of 15 to 20%. BNP 1651. Initially started on Lasix however became bradycardic and hypotensive so Lasix was held. Likely cardiogenic shock. Heart failure team consulted. Patient started on milrinone and IV Lasix.  Patient underwent DCCV 11/3,  unable to convert,  remains in A. Fib. Lasix transitioned to torsemide. 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  He seems to feel  better on milrinone, Plan: trial of home milrinone.     Case manager helping with arrangement. Patient being discharged home today. Home with hospice, milrinone infusion.   2. Ascites- s/p paracentesis on 11/07/2019 with 4.8liters fluid removal. Likely from right heart failure.  3. Cardiogenic shock- Patient was started on IV Lasix however became bradycardic and hypotensive,Lasix was held and patient was transferred to [facility] for evaluation by heart failure team. At this time he is on milrinone , Lasix switched to torsemide. Heart failure team following. Midodrine was added for low BP, Plan: Trial of home milrinone  4. Hyponatremia- Resolved,secondary likely hypervolemic hyponatremia from underlying CHF. Sodium has improved today to 135. He received 1 dose of tolvaptan on 11/9. Continue diuresis with inotropic support as above. We will follow serum sodium level in a.m.   5. Persistent atrial fibrillation-beta-blockers on hold due to hypotension. Digoxin discontinued due to bradycardia. Continue Eliquis. Amiodarone discontinued.  Patient underwent DCCV, unable to convert remains in A. fib.  6. Diabetes mellitus type 2- He was hypoglycemic on admission, continue hypoglycemic protocol. Sliding scale insulin with NovoLog.  7. History of stroke-residual severe expressive aphasia, has right-sided paralysis. Remains at baseline.   Discharge Instructions  Discharge Instructions    AMB referral to CHF clinic   Complete by: As directed    Call MD for:  difficulty breathing, headache or visual disturbances   Complete by: As directed    Call MD for:  persistant dizziness or light-headedness   Complete by: As directed    Call MD for:  persistant nausea and vomiting   Complete by: As directed    Diet - low sodium heart healthy   Complete by: As directed    Diet Carb Modified   Complete by: As directed    Discharge instructions   Complete by: As directed    Advised to  follow-up with primary care physician in 1 week.   Advised to follow-up with heart failure team as scheduled. Patient has been discharged on milrinone infusion that has been arranged.   Increase activity slowly   Complete by: As directed      Allergies as of 11/16/2019   No Known Allergies     Medication List    STOP taking these medications   Entresto 24-26 MG Generic drug: sacubitril-valsartan   furosemide 40 MG tablet Commonly known as: LASIX   glipiZIDE 10 MG tablet Commonly known as: GLUCOTROL   loratadine 10 MG tablet Commonly known as: CLARITIN   metFORMIN 1000 MG tablet Commonly known as: GLUCOPHAGE   metoprolol succinate 25 MG 24 hr tablet Commonly known as: Toprol XL     TAKE these medications   Accu-Chek FastClix Lancets Misc 1 each by Other route as directed.   Accu-Chek Guide test strip Generic drug: glucose blood 1 each by Other route See admin instructions.   acetaminophen 500 MG tablet Commonly known as: TYLENOL Take 1,000 mg by mouth every 6 (six) hours as needed for pain.   apixaban 5 MG Tabs tablet Commonly known as: Eliquis Take 1 tablet (5 mg total) by mouth 2 (two) times daily.   aspirin 81 MG EC tablet Take 1 tablet (81 mg total) by mouth daily.   midodrine 2.5 MG tablet Commonly known as: PROAMATINE Take 1 tablet (2.5 mg total) by mouth 3 (three) times daily with meals.   milrinone 20 MG/100 ML Soln infusion Commonly known as: PRIMACOR Inject  0.0139 mg/min into the vein continuous.   multivitamin with minerals Tabs tablet Take 1 tablet by mouth daily.   omega-3 acid ethyl esters 1 g capsule Commonly known as: LOVAZA Take 1 g by mouth daily.   simvastatin 40 MG tablet Commonly known as: ZOCOR Take 40 mg by mouth daily.   spironolactone 25 MG tablet Commonly known as: ALDACTONE Take 0.5 tablets (12.5 mg total) by mouth daily. Start taking on: November 17, 2019   torsemide 20 MG tablet Commonly known as: DEMADEX Take 2  tablets (40 mg total) by mouth daily. Start taking on: November 17, 2019            Durable Medical Equipment  (From admission, onward)         Start     Ordered   11/15/19 1250  Heart failure home health orders  (Heart failure home health orders / Face to face)  Once       Comments: Heart Failure Follow-up Care:  Verify follow-up appointments per Patient Discharge Instructions. Confirm transportation arranged. Reconcile home medications with discharge medication list. Remove discontinued medications from use. Assist patient/caregiver to manage medications using pill box. Reinforce low sodium food selection Assessments: Vital signs and oxygen saturation at each visit. Assess home environment for safety concerns, caregiver support and availability of low-sodium foods. Consult Education officer, museum, PT/OT, Dietitian, and CNA based on assessments. Perform comprehensive cardiopulmonary assessment. Notify MD for any change in condition or weight gain of 3 pounds in one day or 5 pounds in one week with symptoms. Daily Weights and Symptom Monitoring: Ensure patient has access to scales. Teach patient/caregiver to weigh daily before breakfast and after voiding using same scale and record.    Teach patient/caregiver to track weight and symptoms and when to notify Provider. Activity: Develop individualized activity plan with patient/caregiver.    Home Paraenteral Inotropic Therapy : Data Collection Form  Patients name: [name]   Date: 11/15/19 NYHA IV  Information below may not be completed by the supplier nor anyone in a Financial relationship with the supplier.  1. Results of invasive hemodynamic monitoring  Cardiac Index Before Inotrope infusion:          1.5             On Inotrope infusion:            1.9           Drug and dose:   Milrinone 0.125 mcg   2. Cardiac medications immediately prior to inotrope infusion (List name, dose, and frequency) Laisx.   3. Dose this  represent maximum tolerated doses of these medications? Yes.   4. Breathing status Prior to inotrope infusion: Dyspnea at rest  At time of discharge: Dyspnea on moderate  exertion.   5. Initial home prescription Drug and Dose:   Milrinone 0.125 for continuous infusion 24/hr day and 7 days/week  6. If continuous infusion is prescribed, have attempts to discontinue inotrope infusion in the hospital failed?   Yes.   7. If intermittent infusion is prescribed, have there been repeated hospitalizations for heart failure which Parenteral inotrope were required? Not applicable.   8. Is patient capable of going to the physician for outpatient evaluation? Yes.   9. Is routine electrocardiographic monitoring required in the Home?  No.   The above statements and any additional explanations included separately are true and accurate and there is documentation present in the patients medical record to support these statements.   Completed by  Darrick Grinder, NP   [facility] to provide  Labs every other week to include BMET, Mg, and CBC with Diff. Additional as needed. Should be drawn via PERIPHERAL stick. NOT PICC line.   [id number] Milrinone 0.125  mcg/kg/min X 52 weeks  A4221 Supplies for maintenance of drug infusion catheter A4222 Supplies for the external drug infusion per cassette or bag E0781 Ambulatory Infusion pump   In instances where this form was completed by an Advanced Practice Provider, please see EMR for physician Co-Signature.  Question Answer Comment  Heart Failure Follow-up Care [facility] ([facility]) [facility] at [phone]   Obtain the following labs Basic Metabolic Panel   Lab frequency Weekly   Fax lab results to AHF Clinic at [phone]   Diet Low Sodium Heart Healthy   Fluid restrictions: 1800 mL Fluid      11/15/19 1252          Contact information for follow-up providers    [facility] Follow up on 11/24/2019.   Specialty:  Cardiology Why: at [employer] information: [address] [phone] [place], [place] Follow up.   Why: Hospice Services via [facility]- Registered Nurse to call with visit times. Hospice Agency to discuss [place].  Contact information: [place] [address] [phone]        Darrick Grinder D, NP Follow up in 1 week(s).   Specialty: Cardiology Contact information: [address] [place] [address] [phone]            Contact information for after-discharge care    [facility] .   Service: Skilled Nursing Contact information: [place] [phone]                 No Known Allergies  Consultations:  Cardiology,  CHF team   Procedures/Studies: CT ABDOMEN PELVIS WO CONTRAST  Result Date: 11/05/2019 CLINICAL DATA:  Abdominal distension with urinary retention. EXAM: CT ABDOMEN AND PELVIS WITHOUT CONTRAST TECHNIQUE: Multidetector CT imaging of the abdomen and pelvis was performed following the standard protocol without IV contrast. COMPARISON:  07/13/2019 FINDINGS: Lower chest:  Small right pleural effusion.  Cardiomegaly. Hepatobiliary: No focal liver abnormality.No evidence of biliary obstruction or stone. Pancreas: Unremarkable. Spleen: Unremarkable. Adrenals/Urinary Tract: Negative adrenals. No hydronephrosis or stone. No over distension of the bladder. Small volume bladder gas likely related to prior manipulation. Stomach/Bowel:  No obstruction. No visible bowel inflammation Vascular/Lymphatic: No acute vascular abnormality. Atheromatous calcification. No mass or adenopathy. Reproductive:Negative Other: Large volume ascites which has increased from prior and has progressed. Musculoskeletal: No acute abnormalities. IMPRESSION: 1. Large volume ascites, increased from July 2021.  2. History of urinary retention but the bladder is not over distended currently. 3. Small right pleural effusion. Electronically Signed   By: Monte Fantasia M.D.   On: 11/05/2019 06:48   DG Chest 2 View  Result Date: 11/04/2019 CLINICAL DATA:  Shortness of breath EXAM: CHEST - 2 VIEW COMPARISON:  08/06/2012, 07/12/2019 CT 07/13/2019 FINDINGS: Moderate cardiomegaly. Possible trace pleural effusion. No consolidation or pneumothorax. IMPRESSION: Cardiomegaly with possible trace pleural effusions. Electronically Signed   By: Donavan Foil M.D.   On: 11/04/2019 19:05   DG Abd 1 View  Result Date: 11/05/2019 CLINICAL DATA:  Abdominal distention and pain. EXAM: ABDOMEN - 1 VIEW COMPARISON:  CT 07/13/2019.  Abdomen 12/02/2005. FINDINGS: Overall increased density of the abdomen noted. Ascites cannot be excluded. Slightly prominent air-filled loops of small and large bowel noted. Mild adynamic ileus may be present. Follow-up exam suggested demonstrate resolution and to exclude developing bowel obstruction. No free air. No acute bony abnormality. Degenerative changes lumbar spine. IMPRESSION: 1. Overall increased density of the abdomen. Ascites cannot be excluded. 2. Slightly prominent air-filled loops of small and large bowel noted. Mild adynamic ileus may be present. Follow-up exam suggested demonstrate resolution and to exclude developing bowel obstruction. Electronically Signed   By: Marcello Moores  Register   On: 11/05/2019 05:12   NM CARDIAC AMYLOID TUMOR LOC INFLAM SPECT 1 DAY  Result Date: 11/10/2019 CLINICAL DATA:  HEART FAILURE. CONCERN FOR CARDIAC AMYLOIDOSIS. EXAM: NUCLEAR MEDICINE TUMOR LOCALIZATION. PYP CARDIAC AMYLOIDOSIS SCAN WITH SPECT TECHNIQUE: Following intravenous administration of radiopharmaceutical, anterior planar images of the chest were obtained. Regions of interest were placed on the heart and contralateral chest wall for quantitative assessment. Additional SPECT imaging of the chest was  obtained. RADIOPHARMACEUTICALS:  8 mCi TECHNETIUM 99 PYROPHOSPHATE FINDINGS: Planar Visual assessment: Anterior planar imaging demonstrates radiotracer uptake within the heart equal to uptake within the adjacent ribs (Grade 2). Quantitative assessment : Quantitative assessment of the cardiac uptake compared to the contralateral chest wall is equal to 1.64 (H/CL = 1.64 (manual recalculation) SPECT assessment: SPECT imaging of the chest demonstrates moderate radiotracer accumulation within the LEFT ventricle. 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 Electronically Signed   By: Suzy Bouchard M.D.   On: 11/10/2019 14:22   CARDIAC CATHETERIZATION  Result Date: 11/11/2019 1. Filling pressures optimized. 2. Low cardiac output. Will restart milrinone at 0.125  US Paracentesis  Result Date: 11/07/2019 INDICATION: Abdominal distention. History of congestive heart failure. Ascites. Request for therapeutic paracentesis. EXAM: ULTRASOUND GUIDED RIGHT LOWER QUADRANT PARACENTESIS MEDICATIONS: 1% plain lidocaine, 5 mL COMPLICATIONS: None immediate. PROCEDURE: Informed written consent was obtained from the patient after a discussion of the risks, benefits and alternatives to treatment. A timeout was performed prior to the initiation of the procedure. Initial ultrasound scanning demonstrates a large amount of ascites within the right lower abdominal quadrant. The right lower abdomen was prepped and draped in the usual sterile fashion. 1% lidocaine was used for local anesthesia. Following this, a 19 gauge, 7-cm, Yueh catheter was introduced. An ultrasound image was saved for documentation purposes. The paracentesis was performed. The catheter was removed and a dressing was applied. The patient tolerated the procedure well without immediate post procedural complication. FINDINGS: A total of approximately 4.8 L of clear yellow fluid was removed.  IMPRESSION: Successful ultrasound-guided paracentesis yielding 4.8 liters of peritoneal fluid. Read by: Ascencion Dike PA-C Electronically Signed   By: Corrie Mckusick D.O.   On: 11/07/2019 14:40   DG CHEST PORT 1 VIEW  Result Date: 11/07/2019 CLINICAL DATA:  PICC line. EXAM: PORTABLE CHEST 1 VIEW COMPARISON:  11/05/2019 FINDINGS: Interval placement of LEFT-sided PICC line, tip overlying the superior vena cava. The heart is enlarged. Trace RIGHT pleural effusions probably stable. There is increased minimal subsegmental atelectasis at both lung bases. No pulmonary edema. IMPRESSION: 1. Interval placement of LEFT-sided PICC line. 2. Increased bibasilar atelectasis. Electronically Signed   By: Nolon Nations M.D.   On: 11/07/2019 12:51   DG Chest Port 1 View  Result Date: 11/05/2019 CLINICAL DATA:  CHF. EXAM: PORTABLE CHEST 1 VIEW COMPARISON:  11/04/2019. FINDINGS: Cardiomegaly with mild pulmonary venous congestion again  noted. Similar findings on prior exam. Low lung volumes with mild bibasilar atelectasis. No pleural effusion or pneumothorax. No acute bony abnormality identified. IMPRESSION: 1. Cardiomegaly with mild pulmonary venous congestion again noted. Similar findings on prior exam. 2. Low lung volumes with mild bibasilar atelectasis. Electronically Signed   By: Marcello Moores  Register   On: 11/05/2019 05:34   MR CARDIAC MORPHOLOGY W WO CONTRAST  Result Date: 11/08/2019 CLINICAL DATA:  Cardiomyopathy of uncertain etiology EXAM: CARDIAC MRI TECHNIQUE: The patient was scanned on a 1.5 Tesla GE magnet. A dedicated cardiac coil was used. Functional imaging was done using Fiesta sequences. 2,3, and 4 chamber views were done to assess for RWMA's. Modified Simpson's rule using a short axis stack was used to calculate an ejection fraction on a dedicated work Conservation officer, nature. The patient received 8 cc of Gadavist. After 10 minutes inversion recovery sequences were used to assess for infiltration and  scar tissue. FINDINGS: Small right pleural effusion. Trivial pericardial effusion. Normal left ventricular size and wall thickness. Diffuse hypokinesis worse in the septum with EF 24%. Mildly dilated RV with mildly decreased systolic function, EF 97%. Severe biatrial enlargement. There is at least moderate tricuspid regurgitation. Mild mitral regurgitation. Trileaflet aortic valve with no stenosis, mild regurgitation. Delayed enhancement imaging: Extensive, primarily mid-wall and subepicardial late gadolinium enhancement (LGE) throughout a large portion of the left ventricle. It also appears to involve the RV free wall and the atrial walls/interatrial septum. Measurements: LVEDV 133 mL LVSV 33 mL LVEF 24% RVEDV 198 mL RVSV 82 mL RVEF 42% Extracellular volume percentage 41% IMPRESSION: 1. Normal LV size with normal wall thickness. EF 24%, diffuse hypokinesis worse in the septum. 2. Mildly dilated RV with mildly decreased systolic function, EF 02%. 3.  Severe biatrial enlargement. 4. Extensive LGE in a non-coronary distribution throughout much of the LV, also involving RV free wall and left and right atrial walls. ECV 42%. This is suggestive of infiltrative disease. The walls are not thick, making amyloidosis and Fabry's less likely. Myocarditis is a consideration. Dalton Mclean Electronically Signed   By: Loralie Champagne M.D.   On: 11/08/2019 13:49   ECHOCARDIOGRAM COMPLETE  Result Date: 11/05/2019    ECHOCARDIOGRAM REPORT   Patient Name:   JEREMIYAH Christoffersen Date of Exam: 11/05/2019 Medical Rec #:  [id number]       Height:       67.0 in Accession #:    [id number]      Weight:       154.5 lb Date of Birth:  03-26-1955      BSA:          1.812 m Patient Age:    50 years        BP:           141/75 mmHg Patient Gender: M               HR:           68 bpm. Exam Location:  Inpatient Procedure: 2D Echo, Cardiac Doppler and Color Doppler Indications:    CHF  History:        Patient has prior history of Echocardiogram  examinations, most                 recent 05/07/2019. CHF and Cardiomyopathy, Stroke,                 Arrythmias:Atrial Fibrillation, Signs/Symptoms:Dyspnea and  Pleural effusion; Risk Factors:Hypertension and Diabetes.  Sonographer:    Dustin Flock Referring Phys: [id number] ASIA B [place]  Sonographer Comments: Patient very confused. IMPRESSIONS  1. Left ventricular ejection fraction, by estimation, is 15-20%. The left ventricle has severely decreased function. The left ventricle demonstrates global hypokinesis. Left ventricular diastolic function could not be evaluated. Elevated left atrial pressure. There is the interventricular septum is flattened in diastole ('D' shaped left ventricle), consistent with right ventricular volume overload.  2. Right ventricular systolic function is severely reduced. The right ventricular size is severely enlarged. There is mildly elevated pulmonary artery systolic pressure. The estimated right ventricular systolic pressure is 39.7 mmHg.  3. Left atrial size was severely dilated.  4. Right atrial size was severely dilated.  5. The mitral valve is grossly normal. Mild mitral valve regurgitation. No evidence of mitral stenosis.  6. The tricuspid valve is abnormal. Tricuspid valve regurgitation is severe.  7. The aortic valve is tricuspid. Aortic valve regurgitation is not visualized. No aortic stenosis is present.  8. The inferior vena cava is dilated in size with <50% respiratory variability, suggesting right atrial pressure of 15 mmHg. Comparison(s): Changes from prior study are noted. LVEF remains ~20% with global HK. RV severely dilated with severely reduced function. TR is now severe. FINDINGS  Left Ventricle: Left ventricular ejection fraction, by estimation, is 15-20%. The left ventricle has severely decreased function. The left ventricle demonstrates global hypokinesis. The left ventricular internal cavity size was normal in size. There is no left  ventricular hypertrophy. The interventricular septum is flattened in diastole ('D' shaped left ventricle), consistent with right ventricular volume overload. Left ventricular diastolic function could not be evaluated due to atrial fibrillation. Left ventricular diastolic function could not be evaluated. Elevated left atrial pressure. Right Ventricle: The right ventricular size is severely enlarged. No increase in right ventricular wall thickness. Right ventricular systolic function is severely reduced. There is mildly elevated pulmonary artery systolic pressure. The tricuspid regurgitant velocity is 2.49 m/s, and with an assumed right atrial pressure of 15 mmHg, the estimated right ventricular systolic pressure is 67.3 mmHg. Left Atrium: Left atrial size was severely dilated. Right Atrium: Right atrial size was severely dilated. Pericardium: Trivial pericardial effusion is present. Mitral Valve: The mitral valve is grossly normal. Mild mitral valve regurgitation. No evidence of mitral valve stenosis. Tricuspid Valve: The tricuspid valve is abnormal. Tricuspid valve regurgitation is severe. No evidence of tricuspid stenosis. Aortic Valve: The aortic valve is tricuspid. Aortic valve regurgitation is not visualized. No aortic stenosis is present. Pulmonic Valve: The pulmonic valve was grossly normal. Pulmonic valve regurgitation is mild. No evidence of pulmonic stenosis. Aorta: The aortic root is normal in size and structure. Venous: The inferior vena cava is dilated in size with less than 50% respiratory variability, suggesting right atrial pressure of 15 mmHg. IAS/Shunts: The atrial septum is grossly normal.  LEFT VENTRICLE PLAX 2D LVIDd:         4.80 cm  Diastology LVIDs:         4.00 cm  LV e' medial:    4.13 cm/s LV PW:         1.10 cm  LV E/e' medial:  12.4 LV IVS:        1.10 cm  LV e' lateral:   8.16 cm/s LVOT diam:     2.00 cm  LV E/e' lateral: 6.3 LV SV:         47 LV SV Index:   26 LVOT Area:  3.14 cm   RIGHT VENTRICLE RV Basal diam:  5.00 cm RV S prime:     5.33 cm/s TAPSE (M-mode): 2.1 cm LEFT ATRIUM              Index       RIGHT ATRIUM           Index LA diam:        5.70 cm  3.15 cm/m  RA Area:     32.00 cm LA Vol (A2C):   111.0 ml 61.25 ml/m RA Volume:   123.00 ml 67.87 ml/m LA Vol (A4C):   94.2 ml  51.98 ml/m LA Biplane Vol: 105.0 ml 57.94 ml/m  AORTIC VALVE LVOT Vmax:   86.80 cm/s LVOT Vmean:  55.500 cm/s LVOT VTI:    0.151 m  AORTA Ao Root diam: 2.70 cm MITRAL VALVE               TRICUSPID VALVE MV Area (PHT): 3.42 cm    TR Peak grad:   24.8 mmHg MV Decel Time: 222 msec    TR Vmax:        249.00 cm/s MV E velocity: 51.20 cm/s MV A velocity: 23.15 cm/s  SHUNTS MV E/A ratio:  2.21        Systemic VTI:  0.15 m                            Systemic Diam: 2.00 cm Eleonore Chiquito MD Electronically signed by Eleonore Chiquito MD Signature Date/Time: 11/05/2019/1:35:08 PM    Final    VAS [name] UPPER EXTREMITY VENOUS DUPLEX  Result Date: 11/08/2019 UPPER VENOUS STUDY  Indications: Edema Comparison Study: No prior study Performing Technologist: Maudry Mayhew MHA, RDMS, RVT, RDCS  Examination Guidelines: A complete evaluation includes B-mode imaging, spectral Doppler, color Doppler, and power Doppler as needed of all accessible portions of each vessel. Bilateral testing is considered an integral part of a complete examination. Limited examinations for reoccurring indications may be performed as noted.  Right Findings: +----------+------------+---------+-----------+----------+-------+ RIGHT     CompressiblePhasicitySpontaneousPropertiesSummary +----------+------------+---------+-----------+----------+-------+ IJV           Full       No        Yes                      +----------+------------+---------+-----------+----------+-------+ Subclavian    Full       No        Yes                      +----------+------------+---------+-----------+----------+-------+ Axillary      Full       No         Yes                      +----------+------------+---------+-----------+----------+-------+ Brachial      Full       No        Yes                      +----------+------------+---------+-----------+----------+-------+ Radial        Full                                          +----------+------------+---------+-----------+----------+-------+ Ulnar  Full                                          +----------+------------+---------+-----------+----------+-------+ Cephalic      Full                                          +----------+------------+---------+-----------+----------+-------+ Basilic       Full                                          +----------+------------+---------+-----------+----------+-------+  Left Findings: +----------+------------+---------+-----------+----------+-------+ LEFT      CompressiblePhasicitySpontaneousPropertiesSummary +----------+------------+---------+-----------+----------+-------+ Subclavian               No        Yes                      +----------+------------+---------+-----------+----------+-------+  Summary:  Right: No evidence of deep vein thrombosis in the upper extremity. No evidence of superficial vein thrombosis in the upper extremity.  Left: No evidence of thrombosis in the subclavian.  Upper extremity venous flow is pulsatile, suggestive of possibly elevated right sided heart pressure. *See table(s) above for measurements and observations.  Diagnosing physician: Ruta Hinds MD Electronically signed by Ruta Hinds MD on 11/08/2019 at 7:09:36 PM.    Final    [name] EKG SITE RITE  Result Date: 11/07/2019 If Site Rite image not attached, placement could not be confirmed due to current cardiac rhythm.     Subjective: Patient was seen and examined at bedside.  Overnight events noted.  Patient heart rate remains between 45-60.  Denies any chest pain, shortness of breath.  Patient is being discharged  on milrinone infusion, cleared from CHF team.  Home health team has been arranged to monitor infusion.  Discharge Exam: Vitals:   11/16/19 0054 11/16/19 0500  BP:  94/60  Pulse:  (!) 52  Resp:  16  Temp: 97.9 F (36.6 C) 97.7 F (36.5 C)  SpO2:  100%   Vitals:   11/15/19 1301 11/15/19 2039 11/16/19 0054 11/16/19 0500  BP: 117/78   94/60  Pulse: (!) 55   (!) 52  Resp: 19   16  Temp: 97.8 F (36.6 C) 97.8 F (36.6 C) 97.9 F (36.6 C) 97.7 F (36.5 C)  TempSrc: Oral Oral Oral Oral  SpO2: 100%   100%  Weight:      Height:        General: Pt is alert, awake, not in acute distress Cardiovascular: RRR, S1/S2 +, no rubs, no gallops Respiratory: CTA bilaterally, no wheezing, no rhonchi Abdominal: Soft, NT, ND, bowel sounds + Extremities: Right sided weakness.    The results of significant diagnostics from this hospitalization (including imaging, microbiology, ancillary and laboratory) are listed below for reference.     Microbiology: No results found for this or any previous visit (from the past 240 hour(s)).   Labs: BNP (last 3 results) Recent Labs    07/12/19 1913 11/04/19 1818  BNP 1,991.9* 2,992.4*   Basic Metabolic Panel: Recent Labs  Lab 11/11/19 0815 11/11/19 0815 11/12/19 0106 11/13/19 0046 11/14/19 0535 11/15/19 0441 11/16/19 0546  NA 132*   < > 133* 134*  135 135 137  K 3.8   < > 3.9 3.9 4.0 4.4 4.5  CL 95*   < > 97* 100 100 99 103  CO2 27   < > '25 23 27 25 26  ' GLUCOSE 113*   < > 160* 180* 134* 130* 119*  BUN 20   < > 25* 30* 30* 27* 28*  CREATININE 1.14   < > 1.16 1.33* 1.10 1.09 1.08  CALCIUM 8.8*   < > 9.2 8.6* 8.9 9.0 9.3  MG 1.9  --  2.4 2.1 2.1 2.4  --   PHOS 4.7*  --  3.8 3.6 3.4 3.5  --    < > = values in this interval not displayed.   Liver Function Tests: Recent Labs  Lab 11/13/19 0046 11/14/19 0535 11/15/19 0441  AST 89* 111* 100*  ALT 48* 61* 61*  ALKPHOS 185* 184* 191*  BILITOT 1.1 1.4* 1.3*  PROT 6.8 6.7 7.0  ALBUMIN  3.3* 3.2* 3.3*   No results for input(s): LIPASE, AMYLASE in the last 168 hours. No results for input(s): AMMONIA in the last 168 hours. CBC: Recent Labs  Lab 11/10/19 0450 11/10/19 0450 11/11/19 0500 11/11/19 0500 11/11/19 0808 11/12/19 0106 11/13/19 0046 11/14/19 0535 11/15/19 0441  WBC 5.9  --  6.5  --   --   --   --   --   --   NEUTROABS 4.4  --   --   --   --   --   --   --   --   HGB 11.1*   < > 11.6*   < > 13.3  13.6 12.4* 11.0* 10.8* 11.0*  HCT 33.1*   < > 34.3*   < > 39.0  40.0 36.7* 33.3* 32.4* 33.4*  MCV 93.2  --  92.0  --   --   --   --   --   --   PLT 200  --  203  --   --   --   --   --   --    < > = values in this interval not displayed.   Cardiac Enzymes: No results for input(s): CKTOTAL, CKMB, CKMBINDEX, TROPONINI in the last 168 hours. BNP: Invalid input(s): POCBNP CBG: Recent Labs  Lab 11/15/19 1659 11/15/19 1823 11/15/19 2107 11/16/19 0800 11/16/19 1115  GLUCAP 87 109* 97 102* 153*   D-Dimer No results for input(s): DDIMER in the last 72 hours. Hgb A1c No results for input(s): HGBA1C in the last 72 hours. Lipid Profile No results for input(s): CHOL, HDL, LDLCALC, TRIG, CHOLHDL, LDLDIRECT in the last 72 hours. Thyroid function studies No results for input(s): TSH, T4TOTAL, T3FREE, THYROIDAB in the last 72 hours.  Invalid input(s): FREET3 Anemia work up No results for input(s): VITAMINB12, FOLATE, FERRITIN, TIBC, IRON, RETICCTPCT in the last 72 hours. Urinalysis    Component Value Date/Time   LABSPEC <=1.005 11/17/2008 1539   PHURINE 6.0 11/17/2008 1539   GLUCOSEU NEGATIVE 11/17/2008 1539   HGBUR NEGATIVE 11/17/2008 1539   BILIRUBINUR NEGATIVE 11/17/2008 1539   KETONESUR NEGATIVE 11/17/2008 1539   PROTEINUR NEGATIVE 11/17/2008 1539   UROBILINOGEN 1.0 11/17/2008 1539   NITRITE NEGATIVE 11/17/2008 1539   LEUKOCYTESUR  11/17/2008 1539    NEGATIVE Biochemical Testing Only. Please order routine urinalysis from main lab if confirmatory  testing is needed.   Sepsis Labs Invalid input(s): PROCALCITONIN,  WBC,  LACTICIDVEN Microbiology No results found for this or any previous visit (from the  past 240 hour(s)).   Time coordinating discharge: Over 30 minutes  SIGNED:   Shawna Clamp, MD  [facility] 11/16/2019, 11:31 AM Pager   If 7PM-7AM, please contact night-coverage [email or web address]

## 2019-11-16 NOTE — Progress Notes (Signed)
Had spoken with family member and with [name]. Both wanted to have the foley cathter removed. Dr. Lucianne Muss updated with request to d/c foley cath. Ok to remove per MD Lucianne Muss  , with instruction to make sure pt voids before d/c.

## 2019-11-16 NOTE — Progress Notes (Addendum)
Patient ID: [name], male   DOB: September 03, 1955, 64 y.o.   MRN: [id number]     Advanced Heart Failure Rounding Note  PCP-Cardiologist: Verne Carrow, MD   Subjective:    Remains on milrinone 0.125 mcg. CO-OX 55%   Denies SOB. Wants to eat breakfast.  Paracentesis (10/31) with 4.8 L out. O2 sats 100% on RA.   Cardiac Studies  RHC (11/4): RHC Procedural Findings: Hemodynamics (mmHg) RA mean 7 RV 29/6 PA 29/9, mean 17 PCWP mean 8 Oxygen saturations: PA 57% AO 100% Cardiac Output (Thermo) 2.66 Cardiac Index (Thermo) 1.61 Cardiac Output (Fick) 3.38  Cardiac Index (Fick) 2.05  Cardiac MRI: 1. Normal LV size with normal wall thickness. EF 24%, diffuse hypokinesis worse in the septum. 2. Mildly dilated RV with mildly decreased systolic function, EF 42%. 3.  Severe biatrial enlargement. 4. Extensive LGE in a non-coronary distribution throughout much of the LV, also involving RV free wall and left and right atrial walls. ECV 42%. This is suggestive of infiltrative disease. The walls are not thick, making amyloidosis and Fabry's less likely. Myocarditis is a consideration.  PYP scan: Grade 2, H/CL 1.64.  Suggestive of transthyretin amyloidosis.    Objective:   Weight Range: 61.1 kg Body mass index is 21.1 kg/m.   Vital Signs:   Temp:  [97.7 F (36.5 C)-98.6 F (37 C)] 97.7 F (36.5 C) (11/09 0500) Pulse Rate:  [52-56] 52 (11/09 0500) Resp:  [16-20] 16 (11/09 0500) BP: (94-117)/(60-78) 94/60 (11/09 0500) SpO2:  [99 %-100 %] 100 % (11/09 0500) Last BM Date: 11/13/19  Weight change: Filed Weights   11/13/19 0451 11/14/19 0302 11/15/19 0402  Weight: 54.5 kg 63.2 kg 61.1 kg    Intake/Output:   Intake/Output Summary (Last 24 hours) at 11/16/2019 0714 Last data filed at 11/16/2019 0500 Gross per 24 hour  Intake --  Output 350 ml  Net -350 ml      Physical Exam   CVP 12-13  General:   No resp difficulty HEENT: normal Neck: supple. JVP 11-12.  Carotids 2+ bilat; no bruits. No lymphadenopathy or thryomegaly appreciated. Cor: PMI nondisplaced. Irregular rate & rhythm. No rubs, gallops or murmurs. Lungs: clear Abdomen: soft, nontender, nondistended. No hepatosplenomegaly. No bruits or masses. Good bowel sounds. Extremities: no cyanosis, clubbing, rash, edema. LUE PICC  Neuro: alert & orientedx3, cranial nerves grossly intact. moves all 4 extremities w/o difficulty. Affect pleasant   Telemetry   A fib 30-60s   Labs    CBC Recent Labs    11/14/19 0535 11/15/19 0441  HGB 10.8* 11.0*  HCT 32.4* 33.4*   Basic Metabolic Panel Recent Labs    35/36/14 0535 11/14/19 0535 11/15/19 0441 11/16/19 0546  NA 135   < > 135 137  K 4.0   < > 4.4 4.5  CL 100   < > 99 103  CO2 27   < > 25 26  GLUCOSE 134*   < > 130* 119*  BUN 30*   < > 27* 28*  CREATININE 1.10   < > 1.09 1.08  CALCIUM 8.9   < > 9.0 9.3  MG 2.1  --  2.4  --   PHOS 3.4  --  3.5  --    < > = values in this interval not displayed.   Liver Function Tests Recent Labs    11/14/19 0535 11/15/19 0441  AST 111* 100*  ALT 61* 61*  ALKPHOS 184* 191*  BILITOT 1.4* 1.3*  PROT 6.7 7.0  ALBUMIN 3.2* 3.3*   No results for input(s): LIPASE, AMYLASE in the last 72 hours. Cardiac Enzymes No results for input(s): CKTOTAL, CKMB, CKMBINDEX, TROPONINI in the last 72 hours.  BNP: BNP (last 3 results) Recent Labs    07/12/19 1913 11/04/19 1818  BNP 1,991.9* 1,651.0*    ProBNP (last 3 results) No results for input(s): PROBNP in the last 8760 hours.   D-Dimer No results for input(s): DDIMER in the last 72 hours. Hemoglobin A1C No results for input(s): HGBA1C in the last 72 hours. Fasting Lipid Panel No results for input(s): CHOL, HDL, LDLCALC, TRIG, CHOLHDL, LDLDIRECT in the last 72 hours. Thyroid Function Tests No results for input(s): TSH, T4TOTAL, T3FREE, THYROIDAB in the last 72 hours.  Invalid input(s): FREET3  Other results:   Imaging    No  results found.   Medications:     Scheduled Medications: . apixaban  5 mg Oral BID  . chlorhexidine  15 mL Mouth Rinse BID  . Chlorhexidine Gluconate Cloth  6 each Topical Daily  . insulin aspart  0-9 Units Subcutaneous TID WC  . lidocaine  1 application Urethral Once  . mouth rinse  15 mL Mouth Rinse q12n4p  . midodrine  2.5 mg Oral TID WC  . simvastatin  40 mg Oral Daily  . sodium chloride flush  10-40 mL Intracatheter Q12H  . sodium chloride flush  3 mL Intravenous Q12H  . spironolactone  12.5 mg Oral Daily  . torsemide  20 mg Oral Daily    Infusions: . milrinone 0.125 mcg/kg/min (11/15/19 1555)    PRN Medications: acetaminophen **OR** acetaminophen, alum & mag hydroxide-simeth, lidocaine, loratadine, ondansetron **OR** ondansetron (ZOFRAN) IV, oxyCODONE, polyethylene glycol, sodium chloride flush   Assessment/Plan   1. Acute on chronic systolic CHF: Nonischemic cardiomyopathy known since 2018.  Improved initially in 2018 with DCCV, but has been back in atrial fibrillation since 2019 it appears and EF has been low.  I reviewed echo this admission, EF 15-20% with mod-severe RV dilation and moderate RV dysfunction, D-shaped septum, dilated IVC, severe biatrial enlargement. Significant RV failure with ascites on CT abdomen, had paracentesis.  Cardiac MRI showed LV EF 24%, RV mildly dilated with EF 42%, extensive LGE in a non-coronary distribution throughout much of the LV, also involving RV free wall and left and right atrial walls, ECV 42%. This is suggestive of infiltrative disease. 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  He was initially on milrinone for low output HF, this was weaned off for DCCV which he failed. RHC 11/4 off milrinone showed optimized filling pressures but low cardiac output, milrinone restarted  at 0.125.  - Remains on milrinone 0.125 mcg. CO-OX 68%.     - NO dig with bradycardia.  - Continue spironolactone 12.5 daily  - BP too soft for ARB/ARNi -CVP 12-13. Increased torsemide to 40 mg daily.     - Continue midodrine 2.5 mg tid for BP support  - Narrow QRS, not CRT candidate.  - Not  a good candidate for VAD. Patient assessed by our LVAD nurse, she had significant concerns about his right arm/hand weakness.  He would be unable to manage his LVAD equipment and would need consistent 24 hr caregiver (not available).  Discharging with Hospice.  2. Atrial fibrillation: Now chronic.  Initially in 2018, EF improved after DCCV.  However, as  above, he is not in RVR so do not think that cardiomyopathy is primarily due to AF.  He failed DCCV on amiodarone this admission, amiodarone due stopped due to bradycardia.  - Continue Eliquis  3. Ascites: Large ascites on CT.  Suspect due to RV failure. Paracentesis on 10/31 with 4.8 L out. O2 sats stable on RA  4. H/o CVA: In 2010, with expressive aphasia and right-sided weakness. Per niece, he is at baseline.  - PT/OT.  5. Hyponatremia: Resolved with 1 dose tolvaptan. Na 137 today.  6. GOC- DNR Palliative Care following   Plan to d/c home with [facility].   Home meds for d/c Milrinone 0.25 mcg-->[facility].  Torsemide 40 mg daily  Spironolactone 12.5 mg daily  Midodrine 2.5 mg three times a day  Eliquis 5 mg twice a day  Simvastatin 40 mg daily   Length of Stay: 11  Amy Clegg, NP  11/16/2019, 7:14 AM  Advanced Heart Failure Team Pager [phone] (M-F; 7a - 4p)  Please contact [facility] for night-coverage after hours (4p -7a ) and weekends on [email or web address]  Patient seen with NP, agree with the above note.   Stable this morning but CVP higher at 12.  Co-ox 55%.  HR 40s-50s atrial fibrillation.  Creatinine stable 1.07. No complaints.   General: NAD Neck: JVP 10 cm, no thyromegaly or thyroid nodule.  Lungs: Clear to  auscultation bilaterally with normal respiratory effort. CV: Nondisplaced PMI.  Heart irregular S1/S2, no S3/S4, no murmur.  No peripheral edema.   Abdomen: Soft, nontender, no hepatosplenomegaly, no distention.  Skin: Intact without lesions or rashes.  Neurologic: Alert and oriented x 3.  Psych: Normal affect. Extremities: No clubbing or cyanosis.  HEENT: Normal.   Patient planned for home hospice with milrinone infusion.   With marginal co-ox this morning, will increase milrinone for home to 0.25 mcg/kg/min.  This may also help HR (stable in 40s-50s with stable BP).   With CVP creeping up to 12, will increase torsemide to 40 mg daily.    Home on the above medications, followup in CHF clinic.   Marca Ancona 11/16/2019 7:43 AM

## 2019-11-16 NOTE — Progress Notes (Signed)
OT Cancellation Note  Patient Details Name: [name] MRN: [id number] DOB: February 09, 1955   Cancelled Treatment:    Reason Eval/Treat Not Completed: Other (comment) (Pt to d/c home with hospice services. OT d/c at this time.)   Flora Lipps, OTR/L Acute Rehabilitation Services Pager: [phone] Office: [phone]   ALLISONC 11/16/2019, 4:51 PM

## 2019-11-16 NOTE — TOC Transition Note (Signed)
Transition of Care [facility]) - CM/SW Discharge Note   Patient Details  Name: [name] MRN: [id number] Date of Birth: October 17, 1955  Transition of Care [facility] CM/SW Contact:  Gala Lewandowsky, RN Phone Number: 11/16/2019, 12:41 PM   Clinical Narrative: Risk for readmission assessment completed. Patient will transition home with Hospice services later today once he gets connected to Milrinone. Family will transport home via private vehicle.    Final next level of care: Home w Hospice Care Barriers to Discharge: No Barriers Identified   Patient Goals and CMS Choice Patient states their goals for this hospitalization and ongoing recovery are:: to return home with hospice services. CMS Medicare.gov Compare Post Acute Care list provided to:: Patient Represenative (must comment) [name] Brother) Choice offered to / list presented to :  (NP with Palliative Care spoke with family regarding which hospice agency can accept patient's with Milrinone. Family is agreeable to use [facility].)  Discharge Plan and Services In-house Referral: NA Discharge Planning Services: CM Consult Post Acute Care Choice: Hospice          DME Arranged:  (hospice agency to discuss with family- recommendations suggested were Bedside commode and Wheelchair.) DME Agency: NA       HH Arranged: RN HH Agency: [facility] (Hospice Services) Date [facility] Agency Contacted: 11/15/19 Time HH Agency Contacted: 1300 Representative spoke with at [facility] Agency: Tim  Readmission Risk Interventions Readmission Risk Prevention Plan 11/16/2019  Transportation Screening Complete  PCP or Specialist Appt within 3-5 Days Complete  HRI or Home Care Consult Complete  Social Work Consult for Recovery Care Planning/Counseling Complete  Palliative Care Screening Complete  Medication Review Oceanographer) Complete  Some recent data might be hidden

## 2019-11-16 NOTE — Progress Notes (Addendum)
Pt has had intermittent pauses throughout the night w/HR dropping <30 non-sustained. Pt is asymptomatic. Strips saved per CCMD. Dierdre Highman, RN   Pt also had 10bts NSVT at 0330-strip saved. Dierdre Highman, RN

## 2019-11-16 NOTE — Discharge Instructions (Signed)
Advised to follow-up with heart failure team at [facility]. Advised to follow-up with Amy Clegg in 1 week. Patient has been discharged on milrinone infusion. Patient is going home with home hospice.

## 2019-11-17 ENCOUNTER — Ambulatory Visit: Payer: Medicaid Other | Admitting: Podiatry

## 2019-11-24 ENCOUNTER — Encounter (HOSPITAL_COMMUNITY): Payer: Self-pay

## 2019-11-24 ENCOUNTER — Ambulatory Visit (HOSPITAL_COMMUNITY)
Admission: RE | Admit: 2019-11-24 | Discharge: 2019-11-24 | Disposition: A | Discharge status: Home / Self Care | Payer: Medicaid Other | Source: Ambulatory Visit | Attending: Cardiology | Admitting: Cardiology

## 2019-11-24 ENCOUNTER — Other Ambulatory Visit: Payer: Self-pay

## 2019-11-24 VITALS — BP 102/70 | HR 63 | Wt 139.2 lb

## 2019-11-24 DIAGNOSIS — I420 Dilated cardiomyopathy: Secondary | ICD-10-CM | POA: Diagnosis not present

## 2019-11-24 DIAGNOSIS — I5022 Chronic systolic (congestive) heart failure: Secondary | ICD-10-CM | POA: Diagnosis not present

## 2019-11-24 DIAGNOSIS — I110 Hypertensive heart disease with heart failure: Secondary | ICD-10-CM | POA: Insufficient documentation

## 2019-11-24 DIAGNOSIS — Z7901 Long term (current) use of anticoagulants: Secondary | ICD-10-CM | POA: Diagnosis not present

## 2019-11-24 DIAGNOSIS — R188 Other ascites: Secondary | ICD-10-CM | POA: Diagnosis not present

## 2019-11-24 DIAGNOSIS — I509 Heart failure, unspecified: Secondary | ICD-10-CM

## 2019-11-24 DIAGNOSIS — R531 Weakness: Secondary | ICD-10-CM | POA: Diagnosis not present

## 2019-11-24 DIAGNOSIS — Z7982 Long term (current) use of aspirin: Secondary | ICD-10-CM | POA: Diagnosis not present

## 2019-11-24 DIAGNOSIS — Z79899 Other long term (current) drug therapy: Secondary | ICD-10-CM | POA: Diagnosis not present

## 2019-11-24 DIAGNOSIS — Z8249 Family history of ischemic heart disease and other diseases of the circulatory system: Secondary | ICD-10-CM | POA: Insufficient documentation

## 2019-11-24 DIAGNOSIS — I482 Chronic atrial fibrillation, unspecified: Secondary | ICD-10-CM | POA: Diagnosis not present

## 2019-11-24 DIAGNOSIS — I428 Other cardiomyopathies: Secondary | ICD-10-CM | POA: Diagnosis not present

## 2019-11-24 DIAGNOSIS — I69320 Aphasia following cerebral infarction: Secondary | ICD-10-CM | POA: Diagnosis not present

## 2019-11-24 LAB — BASIC METABOLIC PANEL
Anion gap: 11 (ref 5–15)
BUN: 20 mg/dL (ref 8–23)
CO2: 25 mmol/L (ref 22–32)
Calcium: 9.5 mg/dL (ref 8.9–10.3)
Chloride: 100 mmol/L (ref 98–111)
Creatinine, Ser: 1.34 mg/dL — ABNORMAL HIGH (ref 0.61–1.24)
GFR, Estimated: 59 mL/min — ABNORMAL LOW (ref >60)
Glucose, Bld: 127 mg/dL — ABNORMAL HIGH (ref 70–99)
Potassium: 4.4 mmol/L (ref 3.5–5.1)
Sodium: 136 mmol/L (ref 135–145)

## 2019-11-24 LAB — BRAIN NATRIURETIC PEPTIDE: B Natriuretic Peptide: 1200.4 pg/mL — ABNORMAL HIGH (ref 0.0–100.0)

## 2019-11-24 MED ORDER — SPIRONOLACTONE 25 MG PO TABS
25.0000 mg | ORAL_TABLET | Freq: Every day | ORAL | 3 refills | Status: DC
Start: 2019-11-24 — End: 2020-03-13

## 2019-11-24 NOTE — Patient Instructions (Signed)
INCREASE Spironolactone to 25 mg, one tab daily  Labs today We will only contact you if something comes back abnormal or we need to make some changes. Otherwise no news is good news!  Your physician recommends that you schedule a follow-up appointment in: 6 weeks  in the Advanced Practitioners (PA/NP) Clinic    If you have any questions or concerns before your next appointment please send [name] a message through [place] or call our office at [phone].    TO LEAVE A MESSAGE FOR THE NURSE SELECT OPTION 2, PLEASE LEAVE A MESSAGE INCLUDING: . YOUR NAME . DATE OF BIRTH . CALL BACK NUMBER . REASON FOR CALL**this is important as we prioritize the call backs  YOU WILL RECEIVE A CALL BACK THE SAME DAY AS LONG AS YOU CALL BEFORE 4:00 PM

## 2019-11-24 NOTE — Progress Notes (Addendum)
Advanced Heart Failure Clinic Note   Referring Physician: PCP: Mirna Mires, MD PCP-Cardiologist: Verne Carrow, MD  [facility]: Dr. Shirlee Latch    HPI:  64 y.o. with history of chronic atrial fibrillation and nonischemic cardiomyopathy, recently admitted 10/21 to [facility] for CHF.  Here today for post hospital f/u.   Patient had CVA in 2010, since that time has had expressive aphasia and right-sided weakness.  He lives with his brother.  In 8/18, he was admited with new atrial flutter and EF was found to be 20%.  He had cath in 8/18 showing mild nonobstructive coronary disease.  In 10/18, he had DCCV to NSR.  In 11/18 while in NSR, echo showed EF up to 45-50%.  By 2019, he was back in atrial fibrillation.  He was not cardioverted again. Echo in 4/21 showed EF 20-25%.    Recently presented to hospital w/ 2 week h/o increased dyspnea with exertion and developed abdominal swelling.  Failed outpatient diuretic titration. Admitted for IV diuretics. Echo repeated and EF was 20% with mod-severe RV dilation and moderate RV dysfunction, D-shaped septum, dilated IVC, severe biatrial enlargement.  He was in atrial fibrillation in 50s-60s. CT of abdomen showed large volume ascites, had paracentesis with 4.8 L out.  Cardiac MRI showed LV EF 24%, RV mildly dilated with EF 42%, extensive LGE in a non-coronary distribution throughout much of the LV, also involving RV free wall and left and right atrial walls, ECV 42%. This is suggestive of infiltrative disease. 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  He was initially on milrinone for low output HF, this was weaned off for DCCV which he failed. RHC 11/4 off milrinone showed optimized filling pressures but low cardiac output, milrinone restarted at 0.125. Discharged w/ home milrinone + home health.   He  presents to clinic today for f/u. Here w/ his brother, whom he resides with. Doing well post discharge, NYHA Class II-III. Wt up 4 lb from hospital d/c wt, from 134>>138 lb. ReDs Clip 35%. No peripheral edema. SBP in the low 100s systolic but denies orthostatic symptoms. HR controlled. No palpitations. No issues w/ PICC line site. Home health sees every Tuesday.   Review of systems complete and found to be negative unless listed in HPI.     Past Medical History:  Diagnosis Date  . Atrial flutter (HCC)   . CHF (congestive heart failure) (HCC)   . Diabetes mellitus   . Hypertension   . 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 Stroke [facility])     Current Outpatient Medications  Medication Sig Dispense Refill  . Accu-Chek FastClix Lancets MISC 1 each by Other route as directed.    Marland Kitchen ACCU-CHEK GUIDE test strip 1 each by Other route See admin instructions.    Marland Kitchen acetaminophen (TYLENOL) 500 MG tablet Take 1,000 mg by mouth every 6 (six) hours as needed for pain.    Marland Kitchen apixaban (ELIQUIS) 5 MG TABS tablet Take 1 tablet (5 mg total) by mouth 2 (two) times daily. 60 tablet 10  . aspirin EC 81 MG EC tablet Take 1 tablet (81 mg total) by mouth daily.    . midodrine (PROAMATINE) 2.5 MG tablet Take 1 tablet (2.5 mg total) by mouth 3 (three) times daily with meals. 90  tablet 1  . milrinone (PRIMACOR) 20 MG/100 ML SOLN infusion Inject 0.0139 mg/min into the vein continuous. 100 mL 0  . Multiple Vitamin (MULTIVITAMIN WITH MINERALS) TABS Take 1 tablet by mouth daily.    Marland Kitchen omega-3 acid ethyl esters (LOVAZA) 1 G capsule Take 1 g by mouth daily.    . simvastatin (ZOCOR) 40 MG tablet Take 40 mg by mouth daily.    Marland Kitchen spironolactone (ALDACTONE) 25 MG tablet Take 0.5 tablets (12.5 mg total) by mouth daily. 30 tablet 0  . torsemide (DEMADEX) 20 MG tablet Take 2 tablets (40 mg total) by mouth daily. 30 tablet 1   No current  facility-administered medications for this encounter.    No Known Allergies    Social History   Socioeconomic History  . Marital status: Single    Spouse name: Not on file  . Number of children: Not on file  . Years of education: Not on file  . Highest education level: Not on file  Occupational History  . Not on file  Tobacco Use  . Smoking status: Never Smoker  . Smokeless tobacco: Never Used  Vaping Use  . Vaping Use: Never used  Substance and Sexual Activity  . Alcohol use: No  . Drug use: No  . Sexual activity: Not Currently  Other Topics Concern  . Not on file  Social History Narrative  . Not on file   Social Determinants of Health   Financial Resource Strain:   . Difficulty of Paying Living Expenses: Not on file  Food Insecurity:   . Worried About Programme researcher, broadcasting/film/video in the Last Year: Not on file  . Ran Out of Food in the Last Year: Not on file  Transportation Needs:   . Lack of Transportation (Medical): Not on file  . Lack of Transportation (Non-Medical): Not on file  Physical Activity:   . Days of Exercise per Week: Not on file  . Minutes of Exercise per Session: Not on file  Stress:   . Feeling of Stress : Not on file  Social Connections:   . Frequency of Communication with Friends and Family: Not on file  . Frequency of Social Gatherings with Friends and Family: Not on file  . Attends Religious Services: Not on file  . Active Member of Clubs or Organizations: Not on file  . Attends Banker Meetings: Not on file  . Marital Status: Not on file  Intimate Partner Violence:   . Fear of Current or Ex-Partner: Not on file  . Emotionally Abused: Not on file  . Physically Abused: Not on file  . Sexually Abused: Not on file      Family History  Problem Relation Age of Onset  . Hypertension Mother   . Diabetes Mother   . Heart disease Father     Vitals:   11/24/19 1137  BP: 102/70  Pulse: 63  SpO2: 99%  Weight: 63.1 kg      PHYSICAL EXAM: ReDS Clip 35% General:  Well appearing, thin middle aged AAM. No respiratory difficulty HEENT: normal Neck: supple. no JVD. Carotids 2+ bilat; no bruits. No lymphadenopathy or thyromegaly appreciated. Cor: PMI nondisplaced. Irregularly irregular rhythm and rate. No rubs, gallops or murmurs. Lungs: clear Abdomen: soft, nontender, nondistended. No hepatosplenomegaly. No bruits or masses. Good bowel sounds. Extremities: no cyanosis, clubbing, rash, edema + LUE PICC  Neuro: alert & oriented x 3, cranial nerves grossly intact. moves all 4 extremities w/o difficulty. Affect pleasant.  ECG: not  performed   ASSESSMENT & PLAN:  1. Chronic systolic CHF: Nonischemic cardiomyopathy known since 2018. Improved initially in 2018 with DCCV, but has been back in atrial fibrillation since 2019 it appears and EF has been low. Recent admit 10/21 for a/c HF w/ low output requiring milrinone. Echo showed EF 15-20% with mod-severe RV dilation and moderate RV dysfunction, D-shaped septum, dilated IVC, severe biatrial enlargement. Significant RV failure with ascites on CT abdomen, had paracentesis.  Cardiac MRI showed LV EF 24%, RV mildly dilated with EF 42%, extensive LGE in a non-coronary distribution throughout much of the LV, also involving RV free wall and left and right atrial walls, ECV 42%. This is suggestive of infiltrative disease. 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  He was initially on milrinone for low output HF, this was weaned off for DCCV which he failed. RHC 11/4 off milrinone showed optimized filling pressures but low cardiac output, milrinone restarted at 0.125.  - Remains on home milrinone 0.125 mcg. Home health following. PICC line ok.     - NYHA Class II-III. Wt up slightly since hospital d/c. Up 4 lb. ReDS  clip 35%. BP soft, low 100s systolic - Continue torsemide 40 mg daily  - Increase Spiro to 25 mg daily  - BP too soft for ARB/ARNi - NO dig or  blocker with bradycardia + recent low output - Continue midodrine 2.5 mg tid for BP support  - Narrow QRS, not CRT candidate.  - Not  a good candidate for VAD. Patient assessed by our LVAD nurse, she had significant concerns about his right arm/hand weakness. He would be unable to manage his LVAD equipment and would need consistent 24 hr caregiver (not available).  - check BMP today w/ spiro titration. HH to recheck again next week.  2. Atrial fibrillation: Now chronic. Initially in 2018, EF improved after DCCV. However, as above, he has been relatively well rate controlled and has not been in RVR, so do not think that cardiomyopathy is primarily due to AF. He failed DCCV on amiodarone 10/21, amiodarone due stopped due to bradycardia.  - Continue Eliquis  3. Ascites:Suspect due to RV failure. S/p Paracentesis on 10/31 with 4.8 L out. No evidence of recurrence on exam today  4. H/o CVA: In 2010, with expressive aphasia and right-sided weakness.    F/u in 6 weeks.    Robbie Lis, PA-C 11/24/19

## 2019-11-24 NOTE — Progress Notes (Signed)
Station C Ruler 29.5 Reading 35%

## 2019-11-24 NOTE — Addendum Note (Signed)
Encounter addended by: Theresia Bough, CMA on: 11/24/2019 1:07 PM  Actions taken: Pend clinical note, Clinical Note Signed

## 2019-12-20 ENCOUNTER — Emergency Department: Payer: Self-pay

## 2019-12-20 ENCOUNTER — Emergency Department (HOSPITAL_COMMUNITY): Payer: Medicaid Other

## 2019-12-20 ENCOUNTER — Emergency Department (HOSPITAL_COMMUNITY)
Admission: EM | Admit: 2019-12-20 | Discharge: 2019-12-20 | Disposition: A | Discharge status: Home / Self Care | Payer: Medicaid Other | Attending: Emergency Medicine | Admitting: Emergency Medicine

## 2019-12-20 ENCOUNTER — Other Ambulatory Visit: Payer: Self-pay

## 2019-12-20 DIAGNOSIS — I5023 Acute on chronic systolic (congestive) heart failure: Secondary | ICD-10-CM | POA: Diagnosis not present

## 2019-12-20 DIAGNOSIS — T82898A Other specified complication of vascular prosthetic devices, implants and grafts, initial encounter: Secondary | ICD-10-CM | POA: Insufficient documentation

## 2019-12-20 DIAGNOSIS — E871 Hypo-osmolality and hyponatremia: Secondary | ICD-10-CM | POA: Insufficient documentation

## 2019-12-20 DIAGNOSIS — X58XXXA Exposure to other specified factors, initial encounter: Secondary | ICD-10-CM | POA: Insufficient documentation

## 2019-12-20 DIAGNOSIS — Z79899 Other long term (current) drug therapy: Secondary | ICD-10-CM | POA: Insufficient documentation

## 2019-12-20 DIAGNOSIS — Z8673 Personal history of transient ischemic attack (TIA), and cerebral infarction without residual deficits: Secondary | ICD-10-CM | POA: Diagnosis not present

## 2019-12-20 DIAGNOSIS — Z8249 Family history of ischemic heart disease and other diseases of the circulatory system: Secondary | ICD-10-CM | POA: Diagnosis not present

## 2019-12-20 DIAGNOSIS — I4892 Unspecified atrial flutter: Secondary | ICD-10-CM | POA: Insufficient documentation

## 2019-12-20 DIAGNOSIS — I110 Hypertensive heart disease with heart failure: Secondary | ICD-10-CM | POA: Insufficient documentation

## 2019-12-20 DIAGNOSIS — Z789 Other specified health status: Secondary | ICD-10-CM

## 2019-12-20 DIAGNOSIS — Z833 Family history of diabetes mellitus: Secondary | ICD-10-CM | POA: Diagnosis not present

## 2019-12-20 DIAGNOSIS — Z7982 Long term (current) use of aspirin: Secondary | ICD-10-CM | POA: Insufficient documentation

## 2019-12-20 DIAGNOSIS — Z95828 Presence of other vascular implants and grafts: Secondary | ICD-10-CM

## 2019-12-20 DIAGNOSIS — Z452 Encounter for adjustment and management of vascular access device: Secondary | ICD-10-CM

## 2019-12-20 DIAGNOSIS — I428 Other cardiomyopathies: Secondary | ICD-10-CM | POA: Diagnosis not present

## 2019-12-20 DIAGNOSIS — I2510 Atherosclerotic heart disease of native coronary artery without angina pectoris: Secondary | ICD-10-CM | POA: Insufficient documentation

## 2019-12-20 DIAGNOSIS — I509 Heart failure, unspecified: Secondary | ICD-10-CM

## 2019-12-20 DIAGNOSIS — I4821 Permanent atrial fibrillation: Secondary | ICD-10-CM | POA: Diagnosis not present

## 2019-12-20 DIAGNOSIS — E119 Type 2 diabetes mellitus without complications: Secondary | ICD-10-CM | POA: Insufficient documentation

## 2019-12-20 MED ORDER — GLIMEPIRIDE 1 MG PO TABS
1.0000 mg | ORAL_TABLET | Freq: Every day | ORAL | Status: DC
  Administered 2019-12-20: 11:00:00 1 mg via ORAL
  Filled 2019-12-20: qty 1

## 2019-12-20 MED ORDER — SODIUM CHLORIDE 0.9% FLUSH: 10.0000 mL | INTRAVENOUS | Status: DC | PRN

## 2019-12-20 MED ORDER — SODIUM CHLORIDE 0.9% FLUSH: 10.0000 mL | Freq: Two times a day (BID) | INTRAVENOUS | Status: DC

## 2019-12-20 MED ORDER — METFORMIN HCL 500 MG PO TABS
1000.0000 mg | ORAL_TABLET | Freq: Two times a day (BID) | ORAL | Status: DC
  Administered 2019-12-20: 11:00:00 1000 mg via ORAL
  Filled 2019-12-20: qty 2

## 2019-12-20 MED ORDER — MILRINONE LACTATE IN DEXTROSE 20-5 MG/100ML-% IV SOLN
0.2500 ug/kg/min | INTRAVENOUS | Status: DC
  Administered 2019-12-20: 03:00:00 0.25 ug/kg/min via INTRAVENOUS
  Filled 2019-12-20: qty 100

## 2019-12-20 MED ORDER — CHLORHEXIDINE GLUCONATE CLOTH 2 % EX PADS: 6.0000 | MEDICATED_PAD | Freq: Every day | CUTANEOUS | Status: DC

## 2019-12-20 NOTE — Discharge Planning (Signed)
RNCM contacted home infusion RN Jeri Modena) regarding pt needing to be restarted on home Rx.  Pam states she will come to ED to visit pt within the hour.

## 2019-12-20 NOTE — ED Notes (Signed)
CM contacted HH to arrange for medications to be restarted on new PICC.

## 2019-12-20 NOTE — Progress Notes (Signed)
Peripherally Inserted Central Catheter Placement  The IV Nurse has discussed with the patient and/or persons authorized to consent for the patient, the purpose of this procedure and the potential benefits and risks involved with this procedure.  The benefits include less needle sticks, lab draws from the catheter, and the patient may be discharged home with the catheter. Risks include, but not limited to, infection, bleeding, blood clot (thrombus formation), and puncture of an artery; nerve damage and irregular heartbeat and possibility to perform a PICC exchange if needed/ordered by physician.  Alternatives to this procedure were also discussed.  Bard Power PICC patient education guide, fact sheet on infection prevention and patient information card has been provided to patient /or left at bedside.    PICC Placement Documentation  PICC Double Lumen 12/20/19 PICC Left Brachial 40 cm 0 cm (Active)  Exposed Catheter (cm) 0 cm 12/20/19 1249  Site Assessment Clean;Dry;Intact 12/20/19 1249  Lumen #1 Status Saline locked;Blood return noted 12/20/19 1249  Lumen #2 Status Blood return noted;Saline locked 12/20/19 1249  Dressing Type Transparent;Securing device 12/20/19 1249  Dressing Status Dry;Clean;Intact 12/20/19 1249  Antimicrobial disc in place? Yes 12/20/19 1249  Safety Lock Not Applicable 12/20/19 1249  Dressing Change Due 12/27/19 12/20/19 1249       Romie Jumper 12/20/2019, 12:53 PM

## 2019-12-20 NOTE — ED Notes (Signed)
HH connected new tubing and started medication.   Patient ready for DC

## 2019-12-20 NOTE — ED Provider Notes (Addendum)
Patient received at shift change from [facility].  Please see his note for full HPI  In short, 64 year old male with a prior stroke, CHF chronically on a milrinone infusion who accidentally removed his PICC line reportedly   Prior provider had spoken with cardiology who had instructed to restart the milrinone through peripheral IV.  Received care pending IV team insertion of PICC line.  I was informed by nursing staff that the IV team was very busy and was not able to get to the patient.  They requested IR placement.  Nursing staff contacted IR, and they requested an order was placed.  IR placed the PICC line, confirmed by chest x-ray.  Nursing staff contacting patient's home health nurse to meet the patient at home to restart his milrinone as we do not have the proper equipment here.  Home health team to come to ER to get patient started on drip. Stable for discharge.  Physical Exam  BP 103/70   Pulse (!) 52   Temp 98.6 F (37 C) (Oral)   Resp 19   Ht 5\' 7"  (1.702 m)   Wt 59 kg   SpO2 100%   BMI 20.36 kg/m   Physical Exam Vitals reviewed.  Constitutional:      Appearance: Normal appearance.  HENT:     Head: Normocephalic and atraumatic.  Eyes:     General:        Right eye: No discharge.        Left eye: No discharge.     Extraocular Movements: Extraocular movements intact.     Conjunctiva/sclera: Conjunctivae normal.  Musculoskeletal:        General: No swelling. Normal range of motion.  Skin:    General: Skin is warm.     Findings: No erythema or rash.     Comments: Picc Line in place in the left arm   Neurological:     General: No focal deficit present.     Mental Status: He is alert and oriented to person, place, and time.  Psychiatric:        Mood and Affect: Mood normal.        Behavior: Behavior normal.     ED Course/Procedures     Procedures  MDM        , PA-C 12/20/19 1459    12/22/19, MD 12/21/19 [phone]

## 2019-12-20 NOTE — ED Notes (Signed)
PICC team in room

## 2019-12-20 NOTE — ED Triage Notes (Signed)
Pt nonveral. Call received from home care agency patients picc line has come out. PT on continuous milrinone drip. No active distress noted.

## 2019-12-20 NOTE — ED Notes (Signed)
Patient discharged with family  Son with patient  DC instructions reviewed  PIV removed

## 2019-12-20 NOTE — ED Provider Notes (Signed)
[facility] EMERGENCY DEPARTMENT Provider Note   CSN: [id number] Arrival date & time: 12/20/19  0131     History Chief Complaint  Patient presents with  . Vascular Access Problem    Tyquavious Dupuis is a 64 y.o. male.  Patient with past medical history of prior stroke, CHF, hypertension, diabetes, and a flutter presents to the emergency department with a chief complaint of having accidentally removed his PICC line.  Uncertain how exactly it came out, but he indicates that it was accidental.  He is on a continuous milrinone infusion.  He is accompanied by family member/caregiver who states that he has not been complaining of any other symptoms.  Patient is mostly nonverbal secondary to previous stroke.  The history is provided by the patient. No language interpreter was used.       Past Medical History:  Diagnosis Date  . Atrial flutter (HCC)   . CHF (congestive heart failure) (HCC)   . Diabetes mellitus   . Hypertension   . 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 Stroke [facility])     Patient Active Problem List   Diagnosis Date Noted  . Goals of care, counseling/discussion   . Encounter for hospice care discussion   . Palliative care by specialist   . Cardiogenic shock (HCC) 11/05/2019  . Hyponatremia 11/05/2019  . Hypothermia 11/05/2019  . Acute on chronic systolic CHF (congestive heart failure) (HCC) 11/04/2019  . NICM (nonischemic cardiomyopathy) (HCC) 07/16/2019  . Permanent atrial fibrillation (HCC)   . Acute systolic CHF (congestive heart failure) (HCC) 08/01/2017  . Acute CHF (congestive heart failure) (HCC) 08/01/2017  . Acute on chronic HFrEF (heart failure with reduced ejection fraction) (HCC)   . Persistent atrial fibrillation (HCC)   . Essential hypertension 07/15/2017  . Type 2 diabetes mellitus without complication, without long-term current use  of insulin (HCC) 09/09/2016  . Acute systolic (congestive) heart failure (HCC) 09/07/2016  . Atrial flutter (HCC) 09/04/2016  . Cardiomyopathy (HCC) 06/02/2008  . ABNORMAL EKG 05/02/2008  . DIABETES MELLITUS, TYPE II 04/28/2008  . HLD (hyperlipidemia) 04/28/2008  . MIGRAINE HEADACHE 04/28/2008  . History of stroke 04/28/2008    Past Surgical History:  Procedure Laterality Date  . CARDIOVERSION N/A 10/10/2016   Procedure: CARDIOVERSION;  Surgeon: Lewayne Bunting, MD;  Location: [facility] ENDOSCOPY;  Service: Cardiovascular;  Laterality: N/A;  . CARDIOVERSION N/A 11/10/2019   Procedure: CARDIOVERSION;  Surgeon: Laurey Morale, MD;  Location: [facility] ENDOSCOPY;  Service: Cardiovascular;  Laterality: N/A;  . RIGHT HEART CATH N/A 11/11/2019   Procedure: RIGHT HEART CATH;  Surgeon: Laurey Morale, MD;  Location: [facility] INVASIVE CV LAB;  Service: Cardiovascular;  Laterality: N/A;  . RIGHT/LEFT HEART CATH AND CORONARY ANGIOGRAPHY N/A 09/06/2016   Procedure: RIGHT/LEFT HEART CATH AND CORONARY ANGIOGRAPHY;  Surgeon: Lennette Bihari, MD;  Location: [facility] INVASIVE CV LAB;  Service: Cardiovascular;  Laterality: N/A;  . TRACHEOSTOMY         Family History  Problem Relation Age of Onset  . Hypertension Mother   . Diabetes Mother   . Heart disease Father     Social History   Tobacco Use  . Smoking status: Never Smoker  . Smokeless tobacco: Never Used  Vaping Use  . Vaping Use: Never used  Substance Use Topics  . Alcohol use: No  . Drug use: No    Home Medications Prior to Admission  medications   Medication Sig Start Date End Date Taking? Authorizing Provider  Accu-Chek FastClix Lancets MISC 1 each by Other route as directed. 08/19/19   [provider]  ACCU-CHEK GUIDE test strip 1 each by Other route See admin instructions. 09/16/19   [provider]  acetaminophen (TYLENOL) 500 MG tablet Take 1,000 mg by mouth every 6 (six) hours as needed for pain.    [provider]   apixaban (ELIQUIS) 5 MG TABS tablet Take 1 tablet (5 mg total) by mouth 2 (two) times daily. 03/15/19   Kathleene Hazel, MD  aspirin EC 81 MG EC tablet Take 1 tablet (81 mg total) by mouth daily. 09/09/16   Leone Brand, NP  midodrine (PROAMATINE) 2.5 MG tablet Take 1 tablet (2.5 mg total) by mouth 3 (three) times daily with meals. 11/16/19   Cipriano Bunker, MD  milrinone (PRIMACOR) 20 MG/100 ML SOLN infusion Inject 0.0139 mg/min into the vein continuous. 11/16/19   Cipriano Bunker, MD  Multiple Vitamin (MULTIVITAMIN WITH MINERALS) TABS Take 1 tablet by mouth daily.    [provider]  omega-3 acid ethyl esters (LOVAZA) 1 G capsule Take 1 g by mouth daily.    [provider]  simvastatin (ZOCOR) 40 MG tablet Take 40 mg by mouth daily.    [provider]  spironolactone (ALDACTONE) 25 MG tablet Take 1 tablet (25 mg total) by mouth daily. 11/24/19   Robbie Lis M, PA-C  torsemide (DEMADEX) 20 MG tablet Take 2 tablets (40 mg total) by mouth daily. 11/17/19   Cipriano Bunker, MD    Allergies    Patient has no known allergies.  Review of Systems   Review of Systems  All other systems reviewed and are negative.   Physical Exam Updated Vital Signs BP 110/90 (BP Location: Right Arm)   Pulse 60   Resp 18   Ht 5\' 7"  (1.702 m)   Wt 59 kg   SpO2 100%   BMI 20.36 kg/m   Physical Exam Vitals and nursing note reviewed.  Constitutional:      Appearance: He is well-developed and well-nourished.  HENT:     Head: Normocephalic and atraumatic.  Eyes:     Conjunctiva/sclera: Conjunctivae normal.  Cardiovascular:     Rate and Rhythm: Normal rate and regular rhythm.     Heart sounds: No murmur heard.   Pulmonary:     Effort: Pulmonary effort is normal. No respiratory distress.     Breath sounds: Normal breath sounds.  Abdominal:     Palpations: Abdomen is soft.     Tenderness: There is no abdominal tenderness.  Musculoskeletal:        General: No  edema. Normal range of motion.     Cervical back: Neck supple.     Right lower leg: No edema.     Left lower leg: No edema.  Skin:    General: Skin is warm and dry.  Neurological:     Mental Status: He is alert and oriented to person, place, and time.  Psychiatric:        Mood and Affect: Mood and affect and mood normal.        Behavior: Behavior normal.     ED Results / Procedures / Treatments   Labs (all labs ordered are listed, but only abnormal results are displayed) Labs Reviewed - No data to display  EKG None  Radiology No results found.  Procedures Procedures (including critical care time)  Medications Ordered  in ED Medications  milrinone (PRIMACOR) 20 MG/100 ML (0.2 mg/mL) infusion (has no administration in time range)    ED Course  I have reviewed the triage vital signs and the nursing notes.  Pertinent labs & imaging results that were available during my care of the patient were reviewed by me and considered in my medical decision making (see chart for details).    MDM Rules/Calculators/A&P                          With PICC line removal.  He receives milrinone, continuous infusion.  Asymptomatic currently.  Discussed case with Dr. Judd Lien, who agrees with plan to discuss case with Heart Failure team for guidance.   2:41 AM I discussed the case with Dr. Okey Dupre, cardiology, who states that it will be ok to restart milrinone through PIV while awaiting PICC insertion.  States that as long as patient isn't decompensating, no other labs or workup are required.  Patient waiting on PICC line placement.  He is receiving milrinone through PIV.  He is remained stable throughout the evening.  Patient signed out to oncoming team.  Plan at signout is to ensure that PICC is placed.  Following PICC placement, patient can be discharged with outpatient follow-up.  Final Clinical Impression(s) / ED Diagnoses Final diagnoses:  Problem with vascular access    Rx / DC  Orders ED Discharge Orders    None       Roxy Horseman, PA-C 12/20/19 4580    Geoffery Lyons, MD 12/20/19 [phone]

## 2020-01-11 ENCOUNTER — Encounter (HOSPITAL_COMMUNITY): Payer: Self-pay

## 2020-01-11 ENCOUNTER — Other Ambulatory Visit: Payer: Self-pay

## 2020-01-11 ENCOUNTER — Ambulatory Visit (HOSPITAL_COMMUNITY)
Admission: RE | Admit: 2020-01-11 | Discharge: 2020-01-11 | Disposition: A | Discharge status: Home / Self Care | Payer: Medicaid Other | Source: Ambulatory Visit | Attending: Cardiology | Admitting: Cardiology

## 2020-01-11 VITALS — BP 100/70 | HR 70 | Wt 135.4 lb

## 2020-01-11 DIAGNOSIS — E119 Type 2 diabetes mellitus without complications: Secondary | ICD-10-CM | POA: Insufficient documentation

## 2020-01-11 DIAGNOSIS — Z7901 Long term (current) use of anticoagulants: Secondary | ICD-10-CM | POA: Insufficient documentation

## 2020-01-11 DIAGNOSIS — Z7982 Long term (current) use of aspirin: Secondary | ICD-10-CM | POA: Insufficient documentation

## 2020-01-11 DIAGNOSIS — Z79899 Other long term (current) drug therapy: Secondary | ICD-10-CM | POA: Insufficient documentation

## 2020-01-11 DIAGNOSIS — I69320 Aphasia following cerebral infarction: Secondary | ICD-10-CM | POA: Diagnosis not present

## 2020-01-11 DIAGNOSIS — Z8249 Family history of ischemic heart disease and other diseases of the circulatory system: Secondary | ICD-10-CM | POA: Insufficient documentation

## 2020-01-11 DIAGNOSIS — I2510 Atherosclerotic heart disease of native coronary artery without angina pectoris: Secondary | ICD-10-CM | POA: Insufficient documentation

## 2020-01-11 DIAGNOSIS — I110 Hypertensive heart disease with heart failure: Secondary | ICD-10-CM | POA: Diagnosis present

## 2020-01-11 DIAGNOSIS — I4892 Unspecified atrial flutter: Secondary | ICD-10-CM | POA: Diagnosis not present

## 2020-01-11 DIAGNOSIS — Z7984 Long term (current) use of oral hypoglycemic drugs: Secondary | ICD-10-CM | POA: Diagnosis not present

## 2020-01-11 DIAGNOSIS — Z833 Family history of diabetes mellitus: Secondary | ICD-10-CM | POA: Diagnosis not present

## 2020-01-11 DIAGNOSIS — I482 Chronic atrial fibrillation, unspecified: Secondary | ICD-10-CM | POA: Diagnosis not present

## 2020-01-11 DIAGNOSIS — R188 Other ascites: Secondary | ICD-10-CM | POA: Insufficient documentation

## 2020-01-11 DIAGNOSIS — I5022 Chronic systolic (congestive) heart failure: Secondary | ICD-10-CM | POA: Diagnosis not present

## 2020-01-11 DIAGNOSIS — I428 Other cardiomyopathies: Secondary | ICD-10-CM | POA: Insufficient documentation

## 2020-01-11 LAB — BASIC METABOLIC PANEL
Anion gap: 11 (ref 5–15)
BUN: 25 mg/dL — ABNORMAL HIGH (ref 8–23)
CO2: 23 mmol/L (ref 22–32)
Calcium: 9.4 mg/dL (ref 8.9–10.3)
Chloride: 100 mmol/L (ref 98–111)
Creatinine, Ser: 1.21 mg/dL (ref 0.61–1.24)
GFR, Estimated: >60 mL/min (ref >60)
Glucose, Bld: 131 mg/dL — ABNORMAL HIGH (ref 70–99)
Potassium: 3.8 mmol/L (ref 3.5–5.1)
Sodium: 134 mmol/L — ABNORMAL LOW (ref 135–145)

## 2020-01-11 MED ORDER — TORSEMIDE 20 MG PO TABS: 60.0000 mg | ORAL_TABLET | Freq: Every day | ORAL | 1 refills | Status: DC

## 2020-01-11 NOTE — Progress Notes (Signed)
Advanced Heart Failure Clinic Note   Referring Physician: PCP: Iona Beard, MD PCP-Cardiologist: Lauree Chandler, MD  [name]: Dr. Aundra Dubin    Reason for Visit: f/u for chronic systolic heart failure   HPI:  65 y.o. with history of chronic atrial fibrillation and nonischemic cardiomyopathy, recently admitted 10/21 to [facility] for CHF.  Here today for post hospital f/u.   Patient had CVA in 2010, since that time has had expressive aphasia and right-sided weakness.  He lives with his brother.  In 8/18, he was admited with new atrial flutter and EF was found to be 20%.  He had cath in 8/18 showing mild nonobstructive coronary disease.  In 10/18, he had DCCV to NSR.  In 11/18 while in NSR, echo showed EF up to 45-50%.  By 2019, he was back in atrial fibrillation.  He was not cardioverted again. Echo in 4/21 showed EF 20-25%.    Recently presented to hospital w/ 2 week h/o increased dyspnea with exertion and developed abdominal swelling.  Failed outpatient diuretic titration. Admitted for IV diuretics. Echo repeated and EF was 20% with mod-severe RV dilation and moderate RV dysfunction, D-shaped septum, dilated IVC, severe biatrial enlargement.  He was in atrial fibrillation in 50s-60s. CT of abdomen showed large volume ascites, had paracentesis with 4.8 L out.  Cardiac MRI showed LV EF 24%, RV mildly dilated with EF 42%, extensive LGE in a non-coronary distribution throughout much of the LV, also involving RV free wall and left and right atrial walls, ECV 42%. This is suggestive of infiltrative disease. 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  He was initially on milrinone for low output HF, this was weaned off for DCCV which he failed. Haslet 11/4 off milrinone showed optimized filling pressures but low cardiac output, milrinone restarted at  0.125. Discharged w/ home milrinone + home health.   He had initial post hospital f/u on 11/17 and was doing well w/ NYHA Class II-III symptoms.  Wt was up 4 lb from hospital d/c wt, from 134>>138 lb. ReDs Clip 35%. SBP was in the low 100s but no orthostatic symptoms. Spironolactone was increased to 25 mg daily.  He went to [facility] ED on 12/13 after PICC line came out. This was replaced. He continues on home milrinone w/ home health following.   He presents back to clinic today for routine f/u. He is residing w/ family. Here w/ his niece. He reports he has been doing fairly well but has had some mild exertional dyspnea w/ ADLs. No resting dyspnea. Denies orthopnea/PND. No CP. Mildly fluid overloaded on exam. BP stable. Remains on home milrinone. PICC line has come out several times.    Review of systems complete and found to be negative unless listed in HPI.     Past Medical History:  Diagnosis Date  . Atrial flutter ([place])   . CHF (congestive heart failure) ([place])   . Diabetes mellitus   . Hypertension   . NICM (nonischemic cardiomyopathy) ([place]) 08/2016   Mild, nonobstructive CAD at cath, EF initially 20%, improved to 50% 11/2016 echo  . Stroke [facility])     Current Outpatient Medications  Medication Sig Dispense Refill  . acetaminophen (TYLENOL) 500 MG tablet Take 1,000 mg by mouth every 6 (six) hours as needed for pain.    Marland Kitchen apixaban (ELIQUIS) 5 MG TABS tablet Take 1  tablet (5 mg total) by mouth 2 (two) times daily. 60 tablet 10  . aspirin EC 81 MG EC tablet Take 1 tablet (81 mg total) by mouth daily.    Marland Kitchen glimepiride (AMARYL) 1 MG tablet Take by mouth as needed.    . midodrine (PROAMATINE) 2.5 MG tablet Take 1 tablet (2.5 mg total) by mouth 3 (three) times daily with meals. 90 tablet 1  . milrinone (PRIMACOR) 20 MG/100 ML SOLN infusion Inject 0.0139 mg/min into the vein continuous. 100 mL 0  . Multiple Vitamin (MULTIVITAMIN WITH MINERALS) TABS Take 1 tablet by mouth daily.    Marland Kitchen omega-3 acid  ethyl esters (LOVAZA) 1 G capsule Take 1 g by mouth daily.    . simvastatin (ZOCOR) 40 MG tablet Take 40 mg by mouth daily.    Marland Kitchen spironolactone (ALDACTONE) 25 MG tablet Take 1 tablet (25 mg total) by mouth daily. 30 tablet 3  . torsemide (DEMADEX) 20 MG tablet Take 2 tablets (40 mg total) by mouth daily. 30 tablet 1  . Accu-Chek FastClix Lancets MISC 1 each by Other route as directed.    Marland Kitchen ACCU-CHEK GUIDE test strip 1 each by Other route See admin instructions.     No current facility-administered medications for this encounter.    No Known Allergies    Social History   Socioeconomic History  . Marital status: Single    Spouse name: Not on file  . Number of children: Not on file  . Years of education: Not on file  . Highest education level: Not on file  Occupational History  . Not on file  Tobacco Use  . Smoking status: Never Smoker  . Smokeless tobacco: Never Used  Vaping Use  . Vaping Use: Never used  Substance and Sexual Activity  . Alcohol use: No  . Drug use: No  . Sexual activity: Not Currently  Other Topics Concern  . Not on file  Social History Narrative  . Not on file   Social Determinants of Health   Financial Resource Strain: Not on file  Food Insecurity: Not on file  Transportation Needs: Not on file  Physical Activity: Not on file  Stress: Not on file  Social Connections: Not on file  Intimate Partner Violence: Not on file      Family History  Problem Relation Age of Onset  . Hypertension Mother   . Diabetes Mother   . Heart disease Father     Vitals:   01/11/20 1234  BP: 100/70  Pulse: 70  SpO2: 98%  Weight: 61.4 kg (135 lb 6.4 oz)     PHYSICAL EXAM:  General: thin but otherwise well appearing AAM. No respiratory difficulty HEENT: normal Neck: supple. JVD 8-9 cm. Carotids 2+ bilat; no bruits. No lymphadenopathy or thyromegaly appreciated. Cor: PMI nondisplaced. Regular rate & rhythm. No rubs, gallops or murmurs. Lungs:  clear Abdomen: soft, nontender, nondistended. No hepatosplenomegaly. No bruits or masses. Good bowel sounds. Extremities: no cyanosis, clubbing, rash, trace bilateral ankle edema + RUE PICC  Neuro: alert & oriented x 3 moves all 4 extremities w/o difficulty. Affect pleasant. Mild residual speech deficit from CVA   ECG: not performed   ASSESSMENT & PLAN:  1. Chronic systolic CHF: Nonischemic cardiomyopathy known since 2018. Improved initially in 2018 with DCCV, but has been back in atrial fibrillation since 2019 it appears and EF has been low. Recent admit 10/21 for a/c HF w/ low output requiring milrinone. Echo showed EF 15-20% with mod-severe RV dilation  and moderate RV dysfunction, D-shaped septum, dilated IVC, severe biatrial enlargement. Significant RV failure with ascites on CT abdomen, had paracentesis.  Cardiac MRI showed LV EF 24%, RV mildly dilated with EF 42%, extensive LGE in a non-coronary distribution throughout much of the LV, also involving RV free wall and left and right atrial walls, ECV 42%. This is suggestive of infiltrative disease. 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  He was initially on milrinone for low output HF, this was weaned off for DCCV which he failed. RHC 11/4 off milrinone showed optimized filling pressures but low cardiac output, milrinone restarted at 0.125.  - Remains on home milrinone 0.125 mcg. Home health following. PICC line has come out several times and re-inserted in ED. D/w his niece and w/ Dr. Shirlee Latch. Will place referral to IR for tunneled PICC - NYHA Class II-III. Mildly fluid overloaded on exam, BP soft low 100s systolic - Increase torsemide to 60 mg daily   - Continue Spiro 25 mg daily  - BP too soft for ARB/ARNi - NO dig or  blocker with bradycardia + recent low output - Continue  midodrine 2.5 mg tid for BP support  - Narrow QRS, not CRT candidate.  - Not  a good candidate for VAD. Patient assessed by our LVAD nurse, she had significant concerns about his right arm/hand weakness. He would be unable to manage his LVAD equipment and would need consistent 24 hr caregiver (not available).  - check BMP today and again in 7 days  2. Atrial fibrillation: Now chronic. Initially in 2018, EF improved after DCCV. However, as above, he has been relatively well rate controlled and has not been in RVR, so do not think that cardiomyopathy is primarily due to AF. He failed DCCV on amiodarone 10/21, amiodarone due stopped due to bradycardia.  - Continue Eliquis. Denies abnormal bleeding  3. Ascites:Suspect due to RV failure. S/p Paracentesis on 10/31 with 4.8 L out. No evidence of recurrence on exam today  4. H/o CVA: In 2010, with expressive aphasia and right-sided weakness.    F/u w/ Dr. Shirlee Latch in 8 weeks    Robbie Lis, PA-C 01/11/20

## 2020-01-11 NOTE — Patient Instructions (Signed)
INCREASE Torsemide to 60 mg, (three tabs) daily  Labs today We will only contact you if something comes back abnormal or we need to make some changes. Otherwise no news is good news!  You have been referred to [facility] Radiology for tunneled cath placement -they will be in contact for appt   Your physician recommends that you schedule a follow-up appointment in: 2-3 months with Dr Shirlee Latch  If you have any questions or concerns before your next appointment please send [name] a message through [facility] or call our office at [phone].    TO LEAVE A MESSAGE FOR THE NURSE SELECT OPTION 2, PLEASE LEAVE A MESSAGE INCLUDING: . YOUR NAME . DATE OF BIRTH . CALL BACK NUMBER . REASON FOR CALL**this is important as we prioritize the call backs  YOU WILL RECEIVE A CALL BACK THE SAME DAY AS LONG AS YOU CALL BEFORE 4:00 PM  Do the following things EVERYDAY: 1) Weigh yourself in the morning before breakfast. Write it down and keep it in a log. 2) Take your medicines as prescribed 3) Eat low salt foods-Limit salt (sodium) to 2000 mg per day.  4) Stay as active as you can everyday 5) Limit all fluids for the day to less than 2 liters .

## 2020-02-07 ENCOUNTER — Other Ambulatory Visit: Payer: Self-pay | Admitting: Cardiovascular Disease

## 2020-02-07 NOTE — Telephone Encounter (Signed)
Prescription refill request for Eliquis received. Indication: Afib Last office visit: 10/06/2019, M[place] Scr: 1.21, 01/11/2020 Age: 65 Weight: 61.4 kg   Prescription refill sent

## 2020-02-22 ENCOUNTER — Telehealth: Payer: Self-pay | Admitting: Physician Assistant

## 2020-02-22 ENCOUNTER — Telehealth (HOSPITAL_COMMUNITY): Payer: Self-pay | Admitting: *Deleted

## 2020-02-22 DIAGNOSIS — I420 Dilated cardiomyopathy: Secondary | ICD-10-CM

## 2020-02-22 DIAGNOSIS — I5022 Chronic systolic (congestive) heart failure: Secondary | ICD-10-CM

## 2020-02-22 NOTE — Telephone Encounter (Signed)
Patient needs called because they are concerned about how much the IV PICC line tubing has come out of the site.  She states that a new agency is managing the PICC line and the nurses do not seem to know what they are doing.  The PICC line was noted to have been pulled out after the dressing had been changed and the niece assures me that no one is in the family messes with the line or the dressing at all.  Upon review of previous phone notes, the PICC line was noted to be 9.5 cm out and arrangements are already in process to get replaced tomorrow.  The niece states that the milrinone is running without any difficulty, the arm is not swelling and he is not in any discomfort at all.  Advised that as long as the IV is running and there is no swelling in his arm, it is okay to continue the milrinone overnight.  Tomorrow, follow-up with the heart failure clinic today and get it scheduled for IR to replace the line.  Theodore Demark, PA-C 02/22/2020 8:51 PM

## 2020-02-22 NOTE — Telephone Encounter (Signed)
Mary w/[facility] infusion left VM reporting PICC line out 9.5cm but is still infusing milrinone. Per Dr.McLean get IR to replace line. I called IR scheduling and the PA and they said could not work patient in today. I called [facility] and could not get a scheduler on the phone. Heather Robyn Haber called Jeri Modena she is going to see if a [facility] can start a peripheral line until patient can be scheduled tomorrow if not pt will be advised to go to the emergency room. Heather Schub,RN to follow up with [facility].

## 2020-02-23 ENCOUNTER — Encounter (HOSPITAL_COMMUNITY): Payer: Self-pay | Admitting: Emergency Medicine

## 2020-02-23 ENCOUNTER — Emergency Department (HOSPITAL_COMMUNITY)
Admission: EM | Admit: 2020-02-23 | Discharge: 2020-02-23 | Disposition: A | Discharge status: Home / Self Care | Payer: Medicaid Other | Attending: Emergency Medicine | Admitting: Emergency Medicine

## 2020-02-23 DIAGNOSIS — Z79899 Other long term (current) drug therapy: Secondary | ICD-10-CM | POA: Diagnosis not present

## 2020-02-23 DIAGNOSIS — Z7901 Long term (current) use of anticoagulants: Secondary | ICD-10-CM | POA: Diagnosis not present

## 2020-02-23 DIAGNOSIS — Z7982 Long term (current) use of aspirin: Secondary | ICD-10-CM | POA: Insufficient documentation

## 2020-02-23 DIAGNOSIS — Z7984 Long term (current) use of oral hypoglycemic drugs: Secondary | ICD-10-CM | POA: Insufficient documentation

## 2020-02-23 DIAGNOSIS — I110 Hypertensive heart disease with heart failure: Secondary | ICD-10-CM | POA: Insufficient documentation

## 2020-02-23 DIAGNOSIS — I5023 Acute on chronic systolic (congestive) heart failure: Secondary | ICD-10-CM | POA: Diagnosis not present

## 2020-02-23 DIAGNOSIS — Z452 Encounter for adjustment and management of vascular access device: Secondary | ICD-10-CM | POA: Diagnosis present

## 2020-02-23 DIAGNOSIS — Z4589 Encounter for adjustment and management of other implanted devices: Secondary | ICD-10-CM

## 2020-02-23 DIAGNOSIS — E119 Type 2 diabetes mellitus without complications: Secondary | ICD-10-CM | POA: Insufficient documentation

## 2020-02-23 NOTE — ED Provider Notes (Addendum)
[facility] EMERGENCY DEPARTMENT Provider Note   CSN: [id number] Arrival date & time: 02/23/20  1478     History Chief Complaint  Patient presents with  . Vascular Access Problem    Picc line out     [name] is a 65 y.o. male.  HPI 65 year old male with a history of atrial flutter, CHF, DM type II, hypertension, nonischemic cardiomyopathy, stroke, PICC line on chronic milrinone presents to the ER with family with concerns that he had pulled out his PICC line.  Patient states that they at home health nursing come by who pulled on it.  It is still intact, milrinone is still running.  They had called the heart failure clinic and were told that they need to come to the ER to have this fixed.  Per chart review, they had arranged outpatient IR for the patient, however there was miscommunication between the patient's family and the provider.  Patient is followed by [facility] heart care.  Denies any other complaints at this time.    Past Medical History:  Diagnosis Date  . Atrial flutter (HCC)   . CHF (congestive heart failure) (HCC)   . Diabetes mellitus   . Hypertension   . 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 Stroke [facility])     Patient Active Problem List   Diagnosis Date Noted  . Goals of care, counseling/discussion   . Encounter for hospice care discussion   . Palliative care by specialist   . Cardiogenic shock (HCC) 11/05/2019  . Hyponatremia 11/05/2019  . Hypothermia 11/05/2019  . Acute on chronic systolic CHF (congestive heart failure) (HCC) 11/04/2019  . NICM (nonischemic cardiomyopathy) (HCC) 07/16/2019  . Permanent atrial fibrillation (HCC)   . Acute systolic CHF (congestive heart failure) (HCC) 08/01/2017  . Acute CHF (congestive heart failure) (HCC) 08/01/2017  . Acute on chronic HFrEF (heart failure with reduced ejection fraction) (HCC)   .  Persistent atrial fibrillation (HCC)   . Essential hypertension 07/15/2017  . Type 2 diabetes mellitus without complication, without long-term current use of insulin (HCC) 09/09/2016  . Acute systolic (congestive) heart failure (HCC) 09/07/2016  . Atrial flutter (HCC) 09/04/2016  . Cardiomyopathy (HCC) 06/02/2008  . ABNORMAL EKG 05/02/2008  . DIABETES MELLITUS, TYPE II 04/28/2008  . HLD (hyperlipidemia) 04/28/2008  . MIGRAINE HEADACHE 04/28/2008  . History of stroke 04/28/2008    Past Surgical History:  Procedure Laterality Date  . CARDIOVERSION N/A 10/10/2016   Procedure: CARDIOVERSION;  Surgeon: Lewayne Bunting, MD;  Location: [facility] ENDOSCOPY;  Service: Cardiovascular;  Laterality: N/A;  . CARDIOVERSION N/A 11/10/2019   Procedure: CARDIOVERSION;  Surgeon: Laurey Morale, MD;  Location: [facility] ENDOSCOPY;  Service: Cardiovascular;  Laterality: N/A;  . RIGHT HEART CATH N/A 11/11/2019   Procedure: RIGHT HEART CATH;  Surgeon: Laurey Morale, MD;  Location: [facility] INVASIVE CV LAB;  Service: Cardiovascular;  Laterality: N/A;  . RIGHT/LEFT HEART CATH AND CORONARY ANGIOGRAPHY N/A 09/06/2016   Procedure: RIGHT/LEFT HEART CATH AND CORONARY ANGIOGRAPHY;  Surgeon: Lennette Bihari, MD;  Location: [facility] INVASIVE CV LAB;  Service: Cardiovascular;  Laterality: N/A;  . TRACHEOSTOMY         Family History  Problem Relation Age of Onset  . Hypertension Mother   . Diabetes Mother   . Heart disease Father     Social History   Tobacco Use  . Smoking status: Never Smoker  .  Smokeless tobacco: Never Used  Vaping Use  . Vaping Use: Never used  Substance Use Topics  . Alcohol use: No  . Drug use: No    Home Medications Prior to Admission medications   Medication Sig Start Date End Date Taking? Authorizing Provider  Accu-Chek FastClix Lancets MISC 1 each by Other route as directed. 08/19/19   [provider]  ACCU-CHEK GUIDE test strip 1 each by Other route See admin instructions. 09/16/19    [provider]  acetaminophen (TYLENOL) 500 MG tablet Take 1,000 mg by mouth every 6 (six) hours as needed for pain.    [provider]  aspirin EC 81 MG EC tablet Take 1 tablet (81 mg total) by mouth daily. 09/09/16   Leone Brand, NP  ELIQUIS 5 MG TABS tablet TAKE 1 TABLET(5 MG) BY MOUTH TWICE DAILY 02/07/20   Kathleene Hazel, MD  glimepiride (AMARYL) 1 MG tablet Take by mouth as needed. 12/11/19   [provider]  midodrine (PROAMATINE) 2.5 MG tablet Take 1 tablet (2.5 mg total) by mouth 3 (three) times daily with meals. 11/16/19   Cipriano Bunker, MD  milrinone (PRIMACOR) 20 MG/100 ML SOLN infusion Inject 0.0139 mg/min into the vein continuous. 11/16/19   Cipriano Bunker, MD  Multiple Vitamin (MULTIVITAMIN WITH MINERALS) TABS Take 1 tablet by mouth daily.    [provider]  omega-3 acid ethyl esters (LOVAZA) 1 G capsule Take 1 g by mouth daily.    [provider]  simvastatin (ZOCOR) 40 MG tablet Take 40 mg by mouth daily.    [provider]  spironolactone (ALDACTONE) 25 MG tablet Take 1 tablet (25 mg total) by mouth daily. 11/24/19   Robbie Lis M, PA-C  torsemide (DEMADEX) 20 MG tablet Take 3 tablets (60 mg total) by mouth daily. 01/11/20   Allayne Butcher, PA-C    Allergies    Patient has no known allergies.  Review of Systems   Review of Systems  Respiratory: Negative for shortness of breath.   Cardiovascular: Negative for chest pain.    Physical Exam Updated Vital Signs BP 123/85   Pulse (!) 44   Temp 97.6 F (36.4 C) (Oral)   Resp 20   SpO2 100%   Physical Exam Vitals and nursing note reviewed.  Constitutional:      Appearance: He is well-developed and well-nourished.  HENT:     Head: Normocephalic and atraumatic.  Eyes:     Conjunctiva/sclera: Conjunctivae normal.  Cardiovascular:     Rate and Rhythm: Normal rate and regular rhythm.     Heart sounds: No murmur heard.   Pulmonary:     Effort:  Pulmonary effort is normal. No respiratory distress.     Breath sounds: Normal breath sounds.  Abdominal:     Palpations: Abdomen is soft.     Tenderness: There is no abdominal tenderness.  Musculoskeletal:        General: No edema.     Cervical back: Neck supple.     Comments: PICC line in left arm still intact.  Milrinone running.  No excessive erythema, redness, swelling.  Skin:    General: Skin is warm and dry.  Neurological:     Mental Status: He is alert.  Psychiatric:        Mood and Affect: Mood and affect normal.     ED Results / Procedures / Treatments   Labs (all labs ordered are listed, but only abnormal results are displayed) Labs Reviewed -  No data to display  EKG None  Radiology No results found.  Procedures Procedures   Medications Ordered in ED Medications - No data to display  ED Course  I have reviewed the triage vital signs and the nursing notes.  Pertinent labs & imaging results that were available during my care of the patient were reviewed by me and considered in my medical decision making (see chart for details).    MDM Rules/Calculators/A&P                         65 year old male here for PICC line adjustment.  Contacted our IR team, who unfortunately has a very busy schedule today.  I did speak with the on-call provider Fleet Contras for the [facility] heart failure team, who was able to arrange an appointment over at [facility] for the patient tomorrow at 9 AM.  I have asked to place a peripheral IV and attach his milrinone to this which was successfully done by nursing staff.  Information was relayed to family, will discharge and instructed to go to IR at [facility].  They voiced understanding and are agreeable.   Final Clinical Impression(s) / ED Diagnoses Final diagnoses:  Encounter for management of peripherally inserted central catheter (PICC)    Rx / DC Orders ED Discharge Orders    None          Leone Brand 02/23/20 1114     Wynetta Fines, MD 02/25/20 [phone]

## 2020-02-23 NOTE — Telephone Encounter (Signed)
Per Dr Wilmon Pali to add on to [facility] today as he spoke directly to Dr Koleen Distance unable to provide appt time however pt can head over now and wait for add on procedure.

## 2020-02-23 NOTE — ED Triage Notes (Addendum)
Pt arrives to ED stating that while the home health nurse was changing his dressing the picc line cathter was nearly pulled out, currently the end of the cathter remains in his arm and his milrinone drip is still infusing. Family is concerned the cathter is to far out. He denies any pain to the site of picc line and there is no clear swelling noted.

## 2020-02-23 NOTE — Addendum Note (Signed)
Addended by: Theresia Bough on: 02/23/2020 10:18 AM   Modules accepted: Orders

## 2020-02-23 NOTE — Telephone Encounter (Signed)
Per Amy Clegg,NP Pt is in the ER and will need picc replaced ASAP  Order placed [place] with [facility] IR is full to capacity Per [facility] IR schedule is also full to capacity, will need provider to provider call/consult to add on for today (Dr Bridgette Habermann [phone])   Message to Dr Shirlee Latch

## 2020-02-23 NOTE — Discharge Instructions (Addendum)
Please go to the front entrance of [facility] tomorrow at 9 AM.  Ask for interventional radiology.

## 2020-02-23 NOTE — Telephone Encounter (Signed)
Per [facility] they are not able to get pt's picc replaced today they ask if pt can be sch for replacement tomorrow 2/17 at 9 am, advised that is ok as long as pt can get PIV to infuse med while awaiting replacement, she advised [name] to call [place] and see if that can be done  Per Tonye Becket, NP this is the best plan for the pt's safety  Called and spoke w/pt's RN in ED, they will start PIV to infuse milrinone and will advise pt to go to [facility] radiology dept tomorrow at 9 am

## 2020-02-23 NOTE — Progress Notes (Signed)
PICC line exchange order received. Spoke to Primary RN re: PICC exchange, Per RN patient is scheduled for PICC placement at [facility].  While talking to primary RN  Patient is ready for discharge.

## 2020-02-24 ENCOUNTER — Other Ambulatory Visit: Payer: Self-pay

## 2020-02-24 ENCOUNTER — Ambulatory Visit (HOSPITAL_COMMUNITY)
Admission: RE | Admit: 2020-02-24 | Discharge: 2020-02-24 | Disposition: A | Discharge status: Home / Self Care | Payer: Medicaid Other | Source: Ambulatory Visit | Attending: Cardiology | Admitting: Cardiology

## 2020-02-24 DIAGNOSIS — Z452 Encounter for adjustment and management of vascular access device: Secondary | ICD-10-CM | POA: Insufficient documentation

## 2020-02-24 DIAGNOSIS — I5022 Chronic systolic (congestive) heart failure: Secondary | ICD-10-CM

## 2020-02-24 DIAGNOSIS — I420 Dilated cardiomyopathy: Secondary | ICD-10-CM | POA: Diagnosis not present

## 2020-02-24 MED ORDER — LIDOCAINE HCL 1 % IJ SOLN
INTRAMUSCULAR | Status: AC
  Administered 2020-02-24: 3 mL via SUBCUTANEOUS
  Filled 2020-02-24: qty 20

## 2020-02-24 NOTE — Procedures (Signed)
PROCEDURE SUMMARY:  Successful replacement/exchange of image-guided dual lumen PICC line to the left brachial vein. Length 40 cm. Tip at lower SVC/RA. No complications. Sutured in place per family request using 3-0 ethilon suture. EBL < 2 mL. Ready for use.  Please see imaging section of Epic for full dictation.   Gordy Councilman Lue [place] PA-C 02/24/2020 9:52 AM

## 2020-03-01 ENCOUNTER — Encounter: Payer: Self-pay | Admitting: Podiatry

## 2020-03-01 ENCOUNTER — Ambulatory Visit (INDEPENDENT_AMBULATORY_CARE_PROVIDER_SITE_OTHER): Payer: Medicaid Other | Admitting: Podiatry

## 2020-03-01 ENCOUNTER — Other Ambulatory Visit: Payer: Self-pay

## 2020-03-01 DIAGNOSIS — L84 Corns and callosities: Secondary | ICD-10-CM | POA: Diagnosis not present

## 2020-03-01 DIAGNOSIS — B351 Tinea unguium: Secondary | ICD-10-CM | POA: Diagnosis not present

## 2020-03-01 DIAGNOSIS — M79675 Pain in left toe(s): Secondary | ICD-10-CM

## 2020-03-01 DIAGNOSIS — E1151 Type 2 diabetes mellitus with diabetic peripheral angiopathy without gangrene: Secondary | ICD-10-CM

## 2020-03-01 DIAGNOSIS — M79674 Pain in right toe(s): Secondary | ICD-10-CM | POA: Diagnosis not present

## 2020-03-01 DIAGNOSIS — M2041 Other hammer toe(s) (acquired), right foot: Secondary | ICD-10-CM

## 2020-03-01 DIAGNOSIS — M2042 Other hammer toe(s) (acquired), left foot: Secondary | ICD-10-CM

## 2020-03-05 NOTE — Progress Notes (Addendum)
  Subjective:  Patient ID: [name], male    DOB: 21-Aug-1955,  MRN: [id number]  65 y.o. male presents with preventative diabetic foot care and painful thick toenails that are difficult to trim. Pain interferes with ambulation. Aggravating factors include wearing enclosed shoe gear. Pain is relieved with periodic professional debridement.   Objective:  There were no vitals filed for this visit. Constitutional Patient is a pleasant 65 y.o. African American male in NAD.  AAO x 3.  Vascular Capillary fill time to digits <3 seconds b/l lower extremities. Nonpalpable DP pulse(s) b/l lower extremities. Nonpalpable PT pulse(s) b/l lower extremities. Pedal hair absent. Lower extremity skin temperature gradient within normal limits. No ischemia or gangrene noted b/l lower extremities. No cyanosis or clubbing noted.  Neurologic Status post CVA with speech deficit. Oriented to person, place, and time. Protective sensation intact 5/5 intact bilaterally with 10g monofilament b/l. Vibratory sensation intact b/l. Clonus negative b/l.  Dermatologic Pedal skin is thin shiny, atrophic b/l lower extremities. No open wounds bilaterally. No interdigital macerations bilaterally. Toenails 1-5 b/l elongated, discolored, dystrophic, thickened, crumbly with subungual debris and tenderness to dorsal palpation. Hyperkeratotic lesion(s) R 5th toe.  No erythema, no edema, no drainage, no fluctuance.  Orthopedic: Muscle strength 5/5 to all LE muscle groups of left lower extremity. No pain crepitus or joint limitation noted with ROM b/l. Hammertoes noted to the R 5th toe. Utilizes wheelchair for mobility assistance. Spastic hemiplegia RLE.   Hemoglobin A1C Latest Ref Rng & Units 11/05/2019 07/16/2019  HGBA1C 4.8 - 5.6 % 5.4 6.0(H)  Some recent data might be hidden   Assessment:   1. Pain due to onychomycosis of toenails of both feet   2. Corns   3. Acquired hammertoes of both feet   4. Type II diabetes mellitus with  peripheral circulatory disorder [facility])    Plan:  Patient was evaluated and treated and all questions answered.  Onychomycosis with pain -Nails palliatively debridement as below. -Educated on self-care  Procedure: Nail Debridement Rationale: Pain Type of Debridement: manual, sharp debridement. Instrumentation: Nail nipper, rotary burr. Number of Nails: 10  -Examined patient. -Medicaid ABN signed by patient's brother consenting to paring of corn right 5th digit. Copy given to patient's brother and copy placed in patient's chart. -Corn(s) right 5th digit pared utilizing sterile scalpel blade without incident. -Continue diabetic foot care principles. -Toenails 1-5 b/l were debrided in length and girth with sterile nail nippers and dremel without iatrogenic bleeding.  -Patient to report any pedal injuries to medical professional immediately. -Patient to continue soft, supportive shoe gear daily. -Patient/POA to call should there be question/concern in the interim.  Return in about 3 months (around 05/29/2020).  Freddie Breech, DPM

## 2020-03-12 ENCOUNTER — Other Ambulatory Visit (HOSPITAL_COMMUNITY): Payer: Self-pay | Admitting: Cardiology

## 2020-03-27 ENCOUNTER — Encounter: Payer: Self-pay | Admitting: Cardiovascular Disease

## 2020-03-27 ENCOUNTER — Other Ambulatory Visit: Payer: Self-pay

## 2020-03-27 ENCOUNTER — Ambulatory Visit (INDEPENDENT_AMBULATORY_CARE_PROVIDER_SITE_OTHER): Payer: Medicaid Other | Admitting: Cardiovascular Disease

## 2020-03-27 VITALS — BP 100/60 | HR 45 | Ht 67.0 in | Wt 130.2 lb

## 2020-03-27 DIAGNOSIS — I5022 Chronic systolic (congestive) heart failure: Secondary | ICD-10-CM

## 2020-03-27 DIAGNOSIS — I4819 Other persistent atrial fibrillation: Secondary | ICD-10-CM | POA: Diagnosis not present

## 2020-03-27 DIAGNOSIS — E785 Hyperlipidemia, unspecified: Secondary | ICD-10-CM

## 2020-03-27 DIAGNOSIS — I428 Other cardiomyopathies: Secondary | ICD-10-CM | POA: Diagnosis not present

## 2020-03-27 MED ORDER — APIXABAN 5 MG PO TABS: ORAL_TABLET | ORAL | 3 refills | Status: DC

## 2020-03-27 MED ORDER — TORSEMIDE 20 MG PO TABS: 20.0000 mg | ORAL_TABLET | Freq: Two times a day (BID) | ORAL | 3 refills | Status: DC

## 2020-03-27 MED ORDER — SPIRONOLACTONE 25 MG PO TABS: ORAL_TABLET | ORAL | 3 refills | Status: DC

## 2020-03-27 NOTE — Patient Instructions (Signed)
Medication Instructions:  Your physician recommends that you continue on your current medications as directed. Please refer to the Current Medication list given to you today.  *If you need a refill on your cardiac medications before your next appointment, please call your pharmacy*   Lab Work: none If you have labs (blood work) drawn today and your tests are completely normal, you will receive your results only by: MyChart Message (if you have MyChart) OR A paper copy in the mail If you have any lab test that is abnormal or we need to change your treatment, we will call you to review the results.   Testing/Procedures: none   Follow-Up: At [facility], you and your health needs are our priority.  As part of our continuing mission to provide you with exceptional heart care, we have created designated Provider Care Teams.  These Care Teams include your primary Cardiologist (physician) and Advanced Practice Providers (APPs -  Physician Assistants and Nurse Practitioners) who all work together to provide you with the care you need, when you need it.  We recommend signing up for the patient portal called "MyChart".  Sign up information is provided on this After Visit Summary.  MyChart is used to connect with patients for Virtual Visits (Telemedicine).  Patients are able to view lab/test results, encounter notes, upcoming appointments, etc.  Non-urgent messages can be sent to your provider as well.   To learn more about what you can do with MyChart, go to [email or web address].    

## 2020-03-27 NOTE — Progress Notes (Signed)
Chief Complaint  Patient presents with  . Follow-up    Chronic systolic CHF   History of Present Illness: 65 yo male with history of CVA in 2010, expressive aphasia, chronic right sided weakness, HTN, DM, non-ischemic cardiomyopathy, chronic systolic CHF and atrial flutter who is here today for cardiac follow up. He was admitted to [facility] 09/04/16 with atrial flutter and was found to have LVEF=20%. Cardiac cath 09/06/16 with mild non-obstructive CAD. He was felt to have a non-ischemic cardiomyopathy. His heart rate was controlled on a low dose beta blocker. He was started on Eliquis. He was also started on Entresto. DCCV at [facility] 10/10/16. LVEF improved to 45-50% by echo November 2018. He was seen in our office 07/15/17 and was back in atrial flutter with heart rate in the 50s.  He was admitted to [facility] 07/31/17 with CHF and diuresed quickly with IV Lasix. I saw him in the office in April 2021 and he had mild volume overload. Lasix was increased. Echo April 2021 with LVEF=20-25%, global hypokinesis, mild MR. He was admitted to [facility] July 2021 with volume overload. He was diuresed with IV Lasix. Coreg stopped due to hypotension. He was admitted to [facility] October 2021 with acute CHF. He has been followed in our Advanced Heart Failure clinic. Echo October 2021 with LVEF=20%, moderate to severe RV dysfunction, severe biatrial enlargement. He was in atrial fibrillation during the admission in October 2021. CT abdomen with large volume ascites. Paracentesis with 4.8 liters fluid removed. Cardiac MRI with LVEF=24%, extensive LGE suggestive of infiltrative disease. 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 He was on milrinone during the hospitalization and was discharged home on milrinone. Right heart cath October 2021 with optimized filling pressures but low cardiac output. Toprol and Entresto was stopped prior to discharge secondary to hypotension. He is now off of midodrine.   He is here today for follow up. The  patient denies any chest pain, dyspnea, palpitations, lower extremity edema, orthopnea, PND, dizziness, near syncope or syncope.   Primary Care Physician: Mirna Mires, MD  Past Medical History:  Diagnosis Date  . Atrial flutter (HCC)   . CHF (congestive heart failure) (HCC)   . Diabetes mellitus   . Hypertension   . 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 Stroke [facility])     Past Surgical History:  Procedure Laterality Date  . CARDIOVERSION N/A 10/10/2016   Procedure: CARDIOVERSION;  Surgeon: Lewayne Bunting, MD;  Location: [facility] ENDOSCOPY;  Service: Cardiovascular;  Laterality: N/A;  . CARDIOVERSION N/A 11/10/2019   Procedure: CARDIOVERSION;  Surgeon: Laurey Morale, MD;  Location: [facility] ENDOSCOPY;  Service: Cardiovascular;  Laterality: N/A;  . RIGHT HEART CATH N/A 11/11/2019   Procedure: RIGHT HEART CATH;  Surgeon: Laurey Morale, MD;  Location: [facility] INVASIVE CV LAB;  Service: Cardiovascular;  Laterality: N/A;  . RIGHT/LEFT HEART CATH AND CORONARY ANGIOGRAPHY N/A 09/06/2016   Procedure: RIGHT/LEFT HEART CATH AND CORONARY ANGIOGRAPHY;  Surgeon: Lennette Bihari, MD;  Location: [facility] INVASIVE CV LAB;  Service: Cardiovascular;  Laterality: N/A;  . TRACHEOSTOMY      Current Outpatient Medications  Medication Sig Dispense Refill  . Accu-Chek FastClix Lancets MISC 1 each by Other route as directed.    Marland Kitchen ACCU-CHEK GUIDE test strip 1 each by Other route See admin instructions.    Marland Kitchen acetaminophen (TYLENOL) 500 MG tablet Take 1,000 mg by mouth every 6 (six)  hours as needed for pain.    Marland Kitchen aspirin EC 81 MG EC tablet Take 1 tablet (81 mg total) by mouth daily.    . ATIVAN 0.5 MG tablet Take 1 tablet by mouth every four hours as needed Agitation    . glimepiride (AMARYL) 1 MG tablet Take by mouth as needed.    . milrinone (PRIMACOR) 20 MG/100 ML SOLN infusion Inject 0.0139 mg/min into the vein continuous. 100 mL  0  . Milrinone Lactate 50 MG/50ML SOLN     . Multiple Vitamin (MULTIVITAMIN WITH MINERALS) TABS Take 1 tablet by mouth daily.    Marland Kitchen omega-3 acid ethyl esters (LOVAZA) 1 G capsule Take 1 g by mouth daily.    . simvastatin (ZOCOR) 40 MG tablet Take 40 mg by mouth daily.    Marland Kitchen apixaban (ELIQUIS) 5 MG TABS tablet TAKE 1 TABLET(5 MG) BY MOUTH TWICE DAILY 180 tablet 3  . spironolactone (ALDACTONE) 25 MG tablet TAKE 1 TABLET(25 MG) BY MOUTH DAILY 90 tablet 3  . torsemide (DEMADEX) 20 MG tablet Take 1 tablet (20 mg total) by mouth in the morning and at bedtime. 180 tablet 3   No current facility-administered medications for this visit.    No Known Allergies  Social History   Socioeconomic History  . Marital status: Single    Spouse name: Not on file  . Number of children: Not on file  . Years of education: Not on file  . Highest education level: Not on file  Occupational History  . Not on file  Tobacco Use  . Smoking status: Never Smoker  . Smokeless tobacco: Never Used  Vaping Use  . Vaping Use: Never used  Substance and Sexual Activity  . Alcohol use: No  . Drug use: No  . Sexual activity: Not Currently  Other Topics Concern  . Not on file  Social History Narrative  . Not on file   Social Determinants of Health   Financial Resource Strain: Not on file  Food Insecurity: Not on file  Transportation Needs: Not on file  Physical Activity: Not on file  Stress: Not on file  Social Connections: Not on file  Intimate Partner Violence: Not on file    Family History  Problem Relation Age of Onset  . Hypertension Mother   . Diabetes Mother   . Heart disease Father     Review of Systems:  As stated in the HPI and otherwise negative.   BP 100/60   Pulse (!) 45   Ht 5\' 7"  (1.702 m)   Wt 130 lb 3.2 oz (59.1 kg)   SpO2 98%   BMI 20.39 kg/m   Physical Examination:  General: Well developed, well nourished, NAD  HEENT: OP clear, mucus membranes moist  SKIN: warm, dry. No  rashes. Neuro: No focal deficits  Musculoskeletal: Muscle strength 5/5 all ext  Psychiatric: Mood and affect normal  Neck: No JVD, no carotid bruits, no thyromegaly, no lymphadenopathy.  Lungs:Clear bilaterally, no wheezes, rhonci, crackles Cardiovascular: Irreg irreg. No murmurs, gallops or rubs. Abdomen:Soft. Bowel sounds present. Non-tender.  Extremities: No lower extremity edema. Pulses are 2 + in the bilateral DP/PT.  Echo October 2021:  1. Left ventricular ejection fraction, by estimation, is 15-20%. The left  ventricle has severely decreased function. The left ventricle demonstrates  global hypokinesis. Left ventricular diastolic function could not be  evaluated. Elevated left atrial  pressure. There is the interventricular septum is flattened in diastole  ('D' shaped left ventricle), consistent with  right ventricular volume  overload.  2. Right ventricular systolic function is severely reduced. The right  ventricular size is severely enlarged. There is mildly elevated pulmonary  artery systolic pressure. The estimated right ventricular systolic  pressure is 39.8 mmHg.  3. Left atrial size was severely dilated.  4. Right atrial size was severely dilated.  5. The mitral valve is grossly normal. Mild mitral valve regurgitation.  No evidence of mitral stenosis.  6. The tricuspid valve is abnormal. Tricuspid valve regurgitation is  severe.  7. The aortic valve is tricuspid. Aortic valve regurgitation is not  visualized. No aortic stenosis is present.  8. The inferior vena cava is dilated in size with <50% respiratory  variability, suggesting right atrial pressure of 15 mmHg.   Comparison(s): Changes from prior study are noted. LVEF remains ~20% with  global HK. RV severely dilated with severely reduced function. TR is now  severe.   Cardiac cath 09/06/16: Diagnostic Diagram        EKG:  EKG is not ordered today. The ekg ordered today demonstrates   Recent  Labs: 11/04/2019: TSH 4.367 11/11/2019: Platelets 203 11/15/2019: ALT 61; Hemoglobin 11.0; Magnesium 2.4 11/24/2019: B Natriuretic Peptide 1,200.4 01/11/2020: BUN 25; Creatinine, Ser 1.21; Potassium 3.8; Sodium 134   Lipid Panel    Component Value Date/Time   CHOL 93 (L) 05/07/2019 0945   TRIG 59 05/07/2019 0945   HDL 29 (L) 05/07/2019 0945   CHOLHDL 3.2 05/07/2019 0945   CHOLHDL 3.9 Ratio 07/07/2007 2028   VLDL 28 07/07/2007 2028   LDLCALC 50 05/07/2019 0945     Wt Readings from Last 3 Encounters:  03/27/20 130 lb 3.2 oz (59.1 kg)  01/11/20 135 lb 6.4 oz (61.4 kg)  12/20/19 130 lb (59 kg)     Other studies Reviewed: Additional studies/ records that were reviewed today include: . Review of the above records demonstrates:   Assessment and Plan:   1. Atrial fib/flutter: Atrial fibrillation today. Heart rate in the high 40s. Continue Eliquis.  Toprol was stopped due to bradycardia and hypotension.    2. Non-ischemic cardiomyopathy/chronic systolic CHF/Cardiac Amyloidosis: He is being followed in the Advanced Heart Failure Clinic and remains on home milrinone therapy. LVEF around 20% by echo in October 2021. Cardiac MRI suggestive of cardiac amyloidosis. He is not felt to be a candidate for LVAD or transplant. He is doing well on the home milrinone therapy with NYHA Class II-III symptoms. Weight is stable. Continue aldactone and Torsemide. BP has been too soft for ARB/Ace-inh/Entresto or Toprol. Follow up with Dr. Shirlee Latch next week.   3. HLD: LDL at goal in April 2021. Continue statin.     4. CAD without angina: He has no chest pain. Continue ASA and statin.   Current medicines are reviewed at length with the patient today.  The patient does not have concerns regarding medicines.  The following changes have been made:  no change  Labs/ tests ordered today include:   No orders of the defined types were placed in this encounter.  Disposition:   Follow up with me in 12 months if  needed. He is following now in the Advanced Heart Failure clinic and is on palliative milrinone therapy  Signed, Verne Carrow, MD 03/27/2020 10:33 AM    [facility] [address] [place], [place], [place]  [address] Phone: [phone]; Fax: [phone]

## 2020-04-05 ENCOUNTER — Ambulatory Visit (HOSPITAL_COMMUNITY)
Admission: RE | Admit: 2020-04-05 | Discharge: 2020-04-05 | Disposition: A | Discharge status: Home / Self Care | Payer: Medicaid Other | Source: Ambulatory Visit | Attending: Cardiology | Admitting: Cardiology

## 2020-04-05 ENCOUNTER — Other Ambulatory Visit: Payer: Self-pay

## 2020-04-05 ENCOUNTER — Encounter (HOSPITAL_COMMUNITY): Payer: Self-pay | Admitting: Cardiology

## 2020-04-05 VITALS — BP 90/50 | HR 60 | Wt 134.2 lb

## 2020-04-05 DIAGNOSIS — E854 Organ-limited amyloidosis: Secondary | ICD-10-CM | POA: Diagnosis not present

## 2020-04-05 DIAGNOSIS — I5084 End stage heart failure: Secondary | ICD-10-CM | POA: Insufficient documentation

## 2020-04-05 DIAGNOSIS — I69320 Aphasia following cerebral infarction: Secondary | ICD-10-CM | POA: Insufficient documentation

## 2020-04-05 DIAGNOSIS — Z7984 Long term (current) use of oral hypoglycemic drugs: Secondary | ICD-10-CM | POA: Diagnosis not present

## 2020-04-05 DIAGNOSIS — I5022 Chronic systolic (congestive) heart failure: Secondary | ICD-10-CM | POA: Diagnosis not present

## 2020-04-05 DIAGNOSIS — Z7982 Long term (current) use of aspirin: Secondary | ICD-10-CM | POA: Diagnosis not present

## 2020-04-05 DIAGNOSIS — I69351 Hemiplegia and hemiparesis following cerebral infarction affecting right dominant side: Secondary | ICD-10-CM | POA: Diagnosis not present

## 2020-04-05 DIAGNOSIS — Z7901 Long term (current) use of anticoagulants: Secondary | ICD-10-CM | POA: Diagnosis not present

## 2020-04-05 DIAGNOSIS — Z79899 Other long term (current) drug therapy: Secondary | ICD-10-CM | POA: Insufficient documentation

## 2020-04-05 DIAGNOSIS — I482 Chronic atrial fibrillation, unspecified: Secondary | ICD-10-CM | POA: Diagnosis not present

## 2020-04-05 DIAGNOSIS — I4819 Other persistent atrial fibrillation: Secondary | ICD-10-CM | POA: Diagnosis not present

## 2020-04-05 DIAGNOSIS — I43 Cardiomyopathy in diseases classified elsewhere: Secondary | ICD-10-CM | POA: Diagnosis not present

## 2020-04-05 DIAGNOSIS — I2510 Atherosclerotic heart disease of native coronary artery without angina pectoris: Secondary | ICD-10-CM | POA: Insufficient documentation

## 2020-04-05 DIAGNOSIS — I428 Other cardiomyopathies: Secondary | ICD-10-CM | POA: Insufficient documentation

## 2020-04-05 LAB — BASIC METABOLIC PANEL
Anion gap: 8 (ref 5–15)
BUN: 23 mg/dL (ref 8–23)
CO2: 28 mmol/L (ref 22–32)
Calcium: 9.6 mg/dL (ref 8.9–10.3)
Chloride: 97 mmol/L — ABNORMAL LOW (ref 98–111)
Creatinine, Ser: 1.28 mg/dL — ABNORMAL HIGH (ref 0.61–1.24)
GFR, Estimated: >60 mL/min (ref >60)
Glucose, Bld: 209 mg/dL — ABNORMAL HIGH (ref 70–99)
Potassium: 4.4 mmol/L (ref 3.5–5.1)
Sodium: 133 mmol/L — ABNORMAL LOW (ref 135–145)

## 2020-04-05 LAB — CBC
HCT: 40.6 % (ref 39.0–52.0)
Hemoglobin: 12.8 g/dL — ABNORMAL LOW (ref 13.0–17.0)
MCH: 29.9 pg (ref 26.0–34.0)
MCHC: 31.5 g/dL (ref 30.0–36.0)
MCV: 94.9 fL (ref 80.0–100.0)
Platelets: 152 10*3/uL (ref 150–400)
RBC: 4.28 MIL/uL (ref 4.22–5.81)
RDW: 15.5 % (ref 11.5–15.5)
WBC: 7 10*3/uL (ref 4.0–10.5)
nRBC: 0 % (ref 0.0–0.2)

## 2020-04-05 MED ORDER — TORSEMIDE 20 MG PO TABS: ORAL_TABLET | ORAL | 3 refills | Status: DC

## 2020-04-05 MED ORDER — TORSEMIDE 20 MG PO TABS: 20.0000 mg | ORAL_TABLET | Freq: Two times a day (BID) | ORAL | 3 refills | Status: DC

## 2020-04-05 NOTE — Patient Instructions (Addendum)
EKG done today.  Labs done today. We will contact you only if your labs are abnormal.  No medication changes were made. Please continue all current medications as prescribed.  Your physician recommends that you schedule a follow-up appointment in: 10 days for a lab only appointment and in 2 months for an appointment with Dr. Shirlee Latch with an echo prior to your exam.  Your physician has requested that you have an echocardiogram. Echocardiography is a painless test that uses sound waves to create images of your heart. It provides your doctor with information about the size and shape of your heart and how well your heart's chambers and valves are working. This procedure takes approximately one hour. There are no restrictions for this procedure.    If you have any questions or concerns before your next appointment please send [name] a message through [place] or call our office at [phone].    TO LEAVE A MESSAGE FOR THE NURSE SELECT OPTION 2, PLEASE LEAVE A MESSAGE INCLUDING: . YOUR NAME . DATE OF BIRTH . CALL BACK NUMBER . REASON FOR CALL**this is important as we prioritize the call backs  YOU WILL RECEIVE A CALL BACK THE SAME DAY AS LONG AS YOU CALL BEFORE 4:00 PM   Do the following things EVERYDAY: 1) Weigh yourself in the morning before breakfast. Write it down and keep it in a log. 2) Take your medicines as prescribed 3) Eat low salt foods--Limit salt (sodium) to 2000 mg per day.  4) Stay as active as you can everyday 5) Limit all fluids for the day to less than 2 liters   At the [facility], you and your health needs are our priority. As part of our continuing mission to provide you with exceptional heart care, we have created designated Provider Care Teams. These Care Teams include your primary Cardiologist (physician) and Advanced Practice Providers (APPs- Physician Assistants and Nurse Practitioners) who all work together to provide you with the care you need, when  you need it.   You may see any of the following providers on your designated Care Team at your next follow up: Marland Kitchen Dr Arvilla Meres . Dr Marca Ancona . Tonye Becket, NP . Robbie Lis, PA . Karle Plumber, PharmD   Please be sure to bring in all your medications bottles to every appointment.

## 2020-04-05 NOTE — Progress Notes (Signed)
ReDS Vest / Clip - 04/05/20 1100      ReDS Vest / Clip   Station Marker C    Ruler Value 26    ReDS Value Range Low volume    ReDS Actual Value 34

## 2020-04-05 NOTE — Progress Notes (Signed)
Advanced [facility] Failure Clinic Note   Referring Physician: PCP: Mirna Mires, MD PCP-Cardiologist: Verne Carrow, MD  HF Cardiology: Dr. Shirlee Latch   Reason for Visit: f/u for chronic systolic heart failure   HPI:  65 y.o. with history of chronic atrial fibrillation and end stage nonischemic cardiomyopathy returns for followup of CHF.   Patient had CVA in 2010, since that time has had expressive aphasia and right-sided weakness.  He lives with his brother.  In 8/18, he was admited with new atrial flutter and EF was found to be 20%.  He had cath in 8/18 showing mild nonobstructive coronary disease.  In 10/18, he had DCCV to NSR.  In 11/18 while in NSR, echo showed EF up to 45-50%.  By 2019, he was back in atrial fibrillation.  He was not cardioverted again. Echo in 4/21 showed EF 20-25%.    Patient was admitted in 10/21 with h/o increased dyspnea with exertion and developed abdominal swelling.  Failed outpatient diuretic titration. Admitted for IV diuretics. Echo repeated and EF was 20% with mod-severe RV dilation and moderate RV dysfunction, D-shaped septum, dilated IVC, severe biatrial enlargement.  He was in atrial fibrillation in 50s-60s. CT of abdomen showed large volume ascites, had paracentesis with 4.8 L out.  Cardiac MRI showed LV EF 24%, RV mildly dilated with EF 42%, extensive LGE in a non-coronary distribution throughout much of the LV, also involving RV free wall and left and right atrial walls, ECV 42%. This was suggestive of infiltrative disease. 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  He was initially on milrinone for low output HF, this was weaned off for DCCV which he failed. RHC 11/11/19 off milrinone showed optimized filling pressures but low cardiac output, milrinone restarted at 0.125. Discharged w/ home  milrinone + home health.  He remained in atrial fibrillation.  He was not thought to be a candidate for LVAD.   He returns for followup of end-stage CHF.  He is no longer being followed by hospice.  He seems to be doing well with milrinone.  Per his daughter, he is much more active now on milrinone than prior to the 10/21 admission. He walks daily with his cane for exercise.  He takes out the trash, washes dishes.  Walked in the office today without dyspnea.  Still using cane because of right-sided weakness. No falls, no lightheadedness/dizziness.  No chest pain.  Weight down by 1 lb.   REDS clip 34%  Labs (1/22): K 3.8, creatinine 1.2  ECG (personally reviewed): Atrial fibrillation with anterolateral TWIs.   Review of systems complete and found to be negative unless listed in HPI.    PMH: 1. 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  2. DM2 3. Atrial fibrillation: Chronic. Has failed DCCV and amiodarone.  4. Chronic systolic CHF: End-stage nonischemic cardiomyopathy.   - LHC (8/18): Mild nonobstructive CAD.  - RHC (11/21): mean RA 7, PA 29/9, mean PCWP 8, CI 1.6 (thermodilution)/2.5 (Fick) - Echo (11/21): EF 15-20%, D-shaped interventricular septum, severe dilated and severely dysfunctional RV, severe biatrial enlargement.  - Cardiac MRI (11/21): LV EF 24%, RV mildly dilated with EF 42%, extensive LGE in a non-coronary distribution throughout much of the LV, also involving RV free wall and left and right atrial walls, ECV 42%. This was suggestive of infiltrative disease. 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  - milrinone dependent   Current Outpatient Medications  Medication Sig Dispense Refill  . Accu-Chek FastClix Lancets MISC 1 each by Other route as directed.    Marland Kitchen ACCU-CHEK GUIDE test strip 1 each by Other route See admin instructions.    Marland Kitchen acetaminophen (TYLENOL) 500 MG tablet Take 1,000 mg by mouth every 6 (six) hours  as needed for pain.    Marland Kitchen apixaban (ELIQUIS) 5 MG TABS tablet TAKE 1 TABLET(5 MG) BY MOUTH TWICE DAILY 180 tablet 3  . aspirin EC 81 MG EC tablet Take 1 tablet (81 mg total) by mouth daily.    Marland Kitchen glimepiride (AMARYL) 1 MG tablet Take by mouth as needed.    . milrinone (PRIMACOR) 20 MG/100 ML SOLN infusion Inject 0.0139 mg/min into the vein continuous. 100 mL 0  . Multiple Vitamin (MULTIVITAMIN WITH MINERALS) TABS Take 1 tablet by mouth daily.    Marland Kitchen omega-3 acid ethyl esters (LOVAZA) 1 G capsule Take 1 g by mouth daily.    . simvastatin (ZOCOR) 40 MG tablet Take 40 mg by mouth daily.    Marland Kitchen spironolactone (ALDACTONE) 25 MG tablet TAKE 1 TABLET(25 MG) BY MOUTH DAILY 90 tablet 3  . torsemide (DEMADEX) 20 MG tablet Take 1 tablet (20 mg total) by mouth 2 (two) times daily. 180 tablet 3   No current facility-administered medications for this encounter.    No Known Allergies    Social History   Socioeconomic History  . Marital status: Single    Spouse name: Not on file  . Number of children: Not on file  . Years of education: Not on file  . Highest education level: Not on file  Occupational History  . Not on file  Tobacco Use  . Smoking status: Never Smoker  . Smokeless tobacco: Never Used  Vaping Use  . Vaping Use: Never used  Substance and Sexual Activity  . Alcohol use: No  . Drug use: No  . Sexual activity: Not Currently  Other Topics Concern  . Not on file  Social History Narrative  . Not on file   Social Determinants of Health   Financial Resource Strain: Not on file  Food Insecurity: Not on file  Transportation Needs: Not on file  Physical Activity: Not on file  Stress: Not on file  Social Connections: Not on file  Intimate Partner Violence: Not on file      Family History  Problem Relation Age of Onset  . Hypertension Mother   . Diabetes Mother   . Heart disease Father     Vitals:   04/05/20 1027  BP: (!) 90/50  Pulse: 60  SpO2: 100%  Weight: 60.9 kg (134  lb 3.2 oz)     PHYSICAL EXAM: General: NAD Neck: JVP 8 cm, no thyromegaly or thyroid nodule.  Lungs: Mild crackles at bases CV: Lateral PMI.  Heart irregular S1/S2, no S3/S4, no murmur.  1+ ankle edema.  No carotid bruit.  Normal pedal pulses.  Abdomen: Soft, nontender, no hepatosplenomegaly, no distention.  Skin: Intact without lesions or rashes.  Neurologic: Alert and oriented x 3.  Psych: Normal affect. Extremities: No clubbing or cyanosis.  HEENT: Normal.   ASSESSMENT & PLAN:  1. Chronic systolic CHF: Nonischemic cardiomyopathy known since 2018. Improved initially in 2018 with DCCV, but has been back in atrial fibrillation since 2019 it appears and EF has been low. Admission 10/21 for a/c HF w/ low output  requiring milrinone. Echo showed EF 15-20% with mod-severe RV dilation and moderate RV dysfunction, D-shaped septum, dilated IVC, severe biatrial enlargement. Significant RV failure with ascites on CT abdomen, had paracentesis.  Cardiac MRI showed LV EF 24%, RV mildly dilated with EF 42%, extensive LGE in a non-coronary distribution throughout much of the LV, also involving RV free wall and left and right atrial walls, ECV 42%. This was suggestive of infiltrative disease. 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  He was initially on milrinone for low output HF, this was weaned off for DCCV which he failed. RHC 11/4 off milrinone showed optimized filling pressures but low cardiac output, milrinone restarted and has been continued since that time. He was sent home in 11/21 on hospice but hospice has released him.  On exam today, he is not volume overloaded with REDS clip 34%.  NYHA class II-III.  - Continue home milrinone.   - Continue torsemide 20 mg bid.  BMET today.   - Continue Spiro 25 mg daily  - BP too low for  ARB/ARNi - No digoxin with history of bradycardia.   - Narrow QRS, not CRT candidate.  - Not a transplant candidate and not a good candidate for LVAD. Patient assessed by our LVAD nurse, she had significant concerns about his right arm/hand weakness. He would be unable to manage his LVAD equipment and would need consistent 24 hr caregiver (not available).  - Send [facility] gene testing to assess for hereditary transthyretin amyloidosis.   - Not started on tafamidis as cardiac amyloidosis is thought to be too advanced.   - Repeat echo at followup in 2 months.  2. Atrial fibrillation: Now chronic. Initially in 2018, EF improved after DCCV. However, as above, he has been relatively well rate controlled and has not been in RVR, so do not think that cardiomyopathy is primarily due to AF. He failed DCCV on amiodarone 10/21, amiodarone stopped due to bradycardia.  - Continue Eliquis. CBC today.   - D/c aspirin given Eliquis use.  3. Ascites:Suspect due to RV failure. S/p Paracentesis on 10/31 with 4.8 L out. No evidence of recurrence on exam today  4. H/o CVA: In 2010, with chronic expressive aphasia and right-sided weakness.   Followup in 2 months with echo.   Marca Ancona, MD 04/05/20

## 2020-04-17 ENCOUNTER — Ambulatory Visit (HOSPITAL_COMMUNITY)
Admission: RE | Admit: 2020-04-17 | Discharge: 2020-04-17 | Disposition: A | Discharge status: Home / Self Care | Payer: Medicaid Other | Source: Ambulatory Visit | Attending: Internal Medicine | Admitting: Internal Medicine

## 2020-04-17 ENCOUNTER — Other Ambulatory Visit: Payer: Self-pay

## 2020-04-17 DIAGNOSIS — I5022 Chronic systolic (congestive) heart failure: Secondary | ICD-10-CM

## 2020-04-17 LAB — BASIC METABOLIC PANEL
Anion gap: 9 (ref 5–15)
BUN: 21 mg/dL (ref 8–23)
CO2: 27 mmol/L (ref 22–32)
Calcium: 9.7 mg/dL (ref 8.9–10.3)
Chloride: 98 mmol/L (ref 98–111)
Creatinine, Ser: 1.27 mg/dL — ABNORMAL HIGH (ref 0.61–1.24)
GFR, Estimated: >60 mL/min (ref >60)
Glucose, Bld: 156 mg/dL — ABNORMAL HIGH (ref 70–99)
Potassium: 4.2 mmol/L (ref 3.5–5.1)
Sodium: 134 mmol/L — ABNORMAL LOW (ref 135–145)

## 2020-06-09 ENCOUNTER — Ambulatory Visit (HOSPITAL_BASED_OUTPATIENT_CLINIC_OR_DEPARTMENT_OTHER)
Admission: RE | Admit: 2020-06-09 | Discharge: 2020-06-09 | Disposition: A | Discharge status: Home / Self Care | Payer: Medicaid Other | Source: Ambulatory Visit | Attending: Cardiology | Admitting: Cardiology

## 2020-06-09 ENCOUNTER — Ambulatory Visit (HOSPITAL_COMMUNITY)
Admission: RE | Admit: 2020-06-09 | Discharge: 2020-06-09 | Disposition: A | Discharge status: Home / Self Care | Payer: Medicaid Other | Source: Ambulatory Visit | Attending: Cardiology | Admitting: Cardiology

## 2020-06-09 ENCOUNTER — Encounter (HOSPITAL_COMMUNITY): Payer: Self-pay | Admitting: Cardiology

## 2020-06-09 ENCOUNTER — Telehealth (HOSPITAL_COMMUNITY): Payer: Self-pay | Admitting: *Deleted

## 2020-06-09 ENCOUNTER — Other Ambulatory Visit: Payer: Self-pay

## 2020-06-09 VITALS — BP 98/62 | HR 51 | Ht 67.0 in | Wt 134.4 lb

## 2020-06-09 DIAGNOSIS — I5084 End stage heart failure: Secondary | ICD-10-CM | POA: Diagnosis not present

## 2020-06-09 DIAGNOSIS — I69351 Hemiplegia and hemiparesis following cerebral infarction affecting right dominant side: Secondary | ICD-10-CM | POA: Insufficient documentation

## 2020-06-09 DIAGNOSIS — I4819 Other persistent atrial fibrillation: Secondary | ICD-10-CM

## 2020-06-09 DIAGNOSIS — I482 Chronic atrial fibrillation, unspecified: Secondary | ICD-10-CM | POA: Insufficient documentation

## 2020-06-09 DIAGNOSIS — Z8249 Family history of ischemic heart disease and other diseases of the circulatory system: Secondary | ICD-10-CM | POA: Insufficient documentation

## 2020-06-09 DIAGNOSIS — I5022 Chronic systolic (congestive) heart failure: Secondary | ICD-10-CM | POA: Insufficient documentation

## 2020-06-09 DIAGNOSIS — I43 Cardiomyopathy in diseases classified elsewhere: Secondary | ICD-10-CM | POA: Insufficient documentation

## 2020-06-09 DIAGNOSIS — Z7901 Long term (current) use of anticoagulants: Secondary | ICD-10-CM | POA: Insufficient documentation

## 2020-06-09 DIAGNOSIS — Z7984 Long term (current) use of oral hypoglycemic drugs: Secondary | ICD-10-CM | POA: Insufficient documentation

## 2020-06-09 DIAGNOSIS — E854 Organ-limited amyloidosis: Secondary | ICD-10-CM | POA: Insufficient documentation

## 2020-06-09 DIAGNOSIS — R188 Other ascites: Secondary | ICD-10-CM | POA: Diagnosis not present

## 2020-06-09 DIAGNOSIS — I428 Other cardiomyopathies: Secondary | ICD-10-CM | POA: Diagnosis not present

## 2020-06-09 DIAGNOSIS — I69320 Aphasia following cerebral infarction: Secondary | ICD-10-CM | POA: Diagnosis not present

## 2020-06-09 DIAGNOSIS — Z79899 Other long term (current) drug therapy: Secondary | ICD-10-CM | POA: Diagnosis not present

## 2020-06-09 DIAGNOSIS — I081 Rheumatic disorders of both mitral and tricuspid valves: Secondary | ICD-10-CM | POA: Diagnosis not present

## 2020-06-09 DIAGNOSIS — E119 Type 2 diabetes mellitus without complications: Secondary | ICD-10-CM | POA: Diagnosis not present

## 2020-06-09 LAB — CBC
HCT: 39.4 % (ref 39.0–52.0)
Hemoglobin: 12.7 g/dL — ABNORMAL LOW (ref 13.0–17.0)
MCH: 30.2 pg (ref 26.0–34.0)
MCHC: 32.2 g/dL (ref 30.0–36.0)
MCV: 93.6 fL (ref 80.0–100.0)
Platelets: 183 10*3/uL (ref 150–400)
RBC: 4.21 MIL/uL — ABNORMAL LOW (ref 4.22–5.81)
RDW: 14.4 % (ref 11.5–15.5)
WBC: 6.8 10*3/uL (ref 4.0–10.5)
nRBC: 0 % (ref 0.0–0.2)

## 2020-06-09 LAB — BASIC METABOLIC PANEL
Anion gap: 9 (ref 5–15)
BUN: 24 mg/dL — ABNORMAL HIGH (ref 8–23)
CO2: 26 mmol/L (ref 22–32)
Calcium: 9.8 mg/dL (ref 8.9–10.3)
Chloride: 99 mmol/L (ref 98–111)
Creatinine, Ser: 1.27 mg/dL — ABNORMAL HIGH (ref 0.61–1.24)
GFR, Estimated: >60 mL/min (ref >60)
Glucose, Bld: 162 mg/dL — ABNORMAL HIGH (ref 70–99)
Potassium: 4.1 mmol/L (ref 3.5–5.1)
Sodium: 134 mmol/L — ABNORMAL LOW (ref 135–145)

## 2020-06-09 LAB — ECHOCARDIOGRAM COMPLETE
Area-P 1/2: 3.35 cm2
S' Lateral: 4.2 cm

## 2020-06-09 MED ORDER — TORSEMIDE 20 MG PO TABS: ORAL_TABLET | ORAL | 3 refills | Status: DC

## 2020-06-09 MED ORDER — POTASSIUM CHLORIDE CRYS ER 20 MEQ PO TBCR: 20.0000 meq | EXTENDED_RELEASE_TABLET | Freq: Every day | ORAL | 3 refills | Status: DC

## 2020-06-09 NOTE — Patient Instructions (Addendum)
Stop Aspirin  Increase Torsemide to 40 mg (2 tabs) in AM and 20 mg (1 tab) in PM  Start Potassium 20 meq Daily  Labs done today, we will call you for abnormal results   Your physician recommends that you return for lab work in: 1-2 weeks  Genetic test has been done, this has to be sent to [place] to be processed and can take 1-2 weeks to get results back.  We will let you know the results.  Your physician recommends that you schedule a follow-up appointment in: 6 weeks  If you have any questions or concerns before your next appointment please send [name] a message through [place] or call our office at [phone].    TO LEAVE A MESSAGE FOR THE NURSE SELECT OPTION 2, PLEASE LEAVE A MESSAGE INCLUDING: . YOUR NAME . DATE OF BIRTH . CALL BACK NUMBER . REASON FOR CALL**this is important as we prioritize the call backs  YOU WILL RECEIVE A CALL BACK THE SAME DAY AS LONG AS YOU CALL BEFORE 4:00 PM  At the [facility], you and your health needs are our priority. As part of our continuing mission to provide you with exceptional heart care, we have created designated Provider Care Teams. These Care Teams include your primary Cardiologist (physician) and Advanced Practice Providers (APPs- Physician Assistants and Nurse Practitioners) who all work together to provide you with the care you need, when you need it.   You may see any of the following providers on your designated Care Team at your next follow up: Marland Kitchen Dr Arvilla Meres . Dr Marca Ancona . Dr Thornell Mule . Tonye Becket, NP . Robbie Lis, PA . Shanda Bumps [place],NP . Karle Plumber, PharmD   Please be sure to bring in all your medications bottles to every appointment.

## 2020-06-09 NOTE — Progress Notes (Signed)
Blood collected for TTR genetic testing per Dr McLean.  Order form completed and both shipped by [employer] to [facility].  

## 2020-06-09 NOTE — Progress Notes (Signed)
  Echocardiogram 2D Echocardiogram with 3D and strain has been performed.  Leta Jungling M 06/09/2020, 10:41 AM

## 2020-06-09 NOTE — Telephone Encounter (Signed)
Pts niece [name] left a vm requesting a return call about pts visit today. She had specific questions about why we are running genetic testing again and questions about medication changes.   Routed to [employer]

## 2020-06-10 NOTE — Progress Notes (Signed)
Advanced Heart Failure Clinic Note   Referring Physician: PCP: Mirna Mires, MD PCP-Cardiologist: Verne Carrow, MD  HF Cardiology: Dr. Shirlee Latch   Reason for Visit: f/u for chronic systolic heart failure   HPI:  65 y.o. with history of chronic atrial fibrillation and end stage nonischemic cardiomyopathy returns for followup of CHF.   Patient had CVA in 2010, since that time has had expressive aphasia and right-sided weakness.  He lives with his brother.  In 8/18, he was admited with new atrial flutter and EF was found to be 20%.  He had cath in 8/18 showing mild nonobstructive coronary disease.  In 10/18, he had DCCV to NSR.  In 11/18 while in NSR, echo showed EF up to 45-50%.  By 2019, he was back in atrial fibrillation.  He was not cardioverted again. Echo in 4/21 showed EF 20-25%.    Patient was admitted in 10/21 with h/o increased dyspnea with exertion and developed abdominal swelling.  Failed outpatient diuretic titration. Admitted for IV diuretics. Echo repeated and EF was 20% with mod-severe RV dilation and moderate RV dysfunction, D-shaped septum, dilated IVC, severe biatrial enlargement.  He was in atrial fibrillation in 50s-60s. CT of abdomen showed large volume ascites, had paracentesis with 4.8 L out.  Cardiac MRI showed LV EF 24%, RV mildly dilated with EF 42%, extensive LGE in a non-coronary distribution throughout much of the LV, also involving RV free wall and left and right atrial walls, ECV 42%. This was suggestive of infiltrative disease. 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  He was initially on milrinone for low output HF, this was weaned off for DCCV which he failed. RHC 11/11/19 off milrinone showed optimized filling pressures but low cardiac output, milrinone restarted at 0.125. Discharged w/ home  milrinone + home health.  He remained in atrial fibrillation.  He was not thought to be a candidate for LVAD.   Echo was done today and reviewed, EF 25-30%, mild LV dilation, mild LVH, moderately decreased RV systolic function, IVC dilated, PASP 49 mmHg.   He returns for followup of end-stage CHF.  Weight is stable.  History comes from brother because of aphasia.  He seems to be doing ok.  No falls or loss of consciousness. He does not seem short of breath.  No chest pain.    Labs (1/22): K 3.8, creatinine 1.2 Labs (4/22): K 4.2, creatinine 1.27  ECG (personally reviewed): Atrial flutter rate 62  Review of systems complete and found to be negative unless listed in HPI.    PMH: 1. 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  2. DM2 3. Atrial fibrillation: Chronic. Has failed DCCV and amiodarone.  4. Chronic systolic CHF: End-stage nonischemic cardiomyopathy.   - LHC (8/18): Mild nonobstructive CAD.  - RHC (11/21): mean RA 7, PA 29/9, mean PCWP 8, CI 1.6 (thermodilution)/2.5 (Fick) - Echo (11/21): EF 15-20%, D-shaped interventricular septum, severe dilated and severely dysfunctional RV, severe biatrial enlargement.  - Cardiac MRI (11/21): LV EF 24%, RV mildly dilated with EF 42%, extensive LGE in a non-coronary distribution throughout much of the LV, also involving RV free wall and left and right atrial walls, ECV 42%. This was suggestive of infiltrative disease. 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  - milrinone dependent   Current Outpatient Medications  Medication Sig Dispense Refill  . Accu-Chek FastClix Lancets MISC 1 each by Other route as directed.    Marland Kitchen ACCU-CHEK GUIDE test strip 1 each by Other route See admin instructions.    Marland Kitchen acetaminophen (TYLENOL) 500 MG tablet Take 1,000 mg by mouth every 6 (six) hours as needed for pain.    Marland Kitchen apixaban (ELIQUIS) 5 MG TABS tablet TAKE 1 TABLET(5 MG) BY MOUTH TWICE DAILY 180 tablet  3  . milrinone (PRIMACOR) 20 MG/100 ML SOLN infusion Inject 0.0139 mg/min into the vein continuous. 100 mL 0  . Multiple Vitamin (MULTIVITAMIN WITH MINERALS) TABS Take 1 tablet by mouth daily.    Marland Kitchen omega-3 acid ethyl esters (LOVAZA) 1 G capsule Take 1 g by mouth daily.    . potassium chloride SA (KLOR-CON) 20 MEQ tablet Take 1 tablet (20 mEq total) by mouth daily. 90 tablet 3  . simvastatin (ZOCOR) 40 MG tablet Take 40 mg by mouth daily.    Marland Kitchen spironolactone (ALDACTONE) 25 MG tablet TAKE 1 TABLET(25 MG) BY MOUTH DAILY 90 tablet 3  . TRADJENTA 5 MG TABS tablet Take 5 mg by mouth every morning.    . torsemide (DEMADEX) 20 MG tablet Take 2 tablets (40 mg total) by mouth in the morning AND 1 tablet (20 mg total) every evening. 270 tablet 3   No current facility-administered medications for this encounter.    No Known Allergies    Social History   Socioeconomic History  . Marital status: Single    Spouse name: Not on file  . Number of children: Not on file  . Years of education: Not on file  . Highest education level: Not on file  Occupational History  . Not on file  Tobacco Use  . Smoking status: Never Smoker  . Smokeless tobacco: Never Used  Vaping Use  . Vaping Use: Never used  Substance and Sexual Activity  . Alcohol use: No  . Drug use: No  . Sexual activity: Not Currently  Other Topics Concern  . Not on file  Social History Narrative  . Not on file   Social Determinants of Health   Financial Resource Strain: Not on file  Food Insecurity: Not on file  Transportation Needs: Not on file  Physical Activity: Not on file  Stress: Not on file  Social Connections: Not on file  Intimate Partner Violence: Not on file      Family History  Problem Relation Age of Onset  . Hypertension Mother   . Diabetes Mother   . Heart disease Father     Vitals:   06/09/20 1053  BP: 98/62  Pulse: (!) 51  SpO2: 98%  Weight: 61 kg (134 lb 6.4 oz)  Height: 5\' 7"  (1.702 m)      PHYSICAL EXAM: General: NAD Neck: JVP 8-9 cm, no thyromegaly or thyroid nodule.  Lungs: Clear to auscultation bilaterally with normal respiratory effort. CV: Nondisplaced PMI.  Heart irregular S1/S2, no S3/S4, no murmur.  No peripheral edema.  No carotid bruit.  Normal pedal pulses.  Abdomen: Soft, nontender, no hepatosplenomegaly, no distention.  Skin: Intact without lesions or rashes.  Neurologic: Alert and oriented x 3.  Psych: Normal affect. Extremities: No clubbing or cyanosis.  HEENT: Normal.   ASSESSMENT & PLAN:  1. Chronic systolic CHF: Nonischemic cardiomyopathy known since 2018. Improved initially in 2018 with DCCV, but has been back in atrial fibrillation since 2019 it appears and EF has been low. Admission 10/21 for a/c HF w/ low  output requiring milrinone. Echo showed EF 15-20% with mod-severe RV dilation and moderate RV dysfunction, D-shaped septum, dilated IVC, severe biatrial enlargement. Significant RV failure with ascites on CT abdomen, had paracentesis.  Cardiac MRI showed LV EF 24%, RV mildly dilated with EF 42%, extensive LGE in a non-coronary distribution throughout much of the LV, also involving RV free wall and left and right atrial walls, ECV 42%. This was suggestive of infiltrative disease. 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  He was initially on milrinone for low output HF, this was weaned off for DCCV which he failed. RHC 11/4 off milrinone showed optimized filling pressures but low cardiac output, milrinone restarted and has been continued since that time. He was sent home in 11/21 on hospice but hospice has released him. Echo today with EF 25-30%, moderately decreased RV function, dilated IVC.  On exam today, he is volume overloaded.  NYHA class II-III.  - Continue home milrinone.   -  Increase torsemide to 40 qam/20 qpm and add KCl 20.  BMET today and 10 days.    - Continue Spiro 25 mg daily  - BP too low for ARB/ARNi - No digoxin with history of bradycardia.   - Narrow QRS, not CRT candidate.  - Not a transplant candidate and not a good candidate for LVAD. Patient assessed by our LVAD nurse, she had significant concerns about his right arm/hand weakness. He would be unable to manage his LVAD equipment and would need consistent 24 hr caregiver (not available).  - Send [facility] gene testing to assess for hereditary transthyretin amyloidosis.   - Not started on tafamidis as cardiac amyloidosis is thought to be too advanced at this point.    2. Atrial fibrillation: Now chronic. Initially in 2018, EF improved after DCCV. However, as above, he has been relatively well rate controlled and has not been in RVR, so do not think that cardiomyopathy is primarily due to AF. He failed DCCV on amiodarone 10/21, amiodarone stopped due to bradycardia.  - Continue Eliquis. CBC today.   3. Ascites:Suspect due to RV failure. S/p Paracentesis on 10/31 with 4.8 L out. No evidence of recurrence on exam today  4. H/o CVA: In 2010, with chronic expressive aphasia and right-sided weakness.   Followup NP/PA in 6 weeks.   Marca Ancona, MD 06/10/20

## 2020-06-14 ENCOUNTER — Ambulatory Visit: Payer: Medicaid Other | Admitting: Podiatry

## 2020-06-19 ENCOUNTER — Other Ambulatory Visit (HOSPITAL_COMMUNITY): Payer: Medicaid Other

## 2020-06-20 ENCOUNTER — Other Ambulatory Visit: Payer: Self-pay

## 2020-06-20 ENCOUNTER — Ambulatory Visit (HOSPITAL_COMMUNITY)
Admission: RE | Admit: 2020-06-20 | Discharge: 2020-06-20 | Disposition: A | Discharge status: Home / Self Care | Payer: Medicaid Other | Source: Ambulatory Visit | Attending: Cardiology | Admitting: Cardiology

## 2020-06-20 DIAGNOSIS — I5022 Chronic systolic (congestive) heart failure: Secondary | ICD-10-CM | POA: Insufficient documentation

## 2020-06-20 LAB — BASIC METABOLIC PANEL
Anion gap: 9 (ref 5–15)
BUN: 26 mg/dL — ABNORMAL HIGH (ref 8–23)
CO2: 26 mmol/L (ref 22–32)
Calcium: 9.9 mg/dL (ref 8.9–10.3)
Chloride: 96 mmol/L — ABNORMAL LOW (ref 98–111)
Creatinine, Ser: 1.5 mg/dL — ABNORMAL HIGH (ref 0.61–1.24)
GFR, Estimated: 52 mL/min — ABNORMAL LOW (ref >60)
Glucose, Bld: 204 mg/dL — ABNORMAL HIGH (ref 70–99)
Potassium: 4.4 mmol/L (ref 3.5–5.1)
Sodium: 131 mmol/L — ABNORMAL LOW (ref 135–145)

## 2020-06-30 ENCOUNTER — Encounter: Payer: Self-pay | Admitting: Podiatry

## 2020-06-30 ENCOUNTER — Ambulatory Visit (INDEPENDENT_AMBULATORY_CARE_PROVIDER_SITE_OTHER): Payer: Medicaid Other | Admitting: Podiatry

## 2020-06-30 ENCOUNTER — Other Ambulatory Visit: Payer: Self-pay

## 2020-06-30 DIAGNOSIS — R0989 Other specified symptoms and signs involving the circulatory and respiratory systems: Secondary | ICD-10-CM

## 2020-06-30 DIAGNOSIS — E1151 Type 2 diabetes mellitus with diabetic peripheral angiopathy without gangrene: Secondary | ICD-10-CM | POA: Diagnosis not present

## 2020-06-30 DIAGNOSIS — B351 Tinea unguium: Secondary | ICD-10-CM | POA: Diagnosis not present

## 2020-06-30 DIAGNOSIS — M79675 Pain in left toe(s): Secondary | ICD-10-CM | POA: Diagnosis not present

## 2020-06-30 DIAGNOSIS — M79674 Pain in right toe(s): Secondary | ICD-10-CM

## 2020-06-30 NOTE — Progress Notes (Signed)
This patient returns to my office for at risk foot care.  This patient requires this care by a professional since this patient will be at risk due to having type 2 diabetes.  This patient is unable to cut nails himself since the patient cannot reach his nails.These nails are painful walking and wearing shoes. He presents with male caregiver. This patient presents for at risk foot care today.  General Appearance  Alert, conversant and in no acute stress.  Vascular  Dorsalis pedis and posterior tibial  pulses are  weakly palpable  right.  Absent dorsalis pedis and posterior tibial pulses  left.  Capillary return is within normal limits  bilaterally. Temperature is within normal limits  bilaterally.  Neurologic  Senn-Weinstein monofilament wire test within normal limits  bilaterally. Muscle power within normal limits bilaterally.  Nails Thick disfigured discolored nails with subungual debris  from hallux to fifth toes bilaterally. No evidence of bacterial infection or drainage bilaterally.  Orthopedic  No limitations of motion  feet .  No crepitus or effusions noted.  No bony pathology or digital deformities noted.  Skin  normotropic skin with no porokeratosis noted bilaterally.  No signs of infections or ulcers noted.     Onychomycosis  Pain in right toes  Pain in left toes  Consent was obtained for treatment procedures.   Mechanical debridement of nails 1-5  bilaterally performed with a nail nipper.  Filed with dremel without incident.    Return office visit    3 months                  Told patient to return for periodic foot care and evaluation due to potential at risk complications.   Gregory Mayer DPM    

## 2020-07-19 NOTE — Progress Notes (Signed)
[facility] Note   PCP: Mirna Mires, MD PCP-Cardiologist: Verne Carrow, MD  HF Cardiology: Dr. Shirlee Latch   Reason for Visit: f/u for chronic systolic heart failure   HPI:  65 y.o. with history of chronic atrial fibrillation and end stage nonischemic cardiomyopathy returns for followup of CHF.   Patient had CVA in 2010, since that time has had expressive aphasia and right-sided weakness.  He lives with his brother.  In 8/18, he was admited with new atrial flutter and EF was found to be 20%.  He had cath in 8/18 showing mild nonobstructive coronary disease.  In 10/18, he had DCCV to NSR.  In 11/18 while in NSR, echo showed EF up to 45-50%.  By 2019, he was back in atrial fibrillation.  He was not cardioverted again. Echo in 4/21 showed EF 20-25%.     Patient was admitted in 10/21 with h/o increased dyspnea with exertion and developed abdominal swelling.  Failed outpatient diuretic titration. Admitted for IV diuretics. Echo repeated and EF was 20% with mod-severe RV dilation and moderate RV dysfunction, D-shaped septum, dilated IVC, severe biatrial enlargement.  He was in atrial fibrillation in 50s-60s. CT of abdomen showed large volume ascites, had paracentesis with 4.8 L out.  Cardiac MRI showed LV EF 24%, RV mildly dilated with EF 42%, extensive LGE in a non-coronary distribution throughout much of the LV, also involving RV free wall and left and right atrial walls, ECV 42%. This was suggestive of infiltrative disease. 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  He was initially on milrinone for low output HF, this was weaned off for DCCV which he failed. RHC 11/11/19 off milrinone showed optimized filling pressures but low cardiac output, milrinone restarted at 0.125. Discharged w/ home milrinone + home health.   He remained in atrial fibrillation.  He was not thought to be a candidate for LVAD.   Echo was done (6/22) and reviewed, EF 25-30%, mild LV dilation, mild LVH, moderately decreased RV systolic function, IVC dilated, PASP 49 mmHg.   He returned 6/22 for followup of end-stage CHF.  History comes from brother because of aphasia.  Seemed to be doing ok.  No falls or loss of consciousness. He does not seem short of breath.  He was mildly volume overloaded, torsemide increased. NYHA II-III.  Today he returns for HF follow up with his niece. Overall feeling fine. He does not appear to be SOB. Denies increasing SOB, CP, dizziness, edema, or PND/Orthopnea. Weight at home stable. Taking all medications. No falls or loss of consciousness. Using a walker.  Labs (1/22): K 3.8, creatinine 1.2 Labs (4/22): K 4.2, creatinine 1.27 Labs (6/22): K 4.4, creatinine 1.5  Review of systems complete and found to be negative unless listed in HPI.    PMH: 1. 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  2. DM2 3. Atrial fibrillation: Chronic. Has failed DCCV and amiodarone.  4. Chronic systolic CHF: End-stage nonischemic cardiomyopathy.   - LHC (8/18): Mild nonobstructive CAD.  - RHC (11/21): mean RA 7, PA 29/9, mean PCWP 8, CI 1.6 (thermodilution)/2.5 (Fick) - Echo (11/21): EF 15-20%, D-shaped interventricular septum, severe dilated and severely dysfunctional RV, severe biatrial enlargement.  - Cardiac MRI (11/21): LV EF 24%, RV mildly dilated with EF 42%, extensive LGE in a non-coronary distribution throughout much of the LV,  also involving RV free wall and left and right atrial walls, ECV 42%. This was suggestive of infiltrative disease. 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  - milrinone dependent  Current Outpatient Medications  Medication Sig Dispense Refill   Accu-Chek FastClix Lancets MISC 1 each by Other route as directed.     ACCU-CHEK GUIDE  test strip 1 each by Other route See admin instructions.     acetaminophen (TYLENOL) 500 MG tablet Take 1,000 mg by mouth every 6 (six) hours as needed for pain.     apixaban (ELIQUIS) 5 MG TABS tablet TAKE 1 TABLET(5 MG) BY MOUTH TWICE DAILY 180 tablet 3   milrinone (PRIMACOR) 20 MG/100 ML SOLN infusion Inject 0.0139 mg/min into the vein continuous. 100 mL 0   Multiple Vitamin (MULTIVITAMIN WITH MINERALS) TABS Take 1 tablet by mouth daily.     omega-3 acid ethyl esters (LOVAZA) 1 G capsule Take 1 g by mouth daily.     potassium chloride SA (KLOR-CON) 20 MEQ tablet Take 1 tablet (20 mEq total) by mouth daily. 90 tablet 3   simvastatin (ZOCOR) 40 MG tablet Take 40 mg by mouth daily.     spironolactone (ALDACTONE) 25 MG tablet TAKE 1 TABLET(25 MG) BY MOUTH DAILY 90 tablet 3   torsemide (DEMADEX) 20 MG tablet Take 2 tablets (40 mg total) by mouth in the morning AND 1 tablet (20 mg total) every evening. 270 tablet 3   TRADJENTA 5 MG TABS tablet Take 5 mg by mouth every morning.     No current facility-administered medications for this encounter.   No Known Allergies  Social History   Socioeconomic History   Marital status: Single    Spouse name: Not on file   Number of children: Not on file   Years of education: Not on file   Highest education level: Not on file  Occupational History   Not on file  Tobacco Use   Smoking status: Never   Smokeless tobacco: Never  Vaping Use   Vaping Use: Never used  Substance and Sexual Activity   Alcohol use: No   Drug use: No   Sexual activity: Not Currently  Other Topics Concern   Not on file  Social History Narrative   Not on file   Social Determinants of Health   Financial Resource Strain: Not on file  Food Insecurity: Not on file  Transportation Needs: Not on file  Physical Activity: Not on file  Stress: Not on file  Social Connections: Not on file  Intimate Partner Violence: Not on file   Family History  Problem Relation Age of  Onset   Hypertension Mother    Diabetes Mother    Heart disease Father    BP 99/64   Pulse 77   Wt 61.8 kg (136 lb 3.2 oz)   SpO2 100%   BMI 21.33 kg/m   Wt Readings from Last 3 Encounters:  07/21/20 61.8 kg (136 lb 3.2 oz)  06/09/20 61 kg (134 lb 6.4 oz)  04/05/20 60.9 kg (134 lb 3.2 oz)   PHYSICAL EXAM: General:  NAD. No resp difficulty, ambulates with walker HEENT: Normal Neck: Supple. No JVD. Carotids 2+ bilat; no bruits. No lymphadenopathy or thryomegaly appreciated. Cor: PMI nondisplaced. Irregular rate & rhythm. No rubs, gallops or murmurs. Lungs: Clear, diminished in RLL. Abdomen: Soft, nontender, nondistended. No hepatosplenomegaly. No bruits or masses. Good bowel sounds. Extremities: No cyanosis, clubbing, rash, edema Neuro: Alert &  oriented x 3, expressive aphasia. Right-sided hemiparesis. Affect pleasant.  ASSESSMENT & PLAN:  1. Chronic systolic CHF: Nonischemic cardiomyopathy known since 2018.  Improved initially in 2018 with DCCV, but has been back in atrial fibrillation since 2019 it appears and EF has been low. Admission 10/21 for a/c HF w/ low output requiring milrinone. Echo showed EF 15-20% with mod-severe RV dilation and moderate RV dysfunction, D-shaped septum, dilated IVC, severe biatrial enlargement. Significant RV failure with ascites on CT abdomen, had paracentesis.  Cardiac MRI showed LV EF 24%, RV mildly dilated with EF 42%, extensive LGE in a non-coronary distribution throughout much of the LV, also involving RV free wall and left and right atrial walls, ECV 42%. This was suggestive of infiltrative disease. 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  He was initially on milrinone for low output HF, this was weaned off for DCCV which he failed. RHC 11/4 off milrinone showed optimized filling  pressures but low cardiac output, milrinone restarted and has been continued since that time. He was sent home in 11/21 on hospice but hospice has released him. Echo (6/22) with EF 25-30%, moderately decreased RV function, dilated IVC.  He is not volume overloaded on exam, weight stable.  NYHA class II-III.  - Continue home milrinone.   - Continue torsemide 40 qam/20 qpm + KCl 20.  BMET today. - Continue spironolactone 25 mg daily  - BP too low for ARB/ARNi. - No digoxin with history of bradycardia.   - Narrow QRS, not CRT candidate.  - Not a transplant candidate and not a good candidate for LVAD. Patient assessed by our LVAD nurse, she had significant concerns about his right arm/hand weakness.  He would be unable to manage his LVAD equipment and would need consistent 24 hr caregiver (not available).   - [employer] gene testing sent to assess for hereditary transthyretin amyloidosis.   - Not started on tafamidis as cardiac amyloidosis is thought to be too advanced at this point.    2. Atrial fibrillation: Now chronic.  Initially in 2018, EF improved after DCCV.  However, as above, he has been relatively well rate controlled and has not been in RVR, so do not think that cardiomyopathy is primarily due to AF.  He failed DCCV on amiodarone 10/21, amiodarone stopped due to bradycardia.  - Continue Eliquis. Recent CBC stable. 3. Ascites: Suspect due to RV failure. S/p Paracentesis on 10/31 with 4.8 L out. No evidence of recurrence on exam today  4. H/o CVA: In 2010, with chronic expressive aphasia and right-sided weakness.   Followup in 3 months with Dr. Shirlee Latch.  Anderson Malta Landess, FNP 07/21/20

## 2020-07-21 ENCOUNTER — Encounter (HOSPITAL_COMMUNITY): Payer: Self-pay

## 2020-07-21 ENCOUNTER — Other Ambulatory Visit: Payer: Self-pay

## 2020-07-21 ENCOUNTER — Ambulatory Visit (HOSPITAL_COMMUNITY)
Admission: RE | Admit: 2020-07-21 | Discharge: 2020-07-21 | Disposition: A | Discharge status: Home / Self Care | Payer: Medicaid Other | Source: Ambulatory Visit | Attending: Family Medicine | Admitting: Family Medicine

## 2020-07-21 VITALS — BP 99/64 | HR 77 | Wt 136.2 lb

## 2020-07-21 DIAGNOSIS — I4819 Other persistent atrial fibrillation: Secondary | ICD-10-CM

## 2020-07-21 DIAGNOSIS — Z79899 Other long term (current) drug therapy: Secondary | ICD-10-CM | POA: Insufficient documentation

## 2020-07-21 DIAGNOSIS — E119 Type 2 diabetes mellitus without complications: Secondary | ICD-10-CM | POA: Diagnosis not present

## 2020-07-21 DIAGNOSIS — R188 Other ascites: Secondary | ICD-10-CM | POA: Insufficient documentation

## 2020-07-21 DIAGNOSIS — I482 Chronic atrial fibrillation, unspecified: Secondary | ICD-10-CM | POA: Insufficient documentation

## 2020-07-21 DIAGNOSIS — Z7901 Long term (current) use of anticoagulants: Secondary | ICD-10-CM | POA: Diagnosis not present

## 2020-07-21 DIAGNOSIS — Z8249 Family history of ischemic heart disease and other diseases of the circulatory system: Secondary | ICD-10-CM | POA: Insufficient documentation

## 2020-07-21 DIAGNOSIS — I69320 Aphasia following cerebral infarction: Secondary | ICD-10-CM | POA: Insufficient documentation

## 2020-07-21 DIAGNOSIS — I69351 Hemiplegia and hemiparesis following cerebral infarction affecting right dominant side: Secondary | ICD-10-CM | POA: Diagnosis not present

## 2020-07-21 DIAGNOSIS — I5022 Chronic systolic (congestive) heart failure: Secondary | ICD-10-CM | POA: Diagnosis not present

## 2020-07-21 DIAGNOSIS — I2510 Atherosclerotic heart disease of native coronary artery without angina pectoris: Secondary | ICD-10-CM | POA: Diagnosis not present

## 2020-07-21 DIAGNOSIS — I428 Other cardiomyopathies: Secondary | ICD-10-CM | POA: Diagnosis present

## 2020-07-21 DIAGNOSIS — I4892 Unspecified atrial flutter: Secondary | ICD-10-CM | POA: Insufficient documentation

## 2020-07-21 DIAGNOSIS — Z87898 Personal history of other specified conditions: Secondary | ICD-10-CM

## 2020-07-21 LAB — BASIC METABOLIC PANEL
Anion gap: 7 (ref 5–15)
BUN: 29 mg/dL — ABNORMAL HIGH (ref 8–23)
CO2: 29 mmol/L (ref 22–32)
Calcium: 10 mg/dL (ref 8.9–10.3)
Chloride: 94 mmol/L — ABNORMAL LOW (ref 98–111)
Creatinine, Ser: 1.47 mg/dL — ABNORMAL HIGH (ref 0.61–1.24)
GFR, Estimated: 53 mL/min — ABNORMAL LOW (ref >60)
Glucose, Bld: 271 mg/dL — ABNORMAL HIGH (ref 70–99)
Potassium: 4.6 mmol/L (ref 3.5–5.1)
Sodium: 130 mmol/L — ABNORMAL LOW (ref 135–145)

## 2020-07-21 NOTE — Patient Instructions (Signed)
It was great to see you today! No medication changes are needed at this time.  Labs today We will only contact you if something comes back abnormal or we need to make some changes. Otherwise no news is good news!  Your physician recommends that you schedule a follow-up appointment in: 3 months with Dr McLean  Do the following things EVERYDAY: Weigh yourself in the morning before breakfast. Write it down and keep it in a log. Take your medicines as prescribed Eat low salt foods--Limit salt (sodium) to 2000 mg per day.  Stay as active as you can everyday Limit all fluids for the day to less than 2 liters  milAt the [facility], you and your health needs are our priority. As part of our continuing mission to provide you with exceptional heart care, we have created designated Provider Care Teams. These Care Teams include your primary Cardiologist (physician) and Advanced Practice Providers (APPs- Physician Assistants and Nurse Practitioners) who all work together to provide you with the care you need, when you need it.   You may see any of the following providers on your designated Care Team at your next follow up: Dr Daniel Bensimhon Dr Dalton McLean Dr Brandon Winfrey Amy Clegg, NP Brittainy Simmons, PA Jessica Milford,NP Lauren Kemp, PharmD   Please be sure to bring in all your medications bottles to every appointment.   If you have any questions or concerns before your next appointment please send us a message through mychart or call our office at [phone].    TO LEAVE A MESSAGE FOR THE NURSE SELECT OPTION 2, PLEASE LEAVE A MESSAGE INCLUDING: YOUR NAME DATE OF BIRTH CALL BACK NUMBER REASON FOR CALL**this is important as we prioritize the call backs  YOU WILL RECEIVE A CALL BACK THE SAME DAY AS LONG AS YOU CALL BEFORE 4:00 PM   

## 2020-10-05 ENCOUNTER — Other Ambulatory Visit: Payer: Self-pay

## 2020-10-05 ENCOUNTER — Telehealth (HOSPITAL_COMMUNITY): Payer: Self-pay | Admitting: *Deleted

## 2020-10-05 ENCOUNTER — Ambulatory Visit (HOSPITAL_COMMUNITY)
Admission: RE | Admit: 2020-10-05 | Discharge: 2020-10-05 | Disposition: A | Discharge status: Home / Self Care | Payer: Medicaid Other | Source: Ambulatory Visit | Attending: Internal Medicine | Admitting: Internal Medicine

## 2020-10-05 DIAGNOSIS — Z452 Encounter for adjustment and management of vascular access device: Secondary | ICD-10-CM | POA: Insufficient documentation

## 2020-10-05 NOTE — Telephone Encounter (Signed)
Pt's HHRN was in an accident and is not able to see pt today, [facility] company has no other available RN to provide picc line care and milrinone bag change. Discussed w/Pam Ave Filter, RN she will meet pt at our office this afternoon to provide care.

## 2020-10-06 ENCOUNTER — Ambulatory Visit: Payer: Medicaid Other | Admitting: Podiatry

## 2020-10-06 ENCOUNTER — Encounter: Payer: Self-pay | Admitting: Podiatry

## 2020-10-06 ENCOUNTER — Telehealth: Payer: Self-pay | Admitting: Cardiovascular Disease

## 2020-10-06 DIAGNOSIS — E1151 Type 2 diabetes mellitus with diabetic peripheral angiopathy without gangrene: Secondary | ICD-10-CM

## 2020-10-06 DIAGNOSIS — M79674 Pain in right toe(s): Secondary | ICD-10-CM | POA: Diagnosis not present

## 2020-10-06 DIAGNOSIS — B351 Tinea unguium: Secondary | ICD-10-CM | POA: Diagnosis not present

## 2020-10-06 DIAGNOSIS — M79675 Pain in left toe(s): Secondary | ICD-10-CM | POA: Diagnosis not present

## 2020-10-06 DIAGNOSIS — R0989 Other specified symptoms and signs involving the circulatory and respiratory systems: Secondary | ICD-10-CM

## 2020-10-06 NOTE — Progress Notes (Signed)
This patient returns to my office for at risk foot care.  This patient requires this care by a professional since this patient will be at risk due to having type 2 diabetes.  This patient is unable to cut nails himself since the patient cannot reach his nails.These nails are painful walking and wearing shoes. He presents with male caregiver. This patient presents for at risk foot care today.  General Appearance  Alert, conversant and in no acute stress.  Vascular  Dorsalis pedis and posterior tibial  pulses are  weakly palpable  right.  Absent dorsalis pedis and posterior tibial pulses  left.  Capillary return is within normal limits  bilaterally. Temperature is within normal limits  bilaterally.  Neurologic  Senn-Weinstein monofilament wire test within normal limits  bilaterally. Muscle power within normal limits bilaterally.  Nails Thick disfigured discolored nails with subungual debris  from hallux to fifth toes bilaterally. No evidence of bacterial infection or drainage bilaterally.  Orthopedic  No limitations of motion  feet .  No crepitus or effusions noted.  No bony pathology or digital deformities noted.  Skin  normotropic skin with no porokeratosis noted bilaterally.  No signs of infections or ulcers noted.     Onychomycosis  Pain in right toes  Pain in left toes  Consent was obtained for treatment procedures.   Mechanical debridement of nails 1-5  bilaterally performed with a nail nipper.  Filed with dremel without incident.    Return office visit    3 months                  Told patient to return for periodic foot care and evaluation due to potential at risk complications.   Keni Wafer DPM    

## 2020-10-06 NOTE — Telephone Encounter (Signed)
Called and left a message for Amy to call back.

## 2020-10-06 NOTE — Telephone Encounter (Signed)
Call to say that the patient needs to be referred to another home healthcare but they are understaffed. Please advise

## 2020-10-12 ENCOUNTER — Other Ambulatory Visit: Payer: Self-pay

## 2020-10-12 ENCOUNTER — Ambulatory Visit (HOSPITAL_COMMUNITY)
Admission: RE | Admit: 2020-10-12 | Discharge: 2020-10-12 | Disposition: A | Discharge status: Home / Self Care | Payer: Medicare Other | Source: Ambulatory Visit | Attending: Internal Medicine | Admitting: Internal Medicine

## 2020-10-12 DIAGNOSIS — I5022 Chronic systolic (congestive) heart failure: Secondary | ICD-10-CM

## 2020-10-12 DIAGNOSIS — Z452 Encounter for adjustment and management of vascular access device: Secondary | ICD-10-CM | POA: Insufficient documentation

## 2020-10-12 NOTE — Progress Notes (Signed)
Milrinone bag changed by Jeri Modena, RN with home infusions, PICC dsg change done by IV team.

## 2020-10-19 ENCOUNTER — Other Ambulatory Visit: Payer: Self-pay

## 2020-10-19 ENCOUNTER — Ambulatory Visit (HOSPITAL_COMMUNITY)
Admission: RE | Admit: 2020-10-19 | Discharge: 2020-10-19 | Disposition: A | Discharge status: Home / Self Care | Payer: Medicare Other | Source: Ambulatory Visit | Attending: Internal Medicine | Admitting: Internal Medicine

## 2020-10-19 DIAGNOSIS — Z452 Encounter for adjustment and management of vascular access device: Secondary | ICD-10-CM | POA: Insufficient documentation

## 2020-10-23 ENCOUNTER — Ambulatory Visit (HOSPITAL_COMMUNITY)
Admission: RE | Admit: 2020-10-23 | Discharge: 2020-10-23 | Disposition: A | Discharge status: Home / Self Care | Payer: Medicaid Other | Source: Ambulatory Visit | Attending: Cardiology | Admitting: Cardiology

## 2020-10-23 ENCOUNTER — Other Ambulatory Visit: Payer: Self-pay

## 2020-10-23 ENCOUNTER — Encounter (HOSPITAL_COMMUNITY): Payer: Self-pay | Admitting: Cardiology

## 2020-10-23 VITALS — BP 102/60 | HR 69 | Wt 136.6 lb

## 2020-10-23 DIAGNOSIS — Z7984 Long term (current) use of oral hypoglycemic drugs: Secondary | ICD-10-CM | POA: Diagnosis not present

## 2020-10-23 DIAGNOSIS — I69351 Hemiplegia and hemiparesis following cerebral infarction affecting right dominant side: Secondary | ICD-10-CM | POA: Insufficient documentation

## 2020-10-23 DIAGNOSIS — I5022 Chronic systolic (congestive) heart failure: Secondary | ICD-10-CM

## 2020-10-23 DIAGNOSIS — I482 Chronic atrial fibrillation, unspecified: Secondary | ICD-10-CM | POA: Diagnosis not present

## 2020-10-23 DIAGNOSIS — I4819 Other persistent atrial fibrillation: Secondary | ICD-10-CM

## 2020-10-23 DIAGNOSIS — E859 Amyloidosis, unspecified: Secondary | ICD-10-CM | POA: Diagnosis not present

## 2020-10-23 DIAGNOSIS — I5084 End stage heart failure: Secondary | ICD-10-CM | POA: Insufficient documentation

## 2020-10-23 DIAGNOSIS — Z8249 Family history of ischemic heart disease and other diseases of the circulatory system: Secondary | ICD-10-CM | POA: Diagnosis not present

## 2020-10-23 DIAGNOSIS — R188 Other ascites: Secondary | ICD-10-CM | POA: Insufficient documentation

## 2020-10-23 DIAGNOSIS — Z7901 Long term (current) use of anticoagulants: Secondary | ICD-10-CM | POA: Diagnosis not present

## 2020-10-23 DIAGNOSIS — I428 Other cardiomyopathies: Secondary | ICD-10-CM | POA: Diagnosis not present

## 2020-10-23 DIAGNOSIS — Z79899 Other long term (current) drug therapy: Secondary | ICD-10-CM | POA: Insufficient documentation

## 2020-10-23 DIAGNOSIS — I69320 Aphasia following cerebral infarction: Secondary | ICD-10-CM | POA: Insufficient documentation

## 2020-10-23 LAB — CBC
HCT: 40 % (ref 39.0–52.0)
Hemoglobin: 12.6 g/dL — ABNORMAL LOW (ref 13.0–17.0)
MCH: 28.3 pg (ref 26.0–34.0)
MCHC: 31.5 g/dL (ref 30.0–36.0)
MCV: 89.9 fL (ref 80.0–100.0)
Platelets: 181 10*3/uL (ref 150–400)
RBC: 4.45 MIL/uL (ref 4.22–5.81)
RDW: 14.3 % (ref 11.5–15.5)
WBC: 7 10*3/uL (ref 4.0–10.5)
nRBC: 0 % (ref 0.0–0.2)

## 2020-10-23 LAB — BASIC METABOLIC PANEL
Anion gap: 9 (ref 5–15)
BUN: 26 mg/dL — ABNORMAL HIGH (ref 8–23)
CO2: 25 mmol/L (ref 22–32)
Calcium: 9.9 mg/dL (ref 8.9–10.3)
Chloride: 100 mmol/L (ref 98–111)
Creatinine, Ser: 1.39 mg/dL — ABNORMAL HIGH (ref 0.61–1.24)
GFR, Estimated: 56 mL/min — ABNORMAL LOW (ref >60)
Glucose, Bld: 123 mg/dL — ABNORMAL HIGH (ref 70–99)
Potassium: 4.2 mmol/L (ref 3.5–5.1)
Sodium: 134 mmol/L — ABNORMAL LOW (ref 135–145)

## 2020-10-23 LAB — BRAIN NATRIURETIC PEPTIDE: B Natriuretic Peptide: 437.1 pg/mL — ABNORMAL HIGH (ref 0.0–100.0)

## 2020-10-23 NOTE — Progress Notes (Signed)
Advanced [facility] Failure Clinic Note   Referring Physician: PCP: Mirna Mires, MD PCP-Cardiologist: Verne Carrow, MD  HF Cardiology: Dr. Shirlee Latch   HPI:  65 y.o. with history of chronic atrial fibrillation and end stage nonischemic cardiomyopathy returns for followup of CHF.   Patient had CVA in 2010, since that time has had expressive aphasia and right-sided weakness.  He lives with his brother.  In 8/18, he was admited with new atrial flutter and EF was found to be 20%.  He had cath in 8/18 showing mild nonobstructive coronary disease.  In 10/18, he had DCCV to NSR.  In 11/18 while in NSR, echo showed EF up to 45-50%.  By 2019, he was back in atrial fibrillation.  He was not cardioverted again. Echo in 4/21 showed EF 20-25%.     Patient was admitted in 10/21 with h/o increased dyspnea with exertion and developed abdominal swelling.  Failed outpatient diuretic titration. Admitted for IV diuretics. Echo repeated and EF was 20% with mod-severe RV dilation and moderate RV dysfunction, D-shaped septum, dilated IVC, severe biatrial enlargement.  He was in atrial fibrillation in 50s-60s. CT of abdomen showed large volume ascites, had paracentesis with 4.8 L out.  Cardiac MRI showed LV EF 24%, RV mildly dilated with EF 42%, extensive LGE in a non-coronary distribution throughout much of the LV, also involving RV free wall and left and right atrial walls, ECV 42%. This was suggestive of infiltrative disease. 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  He was initially on milrinone for low output HF, this was weaned off for DCCV which he failed. RHC 11/11/19 off milrinone showed optimized filling pressures but low cardiac output, milrinone restarted at 0.125. Discharged w/ home milrinone + home health.  He remained in atrial fibrillation.   He was not thought to be a candidate for LVAD.   Echo in 6/22 showd EF 25-30%, mild LV dilation, mild LVH, moderately decreased RV systolic function, IVC dilated, PASP 49 mmHg.   He returns for followup of end-stage CHF.  Weight is stable.  History comes from family because of aphasia.  He remains on milrinone 0.125.  He seems to be doing well overall.  Mild dyspnea walking a long distance.  He took 1 break walking into the office today.  No dyspnea walking around the house.  He uses a cane.  No chest pain.  No lightheadedness. He does not have symptoms of peripheral neuropathy.    Labs (1/22): K 3.8, creatinine 1.2 Labs (4/22): K 4.2, creatinine 1.27 Labs (7/22): K 4.6, creatinine 1.47  Review of systems complete and found to be negative unless listed in HPI.    PMH: 1. 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  2. DM2 3. Atrial fibrillation: Chronic. Has failed DCCV and amiodarone.  4. Chronic systolic CHF: End-stage nonischemic cardiomyopathy.   - LHC (8/18): Mild nonobstructive CAD.  - RHC (11/21): mean RA 7, PA 29/9, mean PCWP 8, CI 1.6 (thermodilution)/2.5 (Fick) - Echo (11/21): EF 15-20%, D-shaped interventricular septum, severe dilated and severely dysfunctional RV, severe biatrial enlargement.  - Cardiac MRI (11/21): LV EF 24%, RV mildly dilated with EF 42%, extensive LGE in a non-coronary distribution throughout much of the LV, also involving RV free wall and left and right atrial walls, ECV 42%. This was suggestive of infiltrative disease. 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  - milrinone dependent - Echo (6/22): EF 25-30%, mild LV dilation, mild LVH, moderately decreased RV systolic function, IVC dilated, PASP 49 mmHg.   Current Outpatient Medications  Medication Sig Dispense Refill   Accu-Chek FastClix Lancets MISC 1 each by Other route as directed.     ACCU-CHEK GUIDE  test strip 1 each by Other route See admin instructions.     acetaminophen (TYLENOL) 500 MG tablet Take 1,000 mg by mouth every 6 (six) hours as needed for pain.     apixaban (ELIQUIS) 5 MG TABS tablet TAKE 1 TABLET(5 MG) BY MOUTH TWICE DAILY 180 tablet 3   milrinone (PRIMACOR) 20 MG/100 ML SOLN infusion Inject 0.0139 mg/min into the vein continuous. 100 mL 0   Multiple Vitamin (MULTIVITAMIN WITH MINERALS) TABS Take 1 tablet by mouth daily.     omega-3 acid ethyl esters (LOVAZA) 1 G capsule Take 1 g by mouth daily.     potassium chloride SA (KLOR-CON) 20 MEQ tablet Take 1 tablet (20 mEq total) by mouth daily. 90 tablet 3   simvastatin (ZOCOR) 40 MG tablet Take 40 mg by mouth daily.     spironolactone (ALDACTONE) 25 MG tablet TAKE 1 TABLET(25 MG) BY MOUTH DAILY 90 tablet 3   torsemide (DEMADEX) 20 MG tablet Take 2 tablets (40 mg total) by mouth in the morning AND 1 tablet (20 mg total) every evening. 270 tablet 3   TRADJENTA 5 MG TABS tablet Take 5 mg by mouth every morning.     No current facility-administered medications for this encounter.    No Known Allergies    Social History   Socioeconomic History   Marital status: Single    Spouse name: Not on file   Number of children: Not on file   Years of education: Not on file   Highest education level: Not on file  Occupational History   Not on file  Tobacco Use   Smoking status: Never   Smokeless tobacco: Never  Vaping Use   Vaping Use: Never used  Substance and Sexual Activity   Alcohol use: No   Drug use: No   Sexual activity: Not Currently  Other Topics Concern   Not on file  Social History Narrative   Not on file   Social Determinants of Health   Financial Resource Strain: Not on file  Food Insecurity: Not on file  Transportation Needs: Not on file  Physical Activity: Not on file  Stress: Not on file  Social Connections: Not on file  Intimate Partner Violence: Not on file      Family History  Problem Relation  Age of Onset   Hypertension Mother    Diabetes Mother    Heart disease Father     Vitals:   10/23/20 1030  BP: 102/60  Pulse: 69  SpO2: 99%  Weight: 62 kg (136 lb 9.6 oz)   PHYSICAL EXAM: General: NAD Neck: No JVD, no thyromegaly or thyroid nodule.  Lungs: Clear to auscultation bilaterally with normal respiratory effort. CV: Nondisplaced PMI.  Heart irregular S1/S2, no S3/S4, no murmur.  No peripheral edema.  No carotid bruit.  Normal pedal pulses.  Abdomen: Soft, nontender, no hepatosplenomegaly, no distention.  Skin: Intact without lesions or rashes.  Neurologic: Alert and oriented x 3.  Psych: Normal affect. Extremities: No clubbing or cyanosis.  HEENT: Normal.   ASSESSMENT & PLAN:  1. Chronic systolic CHF:  Nonischemic cardiomyopathy known since 2018.  Improved initially in 2018 with DCCV, but has been back in atrial fibrillation since 2019 it appears and EF has been low. Admission 10/21 for a/c HF w/ low output requiring milrinone. Echo showed EF 15-20% with mod-severe RV dilation and moderate RV dysfunction, D-shaped septum, dilated IVC, severe biatrial enlargement. Significant RV failure with ascites on CT abdomen, had paracentesis.  Cardiac MRI showed LV EF 24%, RV mildly dilated with EF 42%, extensive LGE in a non-coronary distribution throughout much of the LV, also involving RV free wall and left and right atrial walls, ECV 42%. This was suggestive of infiltrative disease. 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  He was initially on milrinone for low output HF, this was weaned off for DCCV which he failed. RHC 11/11/19 off milrinone showed optimized filling pressures but low cardiac output, milrinone restarted and has been continued since that time. He was sent  home in 11/21 on hospice but hospice has released him. Echo in 6/22 with EF 25-30%, moderately decreased RV function, dilated IVC.  He is not volume overloaded on exam today, NYHA class II-III.  - Continue home milrinone 0.125.    - Continue torsemide 40 qam/20 qpm.  BMET/BNP today.    - Continue Spiro 25 mg daily  - BP too low for ARB/ARNi - No digoxin with history of bradycardia.   - Narrow QRS, not CRT candidate.  - Not a transplant candidate and not a good candidate for LVAD. Patient assessed by our LVAD nurse, she had significant concerns about his right arm/hand weakness.  He would be unable to manage his LVAD equipment and would need consistent 24 hr caregiver (not available).   - As he has remained relatively stable on milrinone for about a year, I think that we can try him on tafamidis given hATTR cardiac amyloidosis. Will arrange for this.     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 Sidney Ace for genetic evaluation.   2. Atrial fibrillation: Now chronic.  Initially in 2018, EF improved after DCCV.  However, as above, he has been relatively well rate controlled and has not been in RVR, so do not think that cardiomyopathy is primarily due to AF.  He failed DCCV on amiodarone 10/21, amiodarone stopped due to bradycardia.  - Continue Eliquis. CBC today.   3. Ascites: Suspect due to RV failure. S/p Paracentesis on 10/31 with 4.8 L out. No evidence of recurrence on exam today  4. H/o CVA: In 2010, with chronic expressive aphasia and right-sided weakness.   Followup APP in 2 months.   Marca Ancona, MD 10/23/20

## 2020-10-23 NOTE — Patient Instructions (Addendum)
Labs done today. We will contact you only if your labs are abnormal.  Our Pharmacy Team will be in contact with you regarding starting the Tafamidis.   No other medication changes were made. Please continue all current medications as prescribed.  You have been referred to [facility]. They will contact you to schedule an appointment.   Your physician recommends that you schedule a follow-up appointment in: 2 months with our APP Clinic here in our office.   If you have any questions or concerns before your next appointment please send [name] a message through [place] or call our office at [phone].    TO LEAVE A MESSAGE FOR THE NURSE SELECT OPTION 2, PLEASE LEAVE A MESSAGE INCLUDING: YOUR NAME DATE OF BIRTH CALL BACK NUMBER REASON FOR CALL**this is important as we prioritize the call backs  YOU WILL RECEIVE A CALL BACK THE SAME DAY AS LONG AS YOU CALL BEFORE 4:00 PM   Do the following things EVERYDAY: Weigh yourself in the morning before breakfast. Write it down and keep it in a log. Take your medicines as prescribed Eat low salt foods--Limit salt (sodium) to 2000 mg per day.  Stay as active as you can everyday Limit all fluids for the day to less than 2 liters   At the [facility], you and your health needs are our priority. As part of our continuing mission to provide you with exceptional heart care, we have created designated Provider Care Teams. These Care Teams include your primary Cardiologist (physician) and Advanced Practice Providers (APPs- Physician Assistants and Nurse Practitioners) who all work together to provide you with the care you need, when you need it.   You may see any of the following providers on your designated Care Team at your next follow up: Dr Arvilla Meres Dr Carron Curie, NP Robbie Lis, [place] Karle Plumber, PharmD   Please be sure to bring in all your medications bottles to every appointment.

## 2020-10-24 ENCOUNTER — Telehealth (HOSPITAL_COMMUNITY): Payer: Self-pay | Admitting: Pharmacy Technician

## 2020-10-24 ENCOUNTER — Other Ambulatory Visit (HOSPITAL_COMMUNITY): Payer: Self-pay

## 2020-10-24 NOTE — Telephone Encounter (Signed)
Patient Advocate Encounter   Received notification from [facility] that prior authorization for Vyndamax is required.   PA submitted on CoverMyMeds Key [id number]  Status is pending   Will continue to follow.

## 2020-10-26 ENCOUNTER — Other Ambulatory Visit: Payer: Self-pay

## 2020-10-26 ENCOUNTER — Ambulatory Visit (HOSPITAL_COMMUNITY)
Admission: RE | Admit: 2020-10-26 | Discharge: 2020-10-26 | Disposition: A | Discharge status: Home / Self Care | Payer: Medicare Other | Source: Ambulatory Visit | Attending: Internal Medicine | Admitting: Internal Medicine

## 2020-10-26 ENCOUNTER — Other Ambulatory Visit (HOSPITAL_COMMUNITY): Payer: Self-pay

## 2020-10-26 DIAGNOSIS — Z7689 Persons encountering health services in other specified circumstances: Secondary | ICD-10-CM | POA: Diagnosis present

## 2020-10-26 NOTE — Progress Notes (Signed)
CSW consulted to meet with pt about obtaining medicare.  Pt is on chronic milrinone but Medicaid has stopped paying and the agency providing the medication is no longer willing to continue covering costs as it is very expensive.  Pt just turned 65 this month and pt niece reports that she went to [facility] and helped apply for retirement benefits and Medicare A and B.  Explained that he would also need a part D plan as we are needing coverage for a Medication.  Provided number to [facility] to speak with an Statistician to help find plan that will work for the patient.  They will follow up with [facility] today to get pt enrolled in appropriate plan.  They are unsure when coverage becomes active- explained it is usually the first of the following month.  Will continue to follow and assist as needed  Burna Sis, LCSW Clinical Social Worker [facility] Desk#: [phone] Cell#: [phone]

## 2020-10-27 ENCOUNTER — Other Ambulatory Visit (HOSPITAL_COMMUNITY): Payer: Self-pay

## 2020-10-27 NOTE — Telephone Encounter (Signed)
PA for Vyndamax was denied. Insurance wants patient to try and fail Vyndaqel before they are willing to pay for Vyndamax.  Will have to call insurance and get them to clear the cost exceeds max override then will send RX to [facility] outpatient pharmacy for the specialty pharmacy to fill.

## 2020-10-27 NOTE — Telephone Encounter (Signed)
Cost Exceeds max override has been approved. No PA for Vyndaqel needed at this time. Current 30 day co-pay, $4.  Will get RX switched and sent to [facility] outpatient for specialty pharmacy services.

## 2020-11-02 ENCOUNTER — Telehealth (HOSPITAL_COMMUNITY): Payer: Self-pay | Admitting: Pharmacist

## 2020-11-02 ENCOUNTER — Other Ambulatory Visit: Payer: Self-pay

## 2020-11-02 ENCOUNTER — Other Ambulatory Visit (HOSPITAL_COMMUNITY): Payer: Self-pay

## 2020-11-02 ENCOUNTER — Ambulatory Visit (HOSPITAL_COMMUNITY)
Admission: RE | Admit: 2020-11-02 | Discharge: 2020-11-02 | Disposition: A | Discharge status: Home / Self Care | Payer: Medicaid Other | Source: Ambulatory Visit | Attending: Cardiology | Admitting: Cardiology

## 2020-11-02 MED ORDER — ALTEPLASE 2 MG IJ SOLR
2.0000 mg | Freq: Once | INTRAMUSCULAR | Status: DC
  Filled 2020-11-02: qty 2

## 2020-11-02 MED ORDER — VYNDAQEL 20 MG PO CAPS: 80.0000 mg | ORAL_CAPSULE | Freq: Every day | ORAL | 11 refills | Status: DC

## 2020-11-02 MED ORDER — VYNDAQEL 20 MG PO CAPS
80.0000 mg | ORAL_CAPSULE | Freq: Every day | ORAL | 11 refills | Status: DC
  Filled 2020-11-02: qty 120, fill #0
  Filled 2020-11-03 – 2020-11-06 (×2): qty 120, 30d supply, fill #0
  Filled 2020-12-01: qty 120, 30d supply, fill #1
  Filled 2021-01-03: qty 120, 30d supply, fill #2
  Filled 2021-01-29 – 2021-01-31 (×2): qty 120, 30d supply, fill #3
  Filled 2021-02-26: qty 120, 30d supply, fill #4

## 2020-11-02 NOTE — Progress Notes (Signed)
Dressing change to outpatient DL PIC line. Milrinone infusing via CADD pump to purple lumen. Red port SL. Flushed red lumen with GBR. Purple port flushes easily with NBR. Dr. Jearld Pies ok'ed order for TPA via purple port. Pt to wait in OP clinic until TPA removed.

## 2020-11-02 NOTE — Telephone Encounter (Signed)
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 Spoke with patient and his brother in clinic today and counseled on medication. His brother and his niece Glendneil "[name]" help manage his medications (patient is a stroke victim and has difficulties managing himself). Patient is agreeable to receive Vyndaqel from [facility] and would like the medication shipped to him.   Karle Plumber, PharmD, BCPS, BCCP, CPP Heart Failure Clinic Pharmacist [phone]

## 2020-11-02 NOTE — Progress Notes (Signed)
Second IVT RN at bedside to check for blood return prior to administering TPA with success. Repositioning arm resulted in GBR. TPA and declotting order cancelled.

## 2020-11-03 ENCOUNTER — Other Ambulatory Visit (HOSPITAL_COMMUNITY): Payer: Self-pay

## 2020-11-06 ENCOUNTER — Other Ambulatory Visit (HOSPITAL_COMMUNITY): Payer: Self-pay

## 2020-11-07 ENCOUNTER — Other Ambulatory Visit (HOSPITAL_COMMUNITY): Payer: Self-pay

## 2020-11-09 ENCOUNTER — Other Ambulatory Visit (HOSPITAL_COMMUNITY): Payer: Self-pay | Admitting: Cardiology

## 2020-11-09 ENCOUNTER — Ambulatory Visit (HOSPITAL_COMMUNITY)
Admission: RE | Admit: 2020-11-09 | Discharge: 2020-11-09 | Disposition: A | Discharge status: Home / Self Care | Payer: Medicaid Other | Source: Ambulatory Visit | Attending: Cardiology | Admitting: Cardiology

## 2020-11-09 ENCOUNTER — Other Ambulatory Visit: Payer: Self-pay

## 2020-11-16 ENCOUNTER — Ambulatory Visit (HOSPITAL_COMMUNITY)
Admission: RE | Admit: 2020-11-16 | Discharge: 2020-11-16 | Disposition: A | Discharge status: Home / Self Care | Payer: Medicare Other | Source: Ambulatory Visit | Attending: Cardiology | Admitting: Cardiology

## 2020-11-16 DIAGNOSIS — Z4801 Encounter for change or removal of surgical wound dressing: Secondary | ICD-10-CM | POA: Insufficient documentation

## 2020-11-16 DIAGNOSIS — Z95811 Presence of heart assist device: Secondary | ICD-10-CM | POA: Diagnosis not present

## 2020-11-17 NOTE — Progress Notes (Signed)
Pt presents to HF clinic with brother, ambulatory, using cane for PICC line dressing change.   Dressing change to outpatient DL PIC line. Milrinone infusing via CADD pump without issues per patient.   Brother reports they will call if dressing change needed next week; they plan on checking with home health to see if [facility] nurse will be available to come to pt home for PICC line care.  Hessie Diener RN, VAD Coordinator [phone]

## 2020-11-20 ENCOUNTER — Other Ambulatory Visit (HOSPITAL_COMMUNITY): Payer: Self-pay

## 2020-11-23 ENCOUNTER — Ambulatory Visit (HOSPITAL_COMMUNITY)
Admission: RE | Admit: 2020-11-23 | Discharge: 2020-11-23 | Disposition: A | Discharge status: Home / Self Care | Payer: Medicaid Other | Source: Ambulatory Visit | Attending: Adult Health | Admitting: Adult Health

## 2020-11-23 DIAGNOSIS — I5022 Chronic systolic (congestive) heart failure: Secondary | ICD-10-CM

## 2020-11-28 ENCOUNTER — Other Ambulatory Visit (HOSPITAL_COMMUNITY): Payer: Self-pay

## 2020-11-29 ENCOUNTER — Ambulatory Visit (HOSPITAL_COMMUNITY)
Admission: RE | Admit: 2020-11-29 | Discharge: 2020-11-29 | Disposition: A | Discharge status: Home / Self Care | Payer: Medicare Other | Source: Ambulatory Visit | Attending: Cardiology | Admitting: Cardiology

## 2020-11-29 DIAGNOSIS — I5022 Chronic systolic (congestive) heart failure: Secondary | ICD-10-CM

## 2020-11-29 NOTE — Progress Notes (Signed)
PICC line dressing change:  Patient presents to clinic today for PICC line care. Existing PICC dressing removed and site care performed using sterile technique. Drive line exit site cleaned with Chlora prep applicator, allowed to dry, and Sorbaview dressing with CHG applied. Exit site healed and incorporated, the velour is fully implanted at exit site. No redness, tenderness, drainage, foul odor or rash noted. Pt denies fever or chills.    Return in 1 week for another dressing change per standard of care.    Carlton Adam RN, BSN VAD Coordinator 24/7 Pager [phone]

## 2020-12-01 ENCOUNTER — Other Ambulatory Visit (HOSPITAL_COMMUNITY): Payer: Self-pay

## 2020-12-07 ENCOUNTER — Other Ambulatory Visit (HOSPITAL_COMMUNITY): Payer: Self-pay

## 2020-12-07 ENCOUNTER — Ambulatory Visit (HOSPITAL_COMMUNITY)
Admission: RE | Admit: 2020-12-07 | Discharge: 2020-12-07 | Disposition: A | Discharge status: Home / Self Care | Payer: Medicare Other | Source: Ambulatory Visit | Attending: Family Medicine | Admitting: Family Medicine

## 2020-12-07 DIAGNOSIS — I5022 Chronic systolic (congestive) heart failure: Secondary | ICD-10-CM | POA: Diagnosis not present

## 2020-12-14 ENCOUNTER — Telehealth (HOSPITAL_COMMUNITY): Payer: Self-pay | Admitting: *Deleted

## 2020-12-14 ENCOUNTER — Ambulatory Visit (HOSPITAL_COMMUNITY)
Admission: RE | Admit: 2020-12-14 | Discharge: 2020-12-14 | Disposition: A | Discharge status: Home / Self Care | Payer: Medicare Other | Source: Ambulatory Visit | Attending: Cardiology | Admitting: Cardiology

## 2020-12-14 ENCOUNTER — Other Ambulatory Visit: Payer: Self-pay

## 2020-12-14 DIAGNOSIS — I5022 Chronic systolic (congestive) heart failure: Secondary | ICD-10-CM

## 2020-12-14 NOTE — Telephone Encounter (Signed)
Pt now has Medicare, referral placed to [facility] for skilled nursing, picc line, milrinone, and HF management

## 2020-12-18 ENCOUNTER — Telehealth (HOSPITAL_COMMUNITY): Payer: Self-pay | Admitting: *Deleted

## 2020-12-18 NOTE — Telephone Encounter (Signed)
Verbal order given for skilled nursing and picc line care. Detailed vm left for HHRN [id number]

## 2020-12-21 NOTE — Progress Notes (Signed)
[facility] Note   PCP: Iona Beard, MD PCP-Cardiologist: Lauree Chandler, MD  HF Cardiology: Dr. Aundra Dubin   HPI: [name] is a 65 y.o. with history of chronic atrial fibrillation and end stage nonischemic cardiomyopathy returns for followup of CHF.   Patient had CVA in 2010, since that time has had expressive aphasia and right-sided weakness.  He lives with his brother.  In 8/18, he was admited with new atrial flutter and EF was found to be 20%.  He had cath in 8/18 showing mild nonobstructive coronary disease.  In 10/18, he had DCCV to NSR.  In 11/18 while in NSR, echo showed EF up to 45-50%.  By 2019, he was back in atrial fibrillation.  He was not cardioverted again. Echo in 4/21 showed EF 20-25%.     Patient was admitted in 10/21 with h/o increased dyspnea with exertion and developed abdominal swelling.  Failed outpatient diuretic titration. Admitted for IV diuretics. Echo repeated and EF was 20% with mod-severe RV dilation and moderate RV dysfunction, D-shaped septum, dilated IVC, severe biatrial enlargement.  He was in atrial fibrillation in 50s-60s. CT of abdomen showed large volume ascites, had paracentesis with 4.8 L out.  Cardiac MRI showed LV EF 24%, RV mildly dilated with EF 42%, extensive LGE in a non-coronary distribution throughout much of the LV, also involving RV free wall and left and right atrial walls, ECV 42%. This was suggestive of infiltrative disease. 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  He was initially on milrinone for low output HF, this was weaned off for DCCV which he failed. [place] 11/11/19 off milrinone showed optimized filling pressures but low cardiac output, milrinone restarted at 0.125. Discharged w/ home milrinone + home health.  He remained in atrial fibrillation.  He  was not thought to be a candidate for LVAD.   Echo in 6/22 showd EF 25-30%, mild LV dilation, mild LVH, moderately decreased RV systolic function, IVC dilated, PASP 49 mmHg.   Today he returns for end-stage HF follow up with his family member. He is able to answer yes/no questions, but the rest of his history comes from family member. He remains mildly SOB walking longer distances. Continues to walk with a cane, no recent falls. Denies palpitations, abnormal bleeding, CP, dizziness, edema, or PND/Orthopnea. Appetite ok. No fever or chills. No issues with PICC or IV milrinone. Taking all medications. HH RN started coming out to the house last week. He does not have symptoms of peripheral neuropathy.    Labs (1/22): K 3.8, creatinine 1.2 Labs (4/22): K 4.2, creatinine 1.27 Labs (7/22): K 4.6, creatinine 1.47 Labs (10/22): K 4.2, creatinine 1.39  Review of systems complete and found to be negative unless listed in HPI.    PMH: 1. 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  2. DM2 3. Atrial fibrillation: Chronic. Has failed DCCV and amiodarone.  4. Chronic systolic CHF: End-stage nonischemic cardiomyopathy.   - LHC (8/18): Mild nonobstructive CAD.  - RHC (11/21): mean RA 7, PA 29/9, mean PCWP 8, CI 1.6 (thermodilution)/2.5 (Fick) - Echo (11/21): EF 15-20%, D-shaped interventricular septum, severe dilated and severely dysfunctional RV, severe biatrial enlargement.  - Cardiac MRI (11/21): LV EF 24%, RV mildly dilated with EF 42%, extensive LGE in a non-coronary distribution throughout much of the LV, also involving RV free wall  and left and right atrial walls, ECV 42%. This was suggestive of infiltrative disease. 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  - milrinone dependent - Echo (6/22): EF 25-30%, mild LV dilation, mild LVH, moderately decreased RV systolic function, IVC dilated, PASP 49  mmHg.   Current Outpatient Medications  Medication Sig Dispense Refill   Accu-Chek FastClix Lancets MISC 1 each by Other route as directed.     ACCU-CHEK GUIDE test strip 1 each by Other route See admin instructions.     acetaminophen (TYLENOL) 500 MG tablet Take 1,000 mg by mouth every 6 (six) hours as needed for pain.     apixaban (ELIQUIS) 5 MG TABS tablet TAKE 1 TABLET(5 MG) BY MOUTH TWICE DAILY 180 tablet 3   milrinone (PRIMACOR) 20 MG/100 ML SOLN infusion Inject 0.0139 mg/min into the vein continuous. 100 mL 0   Multiple Vitamin (MULTIVITAMIN WITH MINERALS) TABS Take 1 tablet by mouth daily.     omega-3 acid ethyl esters (LOVAZA) 1 G capsule Take 1 g by mouth daily.     potassium chloride SA (KLOR-CON) 20 MEQ tablet Take 1 tablet (20 mEq total) by mouth daily. 90 tablet 3   simvastatin (ZOCOR) 40 MG tablet Take 40 mg by mouth daily.     spironolactone (ALDACTONE) 25 MG tablet TAKE 1 TABLET(25 MG) BY MOUTH DAILY 90 tablet 3   Tafamidis Meglumine, Cardiac, (VYNDAQEL) 20 MG CAPS Take 4 capsules (80 mg) by mouth daily. 120 capsule 11   torsemide (DEMADEX) 20 MG tablet Take 2 tablets (40 mg total) by mouth in the morning AND 1 tablet (20 mg total) every evening. 270 tablet 3   TRADJENTA 5 MG TABS tablet Take 5 mg by mouth every morning.     No current facility-administered medications for this encounter.    No Known Allergies    Social History   Socioeconomic History   Marital status: Single    Spouse name: Not on file   Number of children: Not on file   Years of education: Not on file   Highest education level: Not on file  Occupational History   Not on file  Tobacco Use   Smoking status: Never   Smokeless tobacco: Never  Vaping Use   Vaping Use: Never used  Substance and Sexual Activity   Alcohol use: No   Drug use: No   Sexual activity: Not Currently  Other Topics Concern   Not on file  Social History Narrative   Not on file   Social Determinants of Health    Financial Resource Strain: Not on file  Food Insecurity: Not on file  Transportation Needs: Not on file  Physical Activity: Not on file  Stress: Not on file  Social Connections: Not on file  Intimate Partner Violence: Not on file   Family History  Problem Relation Age of Onset   Hypertension Mother    Diabetes Mother    Heart disease Father    BP 116/74    Pulse 92    SpO2 95%   Wt Readings from Last 3 Encounters:  10/23/20 62 kg (136 lb 9.6 oz)  07/21/20 61.8 kg (136 lb 3.2 oz)  06/09/20 61 kg (134 lb 6.4 oz)   PHYSICAL EXAM: General:  NAD. No resp difficulty, walked into clinic with cane, thin HEENT: Normal Neck: Supple. No JVD. Carotids 2+ bilat; no bruits. No lymphadenopathy or thryomegaly appreciated. Cor: PMI nondisplaced.  Irregular rate & rhythm. No rubs, gallops or murmurs. Lungs: Clear Abdomen: Soft, nontender, nondistended. No hepatosplenomegaly. No bruits or masses. Good bowel sounds. Extremities: No cyanosis, clubbing, rash, edema; LUE PICC looks OK Neuro: Alert & oriented x 3, cranial nerves grossly intact. RUE contracted. Affect pleasant. +aphasic  ASSESSMENT & PLAN: 1. Chronic systolic CHF: Nonischemic cardiomyopathy known since 2018.  Improved initially in 2018 with DCCV, but has been back in atrial fibrillation since 2019 it appears and EF has been low. Admission 10/21 for a/c HF w/ low output requiring milrinone. Echo showed EF 15-20% with mod-severe RV dilation and moderate RV dysfunction, D-shaped septum, dilated IVC, severe biatrial enlargement. Significant RV failure with ascites on CT abdomen, had paracentesis.  Cardiac MRI showed LV EF 24%, RV mildly dilated with EF 42%, extensive LGE in a non-coronary distribution throughout much of the LV, also involving RV free wall and left and right atrial walls, ECV 42%. This was suggestive of infiltrative disease. The walls are not thick, which seems to make amyloidosis or Fabry's less likely, but the PYP scan was  suggestive of TTR cardiac amyloidosis (myeloma panel and urine immunofixation without monoclonal protein).  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  He was initially on milrinone for low output HF, this was weaned off for DCCV which he failed. [place] 11/11/19 off milrinone showed optimized filling pressures but low cardiac output, milrinone restarted and has been continued since that time. He was sent home in 11/21 on hospice but hospice has released him. Echo in 6/22 with EF 25-30%, moderately decreased RV function, dilated IVC.  He is not volume overloaded on exam today, NYHA class II-III.  - Continue home milrinone 0.125.    - Continue torsemide 40 qam/20 qpm.  BMET/Mag today.    - Continue spiro 25 mg daily.  - BP borderline for ARB/ARNi. - No digoxin with history of bradycardia.   - Narrow QRS, not CRT candidate.  - Not a transplant candidate and not a good candidate for LVAD. Patient assessed by our LVAD nurse, she had significant concerns about his right arm/hand weakness.  He would be unable to manage his LVAD equipment and would need consistent 24 hr caregiver (not available).   - Continue tafamidis 80 mg daily, given hATTR cardiac amyloidosis.   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 Lattie Corns for genetic evaluation.   2. Atrial fibrillation: Now chronic.  Initially in 2018, EF improved after DCCV.  However, as above, he has been relatively well rate controlled and has not been in RVR, so do not think that cardiomyopathy is primarily due to AF.  He failed DCCV on amiodarone 10/21, amiodarone stopped due to bradycardia.  - Continue Eliquis. CBC today.   3. Ascites: Suspect due to RV failure. S/p Paracentesis on 10/31 with 4.8 L out. No evidence of recurrence on exam today  4. H/o CVA: In 2010, with chronic expressive aphasia and right-sided weakness.   Followup in 3 months with Dr. Loistine Simas, FNP 12/25/20

## 2020-12-25 ENCOUNTER — Other Ambulatory Visit: Payer: Self-pay

## 2020-12-25 ENCOUNTER — Ambulatory Visit (HOSPITAL_COMMUNITY)
Admission: RE | Admit: 2020-12-25 | Discharge: 2020-12-25 | Disposition: A | Discharge status: Home / Self Care | Payer: Medicare Other | Source: Ambulatory Visit | Attending: Family Medicine | Admitting: Family Medicine

## 2020-12-25 ENCOUNTER — Encounter (HOSPITAL_COMMUNITY): Payer: Self-pay

## 2020-12-25 VITALS — BP 116/74 | HR 92

## 2020-12-25 DIAGNOSIS — E859 Amyloidosis, unspecified: Secondary | ICD-10-CM | POA: Diagnosis not present

## 2020-12-25 DIAGNOSIS — I428 Other cardiomyopathies: Secondary | ICD-10-CM | POA: Diagnosis not present

## 2020-12-25 DIAGNOSIS — Z87898 Personal history of other specified conditions: Secondary | ICD-10-CM

## 2020-12-25 DIAGNOSIS — I5022 Chronic systolic (congestive) heart failure: Secondary | ICD-10-CM | POA: Insufficient documentation

## 2020-12-25 DIAGNOSIS — I43 Cardiomyopathy in diseases classified elsewhere: Secondary | ICD-10-CM | POA: Diagnosis not present

## 2020-12-25 DIAGNOSIS — I482 Chronic atrial fibrillation, unspecified: Secondary | ICD-10-CM | POA: Diagnosis not present

## 2020-12-25 DIAGNOSIS — Z79899 Other long term (current) drug therapy: Secondary | ICD-10-CM | POA: Diagnosis not present

## 2020-12-25 DIAGNOSIS — I5084 End stage heart failure: Secondary | ICD-10-CM | POA: Diagnosis not present

## 2020-12-25 DIAGNOSIS — I2510 Atherosclerotic heart disease of native coronary artery without angina pectoris: Secondary | ICD-10-CM | POA: Insufficient documentation

## 2020-12-25 DIAGNOSIS — I4819 Other persistent atrial fibrillation: Secondary | ICD-10-CM

## 2020-12-25 DIAGNOSIS — I69320 Aphasia following cerebral infarction: Secondary | ICD-10-CM | POA: Diagnosis not present

## 2020-12-25 DIAGNOSIS — R531 Weakness: Secondary | ICD-10-CM | POA: Insufficient documentation

## 2020-12-25 DIAGNOSIS — I4892 Unspecified atrial flutter: Secondary | ICD-10-CM | POA: Diagnosis not present

## 2020-12-25 DIAGNOSIS — R188 Other ascites: Secondary | ICD-10-CM | POA: Diagnosis not present

## 2020-12-25 DIAGNOSIS — Z7901 Long term (current) use of anticoagulants: Secondary | ICD-10-CM | POA: Diagnosis not present

## 2020-12-25 LAB — CBC
HCT: 39.3 % (ref 39.0–52.0)
Hemoglobin: 12.7 g/dL — ABNORMAL LOW (ref 13.0–17.0)
MCH: 28.7 pg (ref 26.0–34.0)
MCHC: 32.3 g/dL (ref 30.0–36.0)
MCV: 88.9 fL (ref 80.0–100.0)
Platelets: 124 10*3/uL — ABNORMAL LOW (ref 150–400)
RBC: 4.42 MIL/uL (ref 4.22–5.81)
RDW: 15 % (ref 11.5–15.5)
WBC: 5.3 10*3/uL (ref 4.0–10.5)
nRBC: 0 % (ref 0.0–0.2)

## 2020-12-25 LAB — BASIC METABOLIC PANEL
Anion gap: 11 (ref 5–15)
BUN: 20 mg/dL (ref 8–23)
CO2: 25 mmol/L (ref 22–32)
Calcium: 9.7 mg/dL (ref 8.9–10.3)
Chloride: 95 mmol/L — ABNORMAL LOW (ref 98–111)
Creatinine, Ser: 1.49 mg/dL — ABNORMAL HIGH (ref 0.61–1.24)
GFR, Estimated: 52 mL/min — ABNORMAL LOW (ref >60)
Glucose, Bld: 117 mg/dL — ABNORMAL HIGH (ref 70–99)
Potassium: 4.1 mmol/L (ref 3.5–5.1)
Sodium: 131 mmol/L — ABNORMAL LOW (ref 135–145)

## 2020-12-25 LAB — MAGNESIUM: Magnesium: 2.3 mg/dL (ref 1.7–2.4)

## 2020-12-25 NOTE — Patient Instructions (Signed)
Medication Changes:  None  Lab Work:  Labs today We will only contact you if something comes back abnormal or we need to make some changes. Otherwise no news is good news!  Follow-Up in: 3 months with Dr. Shirlee Latch.  At the [facility], you and your health needs are our priority. We have a designated team specialized in the treatment of Heart Failure. This Care Team includes your primary Heart Failure Specialized Cardiologist (physician), Advanced Practice Providers (APPs- Physician Assistants and Nurse Practitioners), and Pharmacist who all work together to provide you with the care you need, when you need it.   You may see any of the following providers on your designated Care Team at your next follow up:  Dr Arvilla Meres Dr Carron Curie, NP Robbie Lis, [place] [facility] [place], [place] Karle Plumber, PharmD   Please be sure to bring in all your medications bottles to every appointment.   Need to Contact us:  If you have any questions or concerns before your next appointment please send [name] a message through [place] or call our office at [phone].    TO LEAVE A MESSAGE FOR THE NURSE SELECT OPTION 2, PLEASE LEAVE A MESSAGE INCLUDING: YOUR NAME DATE OF BIRTH CALL BACK NUMBER REASON FOR CALL**this is important as we prioritize the call backs  YOU WILL RECEIVE A CALL BACK THE SAME DAY AS LONG AS YOU CALL BEFORE 4:00 PM

## 2020-12-28 ENCOUNTER — Other Ambulatory Visit (HOSPITAL_COMMUNITY): Payer: Self-pay

## 2020-12-29 ENCOUNTER — Other Ambulatory Visit (HOSPITAL_COMMUNITY): Payer: Self-pay

## 2021-01-03 ENCOUNTER — Other Ambulatory Visit (HOSPITAL_COMMUNITY): Payer: Self-pay

## 2021-01-04 ENCOUNTER — Other Ambulatory Visit (HOSPITAL_COMMUNITY): Payer: Self-pay

## 2021-01-05 ENCOUNTER — Other Ambulatory Visit: Payer: Self-pay

## 2021-01-05 ENCOUNTER — Ambulatory Visit (INDEPENDENT_AMBULATORY_CARE_PROVIDER_SITE_OTHER): Payer: Medicare Other | Admitting: Podiatry

## 2021-01-05 DIAGNOSIS — Z8673 Personal history of transient ischemic attack (TIA), and cerebral infarction without residual deficits: Secondary | ICD-10-CM

## 2021-01-05 DIAGNOSIS — M79674 Pain in right toe(s): Secondary | ICD-10-CM

## 2021-01-05 DIAGNOSIS — M79675 Pain in left toe(s): Secondary | ICD-10-CM | POA: Diagnosis not present

## 2021-01-05 DIAGNOSIS — B351 Tinea unguium: Secondary | ICD-10-CM

## 2021-01-05 DIAGNOSIS — E1151 Type 2 diabetes mellitus with diabetic peripheral angiopathy without gangrene: Secondary | ICD-10-CM

## 2021-01-05 NOTE — Progress Notes (Signed)
This patient returns to my office for at risk foot care.  This patient requires this care by a professional since this patient will be at risk due to having type 2 diabetes.  This patient is unable to cut nails himself since the patient cannot reach his nails.These nails are painful walking and wearing shoes. He presents with male caregiver. This patient presents for at risk foot care today.  General Appearance  Alert, conversant and in no acute stress.  Vascular  Dorsalis pedis and posterior tibial  pulses are  weakly palpable  right.  Absent dorsalis pedis and posterior tibial pulses  left.  Capillary return is within normal limits  bilaterally. Temperature is within normal limits  bilaterally.  Neurologic  Senn-Weinstein monofilament wire test within normal limits  bilaterally. Muscle power within normal limits bilaterally.  Nails Thick disfigured discolored nails with subungual debris  from hallux to fifth toes bilaterally. No evidence of bacterial infection or drainage bilaterally.  Orthopedic  No limitations of motion  feet .  No crepitus or effusions noted.  No bony pathology or digital deformities noted.  Skin  normotropic skin with no porokeratosis noted bilaterally.  No signs of infections or ulcers noted.     Onychomycosis  Pain in right toes  Pain in left toes  Consent was obtained for treatment procedures.   Mechanical debridement of nails 1-5  bilaterally performed with a nail nipper.  Filed with dremel without incident.    Return office visit    3 months                  Told patient to return for periodic foot care and evaluation due to potential at risk complications.   Inna Tisdell DPM    

## 2021-01-15 ENCOUNTER — Other Ambulatory Visit: Payer: Self-pay

## 2021-01-15 MED ORDER — SPIRONOLACTONE 25 MG PO TABS: ORAL_TABLET | ORAL | 0 refills | Status: DC

## 2021-01-29 ENCOUNTER — Other Ambulatory Visit (HOSPITAL_COMMUNITY): Payer: Self-pay

## 2021-01-30 ENCOUNTER — Telehealth (HOSPITAL_COMMUNITY): Payer: Self-pay | Admitting: Cardiology

## 2021-01-30 MED ORDER — POTASSIUM CHLORIDE CRYS ER 20 MEQ PO TBCR: 10.0000 meq | EXTENDED_RELEASE_TABLET | Freq: Every day | ORAL | 3 refills | Status: DC

## 2021-01-30 NOTE — Telephone Encounter (Signed)
Abnormal labs received via HH K 5.0 Cr 1.45   Per Dorena Cookey, NP  Decrease K to 10 meq daily repeat labs in 7-10 days  Pt aware via niece Will have labs repeated with [facility]

## 2021-02-01 ENCOUNTER — Other Ambulatory Visit (HOSPITAL_COMMUNITY): Payer: Self-pay

## 2021-02-19 ENCOUNTER — Other Ambulatory Visit (HOSPITAL_COMMUNITY): Payer: Self-pay | Admitting: Cardiology

## 2021-02-20 ENCOUNTER — Other Ambulatory Visit (HOSPITAL_COMMUNITY): Payer: Self-pay

## 2021-02-20 ENCOUNTER — Other Ambulatory Visit: Payer: Self-pay

## 2021-02-20 ENCOUNTER — Telehealth: Payer: Self-pay | Admitting: Cardiovascular Disease

## 2021-02-20 ENCOUNTER — Ambulatory Visit: Payer: Medicare Other | Admitting: Genetic Counselor

## 2021-02-20 NOTE — Telephone Encounter (Signed)
Brother says he was returning call. Please advise

## 2021-02-23 NOTE — Progress Notes (Signed)
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 He is here today with his older brother, [name] and niece, [name]. 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 This variant has been reported in several patients with cardiac amyloidosis and is more prevalent amongst African Americans. Patients harboring the TTR V142I variant typically develop cardiac symptoms after the age of 45. About 3% to 3.9% of African Americans are heterozygous for V142I pathogenic variant. Individuals with this pathogenic variant are known to have heart failure in their 69s.   I explained that a mutation in TTR gene destabilizes the protein causing it to misfold resulting in amyloid deposits in different tissues, such as the peripheral nervous system, brain and heart. Amyloid deposits in these tissues can lead to numbness in the lower limbs, feet and hands, stroke or left ventricular hypertrophy. They verbalized understanding of this and tell me that he does not have neuropathy or carpal tunnel syndrome but did have a stroke in 2009. Treatment options are available to stabilize the TTR tetramer and were also briefly reviewed. They report that he is on Vyndamax (tafamidis), a drug that stabilizes the TTR tetramer thus preventing it from forming fibrils that are stored in cells.  We also discussed the genetics of hereditary transthyretin amyloidosis (HTA), namely  inheritance, incomplete penetrance and variable expression that is seen in patients with cardiac amyloidosis due to mutations in TTR gene. I explained to them that as this is an autosomal dominant condition, and if he inherited it from one of his parents, then his siblings are at a 50% risk of harboring the TTR V142I pathogenic variant. His 4-generation family history was obtained.  Family History [name] (III.3) does not have children. He has one living sibling, [name] (III.10 who is with him today, [name] reports having no health issues and his children , ages 22 an 60 (IV.1, IV.2) are in good health. [name]'s elder sister (III.2) died of a brain hemorrhage at age 26. She has two daughters, ages 49 and 45 and a son age 56 (III.3-III.5). A younger sister (III.4) was a diabetic double amputee. She was on dialysis from end stage renal disease and died from multi-organ failure at age 4. She did not have children.  They do not know much about [name]'s father (II.4) or the cause of his death at 71. Paternal aunt (II.3) and uncles (II.1-II.2) are living and in their 19s and 90s in good health. Both paternal grandparents (I.1, I.2) died in their 59s from old age.  [name]'s mother (II.5) died at 100. A maternal aunt (II.6) died at 4 from Alzheimer's disease and another aunt (II.7) died in her 36s from a motor vehicle accident. 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 He does not have children but has one living sibling who is currently asymptomatic. As hereditary transthyretin-mediated amyloidosis-hATTR is a condition associated with late age of onset that is progressively debilitating, it is recommended that his brother and children of his deceased sister undergo genetic testing for the familial  pathogenic variant to determine their risk for cardiac amyloidosis so as to reduce morbidity and mortality by early diagnosis and treatment. This will help direct appropriate surveillance, treatment and subsequent management of cardiac amyloidosis.   In addition, we discussed the protections afforded by the Genetic Information Non-Discrimination Act (GINA). I explained to them that GINA protects him from losing his employment and health insurance based on his genotype. However, these protections do not cover life insurance and disability. They verbalized understanding of this and have life insurance in place.   Please note that the patient has not been counseled in this visit on personal, cultural or ethical issues that he may face due to his heart condition.   His brother and niece are interested in pursuing genetic testing. They do not have a cardiologist and will work with their PCP to order the genetic test.   [name], Ph.D, [facility] Clinical Molecular Geneticist

## 2021-02-26 ENCOUNTER — Other Ambulatory Visit (HOSPITAL_COMMUNITY): Payer: Self-pay

## 2021-02-27 ENCOUNTER — Telehealth (HOSPITAL_COMMUNITY): Payer: Self-pay | Admitting: Pharmacy Technician

## 2021-02-27 ENCOUNTER — Telehealth (HOSPITAL_COMMUNITY): Payer: Self-pay | Admitting: Pharmacist

## 2021-02-27 ENCOUNTER — Other Ambulatory Visit (HOSPITAL_COMMUNITY): Payer: Self-pay

## 2021-02-27 MED ORDER — VYNDAMAX 61 MG PO CAPS
1.0000 | ORAL_CAPSULE | Freq: Every day | ORAL | 11 refills | Status: DC
  Filled 2021-02-27: qty 30, fill #0

## 2021-02-27 MED ORDER — VYNDAMAX 61 MG PO CAPS
1.0000 | ORAL_CAPSULE | Freq: Every day | ORAL | 3 refills | Status: DC
  Filled 2021-02-27 – 2021-03-08 (×2): qty 90, 90d supply, fill #0
  Filled 2021-04-05 – 2021-05-24 (×2): qty 90, 90d supply, fill #1
  Filled 2021-08-16: qty 90, 90d supply, fill #2
  Filled 2021-11-08: qty 90, 90d supply, fill #3

## 2021-02-27 NOTE — Telephone Encounter (Signed)
Advanced Heart Failure Patient Advocate Encounter  Prior Authorization for [name] has been approved.    Effective dates: 01/07/21 through 02/27/22  Patients co-pay is $0 90 days  Called and spoke with patient's niece [name]. They are aware to finish the Vyndaqel that he has on hand and will start Vyndamax once that bottle is finished. [facility]) sent updated RX to [facility] for the specialty pharmacy to process.  Archer Asa, CPhT

## 2021-02-27 NOTE — Telephone Encounter (Signed)
Patient Advocate Encounter   Received notification from [place] that prior authorization for Vyndamax is required. Will try to get patient changed from [facility] to [facility] now that he has Medicare.    PA submitted on CoverMyMeds Key [id number] Status is pending   Will continue to follow.

## 2021-02-27 NOTE — Telephone Encounter (Signed)
Vyndamax prescription sent to [facility]. Patient's insurance changed and Vyndamax is now preferred over Vyndaqel.   Karle Plumber, PharmD, BCPS, BCCP, CPP Heart Failure Clinic Pharmacist [phone]

## 2021-02-27 NOTE — Addendum Note (Signed)
Addended by: Evon Slack on: 02/27/2021 03:49 PM   Modules accepted: Orders

## 2021-03-08 ENCOUNTER — Other Ambulatory Visit (HOSPITAL_COMMUNITY): Payer: Self-pay

## 2021-03-21 ENCOUNTER — Other Ambulatory Visit (HOSPITAL_COMMUNITY): Payer: Self-pay | Admitting: Cardiology

## 2021-03-26 NOTE — Progress Notes (Signed)
?Advanced Heart Failure Clinic Note  ? ?PCP: Mirna Mires, MD ?PCP-Cardiologist: Verne Carrow, MD  ?HF Cardiology: Dr. Shirlee Latch  ? ?HPI: ?[name] is a 66 y.o. with history of chronic atrial fibrillation and end stage nonischemic cardiomyopathy returns for followup of CHF.  ? ?Patient had CVA in 2010, since that time has had expressive aphasia and right-sided weakness.  He lives with his brother.  In 8/18, he was admited with new atrial flutter and EF was found to be 20%.  He had cath in 8/18 showing mild nonobstructive coronary disease.  In 10/18, he had DCCV to NSR.  In 11/18 while in NSR, echo showed EF up to 45-50%.  By 2019, he was back in atrial fibrillation.  He was not cardioverted again. Echo in 4/21 showed EF 20-25%.   ?  ?Patient was admitted in 10/21 with h/o increased dyspnea with exertion and developed abdominal swelling.  Failed outpatient diuretic titration. Admitted for IV diuretics. Echo repeated and EF was 20% with mod-severe RV dilation and moderate RV dysfunction, D-shaped septum, dilated IVC, severe biatrial enlargement.  He was in atrial fibrillation in 50s-60s. CT of abdomen showed large volume ascites, had paracentesis with 4.8 L out.  Cardiac MRI showed LV EF 24%, RV mildly dilated with EF 42%, extensive LGE in a non-coronary distribution throughout much of the LV, also involving RV free wall and left and right atrial walls, ECV 42%. This was suggestive of infiltrative disease. 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  He was initially on milrinone for low output HF, this was weaned off for DCCV which he failed. RHC 11/11/19 off milrinone showed optimized filling pressures but low cardiac output, milrinone restarted at 0.125. Discharged w/ home milrinone + home health.  He remained in atrial fibrillation.  He  was not thought to be a candidate for LVAD.  ? ?Echo in 6/22 showd EF 25-30%, mild LV dilation, mild LVH, moderately decreased RV systolic function, IVC dilated, PASP 49 mmHg.  ? ?Today he returns for end-stage HF follow up with his family member. He is able to answer yes/no questions, but the rest of his history comes from family member. He remains mildly SOB walking longer distances. He does OK with stairs if he takes his time. Continues to walk with a cane, no recent falls. Denies palpitations, abnormal bleeding, CP, dizziness, edema, or PND/Orthopnea. Appetite ok. No fever or chills. No issues with PICC or IV milrinone. Weight at home 130 lbs. Taking all medications. Labs drawn by [facility] RN weekly. He does not have symptoms of peripheral neuropathy.   ? ?ECG (personally reviewed): atrial flutter 57 bpm ? ?Labs (1/22): K 3.8, creatinine 1.2 ?Labs (4/22): K 4.2, creatinine 1.27 ?Labs (7/22): K 4.6, creatinine 1.47 ?Labs (10/22): K 4.2, creatinine 1.39 ?Labs (3/23): K 4.2, creatinine 1.35, hgb 10.9 ? ?Review of systems complete and found to be negative unless listed in HPI.   ? ?PMH: ?1. 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  ?2. DM2 ?3. Atrial fibrillation: Chronic. Has failed DCCV and amiodarone.  ?4. Chronic systolic CHF: End-stage nonischemic cardiomyopathy.   ?- LHC (8/18): Mild nonobstructive CAD.  ?- RHC (11/21): mean RA 7, PA 29/9, mean PCWP 8, CI 1.6 (thermodilution)/2.5 (Fick) ?- Echo (11/21): EF 15-20%, D-shaped interventricular septum, severe dilated and severely dysfunctional RV, severe biatrial enlargement.  ?- Cardiac  MRI (11/21): LV EF 24%, RV mildly dilated with EF 42%, extensive LGE in a non-coronary distribution throughout much of the LV, also involving RV free wall and left and right atrial walls, ECV 42%. This was suggestive of infiltrative disease. ?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   ?- milrinone dependent ?- Echo (6/22): EF 25-30%, mild LV dilation, mild LVH, moderately decreased RV systolic function, IVC dilated, PASP 49 mmHg. ? ? ?Current Outpatient Medications  ?Medication Sig Dispense Refill  ? Accu-Chek FastClix Lancets MISC 1 each by Other route as directed.    ? ACCU-CHEK GUIDE test strip 1 each by Other route See admin instructions.    ? acetaminophen (TYLENOL) 500 MG tablet Take 1,000 mg by mouth every 6 (six) hours as needed for pain.    ? apixaban (ELIQUIS) 5 MG TABS tablet TAKE 1 TABLET(5 MG) BY MOUTH TWICE DAILY 180 tablet 3  ? milrinone (PRIMACOR) 20 MG/100 ML SOLN infusion Inject 0.0139 mg/min into the vein continuous. 100 mL 0  ? Multiple Vitamin (MULTIVITAMIN WITH MINERALS) TABS Take 1 tablet by mouth daily.    ? omega-3 acid ethyl esters (LOVAZA) 1 G capsule Take 1 g by mouth daily.    ? potassium chloride SA (KLOR-CON M) 20 MEQ tablet Take 0.5 tablets (10 mEq total) by mouth daily. 90 tablet 3  ? simvastatin (ZOCOR) 40 MG tablet Take 40 mg by mouth daily.    ? spironolactone (ALDACTONE) 25 MG tablet TAKE 1 TABLET(25 MG) BY MOUTH DAILY 90 tablet 0  ? Tafamidis (VYNDAMAX) 61 MG CAPS Take 1 capsule by mouth daily. 90 capsule 3  ? torsemide (DEMADEX) 20 MG tablet Take 2 tablets (40 mg total) by mouth in the morning AND 1 tablet (20 mg total) every evening. 270 tablet 3  ? TRADJENTA 5 MG TABS tablet Take 5 mg by mouth every morning.    ? ?No current facility-administered medications for this encounter.  ? ?No Known Allergies ? ?Social History  ? ?Socioeconomic History  ? Marital status: Single  ?  Spouse name: Not on file  ? Number of children: Not on file  ? Years of education: Not on file  ? Highest education level: Not on file  ?Occupational History  ? Not on file  ?Tobacco Use  ? Smoking status: Never  ? Smokeless tobacco: Never  ?Vaping Use  ? Vaping Use: Never used  ?Substance and Sexual Activity  ? Alcohol use: No  ? Drug use: No  ? Sexual activity: Not Currently  ?Other  Topics Concern  ? Not on file  ?Social History Narrative  ? Not on file  ? ?Social Determinants of Health  ? ?Financial Resource Strain: Not on file  ?Food Insecurity: Not on file  ?Transportation Needs: Not on file  ?Physical Activity: Not on file  ?Stress: Not on file  ?Social Connections: Not on file  ?Intimate Partner Violence: Not on file  ? ?Family History  ?Problem Relation Age of Onset  ? Hypertension Mother   ? Diabetes Mother   ? Heart disease Father   ? ?BP 110/78   Pulse 84   Wt 60.3 kg (133 lb)   SpO2 100%   BMI 20.83 kg/m?  ? ?Wt Readings from Last 3 Encounters:  ?03/27/21 60.3 kg (133 lb)  ?10/23/20 62 kg (136 lb 9.6 oz)  ?07/21/20 61.8 kg (136 lb 3.2 oz)  ? ?PHYSICAL EXAM: ?General:  NAD. No resp difficulty, walked into clinic w/ cane, thin ?HEENT: Normal ?Neck: Supple. No JVD. Carotids 2+ bilat; no bruits. No lymphadenopathy or thryomegaly appreciated. ?Cor: PMI nondisplaced. Irregular rate & rhythm. No rubs, gallops or murmurs. ?Lungs: Clear ?Abdomen: Soft, nontender, nondistended. No hepatosplenomegaly. No bruits or masses. Good bowel sounds. ?Extremities: No cyanosis, clubbing, rash, edema; PICC to LUE ?Neuro: + aphasia, answers yes/no questions appropriately, cranial nerves grossly intact. Moves all 4 extremities, RUE contracted. Affect pleasant. ? ?ASSESSMENT & PLAN: ?1. Chronic systolic CHF: Nonischemic cardiomyopathy known since 2018.  Improved initially in 2018 with DCCV, but has been back in atrial fibrillation since 2019 it appears and EF has been low. Admission 10/21 for a/c HF w/ low output requiring milrinone. Echo showed EF 15-20% with mod-severe RV dilation and moderate RV dysfunction, D-shaped septum, dilated IVC, severe biatrial enlargement. Significant RV failure with ascites on CT abdomen, had paracentesis.  Cardiac MRI showed LV EF 24%, RV mildly dilated with EF 42%, extensive LGE in a non-coronary distribution throughout much of the LV, also involving RV free wall and left  and right atrial walls, ECV 42%. This was suggestive of infiltrative disease. The walls are not thick, which seems to make amyloidosis or Fabry's less likely, but the PYP scan was suggestive of TTR cardiac am

## 2021-03-27 ENCOUNTER — Encounter (HOSPITAL_COMMUNITY): Payer: Self-pay

## 2021-03-27 ENCOUNTER — Encounter (HOSPITAL_COMMUNITY): Payer: Medicare Other | Admitting: Cardiology

## 2021-03-27 ENCOUNTER — Ambulatory Visit (HOSPITAL_COMMUNITY)
Admission: RE | Admit: 2021-03-27 | Discharge: 2021-03-27 | Disposition: A | Discharge status: Home / Self Care | Payer: Medicare Other | Source: Ambulatory Visit | Attending: Cardiology | Admitting: Cardiology

## 2021-03-27 ENCOUNTER — Other Ambulatory Visit: Payer: Self-pay

## 2021-03-27 VITALS — BP 110/78 | HR 84 | Wt 133.0 lb

## 2021-03-27 DIAGNOSIS — Z7901 Long term (current) use of anticoagulants: Secondary | ICD-10-CM | POA: Insufficient documentation

## 2021-03-27 DIAGNOSIS — I5084 End stage heart failure: Secondary | ICD-10-CM | POA: Diagnosis not present

## 2021-03-27 DIAGNOSIS — I428 Other cardiomyopathies: Secondary | ICD-10-CM | POA: Diagnosis not present

## 2021-03-27 DIAGNOSIS — I4892 Unspecified atrial flutter: Secondary | ICD-10-CM | POA: Diagnosis not present

## 2021-03-27 DIAGNOSIS — I5022 Chronic systolic (congestive) heart failure: Secondary | ICD-10-CM | POA: Diagnosis present

## 2021-03-27 DIAGNOSIS — I482 Chronic atrial fibrillation, unspecified: Secondary | ICD-10-CM | POA: Insufficient documentation

## 2021-03-27 DIAGNOSIS — Z87898 Personal history of other specified conditions: Secondary | ICD-10-CM

## 2021-03-27 DIAGNOSIS — I69951 Hemiplegia and hemiparesis following unspecified cerebrovascular disease affecting right dominant side: Secondary | ICD-10-CM | POA: Diagnosis not present

## 2021-03-27 DIAGNOSIS — I69320 Aphasia following cerebral infarction: Secondary | ICD-10-CM

## 2021-03-27 DIAGNOSIS — Z79899 Other long term (current) drug therapy: Secondary | ICD-10-CM | POA: Insufficient documentation

## 2021-03-27 NOTE — Patient Instructions (Signed)
It was great to see you today! ?No medication changes are needed at this time. ? ? ?Your physician recommends that you schedule a follow-up appointment in: 2-3 months with Dr McLean ? ? ?Do the following things EVERYDAY: ?Weigh yourself in the morning before breakfast. Write it down and keep it in a log. ?Take your medicines as prescribed ?Eat low salt foods--Limit salt (sodium) to 2000 mg per day.  ?Stay as active as you can everyday ?Limit all fluids for the day to less than 2 liters ? ?At the Advanced Heart Failure Clinic, you and your health needs are our priority. As part of our continuing mission to provide you with exceptional heart care, we have created designated Provider Care Teams. These Care Teams include your primary Cardiologist (physician) and Advanced Practice Providers (APPs- Physician Assistants and Nurse Practitioners) who all work together to provide you with the care you need, when you need it.  ? ?You may see any of the following providers on your designated Care Team at your next follow up: ?Dr Daniel Bensimhon ?Dr Dalton McLean ?Amy Clegg, NP ?Brittainy Simmons, PA ?Jessica Milford,NP ?Lindsay Finch, PA ?Lauren Kemp, PharmD ? ? ?Please be sure to bring in all your medications bottles to every appointment.  ? ?If you have any questions or concerns before your next appointment please send us a message through mychart or call our office at [phone].   ? ?TO LEAVE A MESSAGE FOR THE NURSE SELECT OPTION 2, PLEASE LEAVE A MESSAGE INCLUDING: ?YOUR NAME ?DATE OF BIRTH ?CALL BACK NUMBER ?REASON FOR CALL**this is important as we prioritize the call backs ? ?YOU WILL RECEIVE A CALL BACK THE SAME DAY AS LONG AS YOU CALL BEFORE 4:00 PM ? ? ?

## 2021-03-30 ENCOUNTER — Other Ambulatory Visit (HOSPITAL_COMMUNITY): Payer: Self-pay | Admitting: Cardiology

## 2021-04-05 ENCOUNTER — Other Ambulatory Visit (HOSPITAL_COMMUNITY): Payer: Self-pay

## 2021-04-06 ENCOUNTER — Other Ambulatory Visit (HOSPITAL_COMMUNITY): Payer: Self-pay

## 2021-04-09 ENCOUNTER — Encounter: Payer: Self-pay | Admitting: Podiatry

## 2021-04-09 ENCOUNTER — Other Ambulatory Visit: Payer: Self-pay | Admitting: *Deleted

## 2021-04-09 ENCOUNTER — Ambulatory Visit (INDEPENDENT_AMBULATORY_CARE_PROVIDER_SITE_OTHER): Payer: Medicare Other | Admitting: Podiatry

## 2021-04-09 DIAGNOSIS — Z8673 Personal history of transient ischemic attack (TIA), and cerebral infarction without residual deficits: Secondary | ICD-10-CM

## 2021-04-09 DIAGNOSIS — M79675 Pain in left toe(s): Secondary | ICD-10-CM

## 2021-04-09 DIAGNOSIS — E1151 Type 2 diabetes mellitus with diabetic peripheral angiopathy without gangrene: Secondary | ICD-10-CM | POA: Diagnosis not present

## 2021-04-09 DIAGNOSIS — M79674 Pain in right toe(s): Secondary | ICD-10-CM

## 2021-04-09 DIAGNOSIS — M2042 Other hammer toe(s) (acquired), left foot: Secondary | ICD-10-CM

## 2021-04-09 DIAGNOSIS — M2041 Other hammer toe(s) (acquired), right foot: Secondary | ICD-10-CM

## 2021-04-09 DIAGNOSIS — B351 Tinea unguium: Secondary | ICD-10-CM | POA: Diagnosis not present

## 2021-04-09 DIAGNOSIS — I4821 Permanent atrial fibrillation: Secondary | ICD-10-CM

## 2021-04-09 MED ORDER — APIXABAN 5 MG PO TABS: ORAL_TABLET | ORAL | 2 refills | Status: DC

## 2021-04-09 NOTE — Telephone Encounter (Signed)
Eliquis 5mg  paper refill request received. Patient is 66 years old, weight-60.3kg, Crea-1.49 on 12/25/2020, Diagnosis-Afib & CVA, and last seen by 12/27/2020, NP on 03/27/2021. Dose is appropriate based on dosing criteria. Will send in refill to requested pharmacy.   ?

## 2021-04-09 NOTE — Progress Notes (Signed)
This patient returns to my office for at risk foot care.  This patient requires this care by a professional since this patient will be at risk due to having type 2 diabetes.  This patient is unable to cut nails himself since the patient cannot reach his nails.These nails are painful walking and wearing shoes. He presents with male caregiver. This patient presents for at risk foot care today.  General Appearance  Alert, conversant and in no acute stress.  Vascular  Dorsalis pedis and posterior tibial  pulses are  weakly palpable  right.  Absent dorsalis pedis and posterior tibial pulses  left.  Capillary return is within normal limits  bilaterally. Temperature is within normal limits  bilaterally.  Neurologic  Senn-Weinstein monofilament wire test within normal limits  bilaterally. Muscle power within normal limits bilaterally.  Nails Thick disfigured discolored nails with subungual debris  from hallux to fifth toes bilaterally. No evidence of bacterial infection or drainage bilaterally.  Orthopedic  No limitations of motion  feet .  No crepitus or effusions noted.  No bony pathology or digital deformities noted.  Skin  normotropic skin with no porokeratosis noted bilaterally.  No signs of infections or ulcers noted.     Onychomycosis  Pain in right toes  Pain in left toes  Consent was obtained for treatment procedures.   Mechanical debridement of nails 1-5  bilaterally performed with a nail nipper.  Filed with dremel without incident.    Return office visit    3 months                  Told patient to return for periodic foot care and evaluation due to potential at risk complications.   Samari Bittinger DPM    

## 2021-04-12 ENCOUNTER — Telehealth (HOSPITAL_COMMUNITY): Payer: Self-pay | Admitting: Cardiology

## 2021-04-12 ENCOUNTER — Other Ambulatory Visit: Payer: Self-pay | Admitting: Cardiovascular Disease

## 2021-04-12 ENCOUNTER — Telehealth (HOSPITAL_COMMUNITY): Payer: Self-pay | Admitting: Adult Health

## 2021-04-12 DIAGNOSIS — D72829 Elevated white blood cell count, unspecified: Secondary | ICD-10-CM

## 2021-04-12 NOTE — Telephone Encounter (Signed)
? ?  Discussed abnormal lab work with [name].  ? ?Mr [name] is inotrope dependent and NYHA IV prior to milrinone and now NYHA II on milrinone.  ? ?WBC 15 ? ?Instructed to remove PICC, place piv for milrinone, and obtain blood cultures.  ? ?Continue milrinone through PIV until blood cx result.  ? ?Clinic staff called and set up blood cultures.  ? ?Christopher Lorenz NP-C  ?5:22 PM ? ?

## 2021-04-12 NOTE — Telephone Encounter (Signed)
Abnormal labs received ?WBC 15.4 ? ?Per Amy Clegg,NP ?PICC line removed patient to return for cultures ASAP ? ? ?[name] with [facility] aware and will have PICC removed 04/12/21, milrinone to run thru peripheral line. ? ?Pts niece aware of results, will have patient here for labs on 4/7. Will return call if time doesn't work for patient/transportation  ?

## 2021-04-13 ENCOUNTER — Ambulatory Visit (HOSPITAL_COMMUNITY)
Admission: RE | Admit: 2021-04-13 | Discharge: 2021-04-13 | Disposition: A | Discharge status: Home / Self Care | Payer: Medicare Other | Source: Ambulatory Visit | Attending: Internal Medicine | Admitting: Internal Medicine

## 2021-04-13 DIAGNOSIS — D72829 Elevated white blood cell count, unspecified: Secondary | ICD-10-CM

## 2021-04-13 NOTE — Telephone Encounter (Signed)
Blood cultures drawn today will await results ?

## 2021-04-20 ENCOUNTER — Other Ambulatory Visit (HOSPITAL_COMMUNITY): Payer: Self-pay | Admitting: Cardiology

## 2021-05-01 ENCOUNTER — Other Ambulatory Visit (HOSPITAL_COMMUNITY): Payer: Self-pay | Admitting: Cardiology

## 2021-05-11 ENCOUNTER — Other Ambulatory Visit (HOSPITAL_COMMUNITY): Payer: Self-pay | Admitting: Cardiology

## 2021-05-15 ENCOUNTER — Other Ambulatory Visit (HOSPITAL_COMMUNITY): Payer: Self-pay

## 2021-05-23 ENCOUNTER — Other Ambulatory Visit (HOSPITAL_COMMUNITY): Payer: Self-pay | Admitting: Cardiology

## 2021-05-24 ENCOUNTER — Other Ambulatory Visit (HOSPITAL_COMMUNITY): Payer: Self-pay

## 2021-05-28 ENCOUNTER — Other Ambulatory Visit (HOSPITAL_COMMUNITY): Payer: Self-pay

## 2021-05-31 ENCOUNTER — Other Ambulatory Visit (HOSPITAL_COMMUNITY): Payer: Self-pay

## 2021-06-11 ENCOUNTER — Ambulatory Visit (HOSPITAL_COMMUNITY)
Admission: RE | Admit: 2021-06-11 | Discharge: 2021-06-11 | Disposition: A | Discharge status: Home / Self Care | Payer: Medicare Other | Source: Ambulatory Visit | Attending: Cardiology | Admitting: Cardiology

## 2021-06-11 ENCOUNTER — Encounter (HOSPITAL_COMMUNITY): Payer: Self-pay | Admitting: Cardiology

## 2021-06-11 VITALS — BP 90/50 | HR 60 | Wt 123.8 lb

## 2021-06-11 DIAGNOSIS — I5022 Chronic systolic (congestive) heart failure: Secondary | ICD-10-CM | POA: Diagnosis present

## 2021-06-11 DIAGNOSIS — I43 Cardiomyopathy in diseases classified elsewhere: Secondary | ICD-10-CM | POA: Diagnosis not present

## 2021-06-11 DIAGNOSIS — Z7901 Long term (current) use of anticoagulants: Secondary | ICD-10-CM | POA: Insufficient documentation

## 2021-06-11 DIAGNOSIS — I482 Chronic atrial fibrillation, unspecified: Secondary | ICD-10-CM | POA: Insufficient documentation

## 2021-06-11 DIAGNOSIS — I69320 Aphasia following cerebral infarction: Secondary | ICD-10-CM | POA: Diagnosis not present

## 2021-06-11 DIAGNOSIS — R188 Other ascites: Secondary | ICD-10-CM | POA: Diagnosis not present

## 2021-06-11 DIAGNOSIS — R531 Weakness: Secondary | ICD-10-CM | POA: Diagnosis not present

## 2021-06-11 DIAGNOSIS — I5084 End stage heart failure: Secondary | ICD-10-CM | POA: Diagnosis not present

## 2021-06-11 DIAGNOSIS — Z79899 Other long term (current) drug therapy: Secondary | ICD-10-CM | POA: Insufficient documentation

## 2021-06-11 DIAGNOSIS — I428 Other cardiomyopathies: Secondary | ICD-10-CM | POA: Insufficient documentation

## 2021-06-11 LAB — CBC
HCT: 35.3 % — ABNORMAL LOW (ref 39.0–52.0)
Hemoglobin: 11 g/dL — ABNORMAL LOW (ref 13.0–17.0)
MCH: 27.6 pg (ref 26.0–34.0)
MCHC: 31.2 g/dL (ref 30.0–36.0)
MCV: 88.7 fL (ref 80.0–100.0)
Platelets: 156 10*3/uL (ref 150–400)
RBC: 3.98 MIL/uL — ABNORMAL LOW (ref 4.22–5.81)
RDW: 15.5 % (ref 11.5–15.5)
WBC: 5.1 10*3/uL (ref 4.0–10.5)
nRBC: 0 % (ref 0.0–0.2)

## 2021-06-11 LAB — BASIC METABOLIC PANEL
Anion gap: 8 (ref 5–15)
BUN: 15 mg/dL (ref 8–23)
CO2: 24 mmol/L (ref 22–32)
Calcium: 9.3 mg/dL (ref 8.9–10.3)
Chloride: 101 mmol/L (ref 98–111)
Creatinine, Ser: 1.24 mg/dL (ref 0.61–1.24)
GFR, Estimated: >60 mL/min (ref >60)
Glucose, Bld: 110 mg/dL — ABNORMAL HIGH (ref 70–99)
Potassium: 4 mmol/L (ref 3.5–5.1)
Sodium: 133 mmol/L — ABNORMAL LOW (ref 135–145)

## 2021-06-11 LAB — BRAIN NATRIURETIC PEPTIDE: B Natriuretic Peptide: 270.3 pg/mL — ABNORMAL HIGH (ref 0.0–100.0)

## 2021-06-11 MED ORDER — TORSEMIDE 20 MG PO TABS: 40.0000 mg | ORAL_TABLET | Freq: Two times a day (BID) | ORAL | 3 refills | Status: DC

## 2021-06-11 MED ORDER — TORSEMIDE 20 MG PO TABS: 40.0000 mg | ORAL_TABLET | Freq: Every day | ORAL | 3 refills | Status: DC

## 2021-06-11 MED ORDER — DAPAGLIFLOZIN PROPANEDIOL 10 MG PO TABS: 10.0000 mg | ORAL_TABLET | Freq: Every day | ORAL | 3 refills | Status: DC

## 2021-06-11 NOTE — Progress Notes (Signed)
[facility] faxed to [facility] for lab work in 10 days

## 2021-06-11 NOTE — Patient Instructions (Signed)
Start Farxgia 10mg  daily.  Change Torsemide to 40mg  daily   Labs done today, your results will be available in MyChart, we will contact you for abnormal readings.  Repeat blood work in 10 days.  Your physician has requested that you have an echocardiogram. Echocardiography is a painless test that uses sound waves to create images of your heart. It provides your doctor with information about the size and shape of your heart and how well your heart's chambers and valves are working. This procedure takes approximately one hour. There are no restrictions for this procedure.  Your physician recommends that you schedule a follow-up appointment in: 2 months   If you have any questions or concerns before your next appointment please send a message through [place] or call our office at [phone].    TO LEAVE A MESSAGE FOR THE NURSE SELECT OPTION 2, PLEASE LEAVE A MESSAGE INCLUDING: YOUR NAME DATE OF BIRTH CALL BACK NUMBER REASON FOR CALL**this is important as we prioritize the call backs  YOU WILL RECEIVE A CALL BACK THE SAME DAY AS LONG AS YOU CALL BEFORE 4:00 PM  At the Advanced [facility] Failure Clinic, you and your health needs are our priority. As part of our continuing mission to provide you with exceptional heart care, we have created designated Provider Care Teams. These Care Teams include your primary Cardiologist (physician) and Advanced Practice Providers (APPs- Physician Assistants and Nurse Practitioners) who all work together to provide you with the care you need, when you need it.   You may see any of the following providers on your designated Care Team at your next follow up: Dr [address] [address] [phone], NP Arvilla Meres, [facility] [place], [facility] [place], PharmD   Please be sure to bring in all your medications bottles to every appointment.

## 2021-06-11 NOTE — Progress Notes (Signed)
[facility] Note   Referring Physician: PCP: Iona Beard, MD PCP-Cardiologist: Lauree Chandler, MD  HF Cardiology: Dr. Aundra Dubin   HPI:  66 y.o. with history of chronic atrial fibrillation and end stage nonischemic cardiomyopathy returns for followup of CHF.   Patient had CVA in 2010, since that time has had expressive aphasia and right-sided weakness.  He lives with his brother.  In 8/18, he was admited with new atrial flutter and EF was found to be 20%.  He had cath in 8/18 showing mild nonobstructive coronary disease.  In 10/18, he had DCCV to NSR.  In 11/18 while in NSR, echo showed EF up to 45-50%.  By 2019, he was back in atrial fibrillation.  He was not cardioverted again. Echo in 4/21 showed EF 20-25%.     Patient was admitted in 10/21 with h/o increased dyspnea with exertion and developed abdominal swelling.  Failed outpatient diuretic titration. Admitted for IV diuretics. Echo repeated and EF was 20% with mod-severe RV dilation and moderate RV dysfunction, D-shaped septum, dilated IVC, severe biatrial enlargement.  He was in atrial fibrillation in 50s-60s. CT of abdomen showed large volume ascites, had paracentesis with 4.8 L out.  Cardiac MRI showed LV EF 24%, RV mildly dilated with EF 42%, extensive LGE in a non-coronary distribution throughout much of the LV, also involving RV free wall and left and right atrial walls, ECV 42%. This was suggestive of infiltrative disease. 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  He was initially on milrinone for low output HF, this was weaned off for DCCV which he failed. [place] 11/11/19 off milrinone showed optimized filling pressures but low cardiac output, milrinone restarted at 0.125. Discharged w/ home milrinone + home health.  He remained in atrial fibrillation.   He was not thought to be a candidate for LVAD.   Echo in 6/22 showd EF 25-30%, mild LV dilation, mild LVH, moderately decreased RV systolic function, IVC dilated, PASP 49 mmHg.   He returns for followup of end-stage CHF.  Weight is down 10 lbs.  History comes mainly from family because of aphasia.  He remains on milrinone 0.125.  He is doing well overall on milrinone. No dyspnea walking on flat ground.  No chest pain.  No orthopnea/PND.  Good appetite.  No lightheadedness/syncope.  No peripheral neuropathy symptoms.    Labs (1/22): K 3.8, creatinine 1.2 Labs (4/22): K 4.2, creatinine 1.27 Labs (7/22): K 4.6, creatinine 1.47 Labs (5/23): K 3.8, creatinine 1.2, hgb 10.3  Review of systems complete and found to be negative unless listed in HPI.    PMH: 1. 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  2. DM2 3. Atrial fibrillation: Chronic. Has failed DCCV and amiodarone.  4. Chronic systolic CHF: End-stage nonischemic cardiomyopathy.   - LHC (8/18): Mild nonobstructive CAD.  - RHC (11/21): mean RA 7, PA 29/9, mean PCWP 8, CI 1.6 (thermodilution)/2.5 (Fick) - Echo (11/21): EF 15-20%, D-shaped interventricular septum, severe dilated and severely dysfunctional RV, severe biatrial enlargement.  - Cardiac MRI (11/21): LV EF 24%, RV mildly dilated with EF 42%, extensive LGE in a non-coronary distribution throughout much of the LV, also involving RV free wall and left and right atrial walls, ECV 42%. This was suggestive of infiltrative disease. 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  - milrinone dependent - Echo (6/22): EF 25-30%, mild LV dilation, mild LVH, moderately decreased RV systolic function, IVC dilated, PASP 49 mmHg.   Current Outpatient Medications  Medication Sig Dispense Refill   Accu-Chek FastClix Lancets MISC 1 each by Other route as directed.     ACCU-CHEK GUIDE test strip 1 each  by Other route See admin instructions.     acetaminophen (TYLENOL) 500 MG tablet Take 1,000 mg by mouth every 6 (six) hours as needed for pain.     apixaban (ELIQUIS) 5 MG TABS tablet TAKE 1 TABLET(5 MG) BY MOUTH TWICE DAILY 180 tablet 2   dapagliflozin propanediol (FARXIGA) 10 MG TABS tablet Take 1 tablet (10 mg total) by mouth daily before breakfast. 90 tablet 3   milrinone (PRIMACOR) 20 MG/100 ML SOLN infusion Inject 0.0139 mg/min into the vein continuous. 100 mL 0   Multiple Vitamin (MULTIVITAMIN WITH MINERALS) TABS Take 1 tablet by mouth daily.     omega-3 acid ethyl esters (LOVAZA) 1 G capsule Take 1 g by mouth daily.     potassium chloride SA (KLOR-CON M) 20 MEQ tablet Take 0.5 tablets (10 mEq total) by mouth daily. 90 tablet 3   simvastatin (ZOCOR) 40 MG tablet Take 40 mg by mouth daily.     spironolactone (ALDACTONE) 25 MG tablet Please schedule appt for future refills. 1st attempt 90 tablet 0   Tafamidis (VYNDAMAX) 61 MG CAPS Take 1 capsule by mouth daily. 90 capsule 3   TRADJENTA 5 MG TABS tablet Take 5 mg by mouth every morning.     torsemide (DEMADEX) 20 MG tablet Take 2 tablets (40 mg total) by mouth daily. 270 tablet 3   No current facility-administered medications for this encounter.    No Known Allergies    Social History   Socioeconomic History   Marital status: Single    Spouse name: Not on file   Number of children: Not on file   Years of education: Not on file   Highest education level: Not on file  Occupational History   Not on file  Tobacco Use   Smoking status: Never   Smokeless tobacco: Never  Vaping Use   Vaping Use: Never used  Substance and Sexual Activity   Alcohol use: No   Drug use: No   Sexual activity: Not Currently  Other Topics Concern   Not on file  Social History Narrative   Not on file   Social Determinants of Health   Financial Resource Strain: Not on file  Food Insecurity: Not on file  Transportation Needs: Not on file  Physical  Activity: Not on file  Stress: Not on file  Social Connections: Not on file  Intimate Partner Violence: Not on file      Family History  Problem Relation Age of Onset   Hypertension Mother    Diabetes Mother    Heart disease Father     Vitals:   06/11/21 1120  BP: (!) 90/50  Pulse: 60  SpO2: 99%  Weight: 56.2 kg (123 lb 12.8 oz)   PHYSICAL EXAM: General: NAD Neck: No JVD, no thyromegaly or thyroid nodule.  Lungs: Clear to auscultation bilaterally with normal respiratory effort. CV: Nondisplaced PMI.  Heart irregular S1/S2, no S3/S4, no murmur.  No peripheral edema.  No carotid bruit.  Normal pedal pulses.  Abdomen: Soft, nontender, no hepatosplenomegaly, no distention.  Skin: Intact without lesions or rashes.  Neurologic: Alert and oriented x  3. Expressive aphasia.  Right arm weak.  Psych: Normal affect. Extremities: No clubbing or cyanosis.  HEENT: Normal.   ASSESSMENT & PLAN:  1. Chronic systolic CHF: Nonischemic cardiomyopathy known since 2018.  Improved initially in 2018 with DCCV, but has been back in atrial fibrillation since 2019 it appears and EF has been low. Admission 10/21 for a/c HF w/ low output requiring milrinone. Echo showed EF 15-20% with mod-severe RV dilation and moderate RV dysfunction, D-shaped septum, dilated IVC, severe biatrial enlargement. Significant RV failure with ascites on CT abdomen, had paracentesis.  Cardiac MRI showed LV EF 24%, RV mildly dilated with EF 42%, extensive LGE in a non-coronary distribution throughout much of the LV, also involving RV free wall and left and right atrial walls, ECV 42%. This was suggestive of infiltrative disease. 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  He was initially on milrinone for low output HF,  this was weaned off for DCCV which he failed. [place] 11/11/19 off milrinone showed optimized filling pressures but low cardiac output, milrinone restarted and has been continued since that time. He was sent home in 11/21 on hospice but hospice has released him. Echo in 6/22 with EF 25-30%, moderately decreased RV function, dilated IVC.  He is not volume overloaded on exam.  NYHA class II.   - Continue home milrinone 0.125, he has done quite well with this.    - Decrease torsemide to 40 mg daily and add Farxiga 10 mg daily.  BMET today and in 10 days.     - Continue Spiro 25 mg daily  - BP too low for ARB/ARNi - No digoxin with history of bradycardia.   - Narrow QRS, not CRT candidate.  - Not a transplant candidate and not a good candidate for LVAD. Patient assessed by our LVAD nurse, she had significant concerns about his right arm/hand weakness.  He would be unable to manage his LVAD equipment and would need consistent 24 hr caregiver (not available).       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  He does not have symptoms of peripheral neuropathy.  2. Atrial fibrillation: Now chronic.  Initially in 2018, EF improved after DCCV.  However, as above, he has been relatively well rate controlled and has not been in RVR, so do not think that cardiomyopathy is primarily due to AF.  He failed DCCV on amiodarone 10/21, amiodarone stopped due to bradycardia.  - Continue Eliquis. CBC today.   3. Ascites: Suspect due to RV failure. S/p Paracentesis on 10/31 with 4.8 L out. No evidence of recurrence on exam today  4. H/o CVA: In 2010, with chronic expressive aphasia and right-sided weakness. Stable.   Followup APP in 2 months.   Loralie Champagne, MD 06/11/21

## 2021-06-12 ENCOUNTER — Encounter (HOSPITAL_COMMUNITY): Payer: Medicare Other | Admitting: Cardiology

## 2021-06-14 ENCOUNTER — Other Ambulatory Visit (HOSPITAL_COMMUNITY): Payer: Self-pay | Admitting: Cardiology

## 2021-06-18 ENCOUNTER — Telehealth (HOSPITAL_COMMUNITY): Payer: Self-pay | Admitting: Cardiology

## 2021-06-18 NOTE — Telephone Encounter (Signed)
Pts brother returned call and asked to call patients daughter since she manages pts medications. I called her at [phone] no answer/unable to leave vm.

## 2021-06-18 NOTE — Telephone Encounter (Signed)
Abnormal labs recevied from [facility] Labs drawn 06/14/21 K 3.6  Per Anna Genre PA Increase potassium by 20 meq  LMOM

## 2021-06-19 ENCOUNTER — Other Ambulatory Visit (HOSPITAL_COMMUNITY): Payer: Self-pay | Admitting: Cardiology

## 2021-06-19 MED ORDER — POTASSIUM CHLORIDE CRYS ER 20 MEQ PO TBCR
EXTENDED_RELEASE_TABLET | ORAL | 3 refills | Status: DC
Start: 2021-06-19 — End: 2021-07-17

## 2021-06-19 NOTE — Telephone Encounter (Signed)
Pt aware via niece Meds updated

## 2021-06-21 ENCOUNTER — Other Ambulatory Visit (HOSPITAL_COMMUNITY): Payer: Self-pay

## 2021-06-25 ENCOUNTER — Ambulatory Visit (HOSPITAL_COMMUNITY)
Admission: RE | Admit: 2021-06-25 | Discharge: 2021-06-25 | Disposition: A | Discharge status: Home / Self Care | Payer: Medicare Other | Source: Ambulatory Visit | Attending: Cardiology | Admitting: Cardiology

## 2021-06-25 DIAGNOSIS — E119 Type 2 diabetes mellitus without complications: Secondary | ICD-10-CM | POA: Diagnosis not present

## 2021-06-25 DIAGNOSIS — I110 Hypertensive heart disease with heart failure: Secondary | ICD-10-CM | POA: Diagnosis not present

## 2021-06-25 DIAGNOSIS — I5022 Chronic systolic (congestive) heart failure: Secondary | ICD-10-CM

## 2021-06-25 DIAGNOSIS — I4892 Unspecified atrial flutter: Secondary | ICD-10-CM | POA: Insufficient documentation

## 2021-06-25 DIAGNOSIS — Z8673 Personal history of transient ischemic attack (TIA), and cerebral infarction without residual deficits: Secondary | ICD-10-CM | POA: Diagnosis not present

## 2021-06-25 DIAGNOSIS — I429 Cardiomyopathy, unspecified: Secondary | ICD-10-CM | POA: Diagnosis not present

## 2021-06-25 LAB — ECHOCARDIOGRAM COMPLETE
Area-P 1/2: 1.89 cm2
Calc EF: 29.3 %
S' Lateral: 5 cm
Single Plane A2C EF: 33.6 %
Single Plane A4C EF: 31 %

## 2021-06-25 NOTE — Progress Notes (Signed)
  Echocardiogram 2D Echocardiogram has been performed.  [name] 06/25/2021, 10:55 AM

## 2021-07-16 ENCOUNTER — Ambulatory Visit: Payer: Medicare Other | Admitting: Podiatry

## 2021-07-16 ENCOUNTER — Other Ambulatory Visit (HOSPITAL_COMMUNITY): Payer: Self-pay | Admitting: Cardiology

## 2021-07-17 ENCOUNTER — Other Ambulatory Visit (HOSPITAL_COMMUNITY): Payer: Self-pay | Admitting: *Deleted

## 2021-07-17 MED ORDER — POTASSIUM CHLORIDE CRYS ER 20 MEQ PO TBCR
EXTENDED_RELEASE_TABLET | ORAL | 3 refills | Status: DC
Start: 2021-07-17 — End: 2022-04-15

## 2021-07-18 ENCOUNTER — Ambulatory Visit (INDEPENDENT_AMBULATORY_CARE_PROVIDER_SITE_OTHER): Payer: Medicare Other | Admitting: Podiatry

## 2021-07-18 ENCOUNTER — Encounter: Payer: Self-pay | Admitting: Podiatry

## 2021-07-18 DIAGNOSIS — Z8673 Personal history of transient ischemic attack (TIA), and cerebral infarction without residual deficits: Secondary | ICD-10-CM

## 2021-07-18 DIAGNOSIS — B351 Tinea unguium: Secondary | ICD-10-CM

## 2021-07-18 DIAGNOSIS — M79675 Pain in left toe(s): Secondary | ICD-10-CM | POA: Diagnosis not present

## 2021-07-18 DIAGNOSIS — E1151 Type 2 diabetes mellitus with diabetic peripheral angiopathy without gangrene: Secondary | ICD-10-CM | POA: Diagnosis not present

## 2021-07-18 DIAGNOSIS — M79674 Pain in right toe(s): Secondary | ICD-10-CM

## 2021-07-18 NOTE — Progress Notes (Signed)
This patient returns to my office for at risk foot care.  This patient requires this care by a professional since this patient will be at risk due to having type 2 diabetes.  This patient is unable to cut nails himself since the patient cannot reach his nails.These nails are painful walking and wearing shoes. He presents with male caregiver. This patient presents for at risk foot care today.  General Appearance  Alert, conversant and in no acute stress.  Vascular  Dorsalis pedis and posterior tibial  pulses are  weakly palpable  right.  Absent dorsalis pedis and posterior tibial pulses  left.  Capillary return is within normal limits  bilaterally. Temperature is within normal limits  bilaterally.  Neurologic  Senn-Weinstein monofilament wire test within normal limits  bilaterally. Muscle power within normal limits bilaterally.  Nails Thick disfigured discolored nails with subungual debris  from hallux to fifth toes bilaterally. No evidence of bacterial infection or drainage bilaterally.  Orthopedic  No limitations of motion  feet .  No crepitus or effusions noted.  No bony pathology or digital deformities noted.  Skin  normotropic skin with no porokeratosis noted bilaterally.  No signs of infections or ulcers noted.     Onychomycosis  Pain in right toes  Pain in left toes  Consent was obtained for treatment procedures.   Mechanical debridement of nails 1-5  bilaterally performed with a nail nipper.  Filed with dremel without incident.    Return office visit    3 months                  Told patient to return for periodic foot care and evaluation due to potential at risk complications.   Idris Edmundson DPM    

## 2021-07-24 ENCOUNTER — Other Ambulatory Visit (HOSPITAL_COMMUNITY): Payer: Self-pay | Admitting: *Deleted

## 2021-07-24 MED ORDER — SPIRONOLACTONE 25 MG PO TABS: 25.0000 mg | ORAL_TABLET | Freq: Every day | ORAL | 3 refills | Status: DC

## 2021-08-02 ENCOUNTER — Other Ambulatory Visit (HOSPITAL_COMMUNITY): Payer: Self-pay | Admitting: Cardiology

## 2021-08-13 ENCOUNTER — Encounter (HOSPITAL_COMMUNITY): Payer: Self-pay

## 2021-08-13 ENCOUNTER — Ambulatory Visit (HOSPITAL_COMMUNITY)
Admission: RE | Admit: 2021-08-13 | Discharge: 2021-08-13 | Disposition: A | Discharge status: Home / Self Care | Payer: Medicare Other | Source: Ambulatory Visit | Attending: Cardiology | Admitting: Cardiology

## 2021-08-13 VITALS — BP 102/68 | HR 66 | Wt 125.2 lb

## 2021-08-13 DIAGNOSIS — I69320 Aphasia following cerebral infarction: Secondary | ICD-10-CM | POA: Insufficient documentation

## 2021-08-13 DIAGNOSIS — Z79899 Other long term (current) drug therapy: Secondary | ICD-10-CM | POA: Insufficient documentation

## 2021-08-13 DIAGNOSIS — I428 Other cardiomyopathies: Secondary | ICD-10-CM | POA: Insufficient documentation

## 2021-08-13 DIAGNOSIS — Z7901 Long term (current) use of anticoagulants: Secondary | ICD-10-CM | POA: Diagnosis not present

## 2021-08-13 DIAGNOSIS — Z7984 Long term (current) use of oral hypoglycemic drugs: Secondary | ICD-10-CM | POA: Insufficient documentation

## 2021-08-13 DIAGNOSIS — R531 Weakness: Secondary | ICD-10-CM | POA: Insufficient documentation

## 2021-08-13 DIAGNOSIS — I5022 Chronic systolic (congestive) heart failure: Secondary | ICD-10-CM | POA: Diagnosis present

## 2021-08-13 DIAGNOSIS — R188 Other ascites: Secondary | ICD-10-CM | POA: Diagnosis not present

## 2021-08-13 DIAGNOSIS — I482 Chronic atrial fibrillation, unspecified: Secondary | ICD-10-CM | POA: Insufficient documentation

## 2021-08-13 DIAGNOSIS — I43 Cardiomyopathy in diseases classified elsewhere: Secondary | ICD-10-CM | POA: Insufficient documentation

## 2021-08-13 NOTE — Progress Notes (Signed)
ReDS Vest / Clip - 08/13/21 1359       ReDS Vest / Clip   Station Marker C    Ruler Value 27    ReDS Value Range Low volume    ReDS Actual Value 24    Anatomical Comments sitting

## 2021-08-13 NOTE — Progress Notes (Addendum)
Advanced Heart Failure Clinic Note   Referring Physician: PCP: Mirna Mires, MD PCP-Cardiologist: Verne Carrow, MD  HF Cardiology: Dr. Shirlee Latch   HPI:  66 y.o. with history of chronic atrial fibrillation and end stage nonischemic cardiomyopathy returns for followup of CHF.   Patient had CVA in 2010, since that time has had expressive aphasia and right-sided weakness.  He lives with his brother.  In 8/18, he was admited with new atrial flutter and EF was found to be 20%.  He had cath in 8/18 showing mild nonobstructive coronary disease.  In 10/18, he had DCCV to NSR.  In 11/18 while in NSR, echo showed EF up to 45-50%.  By 2019, he was back in atrial fibrillation.  He was not cardioverted again. Echo in 4/21 showed EF 20-25%.     Patient was admitted in 10/21 with h/o increased dyspnea with exertion and developed abdominal swelling.  Failed outpatient diuretic titration. Admitted for IV diuretics. Echo repeated and EF was 20% with mod-severe RV dilation and moderate RV dysfunction, D-shaped septum, dilated IVC, severe biatrial enlargement.  He was in atrial fibrillation in 50s-60s. CT of abdomen showed large volume ascites, had paracentesis with 4.8 L out.  Cardiac MRI showed LV EF 24%, RV mildly dilated with EF 42%, extensive LGE in a non-coronary distribution throughout much of the LV, also involving RV free wall and left and right atrial walls, ECV 42%. This was suggestive of infiltrative disease. 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  He was initially on milrinone for low output HF, this was weaned off for DCCV which he failed. RHC 11/11/19 off milrinone showed optimized filling pressures but low cardiac output, milrinone restarted at 0.125. Discharged w/ home milrinone + home health.  He remained in atrial fibrillation.   He was not thought to be a candidate for LVAD.   Echo in 6/22 showd EF 25-30%, mild LV dilation, mild LVH, moderately decreased RV systolic function, IVC dilated, PASP 49 mmHg.   Seen by Dr. Shirlee Latch 6/22. At visit, was doing relatively well and still on home milrinone. Farxiga 10 mg was added and torsemide reduced down to 40 mg once daily. Echo was repeated. EF 20-25%, RV moderately reduced. Instructed to return in 2 months for f/u.   Presents today for f/u. Here w/ his brother who serves as one of his caregivers and helps w/ meds. Pt doing well. Remains on home milrinone. Home health follows labs every other week. I have reviewed labs and SCr and K have remained stable and WNL. NYHA Class II. No LEE. Denies orthopnea/PND. BP 102/68. No orthostatic symptoms. ReDS 24%.     Labs (1/22): K 3.8, creatinine 1.2 Labs (4/22): K 4.2, creatinine 1.27 Labs (7/22): K 4.6, creatinine 1.47 Labs (5/23): K 3.8, creatinine 1.2, hgb 10.3 Labs (6/23): K 4.0, creatinine 1.24, hgb 11.0 Labs (7/23) K 4.3, creatinine 1.30   Review of systems complete and found to be negative unless listed in HPI.    PMH: 1. 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  2. DM2 3. Atrial fibrillation: Chronic. Has failed DCCV and amiodarone.  4. Chronic systolic CHF: End-stage nonischemic cardiomyopathy.   - LHC (8/18): Mild nonobstructive CAD.  - RHC (11/21): mean RA 7, PA 29/9, mean PCWP 8, CI 1.6 (thermodilution)/2.5 (Fick) - Echo (11/21): EF 15-20%, D-shaped interventricular septum, severe dilated  and severely dysfunctional RV, severe biatrial enlargement.  - Cardiac MRI (11/21): LV EF 24%, RV mildly dilated with EF 42%, extensive LGE in a non-coronary distribution throughout much of the LV, also involving RV free wall and left and right atrial walls, ECV 42%. This was suggestive of infiltrative disease. 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  - milrinone dependent - Echo (6/22): EF 25-30%, mild LV dilation, mild LVH, moderately decreased RV systolic function, IVC dilated, PASP 49 mmHg.   Current Outpatient Medications  Medication Sig Dispense Refill   Accu-Chek FastClix Lancets MISC 1 each by Other route as directed.     ACCU-CHEK GUIDE test strip 1 each by Other route See admin instructions.     acetaminophen (TYLENOL) 500 MG tablet Take 1,000 mg by mouth every 6 (six) hours as needed for pain.     apixaban (ELIQUIS) 5 MG TABS tablet TAKE 1 TABLET(5 MG) BY MOUTH TWICE DAILY 180 tablet 2   dapagliflozin propanediol (FARXIGA) 10 MG TABS tablet Take 1 tablet (10 mg total) by mouth daily before breakfast. 90 tablet 3   milrinone (PRIMACOR) 20 MG/100 ML SOLN infusion Inject 0.0139 mg/min into the vein continuous. 100 mL 0   Multiple Vitamin (MULTIVITAMIN WITH MINERALS) TABS Take 1 tablet by mouth daily.     omega-3 acid ethyl esters (LOVAZA) 1 G capsule Take 1 g by mouth daily.     potassium chloride SA (KLOR-CON M) 20 MEQ tablet Take 1 tablet (20 mEq total) by mouth in the morning AND 0.5 tablets (10 mEq total) every evening. 135 tablet 3   simvastatin (ZOCOR) 40 MG tablet Take 40 mg by mouth daily.     spironolactone (ALDACTONE) 25 MG tablet Take 1 tablet (25 mg total) by mouth daily. 90 tablet 3   Tafamidis (VYNDAMAX) 61 MG CAPS Take 1 capsule by mouth daily. 90 capsule 3   torsemide (DEMADEX) 20 MG tablet Take 2 tablets (40 mg total) by mouth daily. 270 tablet 3   TRADJENTA 5 MG TABS tablet Take 5 mg by mouth every morning.     No current facility-administered medications for this encounter.    No Known Allergies    Social History   Socioeconomic History   Marital status: Single    Spouse name: Not on file   Number of children: Not on file   Years of education: Not on file   Highest education level: Not on file  Occupational History   Not on file  Tobacco Use   Smoking status: Never   Smokeless  tobacco: Never  Vaping Use   Vaping Use: Never used  Substance and Sexual Activity   Alcohol use: No   Drug use: No   Sexual activity: Not Currently  Other Topics Concern   Not on file  Social History Narrative   Not on file   Social Determinants of Health   Financial Resource Strain: Not on file  Food Insecurity: Not on file  Transportation Needs: Not on file  Physical Activity: Not on file  Stress: Not on file  Social Connections: Not on file  Intimate Partner Violence: Not on file      Family History  Problem Relation Age of Onset   Hypertension Mother    Diabetes Mother    Heart disease Father     Vitals:   08/13/21 1338  BP: 102/68  Pulse: 66  SpO2: 99%  Weight:  56.8 kg (125 lb 3.2 oz)    PHYSICAL EXAM: ReDS 24%  General:  Well appearing, thin. No respiratory difficulty HEENT: normal Neck: supple. no JVD. Carotids 2+ bilat; no bruits. No lymphadenopathy or thyromegaly appreciated. Cor: PMI nondisplaced. Regular rate & rhythm. No rubs, gallops or murmurs. Lungs: clear Abdomen: soft, nontender, nondistended. No hepatosplenomegaly. No bruits or masses. Good bowel sounds. Extremities: no cyanosis, clubbing, rash, edema Neuro: alert & oriented x 3, cranial nerves grossly intact. moves all 4 extremities w/o difficulty. Affect pleasant.   ASSESSMENT & PLAN:  1. Chronic systolic CHF: Nonischemic cardiomyopathy known since 2018.  Improved initially in 2018 with DCCV, but has been back in atrial fibrillation since 2019 it appears and EF has been low. Admission 10/21 for a/c HF w/ low output requiring milrinone. Echo showed EF 15-20% with mod-severe RV dilation and moderate RV dysfunction, D-shaped septum, dilated IVC, severe biatrial enlargement. Significant RV failure with ascites on CT abdomen, had paracentesis.  Cardiac MRI showed LV EF 24%, RV mildly dilated with EF 42%, extensive LGE in a non-coronary distribution throughout much of the LV, also involving RV free  wall and left and right atrial walls, ECV 42%. This was suggestive of infiltrative disease. 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  He was initially on milrinone for low output HF, this was weaned off for DCCV which he failed. [facility]HC 11/11/19 off milrinone showed optimized filling pressures but low cardiac output, milrinone restarted and has been continued since that time. He was sent home in 11/21 on hospice but hospice has released him. Echo in 6/22 with EF 25-30%, moderately decreased RV function, dilated IVC. Echo 6/23 EF 20-25%, RV moderately reduced.  - NYHA Class II. Euvolemic on exam. ReDs 24%  - Continue home milrinone 0.125. Home health following  - Continue Farxiga 10 mg daily    - Continue Spiro 25 mg daily  - Continue Torsemide 40 mg daily  - BP too low for ARB/ARNi - No digoxin with history of bradycardia.   - Narrow QRS, not CRT candidate.  - Not a transplant candidate and not a good candidate for LVAD. Patient assessed by our LVAD nurse, she had significant concerns about his right arm/hand weakness.  He would be unable to manage his LVAD equipment and would need consistent 24 hr caregiver (not available).       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  He does not have symptoms of peripheral neuropathy.  -  labs followed biweekly by home health. SCr historically has remained stable over last several weeks (personally reviewed) 2. Atrial fibrillation: Now chronic.  Initially in 2018, EF improved after DCCV.  However, as above, he has been relatively well rate controlled and has not been in RVR, so do not think that cardiomyopathy is primarily due to AF.  He failed DCCV on amiodarone 10/21, amiodarone stopped due to bradycardia.  -  Continue Eliquis 5 mg bid. Denies abnormal bleeding  3. Ascites: Suspect due to RV failure. S/p Paracentesis on 10/31 with 4.8 L out. No evidence of recurrence on exam today  4. H/o CVA: In 2010, with chronic expressive aphasia and right-sided weakness. Stable.   Followup w/ Dr. Shirlee Latch in 2 months.   Robbie Lis, PA-C 08/13/21

## 2021-08-13 NOTE — Patient Instructions (Signed)
Thank you for coming in today  No labs today    Do the following things EVERYDAY: Weigh yourself in the morning before breakfast. Write it down and keep it in a log. Take your medicines as prescribed Eat low salt foods--Limit salt (sodium) to 2000 mg per day.  Stay as active as you can everyday Limit all fluids for the day to less than 2 liters  Your physician recommends that you schedule a follow-up appointment in:  October 6th 2023 at 11:40 pm with Dr. Shirlee Latch  At the [facility], you and your health needs are our priority. As part of our continuing mission to provide you with exceptional heart care, we have created designated Provider Care Teams. These Care Teams include your primary Cardiologist (physician) and Advanced Practice Providers (APPs- Physician Assistants and Nurse Practitioners) who all work together to provide you with the care you need, when you need it.   You may see any of the following providers on your designated Care Team at your next follow up: Dr Arvilla Meres Dr Carron Curie, NP Robbie Lis, [facility], [place] Karle Plumber, PharmD   Please be sure to bring in all your medications bottles to every appointment.   If you have any questions or concerns before your next appointment please send [name] a message through [place] or call our office at [phone].    TO LEAVE A MESSAGE FOR THE NURSE SELECT OPTION 2, PLEASE LEAVE A MESSAGE INCLUDING: YOUR NAME DATE OF BIRTH CALL BACK NUMBER REASON FOR CALL**this is important as we prioritize the call backs  YOU WILL RECEIVE A CALL BACK THE SAME DAY AS LONG AS YOU CALL BEFORE 4:00 PM

## 2021-08-16 ENCOUNTER — Other Ambulatory Visit (HOSPITAL_COMMUNITY): Payer: Self-pay

## 2021-08-20 ENCOUNTER — Other Ambulatory Visit (HOSPITAL_COMMUNITY): Payer: Self-pay | Admitting: Cardiology

## 2021-08-23 ENCOUNTER — Other Ambulatory Visit (HOSPITAL_COMMUNITY): Payer: Self-pay

## 2021-09-13 ENCOUNTER — Other Ambulatory Visit (HOSPITAL_COMMUNITY): Payer: Self-pay | Admitting: Cardiology

## 2021-09-26 ENCOUNTER — Ambulatory Visit (INDEPENDENT_AMBULATORY_CARE_PROVIDER_SITE_OTHER): Payer: Medicare Other | Admitting: Podiatry

## 2021-09-26 ENCOUNTER — Encounter: Payer: Self-pay | Admitting: Podiatry

## 2021-09-26 DIAGNOSIS — R0989 Other specified symptoms and signs involving the circulatory and respiratory systems: Secondary | ICD-10-CM

## 2021-09-26 DIAGNOSIS — B351 Tinea unguium: Secondary | ICD-10-CM

## 2021-09-26 DIAGNOSIS — Z8673 Personal history of transient ischemic attack (TIA), and cerebral infarction without residual deficits: Secondary | ICD-10-CM

## 2021-09-26 DIAGNOSIS — M79675 Pain in left toe(s): Secondary | ICD-10-CM

## 2021-09-26 DIAGNOSIS — E1151 Type 2 diabetes mellitus with diabetic peripheral angiopathy without gangrene: Secondary | ICD-10-CM

## 2021-09-26 DIAGNOSIS — M79674 Pain in right toe(s): Secondary | ICD-10-CM

## 2021-09-26 DIAGNOSIS — M2042 Other hammer toe(s) (acquired), left foot: Secondary | ICD-10-CM

## 2021-09-26 DIAGNOSIS — M2041 Other hammer toe(s) (acquired), right foot: Secondary | ICD-10-CM

## 2021-09-26 NOTE — Progress Notes (Signed)
This patient returns to my office for at risk foot care.  This patient requires this care by a professional since this patient will be at risk due to having type 2 diabetes.  This patient is unable to cut nails himself since the patient cannot reach his nails.These nails are painful walking and wearing shoes. He presents with male caregiver. This patient presents for at risk foot care today.  General Appearance  Alert, conversant and in no acute stress.  Vascular  Dorsalis pedis and posterior tibial  pulses are  weakly palpable  right.  Absent dorsalis pedis and posterior tibial pulses  left.  Capillary return is within normal limits  bilaterally. Temperature is within normal limits  bilaterally.  Neurologic  Senn-Weinstein monofilament wire test within normal limits  bilaterally. Muscle power within normal limits bilaterally.  Nails Thick disfigured discolored nails with subungual debris  from hallux to fifth toes bilaterally. No evidence of bacterial infection or drainage bilaterally.  Orthopedic  No limitations of motion  feet .  No crepitus or effusions noted.  No bony pathology or digital deformities noted.  Skin  normotropic skin with no porokeratosis noted bilaterally.  No signs of infections or ulcers noted.     Onychomycosis  Pain in right toes  Pain in left toes  Consent was obtained for treatment procedures.   Mechanical debridement of nails 1-5  bilaterally performed with a nail nipper.  Filed with dremel without incident.    Return office visit    3 months                  Told patient to return for periodic foot care and evaluation due to potential at risk complications.   Kailyn Dubie DPM    

## 2021-09-28 ENCOUNTER — Other Ambulatory Visit (HOSPITAL_COMMUNITY): Payer: Self-pay

## 2021-10-03 ENCOUNTER — Other Ambulatory Visit (HOSPITAL_COMMUNITY): Payer: Self-pay

## 2021-10-05 ENCOUNTER — Other Ambulatory Visit (HOSPITAL_COMMUNITY): Payer: Self-pay | Admitting: Cardiology

## 2021-10-12 ENCOUNTER — Encounter (HOSPITAL_COMMUNITY): Payer: Self-pay | Admitting: Cardiology

## 2021-10-12 ENCOUNTER — Ambulatory Visit (HOSPITAL_COMMUNITY)
Admission: RE | Admit: 2021-10-12 | Discharge: 2021-10-12 | Disposition: A | Discharge status: Home / Self Care | Payer: Medicare Other | Source: Ambulatory Visit | Attending: Cardiology | Admitting: Cardiology

## 2021-10-12 VITALS — BP 84/50 | HR 53 | Wt 130.0 lb

## 2021-10-12 DIAGNOSIS — R188 Other ascites: Secondary | ICD-10-CM | POA: Insufficient documentation

## 2021-10-12 DIAGNOSIS — Z79899 Other long term (current) drug therapy: Secondary | ICD-10-CM | POA: Insufficient documentation

## 2021-10-12 DIAGNOSIS — I43 Cardiomyopathy in diseases classified elsewhere: Secondary | ICD-10-CM | POA: Insufficient documentation

## 2021-10-12 DIAGNOSIS — I482 Chronic atrial fibrillation, unspecified: Secondary | ICD-10-CM | POA: Insufficient documentation

## 2021-10-12 DIAGNOSIS — I4892 Unspecified atrial flutter: Secondary | ICD-10-CM | POA: Diagnosis not present

## 2021-10-12 DIAGNOSIS — I69320 Aphasia following cerebral infarction: Secondary | ICD-10-CM | POA: Diagnosis not present

## 2021-10-12 DIAGNOSIS — Z7901 Long term (current) use of anticoagulants: Secondary | ICD-10-CM | POA: Diagnosis not present

## 2021-10-12 DIAGNOSIS — I110 Hypertensive heart disease with heart failure: Secondary | ICD-10-CM | POA: Diagnosis not present

## 2021-10-12 DIAGNOSIS — I428 Other cardiomyopathies: Secondary | ICD-10-CM | POA: Diagnosis not present

## 2021-10-12 DIAGNOSIS — I5022 Chronic systolic (congestive) heart failure: Secondary | ICD-10-CM

## 2021-10-12 DIAGNOSIS — Z7984 Long term (current) use of oral hypoglycemic drugs: Secondary | ICD-10-CM | POA: Insufficient documentation

## 2021-10-12 DIAGNOSIS — E854 Organ-limited amyloidosis: Secondary | ICD-10-CM | POA: Diagnosis not present

## 2021-10-12 DIAGNOSIS — I2510 Atherosclerotic heart disease of native coronary artery without angina pectoris: Secondary | ICD-10-CM | POA: Insufficient documentation

## 2021-10-12 LAB — BRAIN NATRIURETIC PEPTIDE: B Natriuretic Peptide: 256.1 pg/mL — ABNORMAL HIGH (ref 0.0–100.0)

## 2021-10-12 LAB — BASIC METABOLIC PANEL
Anion gap: 12 (ref 5–15)
BUN: 25 mg/dL — ABNORMAL HIGH (ref 8–23)
CO2: 24 mmol/L (ref 22–32)
Calcium: 9.7 mg/dL (ref 8.9–10.3)
Chloride: 101 mmol/L (ref 98–111)
Creatinine, Ser: 1.5 mg/dL — ABNORMAL HIGH (ref 0.61–1.24)
GFR, Estimated: 51 mL/min — ABNORMAL LOW (ref >60)
Glucose, Bld: 117 mg/dL — ABNORMAL HIGH (ref 70–99)
Potassium: 4.1 mmol/L (ref 3.5–5.1)
Sodium: 137 mmol/L (ref 135–145)

## 2021-10-12 NOTE — Patient Instructions (Signed)
There has been no changes to your medications..  Labs done today, your results will be available in MyChart, we will contact you for abnormal readings.  Your physician recommends that you schedule a follow-up appointment in: 3 months   If you have any questions or concerns before your next appointment please send us a message through mychart or call our office at [phone].    TO LEAVE A MESSAGE FOR THE NURSE SELECT OPTION 2, PLEASE LEAVE A MESSAGE INCLUDING: YOUR NAME DATE OF BIRTH CALL BACK NUMBER REASON FOR CALL**this is important as we prioritize the call backs  YOU WILL RECEIVE A CALL BACK THE SAME DAY AS LONG AS YOU CALL BEFORE 4:00 PM  At the [facility], you and your health needs are our priority. As part of our continuing mission to provide you with exceptional heart care, we have created designated Provider Care Teams. These Care Teams include your primary Cardiologist (physician) and Advanced Practice Providers (APPs- Physician Assistants and Nurse Practitioners) who all work together to provide you with the care you need, when you need it.   You may see any of the following providers on your designated Care Team at your next follow up: Dr Daniel Bensimhon Dr Dalton McLean Dr. Aditya Sabharwal Amy Clegg, NP Brittainy Simmons, PA Jessica Milford,NP Lindsay Finch, PA Alma Diaz, NP Lauren Kemp, PharmD   Please be sure to bring in all your medications bottles to every appointment.    

## 2021-10-14 NOTE — Progress Notes (Signed)
[facility] Note   Referring Physician: PCP: Mirna Mires, MD PCP-Cardiologist: Verne Carrow, MD  HF Cardiology: Dr. Shirlee Latch   HPI:  66 y.o. with history of chronic atrial fibrillation and end stage nonischemic cardiomyopathy returns for followup of CHF.   Patient had CVA in 2010, since that time has had expressive aphasia and right-sided weakness.  He lives with his brother.  In 8/18, he was admited with new atrial flutter and EF was found to be 20%.  He had cath in 8/18 showing mild nonobstructive coronary disease.  In 10/18, he had DCCV to NSR.  In 11/18 while in NSR, echo showed EF up to 45-50%.  By 2019, he was back in atrial fibrillation.  He was not cardioverted again. Echo in 4/21 showed EF 20-25%.     Patient was admitted in 10/21 with h/o increased dyspnea with exertion and developed abdominal swelling.  Failed outpatient diuretic titration. Admitted for IV diuretics. Echo repeated and EF was 20% with mod-severe RV dilation and moderate RV dysfunction, D-shaped septum, dilated IVC, severe biatrial enlargement.  He was in atrial fibrillation in 50s-60s. CT of abdomen showed large volume ascites, had paracentesis with 4.8 L out.  Cardiac MRI showed LV EF 24%, RV mildly dilated with EF 42%, extensive LGE in a non-coronary distribution throughout much of the LV, also involving RV free wall and left and right atrial walls, ECV 42%. This was suggestive of infiltrative disease. 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  He was initially on milrinone for low output HF, this was weaned off for DCCV which he failed. RHC 11/11/19 off milrinone showed optimized filling pressures but low cardiac output, milrinone restarted at 0.125. Discharged w/ home milrinone + home health.  He remained in atrial fibrillation.   He was not thought to be a candidate for LVAD.   Echo in 6/22 showd EF 25-30%, mild LV dilation, mild LVH, moderately decreased RV systolic function, IVC dilated, PASP 49 mmHg.  Echo in 6/23 showed EF 25-30%, global hypokinesis, mild LV dilation, moderately decreased RV systolic function, severe biatrial enlargement, mild MR.   He returns for followup of end-stage CHF.  Weight is up 5 lbs, he has been trying to eat better.  History comes mainly from family because of aphasia.  He remains on milrinone 0.125.  He is doing well overall on milrinone. He walks with a cane and is able to do all his ADLs.  Overall seems stable.  No dyspnea walking slowly on flat ground.  No chest pain.  No orthopnea/PND.  No lightheadedness or falls.     ECG (personally reviewed): Atrial fibrillation, nonspecific T wave flattening  Labs (1/22): K 3.8, creatinine 1.2 Labs (4/22): K 4.2, creatinine 1.27 Labs (7/22): K 4.6, creatinine 1.47 Labs (5/23): K 3.8, creatinine 1.2, hgb 10.3 Labs (9/23): creatinine 1.39, hgb 12.4  Review of systems complete and found to be negative unless listed in HPI.    PMH: 1. 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  2. DM2 3. Atrial fibrillation: Chronic. Has failed DCCV and amiodarone.  4. Chronic systolic CHF: End-stage nonischemic cardiomyopathy.   - LHC (8/18): Mild nonobstructive CAD.  - RHC (11/21): mean RA 7, PA 29/9, mean PCWP 8, CI 1.6 (thermodilution)/2.5 (Fick) - Echo (11/21): EF 15-20%, D-shaped interventricular septum, severe dilated and severely dysfunctional RV, severe  biatrial enlargement.  - Cardiac MRI (11/21): LV EF 24%, RV mildly dilated with EF 42%, extensive LGE in a non-coronary distribution throughout much of the LV, also involving RV free wall and left and right atrial walls, ECV 42%. This was suggestive of infiltrative disease. 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  - milrinone dependent - Echo (6/22): EF 25-30%, mild LV dilation, mild LVH, moderately decreased RV systolic function, IVC dilated, PASP 49 mmHg. - Echo (6/23): EF 25-30%, mild LV dilation, mild LVH, moderately decreased RV systolic function, IVC dilated, PASP 49 mmHg.  Echo in 6/23 showed EF 25-30%, global hypokinesis, mild LV dilation, moderately decreased RV systolic function, severe biatrial enlargement, mild MR.    Current Outpatient Medications  Medication Sig Dispense Refill   Accu-Chek FastClix Lancets MISC 1 each by Other route as directed.     ACCU-CHEK GUIDE test strip 1 each by Other route See admin instructions.     acetaminophen (TYLENOL) 500 MG tablet Take 1,000 mg by mouth every 6 (six) hours as needed for pain.     apixaban (ELIQUIS) 5 MG TABS tablet TAKE 1 TABLET(5 MG) BY MOUTH TWICE DAILY 180 tablet 2   dapagliflozin propanediol (FARXIGA) 10 MG TABS tablet Take 1 tablet (10 mg total) by mouth daily before breakfast. 90 tablet 3   milrinone (PRIMACOR) 20 MG/100 ML SOLN infusion Inject 0.0139 mg/min into the vein continuous. 100 mL 0   Multiple Vitamin (MULTIVITAMIN WITH MINERALS) TABS Take 1 tablet by mouth daily.     omega-3 acid ethyl esters (LOVAZA) 1 G capsule Take 1 g by mouth daily.     potassium chloride SA (KLOR-CON M) 20 MEQ tablet Take 1 tablet (20 mEq total) by mouth in the morning AND 0.5 tablets (10 mEq total) every evening. 135 tablet 3   simvastatin (ZOCOR) 40 MG tablet Take 40 mg by mouth daily.     spironolactone (ALDACTONE) 25 MG tablet Take 1 tablet (25 mg total) by mouth daily. 90 tablet 3   Tafamidis (VYNDAMAX) 61 MG CAPS Take 1 capsule by mouth daily. 90 capsule 3   torsemide (DEMADEX) 20 MG tablet Take 2 tablets (40 mg total) by mouth daily. 270 tablet 3   TRADJENTA 5 MG TABS tablet Take 5 mg by mouth every morning.     No current facility-administered medications for this encounter.    No Known Allergies    Social History    Socioeconomic History   Marital status: Single    Spouse name: Not on file   Number of children: Not on file   Years of education: Not on file   Highest education level: Not on file  Occupational History   Not on file  Tobacco Use   Smoking status: Never   Smokeless tobacco: Never  Vaping Use   Vaping Use: Never used  Substance and Sexual Activity   Alcohol use: No   Drug use: No   Sexual activity: Not Currently  Other Topics Concern   Not on file  Social History Narrative   Not on file   Social Determinants of Health   Financial Resource Strain: Not on file  Food Insecurity: Not on file  Transportation Needs: Not on file  Physical Activity: Not on file  Stress: Not on file  Social Connections: Not on file  Intimate Partner Violence: Not on file      Family History  Problem Relation  Age of Onset   Hypertension Mother    Diabetes Mother    Heart disease Father     Vitals:   10/12/21 1143  BP: (!) 84/50  Pulse: (!) 53  SpO2: 99%  Weight: 59 kg (130 lb)   PHYSICAL EXAM: General: NAD Neck: No JVD, no thyromegaly or thyroid nodule.  Lungs: Clear to auscultation bilaterally with normal respiratory effort. CV: Nondisplaced PMI.  Heart irregular S1/S2, no S3/S4, no murmur.  No peripheral edema.  No carotid bruit.  Normal pedal pulses.  Abdomen: Soft, nontender, no hepatosplenomegaly, no distention.  Skin: Intact without lesions or rashes.  Neurologic: Alert and oriented x 3. Expressive aphasia, right arm weak.  Psych: Normal affect. Extremities: No clubbing or cyanosis.  HEENT: Normal.    ASSESSMENT & PLAN:  1. Chronic systolic CHF: Nonischemic cardiomyopathy known since 2018.  Improved initially in 2018 with DCCV, but has been back in atrial fibrillation since 2019 it appears and EF has been low. Admission 10/21 for a/c HF w/ low output requiring milrinone. Echo showed EF 15-20% with mod-severe RV dilation and moderate RV dysfunction, D-shaped septum,  dilated IVC, severe biatrial enlargement. Significant RV failure with ascites on CT abdomen, had paracentesis.  Cardiac MRI showed LV EF 24%, RV mildly dilated with EF 42%, extensive LGE in a non-coronary distribution throughout much of the LV, also involving RV free wall and left and right atrial walls, ECV 42%. This was suggestive of infiltrative disease. 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  He was initially on milrinone for low output HF, this was weaned off for DCCV which he failed. RHC 11/11/19 off milrinone showed optimized filling pressures but low cardiac output, milrinone restarted and has been continued since that time. He was sent home in 11/21 on hospice but hospice has released him. Echo in 6/23 showed EF 25-30%, moderate RV dysfunction.  He is not volume overloaded on exam, NYHA class II-III symptoms.  BP soft but denies lightheadedness.  - Continue home milrinone 0.125, he has done quite well with this.  No PICC line complications.    - Continue torsemide 40 mg daily, BMET today.  - Continue Farxiga 10 mg daily.      - Continue Spiro 25 mg daily  - BP too low for ARB/ARNi - No digoxin with history of bradycardia.   - Narrow QRS, not CRT candidate.  - Not a transplant candidate and not a good candidate for LVAD. Patient assessed by our LVAD nurse, she had significant concerns about his right arm/hand weakness.  He would be unable to manage his LVAD equipment and would need consistent 24 hr caregiver (not available).       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  He does not have symptoms of peripheral neuropathy.  2. Atrial fibrillation: Now chronic.  Initially in 2018, EF improved after DCCV.  However, as above, he has been  relatively well rate controlled and has not been in RVR, so do not think that cardiomyopathy is primarily due to AF.  He failed DCCV on amiodarone 10/21, amiodarone stopped due to bradycardia.  - Continue Eliquis. Recent CBC stable.   3. Ascites: Suspect due to RV failure. S/p Paracentesis on 10/31 with 4.8 L out. No evidence of recurrence on  exam today  4. H/o CVA: In 2010, with chronic expressive aphasia and right-sided weakness. Stable.   Followup APP in 3 months.   Marca Ancona, MD 10/14/21

## 2021-10-24 ENCOUNTER — Other Ambulatory Visit (HOSPITAL_COMMUNITY): Payer: Self-pay | Admitting: Cardiology

## 2021-11-08 ENCOUNTER — Other Ambulatory Visit (HOSPITAL_COMMUNITY): Payer: Self-pay

## 2021-11-19 ENCOUNTER — Other Ambulatory Visit (HOSPITAL_COMMUNITY): Payer: Self-pay

## 2021-11-19 ENCOUNTER — Telehealth (HOSPITAL_COMMUNITY): Payer: Self-pay

## 2021-11-19 NOTE — Telephone Encounter (Signed)
Orders for Milrinone faxed on 11/19/21 to [facility]

## 2021-11-22 ENCOUNTER — Other Ambulatory Visit (HOSPITAL_COMMUNITY): Payer: Self-pay | Admitting: Cardiology

## 2021-12-18 ENCOUNTER — Other Ambulatory Visit: Payer: Self-pay | Admitting: Cardiology

## 2021-12-18 DIAGNOSIS — I4821 Permanent atrial fibrillation: Secondary | ICD-10-CM

## 2022-01-02 ENCOUNTER — Encounter: Payer: Self-pay | Admitting: Podiatry

## 2022-01-02 ENCOUNTER — Ambulatory Visit (INDEPENDENT_AMBULATORY_CARE_PROVIDER_SITE_OTHER): Payer: Medicare Other | Admitting: Podiatry

## 2022-01-02 ENCOUNTER — Ambulatory Visit: Payer: Medicare Other | Admitting: Podiatry

## 2022-01-02 DIAGNOSIS — Z8673 Personal history of transient ischemic attack (TIA), and cerebral infarction without residual deficits: Secondary | ICD-10-CM

## 2022-01-02 DIAGNOSIS — M79674 Pain in right toe(s): Secondary | ICD-10-CM

## 2022-01-02 DIAGNOSIS — B351 Tinea unguium: Secondary | ICD-10-CM

## 2022-01-02 DIAGNOSIS — M2042 Other hammer toe(s) (acquired), left foot: Secondary | ICD-10-CM

## 2022-01-02 DIAGNOSIS — M2041 Other hammer toe(s) (acquired), right foot: Secondary | ICD-10-CM

## 2022-01-02 DIAGNOSIS — E1151 Type 2 diabetes mellitus with diabetic peripheral angiopathy without gangrene: Secondary | ICD-10-CM | POA: Diagnosis not present

## 2022-01-02 DIAGNOSIS — R0989 Other specified symptoms and signs involving the circulatory and respiratory systems: Secondary | ICD-10-CM

## 2022-01-02 DIAGNOSIS — M79675 Pain in left toe(s): Secondary | ICD-10-CM | POA: Diagnosis not present

## 2022-01-02 NOTE — Progress Notes (Signed)
This patient returns to my office for at risk foot care.  This patient requires this care by a professional since this patient will be at risk due to having type 2 diabetes.  This patient is unable to cut nails himself since the patient cannot reach his nails.These nails are painful walking and wearing shoes. He presents with male caregiver. This patient presents for at risk foot care today.  General Appearance  Alert, conversant and in no acute stress.  Vascular  Dorsalis pedis and posterior tibial  pulses are  weakly palpable  right.  Absent dorsalis pedis and posterior tibial pulses  left.  Capillary return is within normal limits  bilaterally. Temperature is within normal limits  bilaterally.  Neurologic  Senn-Weinstein monofilament wire test within normal limits  bilaterally. Muscle power within normal limits bilaterally.  Nails Thick disfigured discolored nails with subungual debris  from hallux to fifth toes bilaterally. No evidence of bacterial infection or drainage bilaterally.  Orthopedic  No limitations of motion  feet .  No crepitus or effusions noted.  No bony pathology or digital deformities noted.  Skin  normotropic skin with no porokeratosis noted bilaterally.  No signs of infections or ulcers noted.     Onychomycosis  Pain in right toes  Pain in left toes  Consent was obtained for treatment procedures.   Mechanical debridement of nails 1-5  bilaterally performed with a nail nipper.  Filed with dremel without incident.    Return office visit    3 months                  Told patient to return for periodic foot care and evaluation due to potential at risk complications.   Katherina Wimer DPM    

## 2022-01-10 ENCOUNTER — Ambulatory Visit (HOSPITAL_COMMUNITY)
Admission: RE | Admit: 2022-01-10 | Discharge: 2022-01-10 | Disposition: A | Discharge status: Home / Self Care | Payer: Medicare Other | Source: Ambulatory Visit | Attending: Family Medicine | Admitting: Family Medicine

## 2022-01-10 ENCOUNTER — Encounter (HOSPITAL_COMMUNITY): Payer: Self-pay

## 2022-01-10 VITALS — BP 110/82 | HR 72 | Wt 136.0 lb

## 2022-01-10 DIAGNOSIS — R188 Other ascites: Secondary | ICD-10-CM | POA: Insufficient documentation

## 2022-01-10 DIAGNOSIS — I428 Other cardiomyopathies: Secondary | ICD-10-CM | POA: Insufficient documentation

## 2022-01-10 DIAGNOSIS — R531 Weakness: Secondary | ICD-10-CM | POA: Diagnosis not present

## 2022-01-10 DIAGNOSIS — I69320 Aphasia following cerebral infarction: Secondary | ICD-10-CM | POA: Diagnosis not present

## 2022-01-10 DIAGNOSIS — I4892 Unspecified atrial flutter: Secondary | ICD-10-CM | POA: Insufficient documentation

## 2022-01-10 DIAGNOSIS — I482 Chronic atrial fibrillation, unspecified: Secondary | ICD-10-CM | POA: Diagnosis not present

## 2022-01-10 DIAGNOSIS — Z87898 Personal history of other specified conditions: Secondary | ICD-10-CM | POA: Diagnosis not present

## 2022-01-10 DIAGNOSIS — Z7984 Long term (current) use of oral hypoglycemic drugs: Secondary | ICD-10-CM | POA: Insufficient documentation

## 2022-01-10 DIAGNOSIS — I43 Cardiomyopathy in diseases classified elsewhere: Secondary | ICD-10-CM | POA: Insufficient documentation

## 2022-01-10 DIAGNOSIS — Z7901 Long term (current) use of anticoagulants: Secondary | ICD-10-CM | POA: Diagnosis not present

## 2022-01-10 DIAGNOSIS — Z79899 Other long term (current) drug therapy: Secondary | ICD-10-CM | POA: Diagnosis not present

## 2022-01-10 DIAGNOSIS — I5022 Chronic systolic (congestive) heart failure: Secondary | ICD-10-CM | POA: Diagnosis present

## 2022-01-10 LAB — CBC
HCT: 43.3 % (ref 39.0–52.0)
Hemoglobin: 14 g/dL (ref 13.0–17.0)
MCH: 28.7 pg (ref 26.0–34.0)
MCHC: 32.3 g/dL (ref 30.0–36.0)
MCV: 88.9 fL (ref 80.0–100.0)
Platelets: 182 10*3/uL (ref 150–400)
RBC: 4.87 MIL/uL (ref 4.22–5.81)
RDW: 15 % (ref 11.5–15.5)
WBC: 6.2 10*3/uL (ref 4.0–10.5)
nRBC: 0 % (ref 0.0–0.2)

## 2022-01-10 LAB — BASIC METABOLIC PANEL
Anion gap: 16 — ABNORMAL HIGH (ref 5–15)
BUN: 35 mg/dL — ABNORMAL HIGH (ref 8–23)
CO2: 20 mmol/L — ABNORMAL LOW (ref 22–32)
Calcium: 9.4 mg/dL (ref 8.9–10.3)
Chloride: 98 mmol/L (ref 98–111)
Creatinine, Ser: 1.61 mg/dL — ABNORMAL HIGH (ref 0.61–1.24)
GFR, Estimated: 47 mL/min — ABNORMAL LOW (ref >60)
Glucose, Bld: 101 mg/dL — ABNORMAL HIGH (ref 70–99)
Potassium: 3.7 mmol/L (ref 3.5–5.1)
Sodium: 134 mmol/L — ABNORMAL LOW (ref 135–145)

## 2022-01-10 LAB — BRAIN NATRIURETIC PEPTIDE: B Natriuretic Peptide: 317.7 pg/mL — ABNORMAL HIGH (ref 0.0–100.0)

## 2022-01-10 LAB — MAGNESIUM: Magnesium: 2.4 mg/dL (ref 1.7–2.4)

## 2022-01-10 NOTE — Patient Instructions (Signed)
Routine lab work today. Will notify you of abnormal results  Follow up in 3-4 months with Dr.McLean   Do the following things EVERYDAY: Weigh yourself in the morning before breakfast. Write it down and keep it in a log. Take your medicines as prescribed Eat low salt foods--Limit salt (sodium) to 2000 mg per day.  Stay as active as you can everyday Limit all fluids for the day to less than 2 liters

## 2022-01-10 NOTE — Progress Notes (Signed)
Advanced Heart Failure Clinic Note   PCP: Iona Beard, MD Primary Cardiologist: Lauree Chandler, MD  HF Cardiology: Dr. Aundra Dubin   HPI:  67 y.o. with history of chronic atrial fibrillation and end stage nonischemic cardiomyopathy returns for followup of CHF.   Patient had CVA in 2010, since that time has had expressive aphasia and right-sided weakness.  He lives with his brother.  In 8/18, he was admited with new atrial flutter and EF was found to be 20%.  He had cath in 8/18 showing mild nonobstructive coronary disease.  In 10/18, he had DCCV to NSR.  In 11/18 while in NSR, echo showed EF up to 45-50%. By 2019, he was back in atrial fibrillation.  He was not cardioverted again. Echo in 4/21 showed EF 20-25%.     Patient was admitted in 10/21 with h/o increased dyspnea with exertion and developed abdominal swelling.  Failed outpatient diuretic titration. Admitted for IV diuretics. Echo repeated and EF was 20% with mod-severe RV dilation and moderate RV dysfunction, D-shaped septum, dilated IVC, severe biatrial enlargement.  He was in atrial fibrillation in 50s-60s. CT of abdomen showed large volume ascites, had paracentesis with 4.8 L out.  Cardiac MRI showed LV EF 24%, RV mildly dilated with EF 42%, extensive LGE in a non-coronary distribution throughout much of the LV, also involving RV free wall and left and right atrial walls, ECV 42%. This was suggestive of infiltrative disease. 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  He was initially on milrinone for low output HF, this was weaned off for DCCV which he failed. [place] 11/11/19 off milrinone showed optimized filling pressures but low cardiac output, milrinone restarted at 0.125. Discharged w/ home milrinone + home health.  He remained in atrial fibrillation.  He was not thought  to be a candidate for LVAD.   Echo in 6/22 showd EF 25-30%, mild LV dilation, mild LVH, moderately decreased RV systolic function, IVC dilated, PASP 49 mmHg.  Echo in 6/23 showed EF 25-30%, global hypokinesis, mild LV dilation, moderately decreased RV systolic function, severe biatrial enlargement, mild MR.   Follow up 10/23, NYHA II-III and volume stable.   Today he returns for HF follow up with his brother. Overall feeling fine. 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 He walks with a cane but does not have dyspnea walking on flat ground, independent with ADLs. Denies palpitations, abnormal bleed, CP, dizziness, edema, or PND/Orthopnea. Appetite ok. No fever or chills. Weight at home 135 pounds. Taking all medications, no issues with milrinone pump.    ECG (personally reviewed): Atrial flutter, HR 60's  Labs (1/22): K 3.8, creatinine 1.2 Labs (4/22): K 4.2, creatinine 1.27 Labs (7/22): K 4.6, creatinine 1.47 Labs (5/23): K 3.8, creatinine 1.2, hgb 10.3 Labs (9/23): creatinine 1.39, hgb 12.4 Labs (10/23): K 4.2, creatinine 1.48  Review of systems complete and found to be negative unless listed in HPI.    PMH: 1. 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  2. DM2 3. Atrial fibrillation: Chronic. Has failed DCCV and amiodarone.  4. Chronic systolic CHF: End-stage nonischemic cardiomyopathy.   - LHC (8/18): Mild nonobstructive CAD.  - RHC (11/21): mean RA 7, PA 29/9, mean PCWP 8, CI 1.6 (thermodilution)/2.5 (Fick) - Echo (11/21): EF 15-20%, D-shaped interventricular septum, severe dilated and severely dysfunctional RV, severe biatrial  enlargement.  - Cardiac MRI (11/21): LV EF 24%, RV mildly dilated with EF 42%, extensive LGE in a non-coronary distribution throughout much of the LV, also involving RV free wall and left and right atrial walls, ECV 42%. This was suggestive of infiltrative disease. 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  - milrinone dependent - Echo (6/22): EF 25-30%, mild LV dilation, mild LVH, moderately decreased RV systolic function, IVC dilated, PASP 49 mmHg. - Echo (6/23): EF 25-30%, mild LV dilation, mild LVH, moderately decreased RV systolic function, IVC dilated, PASP 49 mmHg.  Echo in 6/23 showed EF 25-30%, global hypokinesis, mild LV dilation, moderately decreased RV systolic function, severe biatrial enlargement, mild MR.   Current Outpatient Medications  Medication Sig Dispense Refill   Accu-Chek FastClix Lancets MISC 1 each by Other route as directed.     ACCU-CHEK GUIDE test strip 1 each by Other route See admin instructions.     acetaminophen (TYLENOL) 500 MG tablet Take 1,000 mg by mouth every 6 (six) hours as needed for pain.     dapagliflozin propanediol (FARXIGA) 10 MG TABS tablet Take 1 tablet (10 mg total) by mouth daily before breakfast. 90 tablet 3   ELIQUIS 5 MG TABS tablet TAKE 1 TABLET(5 MG) BY MOUTH TWICE DAILY 180 tablet 2   milrinone (PRIMACOR) 20 MG/100 ML SOLN infusion Inject 0.0139 mg/min into the vein continuous. 100 mL 0   Multiple Vitamin (MULTIVITAMIN WITH MINERALS) TABS Take 1 tablet by mouth daily.     omega-3 acid ethyl esters (LOVAZA) 1 G capsule Take 1 g by mouth daily.     simvastatin (ZOCOR) 40 MG tablet Take 40 mg by mouth daily.     spironolactone (ALDACTONE) 25 MG tablet Take 1 tablet (25 mg total) by mouth daily. 90 tablet 3   Tafamidis (VYNDAMAX) 61 MG CAPS Take 1 capsule by mouth daily. 90 capsule 3   torsemide (DEMADEX) 20 MG tablet TAKE 2 TABLETS(40 MG) BY MOUTH TWICE DAILY 270 tablet 3   TRADJENTA 5 MG TABS tablet Take 5 mg by mouth every morning.     potassium chloride SA (KLOR-CON M) 20 MEQ tablet Take 1 tablet (20 mEq total) by mouth in the morning AND 0.5 tablets (10 mEq total) every evening. (Patient not taking: Reported on 01/10/2022) 135 tablet 3   No current facility-administered  medications for this encounter.   No Known Allergies  Social History   Socioeconomic History   Marital status: Single    Spouse name: Not on file   Number of children: Not on file   Years of education: Not on file   Highest education level: Not on file  Occupational History   Not on file  Tobacco Use   Smoking status: Never   Smokeless tobacco: Never  Vaping Use   Vaping Use: Never used  Substance and Sexual Activity   Alcohol use: No   Drug use: No   Sexual activity: Not Currently  Other Topics Concern   Not on file  Social History Narrative   Not on file   Social Determinants of Health   Financial Resource Strain: Not on file  Food Insecurity: Not on file  Transportation Needs: Not on file  Physical Activity: Not on file  Stress: Not on file  Social Connections: Not on file  Intimate Partner Violence: Not on file   Family History  Problem Relation Age of Onset  Hypertension Mother    Diabetes Mother    Heart disease Father    BP 110/82   Pulse 72   Wt 61.7 kg (136 lb)   SpO2 99%   BMI 21.30 kg/m   Wt Readings from Last 3 Encounters:  01/10/22 61.7 kg (136 lb)  10/12/21 59 kg (130 lb)  08/13/21 56.8 kg (125 lb 3.2 oz)   PHYSICAL EXAM: General:  NAD. No resp difficulty, walked into clinic with cane. HEENT: Normal Neck: Supple. No JVD. Carotids 2+ bilat; no bruits. No lymphadenopathy or thryomegaly appreciated. Cor: PMI nondisplaced. Irregular rate & rhythm. No rubs, gallops or murmurs. Lungs: Clear Abdomen: Soft, nontender, nondistended. No hepatosplenomegaly. No bruits or masses. Good bowel sounds. Extremities: No cyanosis, clubbing, rash, edema; LUE PICC site looks OK Neuro: Alert & oriented x 3, cranial nerves grossly intact. R hemiparesis. Affect pleasant, + aphasia  ASSESSMENT & PLAN: 1. Chronic systolic CHF: Nonischemic cardiomyopathy known since 2018.  Improved initially in 2018 with DCCV, but has been back in atrial fibrillation since 2019  it appears and EF has been low. Admission 10/21 for a/c HF w/ low output requiring milrinone. Echo showed EF 15-20% with mod-severe RV dilation and moderate RV dysfunction, D-shaped septum, dilated IVC, severe biatrial enlargement. Significant RV failure with ascites on CT abdomen, had paracentesis. Cardiac MRI showed LV EF 24%, RV mildly dilated with EF 42%, extensive LGE in a non-coronary distribution throughout much of the LV, also involving RV free wall and left and right atrial walls, ECV 42%. This was suggestive of infiltrative disease. 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  He was initially on milrinone for low output HF, this was weaned off for DCCV which he failed. [place] 11/11/19 off milrinone showed optimized filling pressures but low cardiac output, milrinone restarted and has been continued since that time. He was sent home in 11/21 on hospice but hospice has released him. Echo in 6/23 showed EF 25-30%, moderate RV dysfunction.  He is not volume overloaded on exam, NYHA class II-III symptoms.  - Continue home milrinone 0.125, he has done quite well with this.  No PICC line complications.    - Continue torsemide 40 mg daily, BMET/BNP today.  - Continue Farxiga 10 mg daily. No GU symptoms.  - Continue spiro 25 mg daily.  - BP too low for ARB/ARNi. - No digoxin with history of bradycardia.   - Narrow QRS, not CRT candidate.  - Not a transplant candidate and not a good candidate for LVAD. Patient assessed by our LVAD nurse, she had significant concerns about his right arm/hand weakness.  He would be unable to manage his LVAD equipment and would need consistent 24 hr caregiver (not available).       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  He  does not have symptoms of peripheral neuropathy.  2. Atrial fibrillation: Now chronic.  Initially in 2018, EF improved after DCCV.  However, as above, he has been relatively well rate controlled and has not been in RVR, so do not think that cardiomyopathy is primarily due to AF.  He failed DCCV on amiodarone 10/21, amiodarone stopped due to bradycardia.  - Continue Eliquis. No bleeding issues. CBC today.   3. Ascites: Suspect due to RV failure. S/p  Paracentesis on 10/31 with 4.8 L out. No evidence of recurrence on exam today  4. H/o CVA: In 2010, with chronic expressive aphasia and right-sided weakness. Stable.   Follow up in 3 months with Dr. Aundra Dubin.  [place], FNP 01/10/22

## 2022-01-29 ENCOUNTER — Other Ambulatory Visit (HOSPITAL_COMMUNITY): Payer: Self-pay | Admitting: Cardiology

## 2022-02-05 ENCOUNTER — Other Ambulatory Visit (HOSPITAL_COMMUNITY): Payer: Self-pay

## 2022-02-05 ENCOUNTER — Other Ambulatory Visit (HOSPITAL_COMMUNITY): Payer: Self-pay | Admitting: Cardiology

## 2022-02-05 MED ORDER — VYNDAMAX 61 MG PO CAPS
1.0000 | ORAL_CAPSULE | Freq: Every day | ORAL | 3 refills | Status: DC
  Filled 2022-02-05: qty 30, 30d supply, fill #0
  Filled 2022-04-11: qty 30, 30d supply, fill #1
  Filled 2022-04-12 (×3): qty 90, 90d supply, fill #1
  Filled 2022-04-12: qty 30, 30d supply, fill #1
  Filled 2022-07-03: qty 90, 90d supply, fill #2
  Filled 2022-09-19: qty 90, 90d supply, fill #3

## 2022-02-08 ENCOUNTER — Telehealth (HOSPITAL_COMMUNITY): Payer: Self-pay | Admitting: Cardiology

## 2022-02-08 NOTE — Telephone Encounter (Signed)
HH verbal orders given to continue services through [facility] -milrinone infusion management

## 2022-02-11 ENCOUNTER — Other Ambulatory Visit (HOSPITAL_COMMUNITY): Payer: Self-pay | Admitting: Cardiology

## 2022-02-27 ENCOUNTER — Other Ambulatory Visit (HOSPITAL_COMMUNITY): Payer: Self-pay | Admitting: Cardiology

## 2022-02-28 ENCOUNTER — Telehealth (HOSPITAL_COMMUNITY): Payer: Self-pay | Admitting: Pharmacy Technician

## 2022-02-28 ENCOUNTER — Other Ambulatory Visit (HOSPITAL_COMMUNITY): Payer: Self-pay

## 2022-02-28 NOTE — Telephone Encounter (Signed)
Patient Advocate Encounter   Received notification from [facility] that prior authorization for Vyndamax is required.   PA submitted on CoverMyMeds Key [id number] Status is pending   Will continue to follow.

## 2022-03-01 IMAGING — CT CT ABD-PELV W/ CM
2 of 5 series · 15 of 46 positions shown, 17 images · IV contrast (APPLIED)
Comparison: None.

CLINICAL DATA: Shortness of breath and abdominal distension

EXAM:
CT ANGIOGRAPHY CHEST
CT ABDOMEN AND PELVIS WITH CONTRAST
TECHNIQUE: Multidetector CT imaging of the chest was performed using the
standard protocol during bolus administration of intravenous
contrast. Multiplanar CT image reconstructions and MIPs were
obtained to evaluate the vascular anatomy. Multidetector CT imaging
of the abdomen and pelvis was performed using the standard protocol
during bolus administration of intravenous contrast.
CONTRAST:  100mL OMNIPAQUE IOHEXOL 350 MG/ML SOLN

[Series 5: routine abd/pel with · axial · 0.77mm/px · z∈[-656,-251]mm · 12 of 93 slices shown, 14 images]
[im 6/93  soft-tissue]
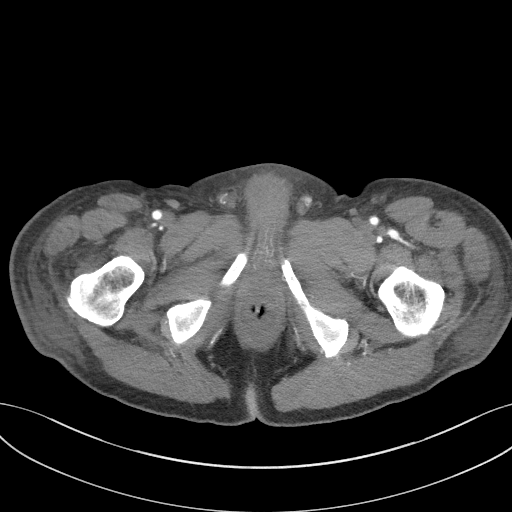
[im 6/93  bone]
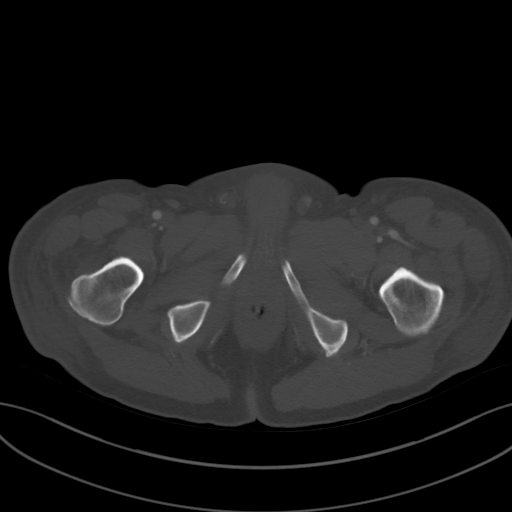
[im 16/93  soft-tissue]
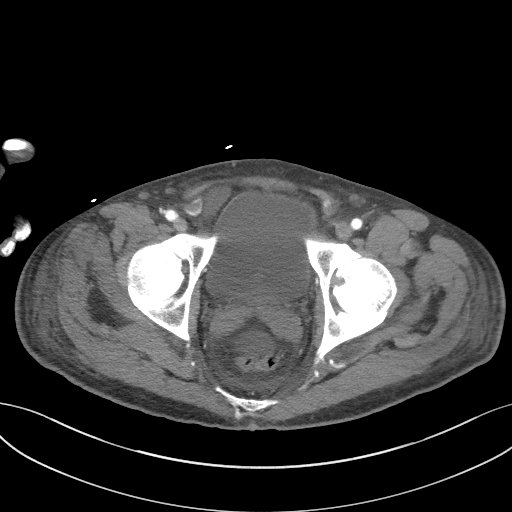
[im 21/93  soft-tissue]
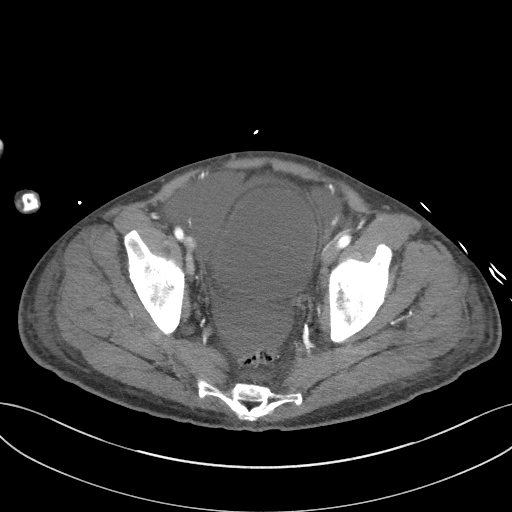
[im 26/93  soft-tissue]
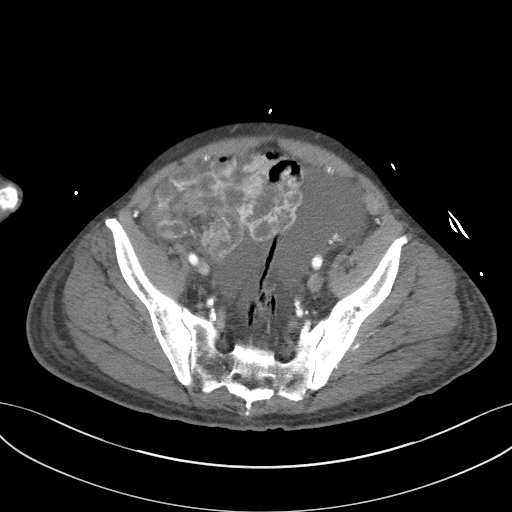
[im 36/93  soft-tissue]
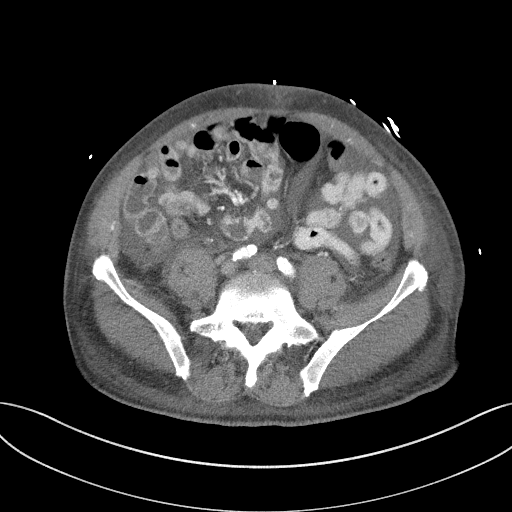
[im 41/93  soft-tissue]
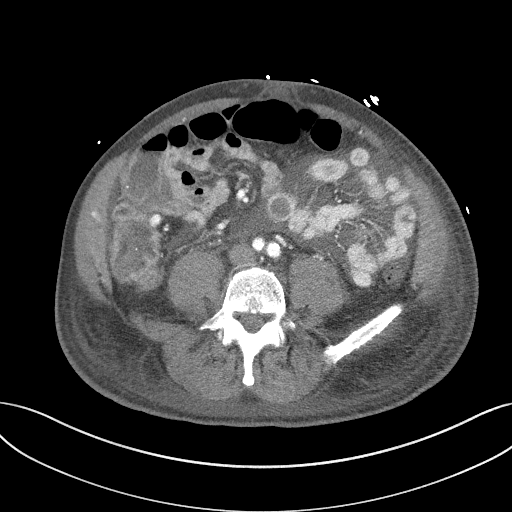
[im 52/93  soft-tissue]
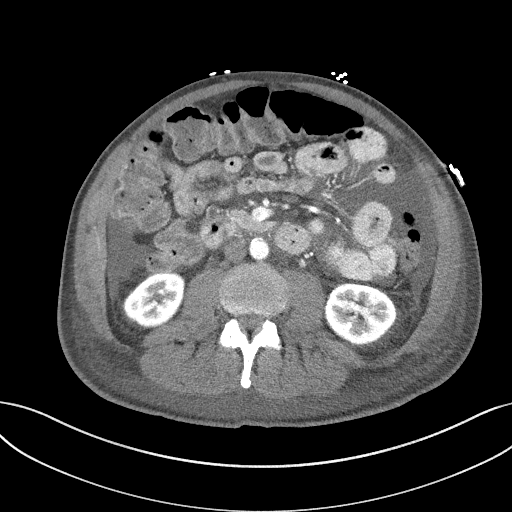
[im 57/93  soft-tissue]
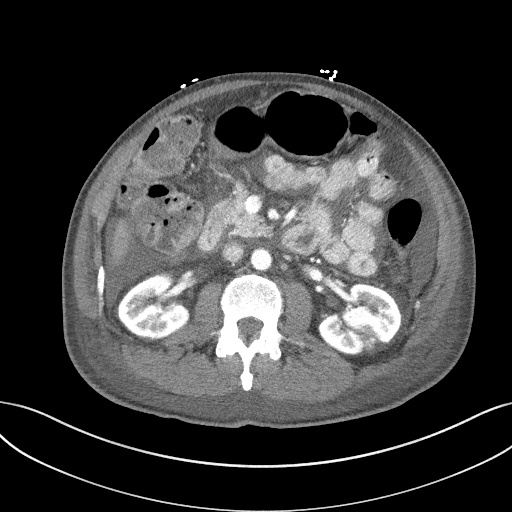
[im 67/93  soft-tissue]
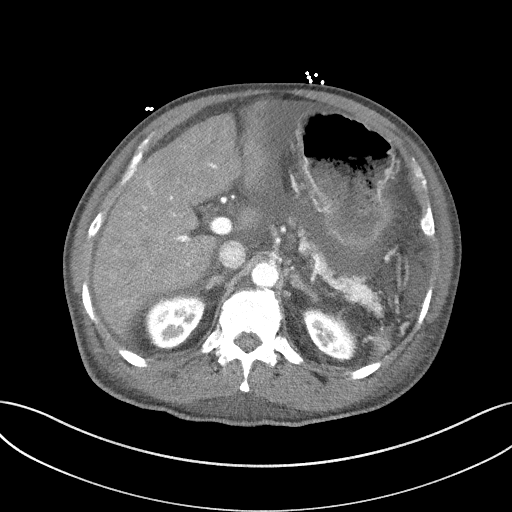
[im 67/93  bone]
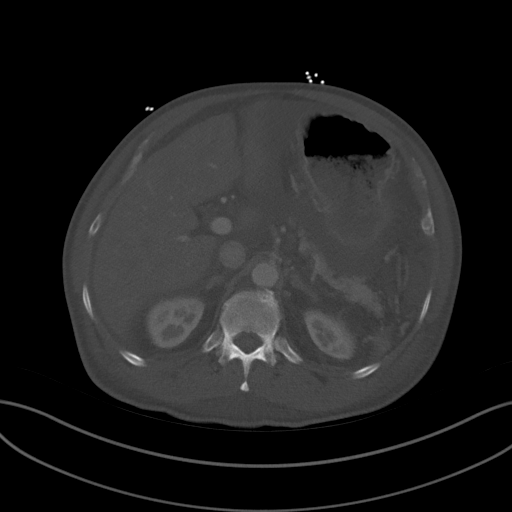
[im 72/93  soft-tissue]
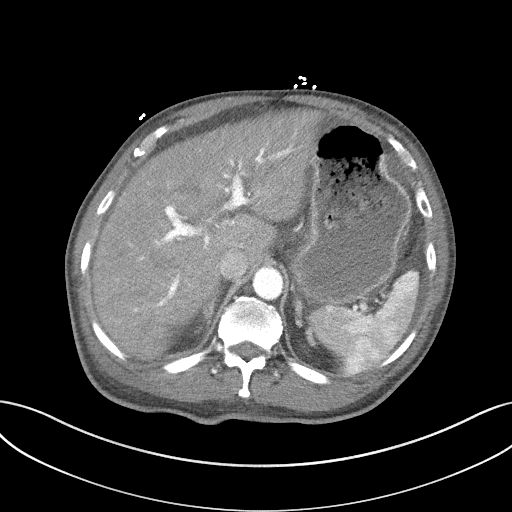
[im 77/93  soft-tissue]
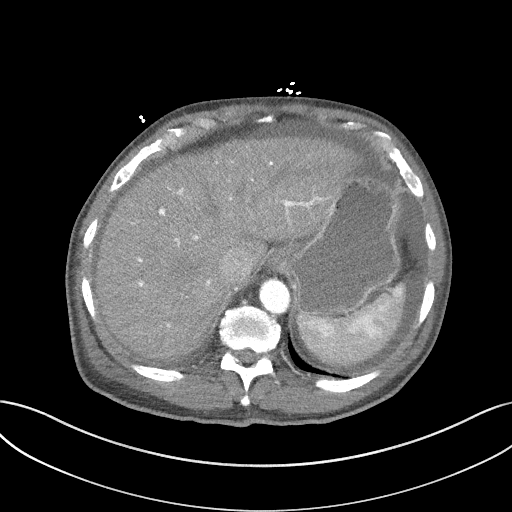
[im 87/93  soft-tissue]
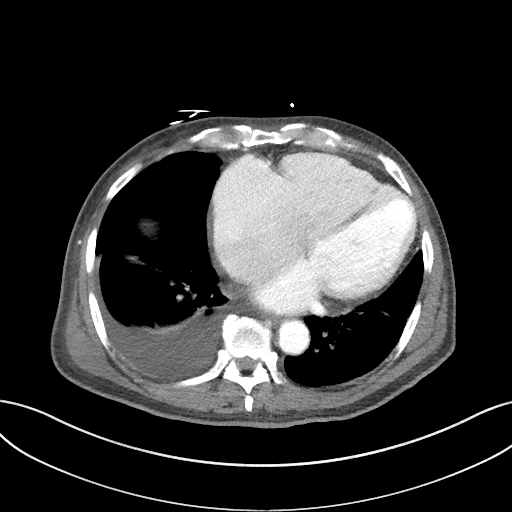

[Series 8: coronal st · coronal · 0.73mm/px · 3 of 91 slices shown]
[im 31/91  soft-tissue]
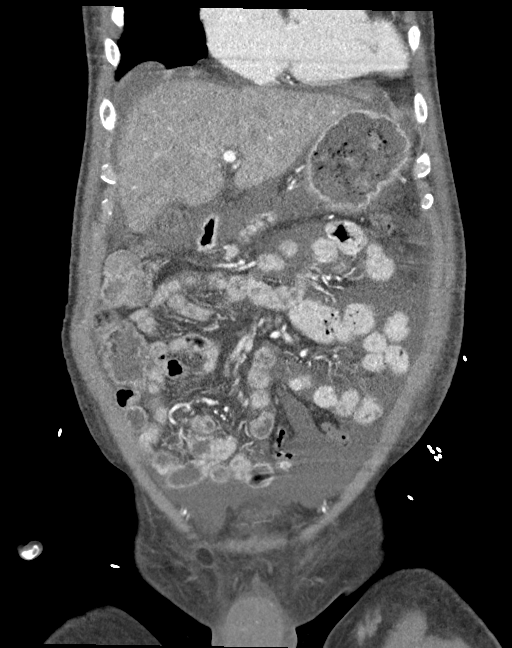
[im 41/91  soft-tissue]
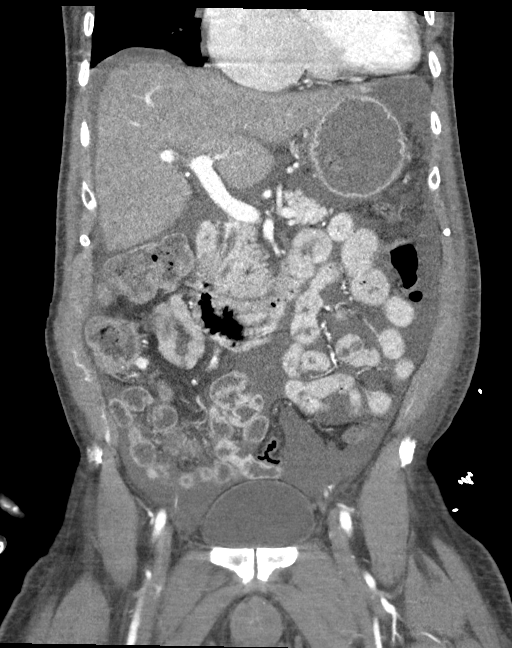
[im 51/91  soft-tissue]
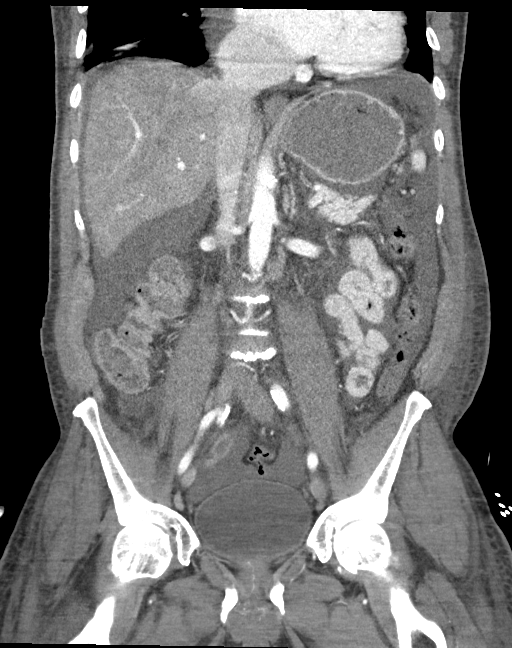

[15 of 46 positions shown; findings below may reference images not displayed]

FINDINGS: CTA CHEST FINDINGS

Cardiovascular: Thoracic aorta shows no aneurysmal dilatation. No
significant opacification is noted to rule out dissection. Mild are
aortic calcifications are noted. The pulmonary artery shows a normal
branching pattern without filling defect to suggest pulmonary
embolism. Coronary calcifications are noted. Heart is enlarged in
size. No pericardial effusion is seen.

Mediastinum/Nodes: Thoracic inlet is within normal limits. No hilar
or mediastinal adenopathy is noted. The esophagus as visualized is
within normal limits.

Lungs/Pleura: Mild right basilar atelectasis is noted. Moderate
right-sided pleural effusion is seen. No nodule or pneumothorax is
seen.

Musculoskeletal: Degenerative changes of the thoracic spine are
noted. No acute bony abnormality is seen.

Review of the MIP images confirms the above findings.

CT ABDOMEN and PELVIS FINDINGS

Hepatobiliary: Decreased attenuation of the liver is noted
consistent with fatty infiltration. The gallbladder is partially
distended.

Pancreas: Unremarkable. No pancreatic ductal dilatation or
surrounding inflammatory changes.

Spleen: Normal in size without focal abnormality.

Adrenals/Urinary Tract: Adrenal glands are within normal limits.
Kidneys are well visualized bilaterally. No renal calculi or
obstructive changes are seen. Scarring is noted in the left kidney.
Normal excretion of contrast material is noted. The bladder is well
distended.

Stomach/Bowel: Appendix is well visualized and demonstrates multiple
small appendicoliths within. No inflammatory changes are seen to
suggest appendicitis. Small bowel and stomach are within normal
limits.

Vascular/Lymphatic: Aortic atherosclerosis. No enlarged abdominal or
pelvic lymph nodes.

Reproductive: Prostate is unremarkable.

Other: No hernia is identified.  Mild ascites is noted.

Musculoskeletal: No acute or significant osseous findings.

Review of the MIP images confirms the above findings.
IMPRESSION: CTA of the chest: No evidence of pulmonary embolism.

Mild right basilar atelectasis with associated right-sided effusion.

CT of the abdomen and pelvis: Fatty infiltration of the liver.

Mild ascites.

No other focal abnormality is noted.

## 2022-03-04 NOTE — Telephone Encounter (Signed)
Advanced Heart Failure Patient Advocate Encounter  PA denied. [facility]) will work on submitting appeal.  Will follow up.

## 2022-03-05 ENCOUNTER — Telehealth (HOSPITAL_COMMUNITY): Payer: Self-pay | Admitting: Pharmacist

## 2022-03-05 NOTE — Telephone Encounter (Signed)
Advanced Heart Failure Patient Advocate Encounter  Prior Authorization for Luiz Iron has been denied.  Appeal submitted via fax.      Audry Riles, PharmD, BCPS, BCCP, CPP Heart Failure Clinic Pharmacist [phone]

## 2022-03-06 ENCOUNTER — Other Ambulatory Visit (HOSPITAL_COMMUNITY): Payer: Self-pay

## 2022-03-06 NOTE — Telephone Encounter (Addendum)
Advanced Heart Failure Patient Advocate Encounter  Prior Authorization for Luiz Iron has been approved.    PA # [id number]  Effective dates: 03/01/22 through "until further notice".  Charlann Boxer, CPhT

## 2022-03-12 ENCOUNTER — Other Ambulatory Visit: Payer: Self-pay

## 2022-03-12 ENCOUNTER — Other Ambulatory Visit (HOSPITAL_COMMUNITY): Payer: Self-pay

## 2022-03-13 ENCOUNTER — Other Ambulatory Visit (HOSPITAL_COMMUNITY): Payer: Self-pay | Admitting: Cardiology

## 2022-04-03 ENCOUNTER — Ambulatory Visit (INDEPENDENT_AMBULATORY_CARE_PROVIDER_SITE_OTHER): Payer: Medicare Other | Admitting: Podiatry

## 2022-04-03 ENCOUNTER — Encounter: Payer: Self-pay | Admitting: Podiatry

## 2022-04-03 DIAGNOSIS — M79675 Pain in left toe(s): Secondary | ICD-10-CM | POA: Diagnosis not present

## 2022-04-03 DIAGNOSIS — E1151 Type 2 diabetes mellitus with diabetic peripheral angiopathy without gangrene: Secondary | ICD-10-CM

## 2022-04-03 DIAGNOSIS — B351 Tinea unguium: Secondary | ICD-10-CM | POA: Diagnosis not present

## 2022-04-03 DIAGNOSIS — M79674 Pain in right toe(s): Secondary | ICD-10-CM

## 2022-04-03 NOTE — Progress Notes (Signed)
This patient returns to my office for at risk foot care.  This patient requires this care by a professional since this patient will be at risk due to having type 2 diabetes.  This patient is unable to cut nails himself since the patient cannot reach his nails.These nails are painful walking and wearing shoes. He presents with male caregiver. This patient presents for at risk foot care today.  General Appearance  Alert, conversant and in no acute stress.  Vascular  Dorsalis pedis and posterior tibial  pulses are  weakly palpable  right.  Absent dorsalis pedis and posterior tibial pulses  left.  Capillary return is within normal limits  bilaterally. Temperature is within normal limits  bilaterally.  Neurologic  Senn-Weinstein monofilament wire test within normal limits  bilaterally. Muscle power within normal limits bilaterally.  Nails Thick disfigured discolored nails with subungual debris  from hallux to fifth toes bilaterally. No evidence of bacterial infection or drainage bilaterally.  Orthopedic  No limitations of motion  feet .  No crepitus or effusions noted.  No bony pathology or digital deformities noted.  Skin  normotropic skin with no porokeratosis noted bilaterally.  No signs of infections or ulcers noted.     Onychomycosis  Pain in right toes  Pain in left toes  Consent was obtained for treatment procedures.   Mechanical debridement of nails 1-5  bilaterally performed with a nail nipper.  Filed with dremel without incident.    Return office visit    3 months                  Told patient to return for periodic foot care and evaluation due to potential at risk complications.   Gardiner Barefoot DPM

## 2022-04-11 ENCOUNTER — Other Ambulatory Visit (HOSPITAL_COMMUNITY): Payer: Self-pay

## 2022-04-11 ENCOUNTER — Encounter (HOSPITAL_COMMUNITY): Payer: Medicare Other | Admitting: Cardiology

## 2022-04-12 ENCOUNTER — Encounter (HOSPITAL_COMMUNITY): Payer: Medicare Other | Admitting: Cardiology

## 2022-04-12 ENCOUNTER — Other Ambulatory Visit: Payer: Self-pay

## 2022-04-12 ENCOUNTER — Other Ambulatory Visit (HOSPITAL_COMMUNITY): Payer: Self-pay

## 2022-04-13 ENCOUNTER — Other Ambulatory Visit (HOSPITAL_COMMUNITY): Payer: Self-pay

## 2022-04-15 ENCOUNTER — Encounter (HOSPITAL_COMMUNITY): Payer: Self-pay | Admitting: Cardiology

## 2022-04-15 ENCOUNTER — Ambulatory Visit (HOSPITAL_COMMUNITY)
Admission: RE | Admit: 2022-04-15 | Discharge: 2022-04-15 | Disposition: A | Discharge status: Home / Self Care | Payer: Medicare Other | Source: Ambulatory Visit | Attending: Cardiology | Admitting: Cardiology

## 2022-04-15 VITALS — BP 102/60 | HR 70 | Wt 134.0 lb

## 2022-04-15 DIAGNOSIS — E119 Type 2 diabetes mellitus without complications: Secondary | ICD-10-CM | POA: Diagnosis not present

## 2022-04-15 DIAGNOSIS — I69320 Aphasia following cerebral infarction: Secondary | ICD-10-CM | POA: Insufficient documentation

## 2022-04-15 DIAGNOSIS — I2510 Atherosclerotic heart disease of native coronary artery without angina pectoris: Secondary | ICD-10-CM | POA: Diagnosis not present

## 2022-04-15 DIAGNOSIS — M79671 Pain in right foot: Secondary | ICD-10-CM | POA: Insufficient documentation

## 2022-04-15 DIAGNOSIS — Z8249 Family history of ischemic heart disease and other diseases of the circulatory system: Secondary | ICD-10-CM | POA: Insufficient documentation

## 2022-04-15 DIAGNOSIS — R202 Paresthesia of skin: Secondary | ICD-10-CM | POA: Insufficient documentation

## 2022-04-15 DIAGNOSIS — I5084 End stage heart failure: Secondary | ICD-10-CM | POA: Diagnosis not present

## 2022-04-15 DIAGNOSIS — I482 Chronic atrial fibrillation, unspecified: Secondary | ICD-10-CM | POA: Insufficient documentation

## 2022-04-15 DIAGNOSIS — I69351 Hemiplegia and hemiparesis following cerebral infarction affecting right dominant side: Secondary | ICD-10-CM | POA: Insufficient documentation

## 2022-04-15 DIAGNOSIS — Z7984 Long term (current) use of oral hypoglycemic drugs: Secondary | ICD-10-CM | POA: Insufficient documentation

## 2022-04-15 DIAGNOSIS — I428 Other cardiomyopathies: Secondary | ICD-10-CM | POA: Insufficient documentation

## 2022-04-15 DIAGNOSIS — I43 Cardiomyopathy in diseases classified elsewhere: Secondary | ICD-10-CM | POA: Insufficient documentation

## 2022-04-15 DIAGNOSIS — M79672 Pain in left foot: Secondary | ICD-10-CM | POA: Diagnosis not present

## 2022-04-15 DIAGNOSIS — E854 Organ-limited amyloidosis: Secondary | ICD-10-CM | POA: Diagnosis not present

## 2022-04-15 DIAGNOSIS — E785 Hyperlipidemia, unspecified: Secondary | ICD-10-CM | POA: Insufficient documentation

## 2022-04-15 DIAGNOSIS — I110 Hypertensive heart disease with heart failure: Secondary | ICD-10-CM | POA: Insufficient documentation

## 2022-04-15 DIAGNOSIS — I5022 Chronic systolic (congestive) heart failure: Secondary | ICD-10-CM | POA: Insufficient documentation

## 2022-04-15 DIAGNOSIS — Z79899 Other long term (current) drug therapy: Secondary | ICD-10-CM | POA: Diagnosis not present

## 2022-04-15 DIAGNOSIS — Z7901 Long term (current) use of anticoagulants: Secondary | ICD-10-CM | POA: Insufficient documentation

## 2022-04-15 LAB — CBC
HCT: 43.8 % (ref 39.0–52.0)
Hemoglobin: 13.8 g/dL (ref 13.0–17.0)
MCH: 28.4 pg (ref 26.0–34.0)
MCHC: 31.5 g/dL (ref 30.0–36.0)
MCV: 90.1 fL (ref 80.0–100.0)
Platelets: 161 10*3/uL (ref 150–400)
RBC: 4.86 MIL/uL (ref 4.22–5.81)
RDW: 14.8 % (ref 11.5–15.5)
WBC: 5.6 10*3/uL (ref 4.0–10.5)
nRBC: 0 % (ref 0.0–0.2)

## 2022-04-15 LAB — LIPID PANEL
Cholesterol: 140 mg/dL (ref 0–200)
HDL: 46 mg/dL (ref >40)
LDL Cholesterol: 81 mg/dL (ref 0–99)
Total CHOL/HDL Ratio: 3 RATIO
Triglycerides: 66 mg/dL (ref <150)
VLDL: 13 mg/dL (ref 0–40)

## 2022-04-15 LAB — BASIC METABOLIC PANEL
Anion gap: 11 (ref 5–15)
BUN: 30 mg/dL — ABNORMAL HIGH (ref 8–23)
CO2: 23 mmol/L (ref 22–32)
Calcium: 9.2 mg/dL (ref 8.9–10.3)
Chloride: 98 mmol/L (ref 98–111)
Creatinine, Ser: 1.34 mg/dL — ABNORMAL HIGH (ref 0.61–1.24)
GFR, Estimated: 58 mL/min — ABNORMAL LOW (ref >60)
Glucose, Bld: 98 mg/dL (ref 70–99)
Potassium: 4.2 mmol/L (ref 3.5–5.1)
Sodium: 132 mmol/L — ABNORMAL LOW (ref 135–145)

## 2022-04-15 NOTE — Progress Notes (Signed)
Advanced [facility] Failure Clinic Note   Referring Physician: PCP: Mirna Mires, MD PCP-Cardiologist: Verne Carrow, MD  HF Cardiology: Dr. Shirlee Latch   HPI:  67 y.o. with history of chronic atrial fibrillation and end stage nonischemic cardiomyopathy returns for followup of CHF.   Patient had CVA in 2010, since that time has had expressive aphasia and right-sided weakness.  He lives with his brother.  In 8/18, he was admited with new atrial flutter and EF was found to be 20%.  He had cath in 8/18 showing mild nonobstructive coronary disease.  In 10/18, he had DCCV to NSR.  In 11/18 while in NSR, echo showed EF up to 45-50%.  By 2019, he was back in atrial fibrillation.  He was not cardioverted again. Echo in 4/21 showed EF 20-25%.     Patient was admitted in 10/21 with h/o increased dyspnea with exertion and developed abdominal swelling.  Failed outpatient diuretic titration. Admitted for IV diuretics. Echo repeated and EF was 20% with mod-severe RV dilation and moderate RV dysfunction, D-shaped septum, dilated IVC, severe biatrial enlargement.  He was in atrial fibrillation in 50s-60s. CT of abdomen showed large volume ascites, had paracentesis with 4.8 L out.  Cardiac MRI showed LV EF 24%, RV mildly dilated with EF 42%, extensive LGE in a non-coronary distribution throughout much of the LV, also involving RV free wall and left and right atrial walls, ECV 42%. This was suggestive of infiltrative disease. 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  He was initially on milrinone for low output HF, this was weaned off for DCCV which he failed. RHC 11/11/19 off milrinone showed optimized filling pressures but low cardiac output, milrinone restarted at 0.125. Discharged w/ home milrinone + home health.  He remained in atrial fibrillation.   He was not thought to be a candidate for LVAD.   Echo in 6/22 showd EF 25-30%, mild LV dilation, mild LVH, moderately decreased RV systolic function, IVC dilated, PASP 49 mmHg.  Echo in 6/23 showed EF 25-30%, global hypokinesis, mild LV dilation, moderately decreased RV systolic function, severe biatrial enlargement, mild MR.   He returns for followup of end-stage CHF.  Weight is down 2 lbs.  History comes mainly from family because of aphasia.  He remains on milrinone 0.125.  He is doing well overall on milrinone. He walks with a cane and is able to do all his ADLs.  No chest pain.  No significant exertional dyspnea walking with his cane.  No orthopnea/PND.  No lightheadedness.  Patient reports pain and tingling in his feet.   ECG (personally reviewed): Atrial fibrillation, nonspecific T wave flattening rate 65  Labs (1/22): K 3.8, creatinine 1.2 Labs (4/22): K 4.2, creatinine 1.27 Labs (7/22): K 4.6, creatinine 1.47 Labs (5/23): K 3.8, creatinine 1.2, hgb 10.3 Labs (9/23): creatinine 1.39, hgb 12.4 Labs (2/24): hgb 13.5, creatinine 1.43  Review of systems complete and found to be negative unless listed in HPI.    PMH: 1. 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  2. DM2 3. Atrial fibrillation: Chronic. Has failed DCCV and amiodarone.  4. Chronic systolic CHF: End-stage nonischemic cardiomyopathy.   - LHC (8/18): Mild nonobstructive CAD.  - RHC (11/21): mean RA 7, PA 29/9, mean PCWP 8, CI 1.6 (thermodilution)/2.5 (Fick) - Echo (11/21): EF 15-20%, D-shaped interventricular septum, severe dilated and severely  dysfunctional RV, severe biatrial enlargement.  - Cardiac MRI (11/21): LV EF 24%, RV mildly dilated with EF 42%, extensive LGE in a non-coronary distribution throughout much of the LV, also involving RV free wall and left and right atrial walls, ECV 42%. This was suggestive of infiltrative disease. 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  - milrinone dependent - Echo (6/22): EF 25-30%, mild LV dilation, mild LVH, moderately decreased RV systolic function, IVC dilated, PASP 49 mmHg. - Echo (6/23): EF 25-30%, mild LV dilation, mild LVH, moderately decreased RV systolic function, IVC dilated, PASP 49 mmHg.  Echo in 6/23 showed EF 25-30%, global hypokinesis, mild LV dilation, moderately decreased RV systolic function, severe biatrial enlargement, mild MR.    Current Outpatient Medications  Medication Sig Dispense Refill   Accu-Chek FastClix Lancets MISC 1 each by Other route as directed.     ACCU-CHEK GUIDE test strip 1 each by Other route See admin instructions.     acetaminophen (TYLENOL) 500 MG tablet Take 1,000 mg by mouth every 6 (six) hours as needed for pain.     dapagliflozin propanediol (FARXIGA) 10 MG TABS tablet Take 1 tablet (10 mg total) by mouth daily before breakfast. 90 tablet 3   ELIQUIS 5 MG TABS tablet TAKE 1 TABLET(5 MG) BY MOUTH TWICE DAILY 180 tablet 2   milrinone (PRIMACOR) 20 MG/100 ML SOLN infusion Inject 0.0139 mg/min into the vein continuous. 100 mL 0   Multiple Vitamin (MULTIVITAMIN WITH MINERALS) TABS Take 1 tablet by mouth daily.     omega-3 acid ethyl esters (LOVAZA) 1 G capsule Take 1 g by mouth daily.     simvastatin (ZOCOR) 40 MG tablet Take 40 mg by mouth daily.     spironolactone (ALDACTONE) 25 MG tablet Take 1 tablet (25 mg total) by mouth daily. 90 tablet 3   Tafamidis (VYNDAMAX) 61 MG CAPS Take 1 capsule by mouth daily. 90 capsule 3   torsemide (DEMADEX) 20 MG tablet Take 20 mg by mouth daily.     TRADJENTA 5 MG TABS tablet Take 5 mg by mouth every morning.     No current facility-administered medications for this encounter.    No Known Allergies    Social History   Socioeconomic History   Marital status: Single    Spouse name: Not on file   Number of children: Not on file   Years of education: Not on file   Highest education  level: Not on file  Occupational History   Not on file  Tobacco Use   Smoking status: Never   Smokeless tobacco: Never  Vaping Use   Vaping Use: Never used  Substance and Sexual Activity   Alcohol use: No   Drug use: No   Sexual activity: Not Currently  Other Topics Concern   Not on file  Social History Narrative   Not on file   Social Determinants of Health   Financial Resource Strain: Not on file  Food Insecurity: Not on file  Transportation Needs: Not on file  Physical Activity: Not on file  Stress: Not on file  Social Connections: Not on file  Intimate Partner Violence: Not on file      Family History  Problem Relation Age of Onset   Hypertension Mother    Diabetes Mother    Heart disease Father     Vitals:   04/15/22 0910  BP: 102/60  Pulse: 70  SpO2: 99%  Weight: 60.8 kg (134 lb)   PHYSICAL EXAM: General: NAD Neck: No JVD, no thyromegaly or thyroid nodule.  Lungs: Clear to auscultation bilaterally with normal respiratory effort. CV: Nondisplaced PMI.  Heart regular S1/S2, no S3/S4, no murmur.  No peripheral edema.  No carotid bruit.  Normal pedal pulses.  Abdomen: Soft, nontender, no hepatosplenomegaly, no distention.  Skin: Intact without lesions or rashes.  Neurologic: Alert and oriented x 3.  Psych: Normal affect. Extremities: No clubbing or cyanosis.  HEENT: Normal.   ASSESSMENT & PLAN:  1. Chronic systolic CHF: Nonischemic cardiomyopathy known since 2018.  Improved initially in 2018 with DCCV, but has been back in atrial fibrillation since 2019 it appears and EF has been low. Admission 10/21 for a/c HF w/ low output requiring milrinone. Echo showed EF 15-20% with mod-severe RV dilation and moderate RV dysfunction, D-shaped septum, dilated IVC, severe biatrial enlargement. Significant RV failure with ascites on CT abdomen, had paracentesis.  Cardiac MRI showed LV EF 24%, RV mildly dilated with EF 42%, extensive LGE in a non-coronary distribution  throughout much of the LV, also involving RV free wall and left and right atrial walls, ECV 42%. This was suggestive of infiltrative disease. 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  He was initially on milrinone for low output HF, this was weaned off for DCCV which he failed. RHC 11/11/19 off milrinone showed optimized filling pressures but low cardiac output, milrinone restarted and has been continued since that time. He was sent home in 11/21 on hospice but hospice has released him. Echo in 6/23 showed EF 25-30%, moderate RV dysfunction.  He is not volume overloaded on exam, NYHA class II symptoms.   - Continue home milrinone 0.125, he has done quite well with this.  No PICC line complications.    - Continue torsemide 20 mg daily, BMET today.  - Continue Farxiga 10 mg daily.      - Continue Spiro 25 mg daily  - BP generally too low for ARB/ARNi - No digoxin with history of bradycardia.   - Narrow QRS, not CRT candidate.  - Not a transplant candidate and not a good candidate for LVAD. Patient assessed by our LVAD nurse, she had significant concerns about his right arm/hand weakness.  He would be unable to manage his LVAD equipment and would need consistent 24 hr caregiver (not available).       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  He has symptoms of peripheral neuropathy with tingling/numbness/pain in his feet.  I will try to get eplontersen for him.  2. Atrial fibrillation: Now chronic.  Initially in 2018, EF improved after DCCV.  However, as above, he has been relatively well rate controlled and has not been in RVR, so do not think that cardiomyopathy is primarily due to AF.  He failed DCCV on amiodarone 10/21, amiodarone stopped due to  bradycardia.  - Continue Eliquis. CBC today.   3. Ascites: Suspect due to RV failure. S/p Paracentesis on 10/31 with 4.8 L out. No evidence of recurrence on exam today  4. H/o CVA: In 2010, with chronic expressive aphasia and right-sided weakness. Stable.  5. Hyperlipidemia: Check lipids today.  Goal LDL < 70 with history of CVA  Followup in 4  months with echo.   Marca Anconaalton Lakeithia Rasor, MD 04/15/22

## 2022-04-15 NOTE — Patient Instructions (Addendum)
You will be contacted about your new medication [name].  Labs done today, your results will be available in MyChart, we will contact you for abnormal readings.  Your physician has requested that you have an echocardiogram. Echocardiography is a painless test that uses sound waves to create images of your heart. It provides your doctor with information about the size and shape of your heart and how well your heart's chambers and valves are working. This procedure takes approximately one hour. There are no restrictions for this procedure. Please do NOT wear cologne, perfume, aftershave, or lotions (deodorant is allowed). Please arrive 15 minutes prior to your appointment time.  Your physician recommends that you schedule a follow-up appointment in: 4 months with an echocardiogram (August) ** please call the office in June to arrange your follow up appointment. **  If you have any questions or concerns before your next appointment please send [name] a message through [place] or call our office at [phone].    TO LEAVE A MESSAGE FOR THE NURSE SELECT OPTION 2, PLEASE LEAVE A MESSAGE INCLUDING: YOUR NAME DATE OF BIRTH CALL BACK NUMBER REASON FOR CALL**this is important as we prioritize the call backs  YOU WILL RECEIVE A CALL BACK THE SAME DAY AS LONG AS YOU CALL BEFORE 4:00 PM  At the [facility], you and your health needs are our priority. As part of our continuing mission to provide you with exceptional heart care, we have created designated Provider Care Teams. These Care Teams include your primary Cardiologist (physician) and Advanced Practice Providers (APPs- Physician Assistants and Nurse Practitioners) who all work together to provide you with the care you need, when you need it.   You may see any of the following providers on your designated Care Team at your next follow up: Dr Arvilla Meres Dr Marca Ancona Dr. Marcos Eke, NP Robbie Lis,  [place] [facility] [place], [place] Brynda Peon, NP Karle Plumber, PharmD   Please be sure to bring in all your medications bottles to every appointment.    Thank you for choosing [facility]

## 2022-04-16 ENCOUNTER — Telehealth (HOSPITAL_COMMUNITY): Payer: Self-pay | Admitting: Pharmacist

## 2022-04-16 ENCOUNTER — Other Ambulatory Visit (HOSPITAL_COMMUNITY): Payer: Self-pay

## 2022-04-16 NOTE — Telephone Encounter (Signed)
Patient Advocate Encounter   Received notification from [facility] that prior authorization for [name] is required.   PA submitted on CoverMyMeds Key [id number] Status is pending   Will continue to follow.   Karle Plumber, PharmD, BCPS, BCCP, CPP Heart Failure Clinic Pharmacist [phone]

## 2022-04-17 ENCOUNTER — Other Ambulatory Visit (HOSPITAL_COMMUNITY): Payer: Self-pay

## 2022-04-17 ENCOUNTER — Other Ambulatory Visit (HOSPITAL_COMMUNITY): Payer: Self-pay | Admitting: Pharmacist

## 2022-04-17 ENCOUNTER — Other Ambulatory Visit: Payer: Self-pay

## 2022-04-17 MED ORDER — WAINUA 45 MG/0.8ML ~~LOC~~ SOAJ
45.0000 mg | SUBCUTANEOUS | 11 refills | Status: DC
  Filled 2022-04-17: qty 0.8, 30d supply, fill #0
  Filled 2022-05-14: qty 0.8, 30d supply, fill #1
  Filled 2022-06-11: qty 0.8, 30d supply, fill #2
  Filled 2022-07-08: qty 0.8, 30d supply, fill #3
  Filled 2022-08-12: qty 0.8, 30d supply, fill #4
  Filled 2022-09-10: qty 0.8, 30d supply, fill #5
  Filled 2022-10-16: qty 0.8, 30d supply, fill #6
  Filled 2022-11-15: qty 0.8, 30d supply, fill #7
  Filled 2022-12-12: qty 0.8, 30d supply, fill #8
  Filled 2023-01-07: qty 0.8, 30d supply, fill #9
  Filled 2023-02-14: qty 0.8, 30d supply, fill #10

## 2022-04-17 NOTE — Telephone Encounter (Signed)
Sent in enrollment application to [facility] for [place]. They will contact patient regarding injection technique and patient education/counseling.   Karle Plumber, PharmD, BCPS, BCCP, CPP Heart Failure Clinic Pharmacist [phone]

## 2022-04-17 NOTE — Telephone Encounter (Signed)
Advanced Heart Failure Patient Advocate Encounter  Prior Authorization for [name] has been approved.    PA# [id number] Effective dates: 04/16/22 through "until further notice"  Patients co-pay is $0.00   Karle Plumber, PharmD, BCPS, BCCP, CPP Heart Failure Clinic Pharmacist [phone]

## 2022-04-19 ENCOUNTER — Other Ambulatory Visit: Payer: Self-pay

## 2022-04-19 ENCOUNTER — Other Ambulatory Visit (HOSPITAL_COMMUNITY): Payer: Self-pay

## 2022-04-22 ENCOUNTER — Other Ambulatory Visit (HOSPITAL_COMMUNITY): Payer: Self-pay | Admitting: Cardiology

## 2022-04-23 ENCOUNTER — Other Ambulatory Visit (HOSPITAL_COMMUNITY): Payer: Self-pay | Admitting: Cardiology

## 2022-04-23 ENCOUNTER — Other Ambulatory Visit (HOSPITAL_COMMUNITY): Payer: Self-pay

## 2022-04-30 ENCOUNTER — Other Ambulatory Visit (HOSPITAL_COMMUNITY): Payer: Self-pay | Admitting: Cardiology

## 2022-05-08 ENCOUNTER — Other Ambulatory Visit: Payer: Self-pay

## 2022-05-09 ENCOUNTER — Other Ambulatory Visit (HOSPITAL_COMMUNITY): Payer: Self-pay

## 2022-05-13 ENCOUNTER — Other Ambulatory Visit (HOSPITAL_COMMUNITY): Payer: Self-pay

## 2022-05-14 ENCOUNTER — Other Ambulatory Visit (HOSPITAL_COMMUNITY): Payer: Self-pay

## 2022-05-15 ENCOUNTER — Other Ambulatory Visit: Payer: Self-pay

## 2022-05-27 ENCOUNTER — Other Ambulatory Visit (HOSPITAL_COMMUNITY): Payer: Self-pay | Admitting: Cardiology

## 2022-05-31 ENCOUNTER — Other Ambulatory Visit (HOSPITAL_COMMUNITY): Payer: Self-pay

## 2022-05-31 MED ORDER — DAPAGLIFLOZIN PROPANEDIOL 10 MG PO TABS: 10.0000 mg | ORAL_TABLET | Freq: Every day | ORAL | 3 refills | Status: DC

## 2022-06-11 ENCOUNTER — Other Ambulatory Visit (HOSPITAL_COMMUNITY): Payer: Self-pay

## 2022-06-12 ENCOUNTER — Other Ambulatory Visit (HOSPITAL_COMMUNITY): Payer: Self-pay

## 2022-06-12 ENCOUNTER — Other Ambulatory Visit (HOSPITAL_COMMUNITY): Payer: Self-pay | Admitting: Cardiology

## 2022-06-20 ENCOUNTER — Other Ambulatory Visit (HOSPITAL_COMMUNITY): Payer: Self-pay

## 2022-06-20 ENCOUNTER — Ambulatory Visit (HOSPITAL_COMMUNITY)
Admission: RE | Admit: 2022-06-20 | Discharge: 2022-06-20 | Disposition: A | Discharge status: Home / Self Care | Payer: Medicare Other | Source: Ambulatory Visit | Attending: Cardiology | Admitting: Cardiology

## 2022-06-20 ENCOUNTER — Telehealth (HOSPITAL_COMMUNITY): Payer: Self-pay | Admitting: Cardiology

## 2022-06-20 DIAGNOSIS — Z452 Encounter for adjustment and management of vascular access device: Secondary | ICD-10-CM | POA: Diagnosis present

## 2022-06-20 DIAGNOSIS — I5022 Chronic systolic (congestive) heart failure: Secondary | ICD-10-CM | POA: Diagnosis present

## 2022-06-20 NOTE — Telephone Encounter (Signed)
[facility] called to report PICC line has been dislodged ~6cm Was able to flush-pull labs-milrinone infusing   Above reviewed with Brittiany Simmons,PA/Emer,RN Will need CXR to confirm placement  -pt aware via brother [name]

## 2022-07-03 ENCOUNTER — Other Ambulatory Visit (HOSPITAL_COMMUNITY): Payer: Self-pay

## 2022-07-03 ENCOUNTER — Other Ambulatory Visit (HOSPITAL_COMMUNITY): Payer: Self-pay | Admitting: Cardiology

## 2022-07-04 ENCOUNTER — Telehealth (HOSPITAL_COMMUNITY): Payer: Self-pay | Admitting: Cardiology

## 2022-07-04 DIAGNOSIS — I5022 Chronic systolic (congestive) heart failure: Secondary | ICD-10-CM

## 2022-07-04 NOTE — Telephone Encounter (Signed)
-  Donita with [facility] called to report PICC line is out x 4 cm at insertion point  Also blue cap will not clamp (worn out)  -note CXR done to check placement 6/15  Please advise if needed

## 2022-07-05 ENCOUNTER — Other Ambulatory Visit (HOSPITAL_COMMUNITY): Payer: Self-pay

## 2022-07-05 ENCOUNTER — Ambulatory Visit (INDEPENDENT_AMBULATORY_CARE_PROVIDER_SITE_OTHER): Payer: Medicare Other | Admitting: Podiatry

## 2022-07-05 ENCOUNTER — Encounter: Payer: Self-pay | Admitting: Podiatry

## 2022-07-05 DIAGNOSIS — E1151 Type 2 diabetes mellitus with diabetic peripheral angiopathy without gangrene: Secondary | ICD-10-CM

## 2022-07-05 DIAGNOSIS — M79675 Pain in left toe(s): Secondary | ICD-10-CM

## 2022-07-05 DIAGNOSIS — M79674 Pain in right toe(s): Secondary | ICD-10-CM

## 2022-07-05 DIAGNOSIS — B351 Tinea unguium: Secondary | ICD-10-CM

## 2022-07-05 NOTE — Progress Notes (Signed)
This patient returns to my office for at risk foot care.  This patient requires this care by a professional since this patient will be at risk due to having type 2 diabetes.  This patient is unable to cut nails himself since the patient cannot reach his nails.These nails are painful walking and wearing shoes. He presents with male caregiver. This patient presents for at risk foot care today. ? ?General Appearance  Alert, conversant and in no acute stress. ? ?Vascular  Dorsalis pedis and posterior tibial  pulses are  weakly palpable  right.  Absent dorsalis pedis and posterior tibial pulses  left.  Capillary return is within normal limits  bilaterally. Temperature is within normal limits  bilaterally. ? ?Neurologic  Senn-Weinstein monofilament wire test within normal limits  bilaterally. Muscle power within normal limits bilaterally. ? ?Nails Thick disfigured discolored nails with subungual debris  from hallux to fifth toes bilaterally. No evidence of bacterial infection or drainage bilaterally. ? ?Orthopedic  No limitations of motion  feet .  No crepitus or effusions noted.  No bony pathology or digital deformities noted. ? ?Skin  normotropic skin with no porokeratosis noted bilaterally.  No signs of infections or ulcers noted.    ? ?Onychomycosis  Pain in right toes  Pain in left toes ? ?Consent was obtained for treatment procedures.   Mechanical debridement of nails 1-5  bilaterally performed with a nail nipper.  Filed with dremel without incident.  ? ? ?Return office visit    3 months                  Told patient to return for periodic foot care and evaluation due to potential at risk complications. ? ? ?Espen Bethel DPM   ? ?

## 2022-07-08 ENCOUNTER — Other Ambulatory Visit: Payer: Self-pay

## 2022-07-08 ENCOUNTER — Other Ambulatory Visit (HOSPITAL_COMMUNITY): Payer: Self-pay

## 2022-07-08 NOTE — Telephone Encounter (Signed)
Will resend regarding picc instructions

## 2022-07-08 NOTE — Telephone Encounter (Signed)
LMOM for IR scheduling  LMOM for Donita with [facility] [phone]  Order placed

## 2022-07-08 NOTE — Telephone Encounter (Signed)
Confirmed milrinone still infusing and able to draw back.  Need to get IR to replace line or go to ED if not able to get in with IR right away.

## 2022-07-09 NOTE — Telephone Encounter (Signed)
No answer with IR Will attempt later

## 2022-07-10 NOTE — Telephone Encounter (Signed)
PICC replacement scheduled for 7/8 @ 2  LMOM for pt  [facility] aware

## 2022-07-15 ENCOUNTER — Other Ambulatory Visit (HOSPITAL_COMMUNITY): Payer: Self-pay | Admitting: Cardiology

## 2022-07-15 ENCOUNTER — Ambulatory Visit (HOSPITAL_COMMUNITY)
Admission: RE | Admit: 2022-07-15 | Discharge: 2022-07-15 | Disposition: A | Discharge status: Home / Self Care | Payer: Medicare Other | Source: Ambulatory Visit | Attending: Cardiology | Admitting: Cardiology

## 2022-07-15 DIAGNOSIS — I429 Cardiomyopathy, unspecified: Secondary | ICD-10-CM | POA: Insufficient documentation

## 2022-07-15 DIAGNOSIS — I5022 Chronic systolic (congestive) heart failure: Secondary | ICD-10-CM

## 2022-07-15 DIAGNOSIS — Z452 Encounter for adjustment and management of vascular access device: Secondary | ICD-10-CM | POA: Diagnosis not present

## 2022-07-15 HISTORY — PX: IR FLUOROGUIDE CV LINE NO REPORT: IMG2394

## 2022-07-15 NOTE — Telephone Encounter (Signed)
Pt aware via niece Jeri Modena, RN has walked patient family ([name] through restarting infusion) as soon as patient is done with replacement. Pam is off campus and unable to be present for restart,

## 2022-07-16 ENCOUNTER — Other Ambulatory Visit (HOSPITAL_COMMUNITY): Payer: Self-pay

## 2022-07-18 ENCOUNTER — Other Ambulatory Visit: Payer: Self-pay

## 2022-07-18 ENCOUNTER — Other Ambulatory Visit (HOSPITAL_COMMUNITY): Payer: Self-pay

## 2022-07-29 ENCOUNTER — Other Ambulatory Visit (HOSPITAL_COMMUNITY): Payer: Self-pay | Admitting: Cardiology

## 2022-08-06 ENCOUNTER — Telehealth (HOSPITAL_COMMUNITY): Payer: Self-pay

## 2022-08-06 NOTE — Telephone Encounter (Signed)
Can we get him an appointment scheduled asap, he is suppose to have an appointment every 3-4 months so medicare can continue to pay for his milrinone, his last appointment was in April.

## 2022-08-08 ENCOUNTER — Other Ambulatory Visit: Payer: Self-pay

## 2022-08-12 ENCOUNTER — Other Ambulatory Visit (HOSPITAL_COMMUNITY): Payer: Self-pay

## 2022-08-13 ENCOUNTER — Other Ambulatory Visit: Payer: Self-pay

## 2022-08-23 ENCOUNTER — Telehealth (HOSPITAL_COMMUNITY): Payer: Self-pay

## 2022-08-23 ENCOUNTER — Encounter (HOSPITAL_COMMUNITY): Payer: Self-pay | Admitting: Cardiology

## 2022-08-23 ENCOUNTER — Ambulatory Visit (HOSPITAL_COMMUNITY): Admission: RE | Admit: 2022-08-23 | Payer: Medicare Other | Source: Ambulatory Visit | Admitting: Cardiology

## 2022-08-23 ENCOUNTER — Ambulatory Visit (HOSPITAL_COMMUNITY)
Admission: RE | Admit: 2022-08-23 | Discharge: 2022-08-23 | Disposition: A | Discharge status: Home / Self Care | Payer: Medicare Other | Source: Ambulatory Visit | Attending: Family Medicine | Admitting: Family Medicine

## 2022-08-23 ENCOUNTER — Other Ambulatory Visit: Payer: Self-pay

## 2022-08-23 ENCOUNTER — Other Ambulatory Visit (HOSPITAL_COMMUNITY): Payer: Self-pay

## 2022-08-23 VITALS — BP 94/60 | HR 72 | Wt 134.0 lb

## 2022-08-23 DIAGNOSIS — Z79899 Other long term (current) drug therapy: Secondary | ICD-10-CM | POA: Insufficient documentation

## 2022-08-23 DIAGNOSIS — R531 Weakness: Secondary | ICD-10-CM | POA: Diagnosis not present

## 2022-08-23 DIAGNOSIS — I69320 Aphasia following cerebral infarction: Secondary | ICD-10-CM | POA: Diagnosis not present

## 2022-08-23 DIAGNOSIS — E1142 Type 2 diabetes mellitus with diabetic polyneuropathy: Secondary | ICD-10-CM | POA: Insufficient documentation

## 2022-08-23 DIAGNOSIS — I482 Chronic atrial fibrillation, unspecified: Secondary | ICD-10-CM | POA: Insufficient documentation

## 2022-08-23 DIAGNOSIS — E785 Hyperlipidemia, unspecified: Secondary | ICD-10-CM | POA: Diagnosis not present

## 2022-08-23 DIAGNOSIS — I4892 Unspecified atrial flutter: Secondary | ICD-10-CM | POA: Insufficient documentation

## 2022-08-23 DIAGNOSIS — I5022 Chronic systolic (congestive) heart failure: Secondary | ICD-10-CM

## 2022-08-23 DIAGNOSIS — I43 Cardiomyopathy in diseases classified elsewhere: Secondary | ICD-10-CM | POA: Insufficient documentation

## 2022-08-23 DIAGNOSIS — I5042 Chronic combined systolic (congestive) and diastolic (congestive) heart failure: Secondary | ICD-10-CM | POA: Insufficient documentation

## 2022-08-23 DIAGNOSIS — I428 Other cardiomyopathies: Secondary | ICD-10-CM | POA: Insufficient documentation

## 2022-08-23 DIAGNOSIS — R188 Other ascites: Secondary | ICD-10-CM | POA: Insufficient documentation

## 2022-08-23 DIAGNOSIS — I340 Nonrheumatic mitral (valve) insufficiency: Secondary | ICD-10-CM | POA: Insufficient documentation

## 2022-08-23 DIAGNOSIS — Z7901 Long term (current) use of anticoagulants: Secondary | ICD-10-CM | POA: Diagnosis not present

## 2022-08-23 LAB — BASIC METABOLIC PANEL
Anion gap: 14 (ref 5–15)
BUN: 21 mg/dL (ref 8–23)
CO2: 23 mmol/L (ref 22–32)
Calcium: 8.8 mg/dL — ABNORMAL LOW (ref 8.9–10.3)
Chloride: 97 mmol/L — ABNORMAL LOW (ref 98–111)
Creatinine, Ser: 1.3 mg/dL — ABNORMAL HIGH (ref 0.61–1.24)
GFR, Estimated: >60 mL/min (ref >60)
Glucose, Bld: 101 mg/dL — ABNORMAL HIGH (ref 70–99)
Potassium: 3.4 mmol/L — ABNORMAL LOW (ref 3.5–5.1)
Sodium: 134 mmol/L — ABNORMAL LOW (ref 135–145)

## 2022-08-23 LAB — ECHOCARDIOGRAM COMPLETE
Area-P 1/2: 4.39 cm2
Calc EF: 30.9 %
MV M vel: 3.77 m/s
MV Peak grad: 56.9 mmHg
Radius: 0.7 cm
S' Lateral: 4 cm
Single Plane A2C EF: 32.3 %
Single Plane A4C EF: 30.5 %

## 2022-08-23 LAB — BRAIN NATRIURETIC PEPTIDE: B Natriuretic Peptide: 347.6 pg/mL — ABNORMAL HIGH (ref 0.0–100.0)

## 2022-08-23 MED ORDER — TORSEMIDE 20 MG PO TABS
30.0000 mg | ORAL_TABLET | Freq: Every day | ORAL | 3 refills | Status: DC
  Filled 2022-08-23: qty 135, 90d supply, fill #0

## 2022-08-23 NOTE — Progress Notes (Signed)
  Echocardiogram 2D Echocardiogram has been performed.  [name] 08/23/2022, 10:43 AM

## 2022-08-23 NOTE — Patient Instructions (Signed)
INCREASE Torsemide to 30 mg ( 1 1/2 Tab) daily.  Labs done today, your results will be available in MyChart, we will contact you for abnormal readings.  Blood work in 10 days.  Your physician recommends that you schedule a follow-up appointment in: 2 months.  If you have any questions or concerns before your next appointment please send [name] a message through [place] or call our office at [phone].    TO LEAVE A MESSAGE FOR THE NURSE SELECT OPTION 2, PLEASE LEAVE A MESSAGE INCLUDING: YOUR NAME DATE OF BIRTH CALL BACK NUMBER REASON FOR CALL**this is important as we prioritize the call backs  YOU WILL RECEIVE A CALL BACK THE SAME DAY AS LONG AS YOU CALL BEFORE 4:00 PM  At the Advanced [facility] Clinic, you and your health needs are our priority. As part of our continuing mission to provide you with exceptional heart care, we have created designated Provider Care Teams. These Care Teams include your primary Cardiologist (physician) and Advanced Practice Providers (APPs- Physician Assistants and Nurse Practitioners) who all work together to provide you with the care you need, when you need it.   You may see any of the following providers on your designated Care Team at your next follow up: Dr Arvilla Meres Dr Marca Ancona Dr. Marcos Eke, NP Robbie Lis, [place] [facility] [place], [place] Brynda Peon, NP Karle Plumber, PharmD   Please be sure to bring in all your medications bottles to every appointment.    Thank you for choosing [facility]

## 2022-08-23 NOTE — Telephone Encounter (Signed)
Repeat labs ordered faxed to [facility]

## 2022-08-25 NOTE — Progress Notes (Signed)
Advanced Heart Failure Clinic Note   Referring Physician: PCP: Mirna Mires, MD PCP-Cardiologist: Verne Carrow, MD  HF Cardiology: Dr. Shirlee Latch   HPI:  67 y.o. with history of chronic atrial fibrillation and end stage nonischemic cardiomyopathy returns for followup of CHF.   Patient had CVA in 2010, since that time has had expressive aphasia and right-sided weakness.  He lives with his brother.  In 8/18, he was admited with new atrial flutter and EF was found to be 20%.  He had cath in 8/18 showing mild nonobstructive coronary disease.  In 10/18, he had DCCV to NSR.  In 11/18 while in NSR, echo showed EF up to 45-50%.  By 2019, he was back in atrial fibrillation.  He was not cardioverted again. Echo in 4/21 showed EF 20-25%.     Patient was admitted in 10/21 with h/o increased dyspnea with exertion and developed abdominal swelling.  Failed outpatient diuretic titration. Admitted for IV diuretics. Echo repeated and EF was 20% with mod-severe RV dilation and moderate RV dysfunction, D-shaped septum, dilated IVC, severe biatrial enlargement.  He was in atrial fibrillation in 50s-60s. CT of abdomen showed large volume ascites, had paracentesis with 4.8 L out.  Cardiac MRI showed LV EF 24%, RV mildly dilated with EF 42%, extensive LGE in a non-coronary distribution throughout much of the LV, also involving RV free wall and left and right atrial walls, ECV 42%. This was suggestive of infiltrative disease. 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  He was initially on milrinone for low output HF, this was weaned off for DCCV which he failed. RHC 11/11/19 off milrinone showed optimized filling pressures but low cardiac output, milrinone restarted at 0.125. Discharged w/ home milrinone + home health.  He remained in atrial fibrillation.   He was not thought to be a candidate for LVAD.   Echo in 6/22 showd EF 25-30%, mild LV dilation, mild LVH, moderately decreased RV systolic function, IVC dilated, PASP 49 mmHg.  Echo in 6/23 showed EF 25-30%, global hypokinesis, mild LV dilation, moderately decreased RV systolic function, severe biatrial enlargement, mild MR.   Echo was done today and reviewed, EF 25-30%, no LVH, moderate decreased RV systolic function, moderate MR, severe biatrial enlargement, IVC dilated.   He returns for followup of end-stage CHF.  Weight is stable.  History comes mainly from family because of aphasia.  He remains on milrinone 0.125. He is now on eplontersen, says that neuropathic symptoms in feet are improved. Walking more, using cane. Has chronic right-sided weakness.  Does not appear particularly short of breath to his family.  No chest pain.    ECG (personally reviewed): Atypical flutter, rate 70  Labs (1/22): K 3.8, creatinine 1.2 Labs (4/22): K 4.2, creatinine 1.27 Labs (7/22): K 4.6, creatinine 1.47 Labs (5/23): K 3.8, creatinine 1.2, hgb 10.3 Labs (9/23): creatinine 1.39, hgb 12.4 Labs (2/24): hgb 13.5, creatinine 1.43 Labs (4/24): LDL 81 Labs (7/24): hgb 13.5, plts 139, K 4, creatinine 1.38  Review of systems complete and found to be negative unless listed in HPI.    PMH: 1. 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  2. DM2 3. Atrial fibrillation: Chronic. Has failed DCCV and amiodarone.  4. Chronic systolic CHF: End-stage nonischemic cardiomyopathy.   - LHC (8/18): Mild nonobstructive CAD.  - RHC (11/21): mean RA 7, PA  29/9, mean PCWP 8, CI 1.6 (thermodilution)/2.5 (Fick) - Echo (11/21): EF 15-20%, D-shaped interventricular septum, severe dilated and severely dysfunctional RV, severe biatrial enlargement.  - Cardiac MRI (11/21): LV EF 24%, RV mildly dilated with EF 42%, extensive LGE in a non-coronary distribution throughout much of the LV, also involving RV free wall  and left and right atrial walls, ECV 42%. This was suggestive of infiltrative disease. 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  - milrinone dependent - Echo (6/22): EF 25-30%, mild LV dilation, mild LVH, moderately decreased RV systolic function, IVC dilated, PASP 49 mmHg. - Echo (6/23): EF 25-30%, mild LV dilation, mild LVH, moderately decreased RV systolic function, IVC dilated, PASP 49 mmHg.  Echo in 6/23 showed EF 25-30%, global hypokinesis, mild LV dilation, moderately decreased RV systolic function, severe biatrial enlargement, mild MR.  - Echo (8/24): EF 25-30%, no LVH, moderate decreased RV systolic function, moderate MR, severe biatrial enlargement, IVC dilated.    Current Outpatient Medications  Medication Sig Dispense Refill   Accu-Chek FastClix Lancets MISC 1 each by Other route as directed.     ACCU-CHEK GUIDE test strip 1 each by Other route See admin instructions.     acetaminophen (TYLENOL) 500 MG tablet Take 1,000 mg by mouth every 6 (six) hours as needed for pain.     dapagliflozin propanediol (FARXIGA) 10 MG TABS tablet Take 1 tablet (10 mg total) by mouth daily before breakfast. 90 tablet 3   ELIQUIS 5 MG TABS tablet TAKE 1 TABLET(5 MG) BY MOUTH TWICE DAILY 180 tablet 2   Eplontersen Sodium ([name]) 45 MG/0.8ML SOAJ Inject 45 mg into the skin every 30 (thirty) days. 0.8 mL 11   milrinone (PRIMACOR) 20 MG/100 ML SOLN infusion Inject 0.0139 mg/min into the vein continuous. 100 mL 0   Multiple Vitamin (MULTIVITAMIN WITH MINERALS) TABS Take 1 tablet by mouth daily.     omega-3 acid ethyl esters (LOVAZA) 1 G capsule Take 1 g by mouth daily.     simvastatin (ZOCOR) 40 MG tablet Take 40 mg by mouth daily.     spironolactone (ALDACTONE) 25 MG tablet Take 1 tablet (25 mg total) by mouth daily. 90 tablet 3   Tafamidis (VYNDAMAX) 61 MG CAPS Take 1 capsule by mouth daily. 90 capsule 3   TRADJENTA 5 MG  TABS tablet Take 5 mg by mouth every morning.     torsemide (DEMADEX) 20 MG tablet Take 1.5 tablets (30 mg total) by mouth daily. 200 tablet 3   No current facility-administered medications for this encounter.    No Known Allergies    Social History   Socioeconomic History   Marital status: Single    Spouse name: Not on file   Number of children: Not on file   Years of education: Not on file   Highest education level: Not on file  Occupational History   Not on file  Tobacco Use   Smoking status: Never   Smokeless tobacco: Never  Vaping Use   Vaping status: Never Used  Substance and Sexual Activity   Alcohol use: No   Drug use: No   Sexual activity: Not Currently  Other Topics Concern   Not on file  Social History Narrative   Not on file   Social Determinants of Health   Financial Resource Strain: Not on file  Food Insecurity: Not on file  Transportation Needs: Not on file  Physical Activity:  Not on file  Stress: Not on file  Social Connections: Not on file  Intimate Partner Violence: Not on file      Family History  Problem Relation Age of Onset   Hypertension Mother    Diabetes Mother    Heart disease Father     Vitals:   08/23/22 1046  BP: 94/60  Pulse: 72  SpO2: 100%  Weight: 60.8 kg (134 lb)   PHYSICAL EXAM: General: NAD Neck: No JVD, no thyromegaly or thyroid nodule.  Lungs: Clear to auscultation bilaterally with normal respiratory effort. CV: Nondisplaced PMI.  Heart irregular S1/S2, no S3/S4, no murmur.  No peripheral edema.  No carotid bruit.  Normal pedal pulses.  Abdomen: Soft, nontender, no hepatosplenomegaly, no distention.  Skin: Intact without lesions or rashes.  Neurologic: Aphasic, right-sided weakness  Psych: Normal affect. Extremities: No clubbing or cyanosis.  HEENT: Normal.   ASSESSMENT & PLAN:  1. Chronic systolic CHF: Nonischemic cardiomyopathy known since 2018.  Improved initially in 2018 with DCCV, but has been back in  atrial fibrillation since 2019 it appears and EF has been low. Admission 10/21 for a/c HF w/ low output requiring milrinone. Echo showed EF 15-20% with mod-severe RV dilation and moderate RV dysfunction, D-shaped septum, dilated IVC, severe biatrial enlargement. Significant RV failure with ascites on CT abdomen, had paracentesis.  Cardiac MRI showed LV EF 24%, RV mildly dilated with EF 42%, extensive LGE in a non-coronary distribution throughout much of the LV, also involving RV free wall and left and right atrial walls, ECV 42%. This was suggestive of infiltrative disease. 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  He was initially on milrinone for low output HF, this was weaned off for DCCV which he failed. RHC 11/11/19 off milrinone showed optimized filling pressures but low cardiac output, milrinone restarted and has been continued since that time. He was sent home in 11/21 on hospice but hospice has released him. Echo in 6/23 showed EF 25-30%, moderate RV dysfunction.  Echo today showed EF 25-30%, no LVH, moderate decreased RV systolic function, moderate MR, severe biatrial enlargement, IVC dilated. He did not look volume overloaded on exam but IVC was dilated on echo done today, suggesting volume overloaded.   NYHA class II.  - Continue home milrinone 0.125, he has done quite well with this.  No PICC line complications.    - I will gently increase torsemide to 30 mg dailiy.  BMET/BNP today and BMET in 10 days.  - Continue Farxiga 10 mg daily.      - Continue Spiro 25 mg daily  - BP too low for ARB/ARNi - No digoxin with history of bradycardia.   - Narrow QRS, not CRT candidate.  - Not a transplant candidate and not a good candidate for LVAD. Patient assessed for LVAD, we had significant  concerns about his right arm/hand weakness.  He would be unable to manage his LVAD equipment and would need consistent 24 hr caregiver (not available).       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  He has also been started on eplontersen with improvement in peripheral neuropathy symptoms.  2. Atrial fibrillation/flutter: Now permanent.  Initially in 2018, EF improved after DCCV.  However, as above, he has been relatively well  rate controlled and has not been in RVR, so do not think that cardiomyopathy is primarily due to AF.  He failed DCCV on amiodarone 10/21, amiodarone stopped due to bradycardia. He is in atypical flutter today.  - Continue Eliquis.  3. Ascites: Suspect due to RV failure. S/p Paracentesis on 10/31 with 4.8 L out. No evidence of recurrence on exam today  4. H/o CVA: In 2010, with chronic expressive aphasia and right-sided weakness. Stable.  5. Hyperlipidemia: Lipids stable in 4/24.   Followup in 2 months with APP.   Marca Ancona, MD 08/25/22

## 2022-08-26 ENCOUNTER — Telehealth (HOSPITAL_COMMUNITY): Payer: Self-pay

## 2022-08-26 MED ORDER — POTASSIUM CHLORIDE ER 10 MEQ PO TBCR: 20.0000 meq | EXTENDED_RELEASE_TABLET | Freq: Every day | ORAL | 3 refills | Status: DC

## 2022-08-26 NOTE — Telephone Encounter (Addendum)
Pt aware, agreeable, and verbalized understanding  Rx ordered   ----- Message from Marca Ancona sent at 08/23/2022  8:27 PM EDT ----- Increase total daily KCl by 20 mEq. BMET 1 week.

## 2022-08-29 ENCOUNTER — Other Ambulatory Visit (HOSPITAL_COMMUNITY): Payer: Self-pay | Admitting: Cardiology

## 2022-09-10 ENCOUNTER — Other Ambulatory Visit (HOSPITAL_COMMUNITY): Payer: Self-pay

## 2022-09-12 ENCOUNTER — Other Ambulatory Visit (HOSPITAL_COMMUNITY): Payer: Self-pay

## 2022-09-16 ENCOUNTER — Other Ambulatory Visit (HOSPITAL_COMMUNITY): Payer: Self-pay | Admitting: Cardiology

## 2022-09-18 ENCOUNTER — Other Ambulatory Visit: Payer: Self-pay

## 2022-09-18 ENCOUNTER — Other Ambulatory Visit (HOSPITAL_COMMUNITY): Payer: Self-pay

## 2022-09-19 ENCOUNTER — Other Ambulatory Visit (HOSPITAL_COMMUNITY): Payer: Self-pay

## 2022-09-28 ENCOUNTER — Encounter (HOSPITAL_COMMUNITY): Payer: Self-pay

## 2022-10-07 ENCOUNTER — Encounter: Payer: Self-pay | Admitting: Podiatry

## 2022-10-07 ENCOUNTER — Ambulatory Visit (INDEPENDENT_AMBULATORY_CARE_PROVIDER_SITE_OTHER): Payer: Medicare Other | Admitting: Podiatry

## 2022-10-07 DIAGNOSIS — B351 Tinea unguium: Secondary | ICD-10-CM | POA: Diagnosis not present

## 2022-10-07 DIAGNOSIS — E1151 Type 2 diabetes mellitus with diabetic peripheral angiopathy without gangrene: Secondary | ICD-10-CM | POA: Diagnosis not present

## 2022-10-07 DIAGNOSIS — M79675 Pain in left toe(s): Secondary | ICD-10-CM | POA: Diagnosis not present

## 2022-10-07 DIAGNOSIS — M79674 Pain in right toe(s): Secondary | ICD-10-CM | POA: Diagnosis not present

## 2022-10-07 NOTE — Progress Notes (Signed)
This patient returns to my office for at risk foot care.  This patient requires this care by a professional since this patient will be at risk due to having type 2 diabetes.  This patient is unable to cut nails himself since the patient cannot reach his nails.These nails are painful walking and wearing shoes. He presents with male caregiver. This patient presents for at risk foot care today.  General Appearance  Alert, conversant and in no acute stress.  Vascular  Dorsalis pedis and posterior tibial  pulses are  weakly palpable  right.  Absent dorsalis pedis and posterior tibial pulses  left.  Capillary return is within normal limits  bilaterally. Temperature is within normal limits  bilaterally.  Neurologic  Senn-Weinstein monofilament wire test within normal limits  bilaterally. Muscle power within normal limits bilaterally.  Nails Thick disfigured discolored nails with subungual debris  from hallux to fifth toes bilaterally. No evidence of bacterial infection or drainage bilaterally.  Orthopedic  No limitations of motion  feet .  No crepitus or effusions noted.  No bony pathology or digital deformities noted.  Skin  normotropic skin with no porokeratosis noted bilaterally.  No signs of infections or ulcers noted.     Onychomycosis  Pain in right toes  Pain in left toes  Consent was obtained for treatment procedures.   Mechanical debridement of nails 1-5  bilaterally performed with a nail nipper.  Filed with dremel without incident.    Return office visit    3 months                  Told patient to return for periodic foot care and evaluation due to potential at risk complications.   Samari Bittinger DPM    

## 2022-10-11 ENCOUNTER — Other Ambulatory Visit (HOSPITAL_COMMUNITY): Payer: Self-pay

## 2022-10-14 ENCOUNTER — Other Ambulatory Visit (HOSPITAL_COMMUNITY): Payer: Self-pay | Admitting: Cardiology

## 2022-10-14 ENCOUNTER — Other Ambulatory Visit (HOSPITAL_COMMUNITY): Payer: Self-pay

## 2022-10-16 ENCOUNTER — Other Ambulatory Visit (HOSPITAL_COMMUNITY): Payer: Self-pay

## 2022-10-16 ENCOUNTER — Other Ambulatory Visit (HOSPITAL_COMMUNITY): Payer: Self-pay | Admitting: Pharmacy Technician

## 2022-10-16 NOTE — Progress Notes (Signed)
Specialty Pharmacy Refill Coordination Note  [name] is a 67 y.o. male contacted today regarding refills of specialty medication(s) No medications found.  Spoke with Niece  Patient requested Delivery   Delivery date: 10/22/22   Verified address: [address] [place]   Medication will be filled on 10/21/22.

## 2022-10-23 ENCOUNTER — Ambulatory Visit (HOSPITAL_COMMUNITY)
Admission: RE | Admit: 2022-10-23 | Discharge: 2022-10-23 | Disposition: A | Discharge status: Home / Self Care | Payer: Medicare Other | Source: Ambulatory Visit | Attending: Family Medicine | Admitting: Family Medicine

## 2022-10-24 ENCOUNTER — Other Ambulatory Visit (HOSPITAL_COMMUNITY): Payer: Self-pay | Admitting: Cardiology

## 2022-11-05 ENCOUNTER — Encounter (HOSPITAL_COMMUNITY): Payer: Self-pay

## 2022-11-05 ENCOUNTER — Ambulatory Visit (HOSPITAL_COMMUNITY)
Admission: RE | Admit: 2022-11-05 | Discharge: 2022-11-05 | Disposition: A | Discharge status: Home / Self Care | Payer: Medicare Other | Source: Ambulatory Visit | Attending: Family Medicine | Admitting: Family Medicine

## 2022-11-05 VITALS — BP 90/60 | HR 57 | Wt 132.6 lb

## 2022-11-05 DIAGNOSIS — I4892 Unspecified atrial flutter: Secondary | ICD-10-CM | POA: Diagnosis not present

## 2022-11-05 DIAGNOSIS — I482 Chronic atrial fibrillation, unspecified: Secondary | ICD-10-CM | POA: Diagnosis present

## 2022-11-05 DIAGNOSIS — E785 Hyperlipidemia, unspecified: Secondary | ICD-10-CM | POA: Insufficient documentation

## 2022-11-05 DIAGNOSIS — I43 Cardiomyopathy in diseases classified elsewhere: Secondary | ICD-10-CM | POA: Insufficient documentation

## 2022-11-05 DIAGNOSIS — I484 Atypical atrial flutter: Secondary | ICD-10-CM | POA: Insufficient documentation

## 2022-11-05 DIAGNOSIS — I69351 Hemiplegia and hemiparesis following cerebral infarction affecting right dominant side: Secondary | ICD-10-CM | POA: Diagnosis not present

## 2022-11-05 DIAGNOSIS — E11649 Type 2 diabetes mellitus with hypoglycemia without coma: Secondary | ICD-10-CM | POA: Diagnosis not present

## 2022-11-05 DIAGNOSIS — Z87898 Personal history of other specified conditions: Secondary | ICD-10-CM | POA: Diagnosis not present

## 2022-11-05 DIAGNOSIS — R188 Other ascites: Secondary | ICD-10-CM | POA: Insufficient documentation

## 2022-11-05 DIAGNOSIS — I5022 Chronic systolic (congestive) heart failure: Secondary | ICD-10-CM | POA: Diagnosis not present

## 2022-11-05 DIAGNOSIS — I5084 End stage heart failure: Secondary | ICD-10-CM | POA: Insufficient documentation

## 2022-11-05 DIAGNOSIS — I428 Other cardiomyopathies: Secondary | ICD-10-CM | POA: Diagnosis not present

## 2022-11-05 DIAGNOSIS — I69320 Aphasia following cerebral infarction: Secondary | ICD-10-CM | POA: Insufficient documentation

## 2022-11-05 DIAGNOSIS — Z79899 Other long term (current) drug therapy: Secondary | ICD-10-CM | POA: Diagnosis not present

## 2022-11-05 DIAGNOSIS — I2510 Atherosclerotic heart disease of native coronary artery without angina pectoris: Secondary | ICD-10-CM | POA: Diagnosis not present

## 2022-11-05 DIAGNOSIS — Z7984 Long term (current) use of oral hypoglycemic drugs: Secondary | ICD-10-CM | POA: Diagnosis not present

## 2022-11-05 DIAGNOSIS — Z7901 Long term (current) use of anticoagulants: Secondary | ICD-10-CM | POA: Diagnosis not present

## 2022-11-05 DIAGNOSIS — E162 Hypoglycemia, unspecified: Secondary | ICD-10-CM

## 2022-11-05 NOTE — Progress Notes (Signed)
Advanced Heart Failure Clinic Note   Referring Physician: PCP: Mirna Mires, MD PCP-Cardiologist: Verne Carrow, MD  HF Cardiology: Dr. Shirlee Latch   HPI:  67 y.o. with history of chronic atrial fibrillation and end stage nonischemic cardiomyopathy returns for followup of CHF.   Patient had CVA in 2010, since that time has had expressive aphasia and right-sided weakness.  He lives with his brother.  In 8/18, he was admited with new atrial flutter and EF was found to be 20%.  He had cath in 8/18 showing mild nonobstructive coronary disease.  In 10/18, he had DCCV to NSR.  In 11/18 while in NSR, echo showed EF up to 45-50%.  By 2019, he was back in atrial fibrillation.  He was not cardioverted again. Echo in 4/21 showed EF 20-25%.     Patient was admitted in 10/21 with h/o increased dyspnea with exertion and developed abdominal swelling.  Failed outpatient diuretic titration. Admitted for IV diuretics. Echo repeated and EF was 20% with mod-severe RV dilation and moderate RV dysfunction, D-shaped septum, dilated IVC, severe biatrial enlargement.  He was in atrial fibrillation in 50s-60s. CT of abdomen showed large volume ascites, had paracentesis with 4.8 L out.  Cardiac MRI showed LV EF 24%, RV mildly dilated with EF 42%, extensive LGE in a non-coronary distribution throughout much of the LV, also involving RV free wall and left and right atrial walls, ECV 42%. This was suggestive of infiltrative disease. 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  He was initially on milrinone for low output HF, this was weaned off for DCCV which he failed. RHC 11/11/19 off milrinone showed optimized filling pressures but low cardiac output, milrinone restarted at 0.125. Discharged w/ home milrinone + home health.  He remained in atrial fibrillation.   He was not thought to be a candidate for LVAD.   Echo in 6/22 showd EF 25-30%, mild LV dilation, mild LVH, moderately decreased RV systolic function, IVC dilated, PASP 49 mmHg.  Echo in 6/23 showed EF 25-30%, global hypokinesis, mild LV dilation, moderately decreased RV systolic function, severe biatrial enlargement, mild MR.   Echo 8/24 EF 25-30%, no LVH, moderate decreased RV systolic function, moderate MR, severe biatrial enlargement, IVC dilated.   He returns for followup of end-stage CHF with his niece.  History provided from niece because of aphasia.  He remains on milrinone 0.125. He is not SOB walking with his cane, has dyspnea if he rushes. Has chronic right-sided weakness. Has some leg aching but no swelling. Denies neuropathic symptoms in feet. Denies chest pain, dizziness, abnormal bleeding, PND/orthopnea.   ECG (personally reviewed): none ordered today.  Labs (1/22): K 3.8, creatinine 1.2 Labs (4/22): K 4.2, creatinine 1.27 Labs (7/22): K 4.6, creatinine 1.47 Labs (5/23): K 3.8, creatinine 1.2, hgb 10.3 Labs (9/23): creatinine 1.39, hgb 12.4 Labs (2/24): hgb 13.5, creatinine 1.43 Labs (4/24): LDL 81 Labs (7/24): hgb 13.5, plts 139, K 4, creatinine 1.38 Labs (10/24): K 4.5, creatinine 1.55, hgb 13.3, plts 164  Review of systems complete and found to be negative unless listed in HPI.    PMH: 1. 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  2. DM2 3. Atrial fibrillation: Chronic. Has failed DCCV and amiodarone.  4. Chronic systolic CHF: End-stage nonischemic cardiomyopathy.   - LHC (8/18): Mild nonobstructive CAD.  - RHC (11/21): mean RA  7, PA 29/9, mean PCWP 8, CI 1.6 (thermodilution)/2.5 (Fick) - Echo (11/21): EF 15-20%, D-shaped interventricular septum, severe dilated and severely dysfunctional RV, severe biatrial enlargement.  - Cardiac MRI (11/21): LV EF 24%, RV mildly dilated with EF 42%, extensive LGE in a non-coronary distribution throughout much of  the LV, also involving RV free wall and left and right atrial walls, ECV 42%. This was suggestive of infiltrative disease. 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  - milrinone dependent - Echo (6/22): EF 25-30%, mild LV dilation, mild LVH, moderately decreased RV systolic function, IVC dilated, PASP 49 mmHg. - Echo (6/23): EF 25-30%, mild LV dilation, mild LVH, moderately decreased RV systolic function, IVC dilated, PASP 49 mmHg.  Echo in 6/23 showed EF 25-30%, global hypokinesis, mild LV dilation, moderately decreased RV systolic function, severe biatrial enlargement, mild MR.  - Echo (8/24): EF 25-30%, no LVH, moderate decreased RV systolic function, moderate MR, severe biatrial enlargement, IVC dilated.    Current Outpatient Medications  Medication Sig Dispense Refill   Accu-Chek FastClix Lancets MISC 1 each by Other route as directed.     ACCU-CHEK GUIDE test strip 1 each by Other route See admin instructions.     acetaminophen (TYLENOL) 500 MG tablet Take 1,000 mg by mouth every 6 (six) hours as needed for pain.     dapagliflozin propanediol (FARXIGA) 10 MG TABS tablet Take 1 tablet (10 mg total) by mouth daily before breakfast. 90 tablet 3   ELIQUIS 5 MG TABS tablet TAKE 1 TABLET(5 MG) BY MOUTH TWICE DAILY 180 tablet 2   Eplontersen Sodium ([name]) 45 MG/0.8ML SOAJ Inject 45 mg into the skin every 30 (thirty) days. 0.8 mL 11   Loratadine (CLARITIN PO) Take 10 mg by mouth as needed.     milrinone (PRIMACOR) 20 MG/100 ML SOLN infusion Inject 0.0139 mg/min into the vein continuous. 100 mL 0   Multiple Vitamin (MULTIVITAMIN WITH MINERALS) TABS Take 1 tablet by mouth daily.     omega-3 acid ethyl esters (LOVAZA) 1 G capsule Take 1 g by mouth daily.     potassium chloride (KLOR-CON) 10 MEQ tablet Take 2 tablets (20 mEq total) by mouth daily. 180 tablet 3   simvastatin (ZOCOR) 40 MG tablet Take 40 mg by mouth  daily.     spironolactone (ALDACTONE) 25 MG tablet Take 1 tablet (25 mg total) by mouth daily. 90 tablet 3   Tafamidis (VYNDAMAX) 61 MG CAPS Take 1 capsule by mouth daily. 90 capsule 3   torsemide (DEMADEX) 20 MG tablet Take 1.5 tablets (30 mg total) by mouth daily. 200 tablet 3   TRADJENTA 5 MG TABS tablet Take 5 mg by mouth every morning.     No current facility-administered medications for this encounter.    No Known Allergies    Social History   Socioeconomic History   Marital status: Single    Spouse name: Not on file   Number of children: Not on file   Years of education: Not on file   Highest education level: Not on file  Occupational History   Not on file  Tobacco Use   Smoking status: Never   Smokeless tobacco: Never  Vaping Use   Vaping status: Never Used  Substance and Sexual Activity   Alcohol use: No   Drug use: No   Sexual activity: Not Currently  Other Topics Concern   Not on file  Social  History Narrative   Not on file   Social Determinants of Health   Financial Resource Strain: Not on file  Food Insecurity: Not on file  Transportation Needs: Not on file  Physical Activity: Not on file  Stress: Not on file  Social Connections: Not on file  Intimate Partner Violence: Not on file   Family History  Problem Relation Age of Onset   Hypertension Mother    Diabetes Mother    Heart disease Father    BP 90/60   Pulse (!) 57   Wt 60.1 kg (132 lb 9.6 oz)   SpO2 98%   BMI 20.77 kg/m   Wt Readings from Last 3 Encounters:  11/05/22 60.1 kg (132 lb 9.6 oz)  08/23/22 60.8 kg (134 lb)  04/15/22 60.8 kg (134 lb)   PHYSICAL EXAM: General:  NAD. No resp difficulty, walked into clinic, thin HEENT: Normal Neck: Supple. No JVD. Carotids 2+ bilat; no bruits. No lymphadenopathy or thryomegaly appreciated. Cor: PMI nondisplaced. Irregular rate & rhythm. No rubs, gallops or murmurs. Lungs: Diminished throughout Abdomen: Soft, nontender, nondistended. No  hepatosplenomegaly. No bruits or masses. Good bowel sounds. Extremities: No cyanosis, clubbing, rash, edema; LUE PICC looks ok Neuro: Alert & oriented x 3, cranial nerves grossly intact. Moves all 4 extremities w/o difficulty. R sided hemiparesis. Affect pleasant.  ASSESSMENT & PLAN: 1. Chronic systolic CHF: Nonischemic cardiomyopathy known since 2018.  Improved initially in 2018 with DCCV, but has been back in atrial fibrillation since 2019 it appears and EF has been low. Admission 10/21 for a/c HF w/ low output requiring milrinone. Echo showed EF 15-20% with mod-severe RV dilation and moderate RV dysfunction, D-shaped septum, dilated IVC, severe biatrial enlargement. Significant RV failure with ascites on CT abdomen, had paracentesis.  Cardiac MRI showed LV EF 24%, RV mildly dilated with EF 42%, extensive LGE in a non-coronary distribution throughout much of the LV, also involving RV free wall and left and right atrial walls, ECV 42%. This was suggestive of infiltrative disease. 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  He was initially on milrinone for low output HF, this was weaned off for DCCV which he failed. RHC 11/11/19 off milrinone showed optimized filling pressures but low cardiac output, milrinone restarted and has been continued since that time. He was sent home in 11/21 on hospice but hospice has released him. Echo in 6/23 showed EF 25-30%, moderate RV dysfunction.  Echo 8/24 showed EF 25-30%, no LVH, moderate decreased RV systolic function, moderate MR, severe biatrial enlargement, IVC dilated. NYHA II, he does not appear volume overloaded on exam. - Continue home milrinone 0.125, he has done quite well with this.  No PICC line complications.    - Continue torsemide 30 mg daily  + 20 KCL daily.  Recent labs reviewed and are stable; K 4.5, creatinine 1.55 - Continue Farxiga 10 mg daily. No GU symptoms. - Continue spiro 25 mg daily  - BP too low for ARB/ARNi - No digoxin with history of bradycardia.   - Narrow QRS, not CRT candidate.  - Not a transplant candidate and not a good candidate for LVAD. Patient assessed for LVAD, we had significant concerns about his right arm/hand weakness.  He would be unable to manage his LVAD equipment and would need consistent 24 hr  caregiver (not available).       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  He has also been started on eplontersen with improvement in peripheral neuropathy symptoms.  2. Atrial fibrillation/flutter: Now permanent.  Initially in 2018, EF improved after DCCV.  However, as above, he has been relatively well rate controlled and has not been in RVR, so do not think that cardiomyopathy is primarily due to AF.  He failed DCCV on amiodarone 10/21, amiodarone stopped due to bradycardia. He remains in atypical flutter.  - Continue Eliquis.  3. Ascites: Suspect due to RV failure. S/p Paracentesis on 10/31 with 4.8 L out. No evidence of recurrence on exam today  4. H/o CVA: In 2010, with chronic expressive aphasia and right-sided weakness. Stable.  5. Hyperlipidemia: Lipids stable in 4/24.  6. Hypoglycemia: Spot checking blood sugars at home & having some lows. Appetite good. Needs to establish with PCP. Given list today to establish care.  - Would stop Tradjenta, but will defer to PCP  Follow up in 3-4 months with Dr. Park Liter, FNP 11/05/22

## 2022-11-05 NOTE — Patient Instructions (Addendum)
Thank you for coming in today  If you had labs drawn today, any labs that are abnormal the clinic will call you No news is good news  You were provided a Primary care list   Medications: No changes  Follow up appointments:  Your physician recommends that you schedule a follow-up appointment in:  3-4 months With Dr. Earlean Shawl will receive a reminder letter in the mail a few months in advance. If you don't receive a letter, please call our office to schedule the follow-up appointment.    Do the following things EVERYDAY: Weigh yourself in the morning before breakfast. Write it down and keep it in a log. Take your medicines as prescribed Eat low salt foods--Limit salt (sodium) to 2000 mg per day.  Stay as active as you can everyday Limit all fluids for the day to less than 2 liters   At the [facility], you and your health needs are our priority. As part of our continuing mission to provide you with exceptional heart care, we have created designated Provider Care Teams. These Care Teams include your primary Cardiologist (physician) and Advanced Practice Providers (APPs- Physician Assistants and Nurse Practitioners) who all work together to provide you with the care you need, when you need it.   You may see any of the following providers on your designated Care Team at your next follow up: Dr Arvilla Meres Dr Marca Ancona Dr. Marcos Eke, NP Robbie Lis, [place] [facility] [place], [place] Brynda Peon, NP Karle Plumber, PharmD   Please be sure to bring in all your medications bottles to every appointment.    Thank you for choosing [facility]  If you have any questions or concerns before your next appointment please send [name] a message through [place] or call our office at [phone].    TO LEAVE A MESSAGE FOR THE NURSE SELECT OPTION 2, PLEASE LEAVE A MESSAGE INCLUDING: YOUR NAME DATE OF  BIRTH CALL BACK NUMBER REASON FOR CALL**this is important as we prioritize the call backs  YOU WILL RECEIVE A CALL BACK THE SAME DAY AS LONG AS YOU CALL BEFORE 4:00 PM

## 2022-11-07 ENCOUNTER — Telehealth (HOSPITAL_COMMUNITY): Payer: Self-pay | Admitting: Cardiology

## 2022-11-07 DIAGNOSIS — I5022 Chronic systolic (congestive) heart failure: Secondary | ICD-10-CM

## 2022-11-07 NOTE — Telephone Encounter (Signed)
[name] [facility] [phone] Called to report picc line has ncehed out over the past few weeks  Currently out 5 cm  Blood was drawn and medication infusing however concerns for placement at IVC -recommends exchange   Please advised

## 2022-11-08 ENCOUNTER — Other Ambulatory Visit: Payer: Medicare Other | Admitting: Radiology

## 2022-11-08 ENCOUNTER — Ambulatory Visit (HOSPITAL_COMMUNITY)
Admission: RE | Admit: 2022-11-08 | Discharge: 2022-11-08 | Disposition: A | Discharge status: Home / Self Care | Payer: Medicare Other | Source: Ambulatory Visit | Attending: Family Medicine | Admitting: Family Medicine

## 2022-11-08 ENCOUNTER — Other Ambulatory Visit (HOSPITAL_COMMUNITY): Payer: Self-pay | Admitting: Family Medicine

## 2022-11-08 DIAGNOSIS — I5022 Chronic systolic (congestive) heart failure: Secondary | ICD-10-CM

## 2022-11-08 NOTE — Telephone Encounter (Signed)
PICC line replaced in IR today, [name] [facility] is aware.

## 2022-11-08 NOTE — Telephone Encounter (Signed)
Per Prince Rome, NP PICC should be exchanged, pt's niece is aware and agreeable however she is at work and unable to bring him, she request we call pts brother [name] who is often with pt and can provide transportation. Attempted to call [name] [phone]) and Left message to call back

## 2022-11-08 NOTE — Telephone Encounter (Signed)
Pt's brother aware and agreeable to bring pt for picc exchange, order placed, IR aware and will call [name] to schedule

## 2022-11-13 ENCOUNTER — Other Ambulatory Visit: Payer: Self-pay

## 2022-11-13 ENCOUNTER — Other Ambulatory Visit (HOSPITAL_COMMUNITY): Payer: Self-pay | Admitting: Cardiology

## 2022-11-14 ENCOUNTER — Telehealth (HOSPITAL_COMMUNITY): Payer: Self-pay

## 2022-11-14 NOTE — Telephone Encounter (Signed)
Milrinone orders faxed to [facility] on 11/14/22. Order was checked to make sure correct dose is ordered

## 2022-11-15 ENCOUNTER — Other Ambulatory Visit: Payer: Self-pay

## 2022-11-15 ENCOUNTER — Other Ambulatory Visit (HOSPITAL_COMMUNITY): Payer: Self-pay | Admitting: Cardiology

## 2022-11-15 NOTE — Progress Notes (Signed)
Specialty Pharmacy Refill Coordination Note  [name] is a 67 y.o. male contacted today regarding refills of specialty medication(s) Eplontersen Sodium   Patient requested Delivery   Delivery date: 11/21/22   Verified address: [address] [place]   Medication will be filled on 11/20/22 for 11/23/22 injection.

## 2022-11-20 ENCOUNTER — Other Ambulatory Visit: Payer: Self-pay

## 2022-12-10 ENCOUNTER — Other Ambulatory Visit (HOSPITAL_COMMUNITY): Payer: Self-pay

## 2022-12-11 ENCOUNTER — Other Ambulatory Visit (HOSPITAL_COMMUNITY): Payer: Self-pay | Admitting: Cardiology

## 2022-12-12 ENCOUNTER — Other Ambulatory Visit: Payer: Self-pay

## 2022-12-12 NOTE — Progress Notes (Signed)
Specialty Pharmacy Refill Coordination Note  [name] is a 67 y.o. male contacted today regarding refills of specialty medication(s) Eplontersen Sodium   Patient requested Delivery   Delivery date: 12/19/22   Verified address: [address] [place]   Medication will be filled on 12/18/22.

## 2022-12-17 ENCOUNTER — Other Ambulatory Visit: Payer: Self-pay

## 2022-12-17 ENCOUNTER — Other Ambulatory Visit (HOSPITAL_COMMUNITY): Payer: Self-pay | Admitting: Cardiology

## 2022-12-17 ENCOUNTER — Other Ambulatory Visit (HOSPITAL_COMMUNITY): Payer: Self-pay

## 2022-12-17 MED ORDER — VYNDAMAX 61 MG PO CAPS
1.0000 | ORAL_CAPSULE | Freq: Every day | ORAL | 3 refills | Status: DC
  Filled 2022-12-17: qty 90, 90d supply, fill #0
  Filled 2023-04-21: qty 90, 90d supply, fill #1
  Filled 2023-07-09: qty 90, 90d supply, fill #2

## 2022-12-17 NOTE — Progress Notes (Signed)
Specialty Pharmacy Refill Coordination Note  [name] is a 67 y.o. male contacted today regarding refills of specialty medication(s) Tafamidis Spoke to patient's niece  Patient requested Delivery   Delivery date: 12/31/22   Verified address: [address] [place]   Medication will be filled on 12/30/22.  Refill request pending.

## 2022-12-18 ENCOUNTER — Other Ambulatory Visit (HOSPITAL_COMMUNITY): Payer: Self-pay

## 2022-12-18 ENCOUNTER — Other Ambulatory Visit: Payer: Self-pay

## 2022-12-26 ENCOUNTER — Other Ambulatory Visit (HOSPITAL_COMMUNITY): Payer: Self-pay | Admitting: Cardiology

## 2022-12-30 ENCOUNTER — Other Ambulatory Visit: Payer: Self-pay

## 2023-01-06 ENCOUNTER — Ambulatory Visit (INDEPENDENT_AMBULATORY_CARE_PROVIDER_SITE_OTHER): Payer: Medicare Other | Admitting: Podiatry

## 2023-01-06 ENCOUNTER — Encounter: Payer: Self-pay | Admitting: Podiatry

## 2023-01-06 DIAGNOSIS — B351 Tinea unguium: Secondary | ICD-10-CM | POA: Diagnosis not present

## 2023-01-06 DIAGNOSIS — M79674 Pain in right toe(s): Secondary | ICD-10-CM

## 2023-01-06 DIAGNOSIS — M79675 Pain in left toe(s): Secondary | ICD-10-CM | POA: Diagnosis not present

## 2023-01-06 DIAGNOSIS — E1151 Type 2 diabetes mellitus with diabetic peripheral angiopathy without gangrene: Secondary | ICD-10-CM | POA: Diagnosis not present

## 2023-01-06 NOTE — Progress Notes (Signed)
This patient returns to my office for at risk foot care.  This patient requires this care by a professional since this patient will be at risk due to having type 2 diabetes.  This patient is unable to cut nails himself since the patient cannot reach his nails.These nails are painful walking and wearing shoes. He presents with male caregiver. This patient presents for at risk foot care today.  General Appearance  Alert, conversant and in no acute stress.  Vascular  Dorsalis pedis and posterior tibial  pulses are  weakly palpable  right.  Absent dorsalis pedis and posterior tibial pulses  left.  Capillary return is within normal limits  bilaterally. Temperature is within normal limits  bilaterally.  Neurologic  Senn-Weinstein monofilament wire test within normal limits  bilaterally. Muscle power within normal limits bilaterally.  Nails Thick disfigured discolored nails with subungual debris  from hallux to fifth toes bilaterally. No evidence of bacterial infection or drainage bilaterally.  Orthopedic  No limitations of motion  feet .  No crepitus or effusions noted.  No bony pathology or digital deformities noted.  Skin  normotropic skin with no porokeratosis noted bilaterally.  No signs of infections or ulcers noted.     Onychomycosis  Pain in right toes  Pain in left toes  Consent was obtained for treatment procedures.   Mechanical debridement of nails 1-5  bilaterally performed with a nail nipper.  Filed with dremel without incident.    Return office visit    3 months                  Told patient to return for periodic foot care and evaluation due to potential at risk complications.   Samari Bittinger DPM    

## 2023-01-07 ENCOUNTER — Other Ambulatory Visit (HOSPITAL_COMMUNITY): Payer: Self-pay

## 2023-01-07 ENCOUNTER — Encounter (HOSPITAL_COMMUNITY): Payer: Self-pay

## 2023-01-07 NOTE — Progress Notes (Signed)
 Specialty Pharmacy Refill Coordination Note  [name] is a 67 y.o. male contacted today regarding refills of specialty medication(s) Eplontersen  Sodium ([name] )   Patient requested Delivery   Delivery date: 01/17/23   Verified address: [address]  [place] [address]   Medication will be filled on 01/16/23.

## 2023-01-09 ENCOUNTER — Other Ambulatory Visit (HOSPITAL_COMMUNITY): Payer: Self-pay | Admitting: Cardiology

## 2023-01-16 ENCOUNTER — Other Ambulatory Visit: Payer: Self-pay

## 2023-01-21 ENCOUNTER — Other Ambulatory Visit (HOSPITAL_COMMUNITY): Payer: Self-pay | Admitting: Cardiology

## 2023-02-06 ENCOUNTER — Telehealth (HOSPITAL_COMMUNITY): Payer: Self-pay | Admitting: Cardiology

## 2023-02-06 DIAGNOSIS — I5022 Chronic systolic (congestive) heart failure: Secondary | ICD-10-CM

## 2023-02-06 NOTE — Telephone Encounter (Signed)
Vernona Rieger RN with [facility] Left VM on triage line with reports PICC line is out x 9 cm (has been out x 3 cm)  Reports when pt has gone to replace in the past, was told he needed to bring his own picc line and his dislodged line was "pushed back in"    Please advise

## 2023-02-06 NOTE — Telephone Encounter (Signed)
Please set up time to replace PICC.  Needs to be replaced. ASAP.   Tuwana Kapaun NP-C  4:58 PM

## 2023-02-07 NOTE — Addendum Note (Signed)
Addended by: Theresia Bough on: 02/07/2023 11:15 AM   Modules accepted: Orders

## 2023-02-07 NOTE — Telephone Encounter (Signed)
Niece aware line needs to be replaced  Please contact brother [name]) with appt details as he will be assisting with appt.    IR [phone] NO ANSWER  IR [phone] TRANSFERRED BACK TO [phone] LMOM

## 2023-02-11 NOTE — Telephone Encounter (Signed)
Pt is overdue for follow up per medicare guideline with milrinone  -will send to scheduling to arrange (please contact niece to schedule per brother)

## 2023-02-11 NOTE — Telephone Encounter (Signed)
Pt aware of PICC line replacement via brother

## 2023-02-12 ENCOUNTER — Ambulatory Visit (HOSPITAL_COMMUNITY)
Admission: RE | Admit: 2023-02-12 | Discharge: 2023-02-12 | Disposition: A | Discharge status: Home / Self Care | Payer: Medicare Other | Source: Ambulatory Visit | Attending: Adult Health | Admitting: Adult Health

## 2023-02-12 ENCOUNTER — Other Ambulatory Visit: Payer: Self-pay

## 2023-02-12 DIAGNOSIS — T829XXA Unspecified complication of cardiac and vascular prosthetic device, implant and graft, initial encounter: Secondary | ICD-10-CM | POA: Insufficient documentation

## 2023-02-12 DIAGNOSIS — I5022 Chronic systolic (congestive) heart failure: Secondary | ICD-10-CM | POA: Insufficient documentation

## 2023-02-12 MED ORDER — HEPARIN SOD (PORK) LOCK FLUSH 100 UNIT/ML IV SOLN
500.0000 [IU] | Freq: Once | INTRAVENOUS | Status: AC
  Administered 2023-02-12: 500 [IU] via INTRAVENOUS

## 2023-02-12 MED ORDER — HEPARIN SOD (PORK) LOCK FLUSH 100 UNIT/ML IV SOLN
INTRAVENOUS | Status: AC
  Filled 2023-02-12: qty 5

## 2023-02-12 MED ORDER — CHLORHEXIDINE GLUCONATE 4 % EX SOLN
CUTANEOUS | Status: AC
  Filled 2023-02-12: qty 15

## 2023-02-12 NOTE — Procedures (Signed)
 PROCEDURE SUMMARY:  Successful placement of double lumen PICC line to left basilic vein. Length 40 cm Tip at lower SVC/RA No complications PICC capped Ready for use. EBL = trace  Please see full dictation in Imaging section for details.   Electronically Signed: Carlin DELENA Griffon, PA-C 02/12/2023, 12:49 PM

## 2023-02-13 ENCOUNTER — Other Ambulatory Visit: Payer: Self-pay

## 2023-02-13 ENCOUNTER — Other Ambulatory Visit (HOSPITAL_COMMUNITY): Payer: Self-pay | Admitting: Cardiology

## 2023-02-14 ENCOUNTER — Other Ambulatory Visit: Payer: Self-pay

## 2023-02-14 NOTE — Progress Notes (Signed)
 Specialty Pharmacy Refill Coordination Note  Handy Mcloud is a 68 y.o. male contacted today regarding refills of specialty medication(s) Eplontersen  Sodium ([name] )   Patient requested Delivery   Delivery date: 02/19/23   Verified address: [address]  [place] [address]   Medication will be filled on 02.11.25.

## 2023-02-14 NOTE — Progress Notes (Signed)
 Specialty Pharmacy Ongoing Clinical Assessment Note  [name] is a 68 y.o. male who is being followed by the specialty pharmacy service for RxSp Cardiology   Patient's specialty medication(s) reviewed today: Eplontersen  Sodium ([name] )   Missed doses in the last 4 weeks: 0   Patient/Caregiver did not have any additional questions or concerns.   Therapeutic benefit summary: Unable to assess   Adverse events/side effects summary: No adverse events/side effects   Patient's therapy is appropriate to: Continue    Goals Addressed             This Visit's Progress    Stabilization of disease       Patient is on track. Patient will maintain adherence         Follow up:  6 months  Mitzie GORMAN [facility] Specialty Pharmacist

## 2023-02-18 ENCOUNTER — Other Ambulatory Visit: Payer: Self-pay

## 2023-03-04 ENCOUNTER — Telehealth (HOSPITAL_COMMUNITY): Payer: Self-pay

## 2023-03-04 NOTE — Telephone Encounter (Signed)
 Called and left patient a voice message to confirm/remind patient of their appointment at the [facility] on 03/05/23.   And to bring in all medications and/or complete list.

## 2023-03-05 ENCOUNTER — Encounter (HOSPITAL_COMMUNITY): Payer: Self-pay

## 2023-03-05 ENCOUNTER — Ambulatory Visit (HOSPITAL_COMMUNITY)
Admission: RE | Admit: 2023-03-05 | Discharge: 2023-03-05 | Disposition: A | Discharge status: Home / Self Care | Payer: Medicare Other | Source: Ambulatory Visit | Attending: Family Medicine | Admitting: Family Medicine

## 2023-03-05 VITALS — BP 92/64 | HR 80 | Wt 134.2 lb

## 2023-03-05 DIAGNOSIS — Z87898 Personal history of other specified conditions: Secondary | ICD-10-CM

## 2023-03-05 DIAGNOSIS — Z7901 Long term (current) use of anticoagulants: Secondary | ICD-10-CM | POA: Diagnosis not present

## 2023-03-05 DIAGNOSIS — I69351 Hemiplegia and hemiparesis following cerebral infarction affecting right dominant side: Secondary | ICD-10-CM | POA: Insufficient documentation

## 2023-03-05 DIAGNOSIS — I69320 Aphasia following cerebral infarction: Secondary | ICD-10-CM | POA: Diagnosis not present

## 2023-03-05 DIAGNOSIS — I4821 Permanent atrial fibrillation: Secondary | ICD-10-CM | POA: Diagnosis not present

## 2023-03-05 DIAGNOSIS — I4892 Unspecified atrial flutter: Secondary | ICD-10-CM | POA: Diagnosis not present

## 2023-03-05 DIAGNOSIS — E785 Hyperlipidemia, unspecified: Secondary | ICD-10-CM | POA: Diagnosis not present

## 2023-03-05 DIAGNOSIS — I482 Chronic atrial fibrillation, unspecified: Secondary | ICD-10-CM | POA: Diagnosis present

## 2023-03-05 DIAGNOSIS — R188 Other ascites: Secondary | ICD-10-CM | POA: Insufficient documentation

## 2023-03-05 DIAGNOSIS — I428 Other cardiomyopathies: Secondary | ICD-10-CM | POA: Diagnosis present

## 2023-03-05 DIAGNOSIS — I5022 Chronic systolic (congestive) heart failure: Secondary | ICD-10-CM | POA: Insufficient documentation

## 2023-03-05 DIAGNOSIS — Z79899 Other long term (current) drug therapy: Secondary | ICD-10-CM | POA: Insufficient documentation

## 2023-03-05 NOTE — Progress Notes (Signed)
 [facility] Note   PCP: Dr. Cain Saupe ([facility]) Primary Cardiologist: Verne Carrow, MD  HF Cardiology: Dr. Shirlee Latch   HPI:  68 y.o. with history of chronic atrial fibrillation and end-stage nonischemic cardiomyopathy.  Patient had CVA in 2010, since that time has had expressive aphasia and right-sided weakness.  He lives with his brother.  In 8/18, he was admited with new atrial flutter and EF was found to be 20%.  He had cath in 8/18 showing mild nonobstructive coronary disease.  In 10/18, he had DCCV to NSR.  In 11/18 while in NSR, echo showed EF up to 45-50%.  By 2019, he was back in atrial fibrillation.  He was not cardioverted again. Echo in 4/21 showed EF 20-25%.     Patient was admitted in 10/21 with h/o increased dyspnea with exertion and developed abdominal swelling.  Failed outpatient diuretic titration. Admitted for IV diuretics. Echo repeated and EF was 20% with mod-severe RV dilation and moderate RV dysfunction, D-shaped septum, dilated IVC, severe biatrial enlargement.  He was in atrial fibrillation in 50s-60s. CT of abdomen showed large volume ascites, had paracentesis with 4.8 L out.  Cardiac MRI showed LV EF 24%, RV mildly dilated with EF 42%, extensive LGE in a non-coronary distribution throughout much of the LV, also involving RV free wall and left and right atrial walls, ECV 42%. This was suggestive of infiltrative disease. 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  He was initially on milrinone for low output HF, this was weaned off for DCCV which he failed. RHC 11/11/19 off milrinone showed optimized filling pressures but low cardiac output, milrinone restarted at 0.125. Discharged w/ home milrinone + home health.  He remained in atrial fibrillation.  He was not thought to be  a candidate for LVAD.   Echo in 6/22 showd EF 25-30%, mild LV dilation, mild LVH, moderately decreased RV systolic function, IVC dilated, PASP 49 mmHg.  Echo in 6/23 showed EF 25-30%, global hypokinesis, mild LV dilation, moderately decreased RV systolic function, severe biatrial enlargement, mild MR.   Echo 8/24 EF 25-30%, no LVH, moderate decreased RV systolic function, moderate MR, severe biatrial enlargement, IVC dilated.   Today he returns for end-stage HF follow up with his niece. Niece provides most of history due to patient's aphasia. Overall feeling fine. Has knee OA and walks with a cane. Denies increasing SOB, palpitations, abnormal bleeding, CP, dizziness, edema, or PND/Orthopnea. Appetite ok. No fever or chills. Weight at home 134 pounds. Taking all medications. No issues with milrinone infusion or pump.  ECG (personally reviewed): atrial flutter 66 bpm  Labs (2/24): hgb 13.5, creatinine 1.43 Labs (4/24): LDL 81 Labs (7/24): hgb 13.5, plts 139, K 4, creatinine 1.38 Labs (10/24): K 4.5, creatinine 1.55, hgb 13.3, plts 164 Labs (1/25): K 4.9, creatinine 1.51  Review of systems complete and found to be negative unless listed in HPI.    PMH: 1. 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  2. DM2 3. Atrial fibrillation: Chronic. Has failed DCCV and amiodarone.  4. Chronic systolic CHF: End-stage nonischemic cardiomyopathy.   - LHC (8/18): Mild nonobstructive CAD.  - RHC (11/21): mean RA 7, PA 29/9, mean PCWP 8, CI 1.6 (thermodilution)/2.5 (Fick) - Echo (11/21): EF 15-20%, D-shaped interventricular septum, severe dilated and severely dysfunctional RV, severe biatrial enlargement.  -  Cardiac MRI (11/21): LV EF 24%, RV mildly dilated with EF 42%, extensive LGE in a non-coronary distribution throughout much of the LV, also involving RV free wall and left and right atrial walls, ECV 42%. This was suggestive of infiltrative disease. 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  - milrinone dependent - Echo (6/22): EF 25-30%, mild LV dilation, mild LVH, moderately decreased RV systolic function, IVC dilated, PASP 49 mmHg. - Echo (6/23): EF 25-30%, mild LV dilation, mild LVH, moderately decreased RV systolic function, IVC dilated, PASP 49 mmHg.  Echo in 6/23 showed EF 25-30%, global hypokinesis, mild LV dilation, moderately decreased RV systolic function, severe biatrial enlargement, mild MR.  - Echo (8/24): EF 25-30%, no LVH, moderate decreased RV systolic function, moderate MR, severe biatrial enlargement, IVC dilated.    Current Outpatient Medications  Medication Sig Dispense Refill   Accu-Chek FastClix Lancets MISC 1 each by Other route as directed.     ACCU-CHEK GUIDE test strip 1 each by Other route See admin instructions.     acetaminophen (TYLENOL) 500 MG tablet Take 1,000 mg by mouth every 6 (six) hours as needed for pain.     dapagliflozin propanediol (FARXIGA) 10 MG TABS tablet Take 1 tablet (10 mg total) by mouth daily before breakfast. 90 tablet 3   ELIQUIS 5 MG TABS tablet TAKE 1 TABLET(5 MG) BY MOUTH TWICE DAILY 180 tablet 2   Eplontersen Sodium ([name]) 45 MG/0.8ML SOAJ Inject 45 mg into the skin every 30 (thirty) days. 0.8 mL 11   Loratadine (CLARITIN PO) Take 10 mg by mouth as needed.     milrinone (PRIMACOR) 20 MG/100 ML SOLN infusion Inject 0.0139 mg/min into the vein continuous. 100 mL 0   omega-3 acid ethyl esters (LOVAZA) 1 G capsule Take 1 g by mouth daily.     Omega-3 Fatty Acids (FISH OIL) 1000 MG CAPS Take 1 capsule by mouth daily.     simvastatin (ZOCOR) 40 MG tablet Take 40 mg by mouth daily.     spironolactone (ALDACTONE) 25 MG tablet Take 1 tablet (25 mg total) by mouth daily. 90 tablet 3   Tafamidis (VYNDAMAX) 61 MG CAPS Take 1 capsule by mouth daily. 90 capsule 3   torsemide (DEMADEX) 20 MG tablet Take 1.5 tablets (30 mg total) by mouth daily. 200  tablet 3   TRADJENTA 5 MG TABS tablet Take 5 mg by mouth every morning.     potassium chloride (KLOR-CON) 10 MEQ tablet Take 2 tablets (20 mEq total) by mouth daily. 180 tablet 3   No current facility-administered medications for this encounter.    No Known Allergies    Social History   Socioeconomic History   Marital status: Single    Spouse name: Not on file   Number of children: Not on file   Years of education: Not on file   Highest education level: Not on file  Occupational History   Not on file  Tobacco Use   Smoking status: Never   Smokeless tobacco: Never  Vaping Use   Vaping status: Never Used  Substance and Sexual Activity   Alcohol use: No   Drug use: No   Sexual activity: Not Currently  Other Topics Concern   Not on file  Social History Narrative   Not on file   Social Drivers of Health   Financial Resource Strain: Not on file  Food Insecurity: Not on file  Transportation Needs: Not on file  Physical Activity: Not on file  Stress: Not on file  Social Connections: Not on file  Intimate Partner Violence: Not on file   Family History  Problem Relation Age of Onset   Hypertension Mother    Diabetes Mother    Heart disease Father    BP 92/64   Pulse 80   Wt 60.9 kg (134 lb 3.2 oz)   SpO2 100%   BMI 21.02 kg/m   Wt Readings from Last 3 Encounters:  03/05/23 60.9 kg (134 lb 3.2 oz)  11/05/22 60.1 kg (132 lb 9.6 oz)  08/23/22 60.8 kg (134 lb)   PHYSICAL EXAM: General:  NAD. No resp difficulty, walked into clinic with cane HEENT: Normal Neck: Supple. No JVD. Cor: Irregular rate & rhythm. No rubs, gallops or murmurs. Lungs: Clear Abdomen: Soft, nontender, nondistended.  Extremities: No cyanosis, clubbing, rash, edema; LUE PICC site looks OK Neuro: Alert & oriented x 3, R arm/hand weakness. Affect pleasant. + aphasia  ASSESSMENT & PLAN: 1. Chronic systolic CHF: Nonischemic cardiomyopathy known since 2018.  Improved initially in 2018 with DCCV,  but has been back in atrial fibrillation since 2019 it appears and EF has been low. Admission 10/21 for a/c HF w/ low output requiring milrinone. Echo showed EF 15-20% with mod-severe RV dilation and moderate RV dysfunction, D-shaped septum, dilated IVC, severe biatrial enlargement. Significant RV failure with ascites on CT abdomen, had paracentesis.  Cardiac MRI showed LV EF 24%, RV mildly dilated with EF 42%, extensive LGE in a non-coronary distribution throughout much of the LV, also involving RV free wall and left and right atrial walls, ECV 42%. This was suggestive of infiltrative disease. 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  He was initially on milrinone for low output HF, this was weaned off for DCCV which he failed. RHC 11/11/19 off milrinone showed optimized filling pressures but low cardiac output, milrinone restarted and has been continued since that time. He was sent home in 11/21 on hospice but hospice released him. Echo in 6/23 showed EF 25-30%, moderate RV dysfunction.  Echo 8/24 showed EF 25-30%, no LVH, moderate decreased RV systolic function, moderate MR, severe biatrial enlargement, IVC dilated. NYHA II, he does not appear volume overloaded on exam. - Continue home milrinone 0.125, he has done quite well with this.  No PICC line complications.    - Continue torsemide 30 mg daily + 20 KCL daily. He is due for labs with HH, via PICC line.  - Continue Farxiga 10 mg daily. No GU symptoms. - Continue spiro 25 mg daily  - BP too low for ARB/ARNi - No digoxin with history of bradycardia.   - Narrow QRS, not CRT candidate.  - Not a transplant candidate and not a good candidate for LVAD. Patient assessed for LVAD, we had significant concerns about his right arm/hand weakness.   He would be unable to manage his LVAD equipment and would need consistent 24 hr caregiver (not available).       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  He has also been started on eplontersen with improvement in peripheral neuropathy symptoms.  2. Atrial fibrillation/flutter: Now permanent.  Initially in 2018, EF improved after DCCV.  However, as above, he has  been relatively well rate controlled and has not been in RVR, so do not think that cardiomyopathy is primarily due to AF.  He failed DCCV on amiodarone 10/21, amiodarone stopped due to bradycardia. 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  - Continue Eliquis.  3. Ascites: Suspect due to RV failure. S/p Paracentesis on 10/31 with 4.8 L out.  - Stable without evidence of recurrence on exam today  4. H/o CVA: In 2010, with chronic expressive aphasia and right-sided weakness.  - Stable, no change.  5. Hyperlipidemia: Lipids stable in 4/24. Will have HH RN get lipids at next lab draw.  Follow up in 3-4 months with Dr. Shirlee Latch.  Anderson Malta Winfield, FNP 03/05/23

## 2023-03-05 NOTE — Patient Instructions (Addendum)
 Thank you for coming in today  If you had labs drawn today, any labs that are abnormal the clinic will call you No news is good news  Medications: No changes  Follow up appointments:  Your physician recommends that you schedule a follow-up appointment in:  3 months With Dr. Shirlee Latch  Your physician recommends that you return for lab work in:  You were given a Prescription for labs to give to [facility] nurse. And it was faxed over to [facility]    Do the following things EVERYDAY: Weigh yourself in the morning before breakfast. Write it down and keep it in a log. Take your medicines as prescribed Eat low salt foods--Limit salt (sodium) to 2000 mg per day.  Stay as active as you can everyday Limit all fluids for the day to less than 2 liters   At the [facility], you and your health needs are our priority. As part of our continuing mission to provide you with exceptional heart care, we have created designated Provider Care Teams. These Care Teams include your primary Cardiologist (physician) and Advanced Practice Providers (APPs- Physician Assistants and Nurse Practitioners) who all work together to provide you with the care you need, when you need it.   You may see any of the following providers on your designated Care Team at your next follow up: Dr Arvilla Meres Dr Marca Ancona Dr. Marcos Eke, NP Robbie Lis, [place] [facility] [place], [place] Brynda Peon, NP Karle Plumber, PharmD   Please be sure to bring in all your medications bottles to every appointment.    Thank you for choosing [facility]  If you have any questions or concerns before your next appointment please send [name] a message through [place] or call our office at [phone].    TO LEAVE A MESSAGE FOR THE NURSE SELECT OPTION 2, PLEASE LEAVE A MESSAGE INCLUDING: YOUR NAME DATE OF BIRTH CALL BACK NUMBER REASON FOR CALL**this  is important as we prioritize the call backs  YOU WILL RECEIVE A CALL BACK THE SAME DAY AS LONG AS YOU CALL BEFORE 4:00 PM

## 2023-03-05 NOTE — Progress Notes (Signed)
 Patient and patient daughter given a Prescription for labs to give to homehealth nurse when she comes tomorrow. Order was faxed to [facility] [phone]

## 2023-03-12 ENCOUNTER — Other Ambulatory Visit (HOSPITAL_COMMUNITY): Payer: Self-pay | Admitting: Cardiology

## 2023-03-13 ENCOUNTER — Other Ambulatory Visit: Payer: Self-pay

## 2023-03-17 ENCOUNTER — Other Ambulatory Visit (HOSPITAL_COMMUNITY): Payer: Self-pay

## 2023-03-19 ENCOUNTER — Other Ambulatory Visit (HOSPITAL_COMMUNITY): Payer: Self-pay

## 2023-04-07 ENCOUNTER — Ambulatory Visit (INDEPENDENT_AMBULATORY_CARE_PROVIDER_SITE_OTHER): Payer: Medicare Other | Admitting: Podiatry

## 2023-04-07 ENCOUNTER — Other Ambulatory Visit (HOSPITAL_COMMUNITY): Payer: Self-pay | Admitting: Cardiology

## 2023-04-07 ENCOUNTER — Encounter: Payer: Self-pay | Admitting: Podiatry

## 2023-04-07 DIAGNOSIS — B351 Tinea unguium: Secondary | ICD-10-CM | POA: Diagnosis not present

## 2023-04-07 DIAGNOSIS — E1151 Type 2 diabetes mellitus with diabetic peripheral angiopathy without gangrene: Secondary | ICD-10-CM

## 2023-04-07 DIAGNOSIS — M79675 Pain in left toe(s): Secondary | ICD-10-CM | POA: Diagnosis not present

## 2023-04-07 DIAGNOSIS — M79674 Pain in right toe(s): Secondary | ICD-10-CM

## 2023-04-07 NOTE — Progress Notes (Signed)
This patient returns to my office for at risk foot care.  This patient requires this care by a professional since this patient will be at risk due to having type 2 diabetes.  This patient is unable to cut nails himself since the patient cannot reach his nails.These nails are painful walking and wearing shoes. He presents with male caregiver. This patient presents for at risk foot care today.  General Appearance  Alert, conversant and in no acute stress.  Vascular  Dorsalis pedis and posterior tibial  pulses are  weakly palpable  right.  Absent dorsalis pedis and posterior tibial pulses  left.  Capillary return is within normal limits  bilaterally. Temperature is within normal limits  bilaterally.  Neurologic  Senn-Weinstein monofilament wire test within normal limits  bilaterally. Muscle power within normal limits bilaterally.  Nails Thick disfigured discolored nails with subungual debris  from hallux to fifth toes bilaterally. No evidence of bacterial infection or drainage bilaterally.  Orthopedic  No limitations of motion  feet .  No crepitus or effusions noted.  No bony pathology or digital deformities noted.  Skin  normotropic skin with no porokeratosis noted bilaterally.  No signs of infections or ulcers noted.     Onychomycosis  Pain in right toes  Pain in left toes  Consent was obtained for treatment procedures.   Mechanical debridement of nails 1-5  bilaterally performed with a nail nipper.  Filed with dremel without incident.    Return office visit    3 months                  Told patient to return for periodic foot care and evaluation due to potential at risk complications.   Samari Bittinger DPM    

## 2023-04-16 ENCOUNTER — Other Ambulatory Visit (HOSPITAL_COMMUNITY): Payer: Self-pay | Admitting: Cardiology

## 2023-04-19 ENCOUNTER — Other Ambulatory Visit (HOSPITAL_COMMUNITY): Payer: Self-pay

## 2023-04-21 ENCOUNTER — Other Ambulatory Visit (HOSPITAL_COMMUNITY): Payer: Self-pay | Admitting: Cardiology

## 2023-04-21 ENCOUNTER — Other Ambulatory Visit (HOSPITAL_COMMUNITY): Payer: Self-pay

## 2023-04-21 ENCOUNTER — Other Ambulatory Visit: Payer: Self-pay

## 2023-04-21 NOTE — Progress Notes (Signed)
 Specialty Pharmacy Refill Coordination Note  [name] is a 68 y.o. male contacted today regarding refills of specialty medication(s) Eplontersen Sodium [name]); Tafamidis (Vyndamax)  Spoke with patient's niece  Patient requested Pickup at [facility] [place] date: 04/24/23   Medication will be filled on 04.16.25.   Refill request pending for wainua.

## 2023-04-22 ENCOUNTER — Other Ambulatory Visit (HOSPITAL_COMMUNITY): Payer: Self-pay | Admitting: Cardiology

## 2023-04-22 ENCOUNTER — Other Ambulatory Visit (HOSPITAL_COMMUNITY): Payer: Self-pay

## 2023-04-22 ENCOUNTER — Other Ambulatory Visit: Payer: Self-pay

## 2023-04-22 MED ORDER — WAINUA 45 MG/0.8ML ~~LOC~~ SOAJ
45.0000 mg | SUBCUTANEOUS | 11 refills | Status: DC
  Filled 2023-04-22: qty 0.8, 30d supply, fill #0
  Filled 2023-05-13 (×2): qty 0.8, 30d supply, fill #1
  Filled 2023-06-13: qty 0.8, 30d supply, fill #2
  Filled 2023-07-14: qty 0.8, 30d supply, fill #3
  Filled 2023-08-07: qty 0.8, 30d supply, fill #4
  Filled 2023-09-15 (×3): qty 0.8, 30d supply, fill #5

## 2023-04-23 ENCOUNTER — Other Ambulatory Visit: Payer: Self-pay

## 2023-04-23 ENCOUNTER — Other Ambulatory Visit (HOSPITAL_COMMUNITY): Payer: Self-pay

## 2023-05-05 ENCOUNTER — Other Ambulatory Visit (HOSPITAL_COMMUNITY): Payer: Self-pay | Admitting: Cardiology

## 2023-05-12 ENCOUNTER — Other Ambulatory Visit (HOSPITAL_COMMUNITY): Payer: Self-pay | Admitting: Cardiology

## 2023-05-13 ENCOUNTER — Other Ambulatory Visit (HOSPITAL_COMMUNITY): Payer: Self-pay

## 2023-05-13 ENCOUNTER — Other Ambulatory Visit: Payer: Self-pay

## 2023-05-13 NOTE — Progress Notes (Signed)
 Specialty Pharmacy Refill Coordination Note  [name] is a 68 y.o. male contacted today regarding refills of specialty medication(s) [name] .  Patient requested (Patient-Rptd) Delivery   Delivery date: (Patient-Rptd) 05/16/23   Verified address: (Patient-Rptd) [address] [place], [place]  [phone]   Medication will be filled on 05/15/23.

## 2023-05-17 ENCOUNTER — Other Ambulatory Visit (HOSPITAL_COMMUNITY): Payer: Self-pay | Admitting: Cardiology

## 2023-05-27 ENCOUNTER — Other Ambulatory Visit (HOSPITAL_COMMUNITY): Payer: Self-pay | Admitting: Cardiology

## 2023-06-06 ENCOUNTER — Telehealth (HOSPITAL_COMMUNITY): Payer: Self-pay | Admitting: Cardiology

## 2023-06-06 ENCOUNTER — Other Ambulatory Visit (HOSPITAL_COMMUNITY): Payer: Self-pay | Admitting: Cardiology

## 2023-06-06 NOTE — Telephone Encounter (Signed)
 Left message to call back to schedule f/u appt.

## 2023-06-13 ENCOUNTER — Other Ambulatory Visit: Payer: Self-pay

## 2023-06-13 NOTE — Progress Notes (Signed)
 Specialty Pharmacy Refill Coordination Note  [name] is a 68 y.o. male contacted today regarding refills of specialty medication(s) Eplontersen  Sodium ([name] )   Patient requested Delivery   Delivery date: 06/19/23   Verified address: [address] [name], [name]  [address]   Medication will be filled on 06/18/23.

## 2023-06-18 ENCOUNTER — Other Ambulatory Visit: Payer: Self-pay

## 2023-06-20 ENCOUNTER — Other Ambulatory Visit (HOSPITAL_COMMUNITY): Payer: Self-pay | Admitting: Cardiology

## 2023-07-07 ENCOUNTER — Other Ambulatory Visit (HOSPITAL_COMMUNITY): Payer: Self-pay

## 2023-07-07 NOTE — Progress Notes (Signed)
 Advanced Heart Failure Clinic Note   PCP: Dr. Lannie Purdue ([facility]) Primary Cardiologist: Lonni Cash, MD  HF Cardiology: Dr. Rolan   HPI:  68 y.o. with history of chronic atrial fibrillation and end-stage nonischemic cardiomyopathy.  Patient had CVA in 2010, since that time has had expressive aphasia and right-sided weakness.  He lives with his brother.  In 8/18, he was admited with new atrial flutter and EF was found to be 20%.  He had cath in 8/18 showing mild nonobstructive coronary disease.  In 10/18, he had DCCV to NSR.  In 11/18 while in NSR, echo showed EF up to 45-50%.  By 2019, he was back in atrial fibrillation.  He was not cardioverted again. Echo in 4/21 showed EF 20-25%.     Patient was admitted in 10/21 with h/o increased dyspnea with exertion and developed abdominal swelling.  Failed outpatient diuretic titration. Admitted for IV diuretics. Echo repeated and EF was 20% with mod-severe RV dilation and moderate RV dysfunction, D-shaped septum, dilated IVC, severe biatrial enlargement.  He was in atrial fibrillation in 50s-60s. CT of abdomen showed large volume ascites, had paracentesis with 4.8 L out.  Cardiac MRI showed LV EF 24%, RV mildly dilated with EF 42%, extensive LGE in a non-coronary distribution throughout much of the LV, also involving RV free wall and left and right atrial walls, ECV 42%. This was suggestive of infiltrative disease. 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  He was initially on milrinone  for low output HF, this was weaned off for DCCV which he failed. RHC 11/11/19 off milrinone  showed optimized filling pressures but low cardiac output, milrinone  restarted at 0.125. Discharged w/ home milrinone  + home health.  He remained in atrial fibrillation.  He was not thought to be  a candidate for LVAD.   Echo in 6/22 showd EF 25-30%, mild LV dilation, mild LVH, moderately decreased RV systolic function, IVC dilated, PASP 49 mmHg.  Echo in 6/23 showed EF 25-30%, global hypokinesis, mild LV dilation, moderately decreased RV systolic function, severe biatrial enlargement, mild MR.   Echo 8/24 EF 25-30%, no LVH, moderate decreased RV systolic function, moderate MR, severe biatrial enlargement, IVC dilated.   Today he returns for end-stage HF follow up with his niece. Niece provides most of history due to patient's aphasia. Overall feeling fine. Has knee OA and walks with a cane. Denies increasing SOB, palpitations, abnormal bleeding, CP, dizziness, edema, or PND/Orthopnea. Appetite ok. No fever or chills. Weight at home 134 pounds. Taking all medications. No issues with milrinone  infusion or pump.  ECG (personally reviewed): atrial flutter 66 bpm  Labs (2/24): hgb 13.5, creatinine 1.43 Labs (4/24): LDL 81 Labs (7/24): hgb 13.5, plts 139, K 4, creatinine 1.38 Labs (10/24): K 4.5, creatinine 1.55, hgb 13.3, plts 164 Labs (1/25): K 4.9, creatinine 1.51  Review of systems complete and found to be negative unless listed in HPI.    PMH: 1. 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  2. DM2 3. Atrial fibrillation: Chronic. Has failed DCCV and amiodarone .  4. Chronic systolic CHF: End-stage nonischemic cardiomyopathy.   - LHC (8/18): Mild nonobstructive CAD.  - RHC (11/21): mean RA 7, PA 29/9, mean PCWP 8, CI 1.6 (thermodilution)/2.5 (Fick) - Echo (11/21): EF 15-20%, D-shaped interventricular septum, severe dilated and severely dysfunctional RV, severe biatrial enlargement.  -  Cardiac MRI (11/21): LV EF 24%, RV mildly dilated with EF 42%, extensive LGE in a non-coronary distribution throughout much of the LV, also involving RV free wall and left and right atrial walls, ECV 42%. This was suggestive of infiltrative disease. 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  - milrinone  dependent - Echo (6/22): EF 25-30%, mild LV dilation, mild LVH, moderately decreased RV systolic function, IVC dilated, PASP 49 mmHg. - Echo (6/23): EF 25-30%, mild LV dilation, mild LVH, moderately decreased RV systolic function, IVC dilated, PASP 49 mmHg.  Echo in 6/23 showed EF 25-30%, global hypokinesis, mild LV dilation, moderately decreased RV systolic function, severe biatrial enlargement, mild MR.  - Echo (8/24): EF 25-30%, no LVH, moderate decreased RV systolic function, moderate MR, severe biatrial enlargement, IVC dilated.    Current Outpatient Medications  Medication Sig Dispense Refill   Accu-Chek FastClix Lancets MISC 1 each by Other route as directed.     ACCU-CHEK GUIDE test strip 1 each by Other route See admin instructions.     acetaminophen  (TYLENOL ) 500 MG tablet Take 1,000 mg by mouth every 6 (six) hours as needed for pain.     ELIQUIS  5 MG TABS tablet TAKE 1 TABLET(5 MG) BY MOUTH TWICE DAILY 180 tablet 2   Eplontersen  Sodium ([name] ) 45 MG/0.8ML SOAJ Inject 45 mg into the skin every 30 (thirty) days. 0.8 mL 11   FARXIGA  10 MG TABS tablet TAKE 1 TABLET(10 MG) BY MOUTH DAILY BEFORE BREAKFAST 90 tablet 3   Loratadine  (CLARITIN  PO) Take 10 mg by mouth as needed.     milrinone  (PRIMACOR ) 20 MG/100 ML SOLN infusion Inject 0.0139 mg/min into the vein continuous. 100 mL 0   omega-3 acid ethyl esters (LOVAZA ) 1 G capsule Take 1 g by mouth daily.     Omega-3 Fatty Acids (FISH OIL) 1000 MG CAPS Take 1 capsule by mouth daily.     potassium chloride  (KLOR-CON ) 10 MEQ tablet Take 2 tablets (20 mEq total) by mouth daily. 180 tablet 3   simvastatin  (ZOCOR ) 40 MG tablet Take 40 mg by mouth daily.     spironolactone  (ALDACTONE ) 25 MG tablet Take 1 tablet (25 mg total) by mouth daily. 90 tablet 3   Tafamidis  (VYNDAMAX ) 61 MG CAPS Take 1 capsule by mouth daily. 90 capsule 3   torsemide   (DEMADEX ) 20 MG tablet Take 1.5 tablets (30 mg total) by mouth daily. 200 tablet 3   TRADJENTA 5 MG TABS tablet Take 5 mg by mouth every morning.     No current facility-administered medications for this visit.    No Known Allergies    Social History   Socioeconomic History   Marital status: Single    Spouse name: Not on file   Number of children: Not on file   Years of education: Not on file   Highest education level: Not on file  Occupational History   Not on file  Tobacco Use   Smoking status: Never   Smokeless tobacco: Never  Vaping Use   Vaping status: Never Used  Substance and Sexual Activity   Alcohol use: No   Drug use: No   Sexual activity: Not Currently  Other Topics Concern   Not on file  Social History Narrative   Not on file   Social Drivers of Health   Financial Resource Strain: Not on file  Food Insecurity: Not on file  Transportation Needs: Not  on file  Physical Activity: Not on file  Stress: Not on file  Social Connections: Not on file  Intimate Partner Violence: Not on file   Family History  Problem Relation Age of Onset   Hypertension Mother    Diabetes Mother    Heart disease Father    There were no vitals taken for this visit.  Wt Readings from Last 3 Encounters:  03/05/23 60.9 kg (134 lb 3.2 oz)  11/05/22 60.1 kg (132 lb 9.6 oz)  08/23/22 60.8 kg (134 lb)   PHYSICAL EXAM: General:  NAD. No resp difficulty, walked into clinic with cane HEENT: Normal Neck: Supple. No JVD. Cor: Irregular rate & rhythm. No rubs, gallops or murmurs. Lungs: Clear Abdomen: Soft, nontender, nondistended.  Extremities: No cyanosis, clubbing, rash, edema; LUE PICC site looks OK Neuro: Alert & oriented x 3, R arm/hand weakness. Affect pleasant. + aphasia  ASSESSMENT & PLAN: 1. Chronic systolic CHF: Nonischemic cardiomyopathy known since 2018.  Improved initially in 2018 with DCCV, but has been back in atrial fibrillation since 2019 it appears and EF has  been low. Admission 10/21 for a/c HF w/ low output requiring milrinone . Echo showed EF 15-20% with mod-severe RV dilation and moderate RV dysfunction, D-shaped septum, dilated IVC, severe biatrial enlargement. Significant RV failure with ascites on CT abdomen, had paracentesis.  Cardiac MRI showed LV EF 24%, RV mildly dilated with EF 42%, extensive LGE in a non-coronary distribution throughout much of the LV, also involving RV free wall and left and right atrial walls, ECV 42%. This was suggestive of infiltrative disease. 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  He was initially on milrinone  for low output HF, this was weaned off for DCCV which he failed. RHC 11/11/19 off milrinone  showed optimized filling pressures but low cardiac output, milrinone  restarted and has been continued since that time. He was sent home in 11/21 on hospice but hospice released him. Echo in 6/23 showed EF 25-30%, moderate RV dysfunction.  Echo 8/24 showed EF 25-30%, no LVH, moderate decreased RV systolic function, moderate MR, severe biatrial enlargement, IVC dilated. NYHA II, he does not appear volume overloaded on exam. - Continue home milrinone  0.125, he has done quite well with this.  No PICC line complications.    - Continue torsemide  30 mg daily + 20 KCL daily. He is due for labs with HH, via PICC line.  - Continue Farxiga  10 mg daily. No GU symptoms. - Continue spiro 25 mg daily  - BP too low for ARB/ARNi - No digoxin  with history of bradycardia.   - Narrow QRS, not CRT candidate.  - Not a transplant candidate and not a good candidate for LVAD. Patient assessed for LVAD, we had significant concerns about his right arm/hand weakness.  He would be unable to manage his LVAD equipment and would need consistent  24 hr caregiver (not available).       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  He has also been started on eplontersen  with improvement in peripheral neuropathy symptoms.  2. Atrial fibrillation/flutter: Now permanent.  Initially in 2018, EF improved after DCCV.  However, as above, he has been relatively well rate controlled and has not been in RVR, so do not think that cardiomyopathy is primarily due  to AF.  He failed DCCV on amiodarone  10/21, amiodarone  stopped due to bradycardia. 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  - Continue Eliquis .  3. Ascites: Suspect due to RV failure. S/p Paracentesis on 10/31 with 4.8 L out.  - Stable without evidence of recurrence on exam today  4. H/o CVA: In 2010, with chronic expressive aphasia and right-sided weakness.  - Stable, no change.  5. Hyperlipidemia: Lipids stable in 4/24. Will have HH RN get lipids at next lab draw.  Follow up in 3-4 months with Dr. Rolan.  Harlene CHRISTELLA Gainer, FNP 07/07/23

## 2023-07-08 ENCOUNTER — Telehealth (HOSPITAL_COMMUNITY): Payer: Self-pay

## 2023-07-08 NOTE — Telephone Encounter (Signed)
 Called to confirm/remind patient of their appointment at the [facility] on 07/09/23.   Appointment:   [] Confirmed  [x] Left mess   [] No answer/No voice mail  [] VM Full/unable to leave message  [] Phone not in service  And to bring in all medications and/or complete list.

## 2023-07-09 ENCOUNTER — Encounter (HOSPITAL_COMMUNITY): Payer: Self-pay

## 2023-07-09 ENCOUNTER — Ambulatory Visit (HOSPITAL_COMMUNITY)
Admission: RE | Admit: 2023-07-09 | Discharge: 2023-07-09 | Disposition: A | Discharge status: Home / Self Care | Source: Ambulatory Visit | Attending: Family Medicine | Admitting: Family Medicine

## 2023-07-09 ENCOUNTER — Other Ambulatory Visit: Payer: Self-pay

## 2023-07-09 ENCOUNTER — Ambulatory Visit: Admitting: Podiatry

## 2023-07-09 ENCOUNTER — Other Ambulatory Visit: Payer: Self-pay | Admitting: Pharmacy Technician

## 2023-07-09 VITALS — BP 104/76 | HR 67 | Ht 67.0 in | Wt 128.4 lb

## 2023-07-09 DIAGNOSIS — I69320 Aphasia following cerebral infarction: Secondary | ICD-10-CM | POA: Insufficient documentation

## 2023-07-09 DIAGNOSIS — I428 Other cardiomyopathies: Secondary | ICD-10-CM | POA: Insufficient documentation

## 2023-07-09 DIAGNOSIS — I4892 Unspecified atrial flutter: Secondary | ICD-10-CM | POA: Insufficient documentation

## 2023-07-09 DIAGNOSIS — Z7901 Long term (current) use of anticoagulants: Secondary | ICD-10-CM | POA: Diagnosis not present

## 2023-07-09 DIAGNOSIS — E1142 Type 2 diabetes mellitus with diabetic polyneuropathy: Secondary | ICD-10-CM | POA: Insufficient documentation

## 2023-07-09 DIAGNOSIS — Z87898 Personal history of other specified conditions: Secondary | ICD-10-CM | POA: Diagnosis not present

## 2023-07-09 DIAGNOSIS — Z79899 Other long term (current) drug therapy: Secondary | ICD-10-CM | POA: Insufficient documentation

## 2023-07-09 DIAGNOSIS — I2510 Atherosclerotic heart disease of native coronary artery without angina pectoris: Secondary | ICD-10-CM | POA: Insufficient documentation

## 2023-07-09 DIAGNOSIS — E785 Hyperlipidemia, unspecified: Secondary | ICD-10-CM | POA: Insufficient documentation

## 2023-07-09 DIAGNOSIS — Z7984 Long term (current) use of oral hypoglycemic drugs: Secondary | ICD-10-CM | POA: Diagnosis not present

## 2023-07-09 DIAGNOSIS — I482 Chronic atrial fibrillation, unspecified: Secondary | ICD-10-CM | POA: Insufficient documentation

## 2023-07-09 DIAGNOSIS — I69351 Hemiplegia and hemiparesis following cerebral infarction affecting right dominant side: Secondary | ICD-10-CM | POA: Diagnosis not present

## 2023-07-09 DIAGNOSIS — I5022 Chronic systolic (congestive) heart failure: Secondary | ICD-10-CM | POA: Diagnosis not present

## 2023-07-09 NOTE — Progress Notes (Signed)
 Specialty Pharmacy Refill Coordination Note  [name] is a 68 y.o. male contacted today regarding refills of specialty medication(s) Tafamidis  (Vyndamax )   Patient requested Delivery   Delivery date: 07/15/23   Verified address: [address] [place] [address]   Medication will be filled on 07/14/23.  Spoke to patient's neice [name].

## 2023-07-09 NOTE — Patient Instructions (Signed)
 Medication Changes:  No Changes In Medications at this time.   Lab Work:  LIPID ORDER ADDED ON TO YOUR HOME LABS   Follow-Up in: 3 MONTHS WITH DR. ROLAN AS SCHEDULED   At the [facility], you and your health needs are our priority. We have a designated team specialized in the treatment of Heart Failure. This Care Team includes your primary Heart Failure Specialized Cardiologist (physician), Advanced Practice Providers (APPs- Physician Assistants and Nurse Practitioners), and Pharmacist who all work together to provide you with the care you need, when you need it.   You may see any of the following providers on your designated Care Team at your next follow up:  Dr. Toribio Fuel Dr. Ezra ROLAN Dr. Ria Commander Dr. Odis Brownie Greig Mosses, NP Caffie Shed, [place] [facility] [place], [place] Beckey Coe, NP [place] Lee, NP Tinnie Redman, PharmD   Please be sure to bring in all your medications bottles to every appointment.   Need to Contact Us :  If you have any questions or concerns before your next appointment please send us  a message through [facility] or call our office at [phone].    TO LEAVE A MESSAGE FOR THE NURSE SELECT OPTION 2, PLEASE LEAVE A MESSAGE INCLUDING: YOUR NAME DATE OF BIRTH CALL BACK NUMBER REASON FOR CALL**this is important as we prioritize the call backs  YOU WILL RECEIVE A CALL BACK THE SAME DAY AS LONG AS YOU CALL BEFORE 4:00 PM

## 2023-07-10 ENCOUNTER — Other Ambulatory Visit: Payer: Self-pay

## 2023-07-10 ENCOUNTER — Other Ambulatory Visit: Payer: Self-pay | Admitting: Pharmacy Technician

## 2023-07-10 ENCOUNTER — Ambulatory Visit: Admitting: Podiatry

## 2023-07-14 ENCOUNTER — Other Ambulatory Visit: Payer: Self-pay

## 2023-07-14 NOTE — Progress Notes (Signed)
 Specialty Pharmacy Refill Coordination Note  [name] is a 68 y.o. male contacted today regarding refills of specialty medication(s) Eplontersen  Sodium ([name] )   Patient requested Delivery   Delivery date: 07/17/23   Verified address: [address] [place] [address]   Medication will be filled on 07/16/23.

## 2023-07-15 ENCOUNTER — Other Ambulatory Visit: Payer: Self-pay

## 2023-07-17 ENCOUNTER — Telehealth (HOSPITAL_COMMUNITY): Payer: Self-pay | Admitting: Cardiology

## 2023-07-17 ENCOUNTER — Other Ambulatory Visit (HOSPITAL_COMMUNITY): Payer: Self-pay | Admitting: Cardiology

## 2023-07-17 DIAGNOSIS — I5022 Chronic systolic (congestive) heart failure: Secondary | ICD-10-CM

## 2023-07-17 MED ORDER — SIMVASTATIN 40 MG PO TABS: 40.0000 mg | ORAL_TABLET | Freq: Every day | ORAL | 11 refills | Status: DC

## 2023-07-17 MED ORDER — TORSEMIDE 20 MG PO TABS: 30.0000 mg | ORAL_TABLET | Freq: Every day | ORAL | 11 refills | Status: DC

## 2023-07-17 NOTE — Telephone Encounter (Signed)
 PICC line out 6 cm. Needs to run milrinone  through peripheral IV until replaced.  Please set up with IR to replace PICC ASAP.   Chima Astorino NP-C  12:40 PM

## 2023-07-17 NOTE — Addendum Note (Signed)
 Addended by: JERONA DALTON HERO on: 07/17/2023 04:34 PM   Modules accepted: Orders

## 2023-07-17 NOTE — Telephone Encounter (Signed)
 [facility] called to report picc line is out of place ~6 cm  Reports in the past hospital PICC team was able just to push it back on  Reports line is flushing properly, medication is infusing, and labs collected.   Please advise

## 2023-07-17 NOTE — Telephone Encounter (Signed)
 [facility] with [facility] [phone]  Reports HH does not have equipment for peripheral IV   Delon with IR [phone] 7/11 @ 230  Brother aware of appt details

## 2023-07-18 ENCOUNTER — Other Ambulatory Visit (HOSPITAL_COMMUNITY): Payer: Self-pay | Admitting: Adult Health

## 2023-07-18 ENCOUNTER — Ambulatory Visit (HOSPITAL_COMMUNITY)
Admission: RE | Admit: 2023-07-18 | Discharge: 2023-07-18 | Disposition: A | Discharge status: Home / Self Care | Source: Ambulatory Visit | Attending: Adult Health | Admitting: Adult Health

## 2023-07-18 DIAGNOSIS — I5022 Chronic systolic (congestive) heart failure: Secondary | ICD-10-CM

## 2023-07-18 HISTORY — PX: IR US GUIDE VASC ACCESS RIGHT: IMG2390

## 2023-07-18 HISTORY — PX: IR CV LINE INJECTION: IMG2294

## 2023-07-18 HISTORY — PX: IR FLUORO GUIDE CV LINE RIGHT: IMG2283

## 2023-07-18 MED ORDER — IOHEXOL 300 MG/ML  SOLN
50.0000 mL | Freq: Once | INTRAMUSCULAR | Status: AC | PRN
  Administered 2023-07-18: 40 mL via INTRAVENOUS

## 2023-07-18 MED ORDER — LIDOCAINE HCL 1 % IJ SOLN
20.0000 mL | Freq: Once | INTRAMUSCULAR | Status: AC
  Administered 2023-07-18: 20 mL

## 2023-07-18 MED ORDER — HEPARIN SOD (PORK) LOCK FLUSH 100 UNIT/ML IV SOLN
INTRAVENOUS | Status: AC
  Filled 2023-07-18: qty 5

## 2023-07-18 MED ORDER — LIDOCAINE HCL 1 % IJ SOLN
INTRAMUSCULAR | Status: AC
  Filled 2023-07-18: qty 20

## 2023-07-26 ENCOUNTER — Inpatient Hospital Stay (HOSPITAL_COMMUNITY)
Admission: EM | Admit: 2023-07-26 | Discharge: 2023-07-29 | DRG: 315 | Disposition: A | Discharge status: Home / Self Care | Attending: Family Medicine | Admitting: Family Medicine

## 2023-07-26 ENCOUNTER — Emergency Department (HOSPITAL_COMMUNITY)

## 2023-07-26 ENCOUNTER — Other Ambulatory Visit: Payer: Self-pay

## 2023-07-26 DIAGNOSIS — R6510 Systemic inflammatory response syndrome (SIRS) of non-infectious origin without acute organ dysfunction: Secondary | ICD-10-CM | POA: Diagnosis present

## 2023-07-26 DIAGNOSIS — E119 Type 2 diabetes mellitus without complications: Secondary | ICD-10-CM

## 2023-07-26 DIAGNOSIS — E1122 Type 2 diabetes mellitus with diabetic chronic kidney disease: Secondary | ICD-10-CM | POA: Diagnosis present

## 2023-07-26 DIAGNOSIS — A419 Sepsis, unspecified organism: Secondary | ICD-10-CM | POA: Diagnosis not present

## 2023-07-26 DIAGNOSIS — I69351 Hemiplegia and hemiparesis following cerebral infarction affecting right dominant side: Secondary | ICD-10-CM

## 2023-07-26 DIAGNOSIS — Z95828 Presence of other vascular implants and grafts: Secondary | ICD-10-CM

## 2023-07-26 DIAGNOSIS — I10 Essential (primary) hypertension: Secondary | ICD-10-CM | POA: Diagnosis present

## 2023-07-26 DIAGNOSIS — Z7984 Long term (current) use of oral hypoglycemic drugs: Secondary | ICD-10-CM

## 2023-07-26 DIAGNOSIS — E785 Hyperlipidemia, unspecified: Secondary | ICD-10-CM | POA: Diagnosis present

## 2023-07-26 DIAGNOSIS — R509 Fever, unspecified: Secondary | ICD-10-CM | POA: Diagnosis present

## 2023-07-26 DIAGNOSIS — Z8249 Family history of ischemic heart disease and other diseases of the circulatory system: Secondary | ICD-10-CM

## 2023-07-26 DIAGNOSIS — R42 Dizziness and giddiness: Secondary | ICD-10-CM | POA: Diagnosis not present

## 2023-07-26 DIAGNOSIS — I5022 Chronic systolic (congestive) heart failure: Secondary | ICD-10-CM | POA: Diagnosis present

## 2023-07-26 DIAGNOSIS — Z7401 Bed confinement status: Secondary | ICD-10-CM | POA: Diagnosis not present

## 2023-07-26 DIAGNOSIS — Z8673 Personal history of transient ischemic attack (TIA), and cerebral infarction without residual deficits: Secondary | ICD-10-CM

## 2023-07-26 DIAGNOSIS — I959 Hypotension, unspecified: Principal | ICD-10-CM | POA: Diagnosis present

## 2023-07-26 DIAGNOSIS — I428 Other cardiomyopathies: Secondary | ICD-10-CM | POA: Diagnosis present

## 2023-07-26 DIAGNOSIS — Z79899 Other long term (current) drug therapy: Secondary | ICD-10-CM

## 2023-07-26 DIAGNOSIS — I4892 Unspecified atrial flutter: Secondary | ICD-10-CM | POA: Diagnosis present

## 2023-07-26 DIAGNOSIS — Z833 Family history of diabetes mellitus: Secondary | ICD-10-CM

## 2023-07-26 DIAGNOSIS — Z7901 Long term (current) use of anticoagulants: Secondary | ICD-10-CM

## 2023-07-26 DIAGNOSIS — R531 Weakness: Secondary | ICD-10-CM | POA: Diagnosis present

## 2023-07-26 DIAGNOSIS — Z1152 Encounter for screening for COVID-19: Secondary | ICD-10-CM

## 2023-07-26 DIAGNOSIS — I4821 Permanent atrial fibrillation: Secondary | ICD-10-CM | POA: Diagnosis present

## 2023-07-26 DIAGNOSIS — R300 Dysuria: Secondary | ICD-10-CM | POA: Diagnosis present

## 2023-07-26 DIAGNOSIS — I2510 Atherosclerotic heart disease of native coronary artery without angina pectoris: Secondary | ICD-10-CM | POA: Diagnosis present

## 2023-07-26 DIAGNOSIS — I130 Hypertensive heart and chronic kidney disease with heart failure and stage 1 through stage 4 chronic kidney disease, or unspecified chronic kidney disease: Secondary | ICD-10-CM | POA: Diagnosis present

## 2023-07-26 DIAGNOSIS — E1140 Type 2 diabetes mellitus with diabetic neuropathy, unspecified: Secondary | ICD-10-CM | POA: Diagnosis present

## 2023-07-26 DIAGNOSIS — N1831 Chronic kidney disease, stage 3a: Secondary | ICD-10-CM | POA: Diagnosis present

## 2023-07-26 DIAGNOSIS — I43 Cardiomyopathy in diseases classified elsewhere: Secondary | ICD-10-CM | POA: Diagnosis present

## 2023-07-26 DIAGNOSIS — E871 Hypo-osmolality and hyponatremia: Secondary | ICD-10-CM | POA: Diagnosis present

## 2023-07-26 DIAGNOSIS — I69320 Aphasia following cerebral infarction: Secondary | ICD-10-CM

## 2023-07-26 DIAGNOSIS — E854 Organ-limited amyloidosis: Secondary | ICD-10-CM | POA: Diagnosis present

## 2023-07-26 LAB — CBC WITH DIFFERENTIAL/PLATELET
Abs Immature Granulocytes: 0.07 K/uL (ref 0.00–0.07)
Basophils Absolute: 0 K/uL (ref 0.0–0.1)
Basophils Relative: 0 %
Eosinophils Absolute: 0 K/uL (ref 0.0–0.5)
Eosinophils Relative: 0 %
HCT: 41.5 % (ref 39.0–52.0)
Hemoglobin: 13.3 g/dL (ref 13.0–17.0)
Immature Granulocytes: 1 %
Lymphocytes Relative: 4 %
Lymphs Abs: 0.4 K/uL — ABNORMAL LOW (ref 0.7–4.0)
MCH: 29.2 pg (ref 26.0–34.0)
MCHC: 32 g/dL (ref 30.0–36.0)
MCV: 91 fL (ref 80.0–100.0)
Monocytes Absolute: 0.5 K/uL (ref 0.1–1.0)
Monocytes Relative: 6 %
Neutro Abs: 7.9 K/uL — ABNORMAL HIGH (ref 1.7–7.7)
Neutrophils Relative %: 89 %
Platelets: 112 K/uL — ABNORMAL LOW (ref 150–400)
RBC: 4.56 MIL/uL (ref 4.22–5.81)
RDW: 14.5 % (ref 11.5–15.5)
WBC: 8.9 K/uL (ref 4.0–10.5)
nRBC: 0 % (ref 0.0–0.2)

## 2023-07-26 LAB — URINALYSIS, W/ REFLEX TO CULTURE (INFECTION SUSPECTED)
Bacteria, UA: NONE SEEN
Bilirubin Urine: NEGATIVE
Glucose, UA: >=500 mg/dL — AB
Hgb urine dipstick: NEGATIVE
Ketones, ur: NEGATIVE mg/dL
Leukocytes,Ua: NEGATIVE
Nitrite: NEGATIVE
Protein, ur: NEGATIVE mg/dL
Specific Gravity, Urine: 1.008 (ref 1.005–1.030)
pH: 5 (ref 5.0–8.0)

## 2023-07-26 LAB — COMPREHENSIVE METABOLIC PANEL WITH GFR
ALT: 40 U/L (ref 0–44)
AST: 57 U/L — ABNORMAL HIGH (ref 15–41)
Albumin: 4.2 g/dL (ref 3.5–5.0)
Alkaline Phosphatase: 70 U/L (ref 38–126)
Anion gap: 10 (ref 5–15)
BUN: 28 mg/dL — ABNORMAL HIGH (ref 8–23)
CO2: 24 mmol/L (ref 22–32)
Calcium: 9.3 mg/dL (ref 8.9–10.3)
Chloride: 97 mmol/L — ABNORMAL LOW (ref 98–111)
Creatinine, Ser: 1.53 mg/dL — ABNORMAL HIGH (ref 0.61–1.24)
GFR, Estimated: 50 mL/min — ABNORMAL LOW (ref >60)
Glucose, Bld: 129 mg/dL — ABNORMAL HIGH (ref 70–99)
Potassium: 4 mmol/L (ref 3.5–5.1)
Sodium: 131 mmol/L — ABNORMAL LOW (ref 135–145)
Total Bilirubin: 1.6 mg/dL — ABNORMAL HIGH (ref 0.0–1.2)
Total Protein: 8 g/dL (ref 6.5–8.1)

## 2023-07-26 LAB — RESP PANEL BY RT-PCR (RSV, FLU A&B, COVID)  RVPGX2
Influenza A by PCR: NEGATIVE
Influenza B by PCR: NEGATIVE
Resp Syncytial Virus by PCR: NEGATIVE
SARS Coronavirus 2 by RT PCR: NEGATIVE

## 2023-07-26 LAB — I-STAT CG4 LACTIC ACID, ED
Lactic Acid, Venous: 1.1 mmol/L (ref 0.5–1.9)
Lactic Acid, Venous: 1.2 mmol/L (ref 0.5–1.9)

## 2023-07-26 MED ORDER — INSULIN ASPART 100 UNIT/ML IJ SOLN
0.0000 [IU] | Freq: Three times a day (TID) | INTRAMUSCULAR | Status: DC
  Administered 2023-07-27: 1 [IU] via SUBCUTANEOUS
  Administered 2023-07-28: 5 [IU] via SUBCUTANEOUS
  Administered 2023-07-29: 2 [IU] via SUBCUTANEOUS
  Filled 2023-07-26: qty 0.09

## 2023-07-26 MED ORDER — VANCOMYCIN HCL 750 MG/150ML IV SOLN
750.0000 mg | INTRAVENOUS | Status: DC
  Administered 2023-07-27 – 2023-07-28 (×2): 750 mg via INTRAVENOUS
  Filled 2023-07-26 (×2): qty 150

## 2023-07-26 MED ORDER — IOHEXOL 300 MG/ML  SOLN
100.0000 mL | Freq: Once | INTRAMUSCULAR | Status: AC | PRN
  Administered 2023-07-26: 80 mL via INTRAVENOUS

## 2023-07-26 MED ORDER — SODIUM CHLORIDE 0.9 % IV SOLN
2.0000 g | Freq: Two times a day (BID) | INTRAVENOUS | Status: DC
  Administered 2023-07-27 – 2023-07-29 (×6): 2 g via INTRAVENOUS
  Filled 2023-07-26 (×6): qty 12.5

## 2023-07-26 MED ORDER — ACETAMINOPHEN 650 MG RE SUPP: 650.0000 mg | Freq: Four times a day (QID) | RECTAL | Status: DC | PRN

## 2023-07-26 MED ORDER — POTASSIUM CHLORIDE CRYS ER 10 MEQ PO TBCR
20.0000 meq | EXTENDED_RELEASE_TABLET | Freq: Every day | ORAL | Status: DC
  Administered 2023-07-27 – 2023-07-28 (×2): 20 meq via ORAL
  Filled 2023-07-26 (×2): qty 2

## 2023-07-26 MED ORDER — MILRINONE LACTATE IN DEXTROSE 20-5 MG/100ML-% IV SOLN: 0.2500 ug/kg/min | Freq: Every day | INTRAVENOUS | Status: DC

## 2023-07-26 MED ORDER — SODIUM CHLORIDE 0.9 % IV BOLUS
500.0000 mL | Freq: Once | INTRAVENOUS | Status: AC
  Administered 2023-07-26: 500 mL via INTRAVENOUS

## 2023-07-26 MED ORDER — SIMVASTATIN 20 MG PO TABS
40.0000 mg | ORAL_TABLET | Freq: Every day | ORAL | Status: DC
  Administered 2023-07-27 – 2023-07-28 (×2): 40 mg via ORAL
  Filled 2023-07-26 (×2): qty 2

## 2023-07-26 MED ORDER — LINAGLIPTIN 5 MG PO TABS
5.0000 mg | ORAL_TABLET | Freq: Every morning | ORAL | Status: DC
  Administered 2023-07-27 – 2023-07-29 (×3): 5 mg via ORAL
  Filled 2023-07-26 (×3): qty 1

## 2023-07-26 MED ORDER — APIXABAN 2.5 MG PO TABS
5.0000 mg | ORAL_TABLET | Freq: Two times a day (BID) | ORAL | Status: DC
  Administered 2023-07-27 – 2023-07-29 (×6): 5 mg via ORAL
  Filled 2023-07-26 (×6): qty 2

## 2023-07-26 MED ORDER — SODIUM CHLORIDE 0.9 % IV SOLN
1.0000 g | Freq: Once | INTRAVENOUS | Status: AC
  Administered 2023-07-26: 1 g via INTRAVENOUS
  Filled 2023-07-26: qty 10

## 2023-07-26 MED ORDER — VANCOMYCIN HCL 1250 MG/250ML IV SOLN
1250.0000 mg | Freq: Once | INTRAVENOUS | Status: AC
  Administered 2023-07-27: 1250 mg via INTRAVENOUS
  Filled 2023-07-26 (×2): qty 250

## 2023-07-26 MED ORDER — ACETAMINOPHEN 500 MG PO TABS
1000.0000 mg | ORAL_TABLET | Freq: Once | ORAL | Status: AC
  Administered 2023-07-26: 1000 mg via ORAL
  Filled 2023-07-26: qty 2

## 2023-07-26 MED ORDER — ACETAMINOPHEN 325 MG PO TABS
650.0000 mg | ORAL_TABLET | Freq: Four times a day (QID) | ORAL | Status: DC | PRN
  Administered 2023-07-27 – 2023-07-28 (×3): 650 mg via ORAL
  Filled 2023-07-26 (×3): qty 2

## 2023-07-26 MED ORDER — DAPAGLIFLOZIN PROPANEDIOL 10 MG PO TABS: 10.0000 mg | ORAL_TABLET | Freq: Every day | ORAL | Status: DC

## 2023-07-26 MED ORDER — TAFAMIDIS 61 MG PO CAPS
1.0000 | ORAL_CAPSULE | Freq: Every day | ORAL | Status: DC
  Administered 2023-07-27 – 2023-07-29 (×3): 61 mg via ORAL
  Filled 2023-07-26 (×3): qty 1

## 2023-07-26 NOTE — ED Provider Notes (Signed)
 [place] EMERGENCY DEPARTMENT AT [facility] Provider Note  CSN: [id number] Arrival date & time: 07/26/23 1425  Chief Complaint(s) Dysuria and Dizziness  HPI [name] is a 68 y.o. 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  Patient lives with niece.  Niece reports that today he was just lying around all day, went back to bed after breakfast which is unusual for him.  Normally pretty active and gets around.  Did report had some dysuria.  Did not noted fevers at home.  No nausea or vomiting, chest pain, cough, difficulty breathing, leg swelling, abdominal pain, back pain.  Does have indwelling right chest catheter.   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   Sepsis (HCC) 07/26/2023   Encounter for hospice care discussion    Palliative care by specialist    Cardiogenic shock (HCC) 11/05/2019   end stage NICM (nonischemic cardiomyopathy) (HCC) 07/16/2019   Permanent atrial fibrillation (HCC)    Acute on chronic HFrEF (heart failure with reduced ejection fraction) (HCC)    Essential hypertension 07/15/2017   Type 2 diabetes mellitus without complication, without long-term current use of insulin  (HCC) 09/09/2016   Cardiomyopathy (HCC) 06/02/2008   ABNORMAL EKG 05/02/2008   Controlled type 2 diabetes mellitus without complication, without long-term current use of insulin  (HCC) 04/28/2008   HLD (hyperlipidemia) 04/28/2008   Migraine headache 04/28/2008   History of stroke 04/28/2008   Home  Medication(s) Prior to Admission medications   Medication Sig Start Date End Date Taking? Authorizing Provider  Accu-Chek FastClix Lancets MISC 1 each by Other route as directed. 08/19/19   [provider]  ACCU-CHEK GUIDE test strip 1 each by Other route See admin instructions. 09/16/19   [provider]  acetaminophen  (TYLENOL ) 500 MG tablet Take 1,000 mg by mouth every 6 (six) hours as needed for pain. Patient taking differently: Take 1,000 mg by mouth as needed for pain.    [provider]  ELIQUIS  5 MG TABS tablet TAKE 1 TABLET(5 MG) BY MOUTH TWICE DAILY 12/18/21   McLean, Dalton S, MD  [place]  Sodium ([name] ) 45 MG/0.8ML SOAJ Inject 45 mg into the skin every 30 (thirty) days. 04/22/23   Rolan Ezra RAMAN, MD  FARXIGA  10 MG TABS tablet TAKE 1 TABLET(10 MG) BY MOUTH DAILY BEFORE BREAKFAST 05/21/23   McLean, Dalton S, MD  Loratadine  (CLARITIN  PO) Take 10 mg by mouth as needed.    [provider]  milrinone  (PRIMACOR ) 20 MG/100 ML SOLN infusion Inject 0.0139 mg/min into the vein continuous. Patient taking differently: Inject 0.25 mcg/kg/min into the vein daily in the afternoon. 11/16/19   Leotis Bogus, MD  Omega-3 Fatty Acids (FISH OIL) 1000 MG CAPS Take 1 capsule by mouth daily.    [provider]  potassium chloride  (KLOR-CON ) 10 MEQ tablet Take 2 tablets (20 mEq total) by mouth daily. 08/26/22   Rolan Ezra RAMAN, MD  simvastatin  (ZOCOR ) 40 MG tablet Take 1 tablet (40 mg total) by mouth daily. 07/17/23   Rolan Ezra RAMAN, MD  spironolactone  (ALDACTONE ) 25  MG tablet Take 1 tablet (25 mg total) by mouth daily. 07/24/21   Rolan Ezra RAMAN, MD  Tafamidis  (VYNDAMAX ) 61 MG CAPS Take 1 capsule by mouth daily. 12/17/22   Rolan Ezra RAMAN, MD  torsemide  (DEMADEX ) 20 MG tablet Take 1.5 tablets (30 mg total) by mouth daily. 07/17/23   Rolan Ezra RAMAN, MD  TRADJENTA  5 MG TABS tablet Take 5 mg by mouth every morning. 04/27/20   [provider]                                                                                                                                     Past Surgical History Past Surgical History:  Procedure Laterality Date   CARDIOVERSION N/A 10/10/2016   Procedure: CARDIOVERSION;  Surgeon: Pietro Redell RAMAN, MD;  Location: [facility] ENDOSCOPY;  Service: Cardiovascular;  Laterality: N/A;   CARDIOVERSION N/A 11/10/2019   Procedure: CARDIOVERSION;  Surgeon: Rolan Ezra RAMAN, MD;  Location: [facility] ENDOSCOPY;  Service: Cardiovascular;  Laterality: N/A;   IR CV LINE INJECTION  07/18/2023   IR FLUORO GUIDE CV LINE RIGHT  07/18/2023   IR FLUOROGUIDE CV LINE NO REPORT  07/15/2022   IR US  GUIDE VASC ACCESS RIGHT  07/18/2023   RIGHT HEART CATH N/A 11/11/2019   Procedure: RIGHT HEART CATH;  Surgeon: Rolan Ezra RAMAN, MD;  Location: [facility] INVASIVE CV LAB;  Service: Cardiovascular;  Laterality: N/A;   RIGHT/LEFT HEART CATH AND CORONARY ANGIOGRAPHY N/A 09/06/2016   Procedure: RIGHT/LEFT HEART CATH AND CORONARY ANGIOGRAPHY;  Surgeon: Burnard Debby LABOR, MD;  Location: [facility] INVASIVE CV LAB;  Service: Cardiovascular;  Laterality: N/A;   TRACHEOSTOMY     Family History Family History  Problem Relation Age of Onset   Hypertension Mother    Diabetes Mother    Heart disease Father     Social History Social History   Tobacco Use   Smoking status: Never   Smokeless tobacco: Never  Vaping Use   Vaping status: Never Used  Substance Use Topics   Alcohol use: No   Drug use: No   Allergies Patient has no known allergies.  Review of Systems Review of Systems  All other systems reviewed and are negative.   Physical Exam Vital Signs  I have reviewed the triage vital signs BP (!) 80/62   Pulse (!) 59   Temp 98 F (36.7 C)   Resp 15   Ht 5' 7 (1.702 m)   Wt 56.7 kg   SpO2 99%   BMI 19.58 kg/m  Physical Exam Vitals and nursing note reviewed.  Constitutional:      General: He is not in acute distress.    Appearance: Normal appearance.  HENT:      Mouth/Throat:     Mouth: Mucous membranes are moist.  Eyes:     Conjunctiva/sclera: Conjunctivae normal.  Cardiovascular:     Rate and Rhythm: Normal rate and regular rhythm.  Pulmonary:  Effort: Pulmonary effort is normal. No respiratory distress.     Breath sounds: Normal breath sounds.  Abdominal:     General: Abdomen is flat.     Palpations: Abdomen is soft.     Tenderness: There is no abdominal tenderness.  Musculoskeletal:     Right lower leg: No edema.     Left lower leg: No edema.  Skin:    General: Skin is warm and dry.     Capillary Refill: Capillary refill takes less than 2 seconds.     Comments: Right anterior chest tunneled catheter site clean, dry, intact.  Neurological:     Mental Status: He is alert and oriented to person, place, and time. Mental status is at baseline.  Psychiatric:        Mood and Affect: Mood normal.        Behavior: Behavior normal.     ED Results and Treatments Labs (all labs ordered are listed, but only abnormal results are displayed) Labs Reviewed  COMPREHENSIVE METABOLIC PANEL WITH GFR - Abnormal; Notable for the following components:      Result Value   Sodium 131 (*)    Chloride 97 (*)    Glucose, Bld 129 (*)    BUN 28 (*)    Creatinine, Ser 1.53 (*)    AST 57 (*)    Total Bilirubin 1.6 (*)    GFR, Estimated 50 (*)    All other components within normal limits  CBC WITH DIFFERENTIAL/PLATELET - Abnormal; Notable for the following components:   Platelets 112 (*)    Neutro Abs 7.9 (*)    Lymphs Abs 0.4 (*)    All other components within normal limits  URINALYSIS, W/ REFLEX TO CULTURE (INFECTION SUSPECTED) - Abnormal; Notable for the following components:   Glucose, UA >=500 (*)    All other components within normal limits  RESP PANEL BY RT-PCR (RSV, FLU A&B, COVID)  RVPGX2  CULTURE, BLOOD (ROUTINE X 2)  CULTURE, BLOOD (ROUTINE X 2)  URINE CULTURE  RESPIRATORY PANEL BY PCR  PROCALCITONIN  PROCALCITONIN  I-STAT CG4  LACTIC ACID, ED  I-STAT CG4 LACTIC ACID, ED                                                                                                                          Radiology CT ABDOMEN PELVIS W CONTRAST Result Date: 07/26/2023 CLINICAL DATA:  Abdominal pain, acute, nonlocalized EXAM: CT ABDOMEN AND PELVIS WITH CONTRAST TECHNIQUE: Multidetector CT imaging of the abdomen and pelvis was performed using the standard protocol following bolus administration of intravenous contrast. RADIATION DOSE REDUCTION: This exam was performed according to the departmental dose-optimization program which includes automated exposure control, adjustment of the mA and/or kV according to patient size and/or use of iterative reconstruction technique. CONTRAST:  80mL OMNIPAQUE  IOHEXOL  300 MG/ML  SOLN COMPARISON:  CT abdomen pelvis 11/05/2019 FINDINGS: Lower chest: Cardiomegaly.  No acute abnormality. Hepatobiliary: The hepatic parenchyma is diffusely hypodense compared to  the splenic parenchyma consistent with fatty infiltration. No focal liver abnormality. No gallstones, gallbladder wall thickening, or pericholecystic fluid. No biliary dilatation. Pancreas: No focal lesion. Normal pancreatic contour. No surrounding inflammatory changes. No main pancreatic ductal dilatation. Spleen: Normal in size without focal abnormality. Adrenals/Urinary Tract: No adrenal nodule bilaterally. Bilateral kidneys enhance symmetrically. No hydronephrosis. No hydroureter. No nephroureterolithiasis bilaterally. Subcentimeter hypodensities are too small to characterize-no further follow-up indicated. The urinary bladder is unremarkable. No excretion of intravenous contrast on delayed view from either kidneys. Stomach/Bowel: Stomach is within normal limits. No evidence of bowel wall thickening or dilatation. Appendix appears normal. Vascular/Lymphatic: No abdominal aorta or iliac aneurysm. Moderate atherosclerotic plaque of the aorta and its branches. No  abdominal, pelvic, or inguinal lymphadenopathy. Reproductive: Prostate is unremarkable. Other: No intraperitoneal free fluid. No intraperitoneal free gas. No organized fluid collection. Musculoskeletal: No abdominal wall hernia or abnormality. No suspicious lytic or blastic osseous lesions. No acute displaced fracture. Multilevel degenerative changes of the spine. IMPRESSION: 1. No acute intra-abdominal or intrapelvic abnormality. 2. No excretion of intravenous contrast on delayed view from either kidneys. Correlate with renal function test. 3. Cardiomegaly. 4. Mild hepatic steatosis. 5.  Aortic Atherosclerosis (ICD10-I70.0). Electronically Signed   By: Morgane  Naveau M.D.   On: 07/26/2023 19:27   DG Chest Portable 1 View Result Date: 07/26/2023 CLINICAL DATA:  Decreased activity.  Reported dysuria. EXAM: PORTABLE CHEST 1 VIEW COMPARISON:  06/20/2022 and older exams. FINDINGS: Mild stable enlargement of the cardiac silhouette. No mediastinal or hilar masses. Clear lungs.  No pleural effusion or pneumothorax. Right anterior chest wall central venous catheter extends to the internal jugular, tip in the mid superior vena cava. Skeletal structures are grossly intact. IMPRESSION: No active disease. Electronically Signed   By: Alm Parkins M.D.   On: 07/26/2023 17:30    Pertinent labs & imaging results that were available during my care of the patient were reviewed by me and considered in my medical decision making (see MDM for details).  Medications Ordered in ED Medications  ceFEPIme  (MAXIPIME ) 2 g in sodium chloride  0.9 % 100 mL IVPB (has no administration in time range)  vancomycin  (VANCOREADY) IVPB 1250 mg/250 mL (has no administration in time range)  vancomycin  (VANCOREADY) IVPB 750 mg/150 mL (has no administration in time range)  sodium chloride  0.9 % bolus 500 mL (0 mLs Intravenous Stopped 07/26/23 1959)  acetaminophen  (TYLENOL ) tablet 1,000 mg (1,000 mg Oral Given 07/26/23 1613)  iohexol   (OMNIPAQUE ) 300 MG/ML solution 100 mL (80 mLs Intravenous Contrast Given 07/26/23 1821)  sodium chloride  0.9 % bolus 500 mL (0 mLs Intravenous Stopped 07/26/23 2000)  cefTRIAXone  (ROCEPHIN ) 1 g in sodium chloride  0.9 % 100 mL IVPB (1 g Intravenous New Bag/Given 07/26/23 1959)  Procedures .Critical Care  Performed by: Francesca Elsie CROME, MD Authorized by: Francesca Elsie CROME, MD   Critical care provider statement:    Critical care time (minutes):  30   Critical care was necessary to treat or prevent imminent or life-threatening deterioration of the following conditions:  Sepsis   Critical care was time spent personally by me on the following activities:  Development of treatment plan with patient or surrogate, discussions with consultants, evaluation of patient's response to treatment, examination of patient, ordering and review of laboratory studies, ordering and review of radiographic studies, ordering and performing treatments and interventions, pulse oximetry, re-evaluation of patient's condition and review of old charts   Care discussed with: admitting provider     (including critical care time)  Medical Decision Making / ED Course   MDM:  68 year old presenting to the emergency department with weakness.  Patient overall well-appearing.  Vitals are notable for fever.  Blood pressure borderline low.  Unclear cause of fever, suspect weakness is due to fever.  Suspected UTI given urinary symptoms however urinalysis is bland.  Will obtain chest x-ray although lungs seem clear on exam patient denies cough.  Differential also includes bloodstream infection as patient does have indwelling line  Clinical Course as of 07/26/23 2111  Sat Jul 26, 2023  2109 Workup overall reassuring. Normal lactic. CT abdomen also performed as urine was bland without source. CXR  negative. No recurrent fever. Patient feeling well. Recommended admission for observation given indwelling line. Gave dose of antibiotics and fluids as patient did have some hypotension. Not given 30 cc/kg given CHF and normal lactic, normal mentation. Suspect BP is always on lower side. Patient admitted for further management.  [WS]    Clinical Course User Index [WS] Francesca Elsie CROME, MD     Additional history obtained: -Additional history obtained from family -External records from outside source obtained and reviewed including: Chart review including previous notes, labs, imaging, consultation notes including prior notes    Lab Tests: -I ordered, reviewed, and interpreted labs.   The pertinent results include:   Labs Reviewed  COMPREHENSIVE METABOLIC PANEL WITH GFR - Abnormal; Notable for the following components:      Result Value   Sodium 131 (*)    Chloride 97 (*)    Glucose, Bld 129 (*)    BUN 28 (*)    Creatinine, Ser 1.53 (*)    AST 57 (*)    Total Bilirubin 1.6 (*)    GFR, Estimated 50 (*)    All other components within normal limits  CBC WITH DIFFERENTIAL/PLATELET - Abnormal; Notable for the following components:   Platelets 112 (*)    Neutro Abs 7.9 (*)    Lymphs Abs 0.4 (*)    All other components within normal limits  URINALYSIS, W/ REFLEX TO CULTURE (INFECTION SUSPECTED) - Abnormal; Notable for the following components:   Glucose, UA >=500 (*)    All other components within normal limits  RESP PANEL BY RT-PCR (RSV, FLU A&B, COVID)  RVPGX2  CULTURE, BLOOD (ROUTINE X 2)  CULTURE, BLOOD (ROUTINE X 2)  URINE CULTURE  RESPIRATORY PANEL BY PCR  PROCALCITONIN  PROCALCITONIN  I-STAT CG4 LACTIC ACID, ED  I-STAT CG4 LACTIC ACID, ED    Notable for no leukocytosis, normal lactic   EKG   EKG Interpretation Date/Time:  Saturday July 26 2023 16:34:06 EDT Ventricular Rate:  68 PR Interval:    QRS Duration:  113 QT Interval:  380 QTC Calculation: 405 R  Axis:   -10  Text Interpretation: Atrial flutter Paired ventricular premature complexes Borderline intraventricular conduction delay Repol abnrm suggests ischemia, diffuse leads No significant change since last tracing Confirmed by Francesca Fallow ([id number]) on 07/26/2023 5:52:28 PM         Imaging Studies ordered: I ordered imaging studies including CXR, CT abdomen On my interpretation imaging demonstrates no acute process I independently visualized and interpreted imaging. I agree with the radiologist interpretation   Medicines ordered and prescription drug management: Meds ordered this encounter  Medications   sodium chloride  0.9 % bolus 500 mL   acetaminophen  (TYLENOL ) tablet 1,000 mg   iohexol  (OMNIPAQUE ) 300 MG/ML solution 100 mL   sodium chloride  0.9 % bolus 500 mL   cefTRIAXone  (ROCEPHIN ) 1 g in sodium chloride  0.9 % 100 mL IVPB    Antibiotic Indication::   Other Indication (list below)   ceFEPIme  (MAXIPIME ) 2 g in sodium chloride  0.9 % 100 mL IVPB    Antibiotic Indication::   Sepsis   vancomycin  (VANCOREADY) IVPB 1250 mg/250 mL    Indication::   Sepsis   vancomycin  (VANCOREADY) IVPB 750 mg/150 mL    Indication::   Sepsis    -I have reviewed the patients home medicines and have made adjustments as needed   Consultations Obtained: I requested consultation with the hospitalist,  and discussed lab and imaging findings as well as pertinent plan - they recommend: admission   Cardiac Monitoring: The patient was maintained on a cardiac monitor.  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    Final Clinical Impression(s) / ED Diagnoses Final diagnoses:  Fever present on examination     This chart was dictated using voice recognition software.  Despite best efforts to proofread,  errors can occur which can change the documentation meaning.    Francesca Fallow CROME, MD 07/26/23 2111

## 2023-07-26 NOTE — H&P (Signed)
 History and Physical    PatientHerndon Grill [name]:[id number] DOB: 05-25-1955 DOA: 07/26/2023 DOS: the patient was seen and examined on 07/26/2023 PCP: Alec House, MD  Patient coming from: Home - lives with his brother. Ambulates with cane    Chief Complaint: weakness and not feeling well   HPI: [name] is a 68 y.o. male with medical history significant of hx of CVA with aphasia,end stage NICM, HLD, T2DM, permanent atrial fibrillation who presented to ED with complaints of weakness. His niece gives history. This morning she could tell he was off. He didn't feel great. She checked his vitals and his blood pressure was 87/70 which she says was low for him. He usually runs around 100/70. He stayed in bed all morning and this is not like him at all. He told his niece he felt light headed and dizzy. He also stated he had some pain with urination. No fevers/chills at home. He had PICC line placed last week in his chest. His PICC line in his arm had come out and he needed a new PICC line placed.   He has no urinary urgency or frequency and has no problems urinating. He does endorse some dysuria and some supra pubic pain. Urine looks normal and has no odor.     Denies any fever/chills, vision changes/headaches, chest pain or palpitations, shortness of breath or cough, abdominal pain, N/V/D, or leg swelling.   He does not smoke or drink alcohol.    ER Course:  vitals: temp: 101.2, bp: 95/67, HR: 79, RR: 18>25, oxygen: 96%RA Pertinent labs: platelets 112, sodium: 131, BUN 28, creatinine: 1.53,  Lactic acid: 1.1>1.2,  CXR: no acute finding CT abdomen/pelvis: no acute finding. No excretion of IV contrast on delayed view from either kidneys. Correlate with renal function test. Cardiomegaly. Mild hepatic steatosis, aortic atherosclerosis.  In ED: given 500cc IVF bolus, BC and urine culture obtained. Rocpehin given. TRH asked to admit.    Review of Systems: As mentioned in the history of  present illness. All other systems reviewed and are negative. Past Medical History:  Diagnosis Date   Atrial flutter (HCC)    CHF (congestive heart failure) (HCC)    Diabetes mellitus    Hypertension    NICM (nonischemic cardiomyopathy) (HCC) 08/2016   Mild, nonobstructive CAD at cath, EF initially 20%, improved to 50% 11/2016 echo   Stroke [facility])    Past Surgical History:  Procedure Laterality Date   CARDIOVERSION N/A 10/10/2016   Procedure: CARDIOVERSION;  Surgeon: Pietro Redell RAMAN, MD;  Location: [facility] ENDOSCOPY;  Service: Cardiovascular;  Laterality: N/A;   CARDIOVERSION N/A 11/10/2019   Procedure: CARDIOVERSION;  Surgeon: Rolan Ezra RAMAN, MD;  Location: [facility] ENDOSCOPY;  Service: Cardiovascular;  Laterality: N/A;   IR CV LINE INJECTION  07/18/2023   IR FLUORO GUIDE CV LINE RIGHT  07/18/2023   IR FLUOROGUIDE CV LINE NO REPORT  07/15/2022   IR US  GUIDE VASC ACCESS RIGHT  07/18/2023   RIGHT HEART CATH N/A 11/11/2019   Procedure: RIGHT HEART CATH;  Surgeon: Rolan Ezra RAMAN, MD;  Location: [facility] INVASIVE CV LAB;  Service: Cardiovascular;  Laterality: N/A;   RIGHT/LEFT HEART CATH AND CORONARY ANGIOGRAPHY N/A 09/06/2016   Procedure: RIGHT/LEFT HEART CATH AND CORONARY ANGIOGRAPHY;  Surgeon: Burnard Debby LABOR, MD;  Location: [facility] INVASIVE CV LAB;  Service: Cardiovascular;  Laterality: N/A;   TRACHEOSTOMY     Social History:  reports that he has never smoked. He has never used smokeless tobacco. He reports that  he does not drink alcohol and does not use drugs.  No Known Allergies  Family History  Problem Relation Age of Onset   Hypertension Mother    Diabetes Mother    Heart disease Father     Prior to Admission medications   Medication Sig Start Date End Date Taking? Authorizing Provider  Accu-Chek FastClix Lancets MISC 1 each by Other route as directed. 08/19/19   [provider]  ACCU-CHEK GUIDE test strip 1 each by Other route See admin instructions. 09/16/19   [provider]   acetaminophen  (TYLENOL ) 500 MG tablet Take 1,000 mg by mouth every 6 (six) hours as needed for pain. Patient taking differently: Take 1,000 mg by mouth as needed for pain.    [provider]  ELIQUIS  5 MG TABS tablet TAKE 1 TABLET(5 MG) BY MOUTH TWICE DAILY 12/18/21   McLean, Dalton S, MD  Eplontersen  Sodium ([name] ) 45 MG/0.8ML SOAJ Inject 45 mg into the skin every 30 (thirty) days. 04/22/23   Rolan Ezra RAMAN, MD  FARXIGA  10 MG TABS tablet TAKE 1 TABLET(10 MG) BY MOUTH DAILY BEFORE BREAKFAST 05/21/23   Rolan Ezra RAMAN, MD  Loratadine  (CLARITIN  PO) Take 10 mg by mouth as needed.    [provider]  milrinone  (PRIMACOR ) 20 MG/100 ML SOLN infusion Inject 0.0139 mg/min into the vein continuous. Patient taking differently: Inject 0.25 mcg/kg/min into the vein daily in the afternoon. 11/16/19   Leotis Bogus, MD  Omega-3 Fatty Acids (FISH OIL) 1000 MG CAPS Take 1 capsule by mouth daily.    [provider]  potassium chloride  (KLOR-CON ) 10 MEQ tablet Take 2 tablets (20 mEq total) by mouth daily. 08/26/22   Rolan Ezra RAMAN, MD  simvastatin  (ZOCOR ) 40 MG tablet Take 1 tablet (40 mg total) by mouth daily. 07/17/23   Rolan Ezra RAMAN, MD  spironolactone  (ALDACTONE ) 25 MG tablet Take 1 tablet (25 mg total) by mouth daily. 07/24/21   Rolan Ezra RAMAN, MD  Tafamidis  (VYNDAMAX ) 61 MG CAPS Take 1 capsule by mouth daily. 12/17/22   Rolan Ezra RAMAN, MD  torsemide  (DEMADEX ) 20 MG tablet Take 1.5 tablets (30 mg total) by mouth daily. 07/17/23   Rolan Ezra RAMAN, MD  TRADJENTA  5 MG TABS tablet Take 5 mg by mouth every morning. 04/27/20   [provider]    Physical Exam: Vitals:   07/26/23 1845 07/26/23 2011 07/26/23 2015 07/26/23 2030  BP: (!) 83/52 (!) 85/56 (!) 87/61 (!) 80/62  Pulse: 64 67 70 (!) 59  Resp: 20 (!) 25 (!) 21 15  Temp:  98 F (36.7 C)    TempSrc:      SpO2: 97% 97% 96% 99%  Weight:      Height:       General:  Appears calm and comfortable and is in  NAD Eyes:  PERRL, EOMI, normal lids, iris ENT:  grossly normal hearing, lips & tongue, mmm; appropriate dentition Neck:  no LAD, masses or thyromegaly; no carotid bruits Cardiovascular:  irregularly irregular, no m/r/g. No LE edema.  Respiratory:   CTA bilaterally with no wheezes/rales/rhonchi.  Normal respiratory effort. Abdomen:  soft, NT, ND, NABS Back:   normal alignment, no CVAT Skin:  no rash or induration seen on limited exam Musculoskeletal:  grossly normal tone BUE/BLE, good ROM, no bony abnormality Lower extremity:  No LE edema.  Limited foot exam with no ulcerations.  2+ distal pulses. Psychiatric:  grossly normal mood and affect, speech fluent, but aphasic at baseline and  appropriate, AOx3 Neurologic:  CN 2-12 grossly intact, moves all extremities in coordinated fashion, sensation intact   Radiological Exams on Admission: Independently reviewed - see discussion in A/P where applicable  CT ABDOMEN PELVIS W CONTRAST Result Date: 07/26/2023 CLINICAL DATA:  Abdominal pain, acute, nonlocalized EXAM: CT ABDOMEN AND PELVIS WITH CONTRAST TECHNIQUE: Multidetector CT imaging of the abdomen and pelvis was performed using the standard protocol following bolus administration of intravenous contrast. RADIATION DOSE REDUCTION: This exam was performed according to the departmental dose-optimization program which includes automated exposure control, adjustment of the mA and/or kV according to patient size and/or use of iterative reconstruction technique. CONTRAST:  80mL OMNIPAQUE  IOHEXOL  300 MG/ML  SOLN COMPARISON:  CT abdomen pelvis 11/05/2019 FINDINGS: Lower chest: Cardiomegaly.  No acute abnormality. Hepatobiliary: The hepatic parenchyma is diffusely hypodense compared to the splenic parenchyma consistent with fatty infiltration. No focal liver abnormality. No gallstones, gallbladder wall thickening, or pericholecystic fluid. No biliary dilatation. Pancreas: No focal lesion. Normal pancreatic  contour. No surrounding inflammatory changes. No main pancreatic ductal dilatation. Spleen: Normal in size without focal abnormality. Adrenals/Urinary Tract: No adrenal nodule bilaterally. Bilateral kidneys enhance symmetrically. No hydronephrosis. No hydroureter. No nephroureterolithiasis bilaterally. Subcentimeter hypodensities are too small to characterize-no further follow-up indicated. The urinary bladder is unremarkable. No excretion of intravenous contrast on delayed view from either kidneys. Stomach/Bowel: Stomach is within normal limits. No evidence of bowel wall thickening or dilatation. Appendix appears normal. Vascular/Lymphatic: No abdominal aorta or iliac aneurysm. Moderate atherosclerotic plaque of the aorta and its branches. No abdominal, pelvic, or inguinal lymphadenopathy. Reproductive: Prostate is unremarkable. Other: No intraperitoneal free fluid. No intraperitoneal free gas. No organized fluid collection. Musculoskeletal: No abdominal wall hernia or abnormality. No suspicious lytic or blastic osseous lesions. No acute displaced fracture. Multilevel degenerative changes of the spine. IMPRESSION: 1. No acute intra-abdominal or intrapelvic abnormality. 2. No excretion of intravenous contrast on delayed view from either kidneys. Correlate with renal function test. 3. Cardiomegaly. 4. Mild hepatic steatosis. 5.  Aortic Atherosclerosis (ICD10-I70.0). Electronically Signed   By: Morgane  Naveau M.D.   On: 07/26/2023 19:27   DG Chest Portable 1 View Result Date: 07/26/2023 CLINICAL DATA:  Decreased activity.  Reported dysuria. EXAM: PORTABLE CHEST 1 VIEW COMPARISON:  06/20/2022 and older exams. FINDINGS: Mild stable enlargement of the cardiac silhouette. No mediastinal or hilar masses. Clear lungs.  No pleural effusion or pneumothorax. Right anterior chest wall central venous catheter extends to the internal jugular, tip in the mid superior vena cava. Skeletal structures are grossly intact.  IMPRESSION: No active disease. Electronically Signed   By: Alm Parkins M.D.   On: 07/26/2023 17:30    EKG: Independently reviewed.  Atrial flutter with rate 68; nonspecific ST changes with no evidence of acute ischemia   Labs on Admission: I have personally reviewed the available labs and imaging studies at the time of the admission.  Pertinent labs:    platelets 112,  sodium: 131,  BUN 28,  creatinine: 1.53,  Lactic acid: 1.1>1.2,   Assessment and Plan: Principal Problem:   Sepsis (HCC) Active Problems:   Hyponatremia   end stage NICM (nonischemic cardiomyopathy) (HCC)   Permanent atrial fibrillation (HCC)   Cardiac amyloidosis (HCC)   Controlled type 2 diabetes mellitus without complication, without long-term current use of insulin  (HCC)   CKD stage 3a, GFR 45-59 ml/min (HCC)   Essential hypertension   History of stroke   HLD (hyperlipidemia)    Assessment and Plan: * Sepsis (HCC) 67 year  old male presenting to ED with complaints of weakness, lightheadedness and dysuria found to be septic with fever to 101.2 and tachypnea with unclear source at time of admission  -obs to [facility] -given 500cc IVF due to end stage NICM -lactic acid wnl  -UA and CXR clear, urine culture pending  -CT abdomen/pelvis with no acute findings  -PICC line could be source, BC pending  -trend PCT -check RVP -received rocephin  in ED  -broaden to vanc/cefepime  for now  -blood pressure soft with no room to bolus. On continuous milirone infusion. Cardiology consulted.  -orthostatic vitals/TED hose. Hold jardiance for now  Hyponatremia Around baseline, 131-134  Received 500cc in ED End stage NICM, but is not volume overloaded  Check TSH Trend   end stage NICM (nonischemic cardiomyopathy) (HCC) Euvolemic on exam  Echo 08/2022: EF 25-30%, no LVH, moderate decreased RV systolic function, moderate MR, severe biatrial enlargement, IVC dilated.  On continuous milirone infusion  Not a transplant  candidate or LVAD candidate  Discussed with pharmacist about his home milrinone  infusion vs. In hospital. Recommended to discuss with heart failure team  Cardiology on call: continue home milrinone  infusion  Blood pressure lower than normal and likely cause of dizziness/light headedness. ? Due to sepsis vs. Worsening ends stage NICM  Strict I/O and daily weights Have consulted cardiology and transferring to [facility]  Blood pressures much softer than baseline, holding aldactone /demadex  for now   Permanent atrial fibrillation (HCC) Now permanent.  Continue eliquis   Initially in 2018, EF improved after DCCV.  However, as above, he has been relatively well rate controlled and has not been in RVR, so do not think that cardiomyopathy is primarily due to AF.  He failed DCCV on amiodarone  10/21, amiodarone  stopped due to bradycardia. 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   Cardiac amyloidosis (HCC) continue tafamidis . Continue eplontersen  with improvement in peripheral neuropathy symptoms.   Controlled type 2 diabetes mellitus without complication, without long-term current use of insulin  (HCC) A1C of 5.4 in 2021 Tradjenta  and farxiga   Holding farxiga  until orthostatics result  SSI and accuchecks QAC/HS   CKD stage 3a, GFR 45-59 ml/min (HCC) Baseline creatinine 1.4-1.5 stable  Essential hypertension Soft bp, much softer than normal.  Hold aldactone , demadex  for now   History of stroke Baseline aphasia  On eliquis /statin   HLD (hyperlipidemia) Continue zocor  40mg  daily     Advance Care Planning:   Code Status: Full Code   Consults: cardiology   DVT Prophylaxis: eliquis    Family Communication: niece at bedside   Severity of Illness: The appropriate patient status for this patient is OBSERVATION. Observation status is judged to be reasonable and necessary in order to provide the required intensity of service to ensure the patient's safety. The patient's presenting symptoms,  physical exam findings, and initial radiographic and laboratory data in the context of their medical condition is felt to place them at decreased risk for further clinical deterioration. Furthermore, it is anticipated that the patient will be medically stable for discharge from the hospital within 2 midnights of admission.   Author: Isaiah Geralds, MD 07/26/2023 9:50 PM  For on call review [email or web address].

## 2023-07-26 NOTE — Assessment & Plan Note (Signed)
 Baseline creatinine 1.4-1.5 stable

## 2023-07-26 NOTE — Assessment & Plan Note (Signed)
 continue tafamidis . Continue eplontersen  with improvement in peripheral neuropathy symptoms.

## 2023-07-26 NOTE — Assessment & Plan Note (Signed)
 Now permanent.  Continue eliquis   Initially in 2018, EF improved after DCCV.  However, as above, he has been relatively well rate controlled and has not been in RVR, so do not think that cardiomyopathy is primarily due to AF.  He failed DCCV on amiodarone  10/21, amiodarone  stopped due to bradycardia. 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

## 2023-07-26 NOTE — Assessment & Plan Note (Signed)
Continue zocor 40mg daily  

## 2023-07-26 NOTE — ED Triage Notes (Signed)
 Pt arrive with niece who reports pt has laid around all day which is not usual for him. Pt reports dysuria, also had his medication patch moved last Thursday. Pt has aphasia from prior stroke, which is not worse today.

## 2023-07-26 NOTE — Assessment & Plan Note (Signed)
 Soft bp, much softer than normal.  Hold aldactone , demadex  for now

## 2023-07-26 NOTE — Assessment & Plan Note (Addendum)
 A1C of 5.4 in 2021 Tradjenta  and farxiga   Holding farxiga  until orthostatics result  SSI and accuchecks QAC/HS

## 2023-07-26 NOTE — Assessment & Plan Note (Signed)
 Around baseline, 131-134  Received 500cc in ED End stage NICM, but is not volume overloaded  Check TSH Trend

## 2023-07-26 NOTE — Assessment & Plan Note (Addendum)
 Euvolemic on exam  Echo 08/2022: EF 25-30%, no LVH, moderate decreased RV systolic function, moderate MR, severe biatrial enlargement, IVC dilated.  On continuous milirone infusion  Not a transplant candidate or LVAD candidate  Discussed with pharmacist about his home milrinone  infusion vs. In hospital. Recommended to discuss with heart failure team  Cardiology on call: continue home milrinone  infusion  Blood pressure lower than normal and likely cause of dizziness/light headedness. ? Due to sepsis vs. Worsening ends stage NICM  Strict I/O and daily weights Have consulted cardiology and transferring to [facility]  Blood pressures much softer than baseline, holding aldactone /demadex  for now

## 2023-07-26 NOTE — Assessment & Plan Note (Addendum)
 68 year old male presenting to ED with complaints of weakness, lightheadedness and dysuria found to be septic with fever to 101.2 and tachypnea with unclear source at time of admission  -obs to [facility] -given 500cc IVF due to end stage NICM -lactic acid wnl  -UA and CXR clear, urine culture pending  -CT abdomen/pelvis with no acute findings  -PICC line could be source, BC pending  -trend PCT -check RVP -received rocephin  in ED  -broaden to vanc/cefepime  for now  -blood pressure soft with no room to bolus. On continuous milirone infusion. Cardiology consulted.  -orthostatic vitals/TED hose. Hold jardiance for now

## 2023-07-26 NOTE — Progress Notes (Signed)
 Pharmacy Antibiotic Note  [name] is a 68 y.o. male admitted on 07/26/2023 with sepsis.  Pharmacy has been consulted for vancomycin  & cefepime  dosing.  Plan: Cefepime  2 gm IV q12 hours  Vancomycin  1250 mg IV x 1 then vancomycin  750 mg IV q24 for eAUC 516.3 using SCr 1.53, TBW (TBW < IBW)   Height: 5' 7 (170.2 cm) Weight: 56.7 kg (125 lb) IBW/kg (Calculated) : 66.1  Temp (24hrs), Avg:99 F (37.2 C), Min:97.9 F (36.6 C), Max:101.2 F (38.4 C)  Recent Labs  Lab 07/26/23 1613 07/26/23 1623 07/26/23 1901  WBC 8.9  --   --   CREATININE 1.53*  --   --   LATICACIDVEN  --  1.1 1.2    Estimated Creatinine Clearance: 37.6 mL/min (A) (by C-G formula based on SCr of 1.53 mg/dL (H)).    No Known Allergies  Thank you for allowing pharmacy to be a part of this patient's care.  Rosaline IVAR Edison, Pharm.D Use secure chat for questions 07/26/2023 8:49 PM

## 2023-07-26 NOTE — Assessment & Plan Note (Addendum)
 Baseline aphasia  On eliquis chrystie

## 2023-07-27 ENCOUNTER — Encounter (HOSPITAL_COMMUNITY): Payer: Self-pay | Admitting: Family Medicine

## 2023-07-27 DIAGNOSIS — I959 Hypotension, unspecified: Secondary | ICD-10-CM | POA: Diagnosis present

## 2023-07-27 DIAGNOSIS — Z7901 Long term (current) use of anticoagulants: Secondary | ICD-10-CM | POA: Diagnosis not present

## 2023-07-27 DIAGNOSIS — I4821 Permanent atrial fibrillation: Secondary | ICD-10-CM

## 2023-07-27 DIAGNOSIS — R300 Dysuria: Secondary | ICD-10-CM | POA: Diagnosis present

## 2023-07-27 DIAGNOSIS — I4892 Unspecified atrial flutter: Secondary | ICD-10-CM | POA: Diagnosis present

## 2023-07-27 DIAGNOSIS — E1122 Type 2 diabetes mellitus with diabetic chronic kidney disease: Secondary | ICD-10-CM | POA: Diagnosis present

## 2023-07-27 DIAGNOSIS — Z95828 Presence of other vascular implants and grafts: Secondary | ICD-10-CM | POA: Diagnosis not present

## 2023-07-27 DIAGNOSIS — I428 Other cardiomyopathies: Secondary | ICD-10-CM

## 2023-07-27 DIAGNOSIS — R6510 Systemic inflammatory response syndrome (SIRS) of non-infectious origin without acute organ dysfunction: Secondary | ICD-10-CM | POA: Diagnosis present

## 2023-07-27 DIAGNOSIS — I69320 Aphasia following cerebral infarction: Secondary | ICD-10-CM | POA: Diagnosis not present

## 2023-07-27 DIAGNOSIS — E854 Organ-limited amyloidosis: Secondary | ICD-10-CM | POA: Diagnosis present

## 2023-07-27 DIAGNOSIS — N1831 Chronic kidney disease, stage 3a: Secondary | ICD-10-CM | POA: Diagnosis present

## 2023-07-27 DIAGNOSIS — I43 Cardiomyopathy in diseases classified elsewhere: Secondary | ICD-10-CM | POA: Diagnosis present

## 2023-07-27 DIAGNOSIS — Z7984 Long term (current) use of oral hypoglycemic drugs: Secondary | ICD-10-CM | POA: Diagnosis not present

## 2023-07-27 DIAGNOSIS — E785 Hyperlipidemia, unspecified: Secondary | ICD-10-CM | POA: Diagnosis present

## 2023-07-27 DIAGNOSIS — I130 Hypertensive heart and chronic kidney disease with heart failure and stage 1 through stage 4 chronic kidney disease, or unspecified chronic kidney disease: Secondary | ICD-10-CM | POA: Diagnosis present

## 2023-07-27 DIAGNOSIS — E1140 Type 2 diabetes mellitus with diabetic neuropathy, unspecified: Secondary | ICD-10-CM | POA: Diagnosis present

## 2023-07-27 DIAGNOSIS — Z1152 Encounter for screening for COVID-19: Secondary | ICD-10-CM | POA: Diagnosis not present

## 2023-07-27 DIAGNOSIS — R531 Weakness: Secondary | ICD-10-CM | POA: Diagnosis present

## 2023-07-27 DIAGNOSIS — A419 Sepsis, unspecified organism: Secondary | ICD-10-CM | POA: Diagnosis not present

## 2023-07-27 DIAGNOSIS — I10 Essential (primary) hypertension: Secondary | ICD-10-CM

## 2023-07-27 DIAGNOSIS — E871 Hypo-osmolality and hyponatremia: Secondary | ICD-10-CM | POA: Diagnosis present

## 2023-07-27 DIAGNOSIS — I2510 Atherosclerotic heart disease of native coronary artery without angina pectoris: Secondary | ICD-10-CM | POA: Diagnosis present

## 2023-07-27 DIAGNOSIS — I69351 Hemiplegia and hemiparesis following cerebral infarction affecting right dominant side: Secondary | ICD-10-CM | POA: Diagnosis not present

## 2023-07-27 DIAGNOSIS — I5022 Chronic systolic (congestive) heart failure: Secondary | ICD-10-CM | POA: Diagnosis present

## 2023-07-27 DIAGNOSIS — Z8249 Family history of ischemic heart disease and other diseases of the circulatory system: Secondary | ICD-10-CM | POA: Diagnosis not present

## 2023-07-27 LAB — GLUCOSE, CAPILLARY
Glucose-Capillary: 101 mg/dL — ABNORMAL HIGH (ref 70–99)
Glucose-Capillary: 82 mg/dL (ref 70–99)
Glucose-Capillary: 126 mg/dL — ABNORMAL HIGH (ref 70–99)
Glucose-Capillary: 148 mg/dL — ABNORMAL HIGH (ref 70–99)

## 2023-07-27 LAB — BASIC METABOLIC PANEL WITH GFR
Anion gap: 10 (ref 5–15)
BUN: 25 mg/dL — ABNORMAL HIGH (ref 8–23)
CO2: 24 mmol/L (ref 22–32)
Calcium: 8.8 mg/dL — ABNORMAL LOW (ref 8.9–10.3)
Chloride: 99 mmol/L (ref 98–111)
Creatinine, Ser: 1.45 mg/dL — ABNORMAL HIGH (ref 0.61–1.24)
GFR, Estimated: 53 mL/min — ABNORMAL LOW (ref >60)
Glucose, Bld: 91 mg/dL (ref 70–99)
Potassium: 3.7 mmol/L (ref 3.5–5.1)
Sodium: 133 mmol/L — ABNORMAL LOW (ref 135–145)

## 2023-07-27 LAB — CBC
HCT: 36.3 % — ABNORMAL LOW (ref 39.0–52.0)
Hemoglobin: 11.9 g/dL — ABNORMAL LOW (ref 13.0–17.0)
MCH: 29.8 pg (ref 26.0–34.0)
MCHC: 32.8 g/dL (ref 30.0–36.0)
MCV: 91 fL (ref 80.0–100.0)
Platelets: 101 K/uL — ABNORMAL LOW (ref 150–400)
RBC: 3.99 MIL/uL — ABNORMAL LOW (ref 4.22–5.81)
RDW: 14.3 % (ref 11.5–15.5)
WBC: 7.7 K/uL (ref 4.0–10.5)
nRBC: 0 % (ref 0.0–0.2)

## 2023-07-27 LAB — TSH: TSH: 1.292 u[IU]/mL (ref 0.350–4.500)

## 2023-07-27 LAB — HEMOGLOBIN A1C
Hgb A1c MFr Bld: 6 % — ABNORMAL HIGH (ref 4.8–5.6)
Mean Plasma Glucose: 125.5 mg/dL

## 2023-07-27 LAB — RESPIRATORY PANEL BY PCR

## 2023-07-27 LAB — URINE CULTURE: Culture: <10000 — AB

## 2023-07-27 LAB — MAGNESIUM: Magnesium: 2.3 mg/dL (ref 1.7–2.4)

## 2023-07-27 LAB — PROCALCITONIN
Procalcitonin: 12.28 ng/mL
Procalcitonin: 11.36 ng/mL

## 2023-07-27 MED ORDER — CHLORHEXIDINE GLUCONATE CLOTH 2 % EX PADS
6.0000 | MEDICATED_PAD | Freq: Every day | CUTANEOUS | Status: DC
  Administered 2023-07-27 – 2023-07-29 (×3): 6 via TOPICAL

## 2023-07-27 MED ORDER — MILRINONE LACTATE 20 MG/20ML IV SOLN
0.1250 ug/kg/min | INTRAVENOUS | Status: DC
  Administered 2023-07-27: 0.125 ug/kg/min via INTRAVENOUS

## 2023-07-27 MED ORDER — SODIUM CHLORIDE 0.9% FLUSH: 10.0000 mL | INTRAVENOUS | Status: DC | PRN

## 2023-07-27 NOTE — Care Management Obs Status (Signed)
 MEDICARE OBSERVATION STATUS NOTIFICATION   Patient Details  Name: [name] MRN: [id number] Date of Birth: 03-15-1955   Medicare Observation Status Notification Given:  Yes    Marval Gell, RN 07/27/2023, 3:41 PM

## 2023-07-27 NOTE — Plan of Care (Signed)

## 2023-07-27 NOTE — Progress Notes (Signed)
 Mobility Specialist Progress Note;   07/27/23 1133  Mobility  Activity Ambulated with assistance in hallway  Level of Assistance Minimal assist, patient does 75% or more  Assistive Device Four point cane  Distance Ambulated (ft) 150 ft  Activity Response Tolerated well  Mobility Referral Yes  Mobility visit 1 Mobility  Mobility Specialist Start Time (ACUTE ONLY) 1133  Mobility Specialist Stop Time (ACUTE ONLY) 1144  Mobility Specialist Time Calculation (min) (ACUTE ONLY) 11 min   RN requesting pt to ambulate, pt agreeable. Required MinA during ambulation d/t staggered gait and R sided weakness. HR up to 130 bpm but recovered quickly w/ standing rest break. No c/o when asked. Pt returned back to bed with all needs met, call bell in reach. Alarm on. Visitor present.   Lauraine Erm Mobility Specialist Please contact via SecureChat or [employer] [phone]

## 2023-07-27 NOTE — Discharge Instructions (Signed)

## 2023-07-27 NOTE — Progress Notes (Signed)
 PROGRESS NOTE    [name]  FMW:[id number] DOB: 04-16-1955 DOA: 07/26/2023 PCP: Alec House, MD   Brief Narrative:  HPI: [name] is a 68 y.o. male with medical history significant of hx of CVA with aphasia,end stage NICM, HLD, T2DM, permanent atrial fibrillation who presented to ED with complaints of weakness. His niece gives history. This morning she could tell he was off. He didn't feel great. She checked his vitals and his blood pressure was 87/70 which she says was low for him. He usually runs around 100/70. He stayed in bed all morning and this is not like him at all. He told his niece he felt light headed and dizzy. He also stated he had some pain with urination. No fevers/chills at home. He had PICC line placed last week in his chest. His PICC line in his arm had come out and he needed a new PICC line placed.    He has no urinary urgency or frequency and has no problems urinating. He does endorse some dysuria and some supra pubic pain. Urine looks normal and has no odor.       Denies any fever/chills, vision changes/headaches, chest pain or palpitations, shortness of breath or cough, abdominal pain, N/V/D, or leg swelling.    He does not smoke or drink alcohol.      ER Course:  vitals: temp: 101.2, bp: 95/67, HR: 79, RR: 18>25, oxygen: 96%RA Pertinent labs: platelets 112, sodium: 131, BUN 28, creatinine: 1.53,  Lactic acid: 1.1>1.2,  CXR: no acute finding CT abdomen/pelvis: no acute finding. No excretion of IV contrast on delayed view from either kidneys. Correlate with renal function test. Cardiomegaly. Mild hepatic steatosis, aortic atherosclerosis.  In ED: given 500cc IVF bolus, BC and urine culture obtained. Rocpehin given. [facility] asked to admit.   Assessment & Plan:   Principal Problem:   Sepsis (HCC) Active Problems:   Hyponatremia   end stage NICM (nonischemic cardiomyopathy) (HCC)   Permanent atrial fibrillation (HCC)   Cardiac amyloidosis (HCC)    Controlled type 2 diabetes mellitus without complication, without long-term current use of insulin  (HCC)   CKD stage 3a, GFR 45-59 ml/min (HCC)   Essential hypertension   History of stroke   HLD (hyperlipidemia)  For now sepsis ruled out since there is no clear source of infection.  SIRS ruled in, POA: 68 year old male presenting to ED with complaints of weakness, lightheadedness and dysuria found to be septic with fever to 101.2 and tachypnea with unclear source at time of admission.  Workup negative so far including CT abdomen and pelvis, chest x-ray as well as UA.  No leukocytosis.  Last temperature spike of 101.2 At 2:30 PM 07/26/2023.  PICC line is suspected source as this was replaced a week ago, cultures are negative, we will follow along.  Patient has been started on cefepime  and vancomycin  which we will continue.  Will check procalcitonin.  Chronic hyponatremia Around baseline, 131-134, at baseline.   end stage NICM (nonischemic cardiomyopathy) (HCC) Echo 08/2022: EF 25-30%, no LVH, moderate decreased RV systolic function, moderate MR, severe biatrial enlargement, IVC dilated. On continuous home milirone infusion, Not a transplant candidate or LVAD candidate, cardiology on-call was consulted and they recommended continuing home milrinone  infusion with no changes. Blood pressures much softer than baseline, holding aldactone /demadex  for now.  Per admitting hospitalist, cardiology is consulted.  I have sent a message to cardiologist on-call/Dr. C.   Permanent atrial fibrillation (HCC) Now permanent.  Continue eliquis   Initially  in 2018, EF improved after DCCV.  However, as above, he has been relatively well rate controlled and has not been in RVR, so do not think that cardiomyopathy is primarily due to AF.  He failed DCCV on amiodarone  10/21, amiodarone  stopped due to bradycardia. 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    Cardiac amyloidosis (HCC) continue tafamidis . Continue  eplontersen  with improvement in peripheral neuropathy symptoms.    Controlled type 2 diabetes mellitus without complication, without long-term current use of insulin  (HCC) A1C of 5.4 in 2021 Tradjenta  and farxiga   Holding farxiga  until orthostatics result  SSI and accuchecks QAC/HS    CKD stage 3a, GFR 45-59 ml/min (HCC) Baseline creatinine 1.4-1.5 stable   Essential hypertension Soft bp, much softer than normal.  Hold aldactone , demadex  for now    History of stroke Baseline aphasia with right hemiparesis, stable. On eliquis /statin    HLD (hyperlipidemia) Continue zocor  40mg  daily     DVT prophylaxis: apixaban  (ELIQUIS ) tablet 5 mg Start: 07/26/23 2300 Place TED hose Start: 07/26/23 2139   Code Status: Full Code  Family Communication: Niece present at bedside.  Plan of care discussed with patient in length and he/she verbalized understanding and agreed with it.  Status is: Observation The patient will require care spanning > 2 midnights and should be moved to inpatient because: Need to remain in the hospital until source of infection is identified.   Estimated body mass index is 20.41 kg/m as calculated from the following:   Height as of this encounter: 5' 7 (1.702 m).   Weight as of this encounter: 59.1 kg.    Nutritional Assessment: Body mass index is 20.41 kg/m.SABRA Seen by dietician.  I agree with the assessment and plan as outlined below: Nutrition Status:        . Skin Assessment: I have examined the patient's skin and I agree with the wound assessment as performed by the wound care RN as outlined below:    Consultants:  Cardiology  Procedures:  None  Antimicrobials:  Anti-infectives (From admission, onward)    Start     Dose/Rate Route Frequency Ordered Stop   07/27/23 2200  vancomycin  (VANCOREADY) IVPB 750 mg/150 mL        750 mg 150 mL/hr over 60 Minutes Intravenous Every 24 hours 07/26/23 2050     07/26/23 2200  ceFEPIme  (MAXIPIME ) 2 g in  sodium chloride  0.9 % 100 mL IVPB        2 g 200 mL/hr over 30 Minutes Intravenous Every 12 hours 07/26/23 2044     07/26/23 2200  vancomycin  (VANCOREADY) IVPB 1250 mg/250 mL        1,250 mg 166.7 mL/hr over 90 Minutes Intravenous  Once 07/26/23 2045 07/27/23 0334   07/26/23 2000  cefTRIAXone  (ROCEPHIN ) 1 g in sodium chloride  0.9 % 100 mL IVPB        1 g 200 mL/hr over 30 Minutes Intravenous  Once 07/26/23 1948 07/26/23 2133         Subjective: Patient seen and examined, patient aphasic, niece at the bedside helping with communication.  Patient appears calm and comfortable.  According to her, he is feeling much better.  He has no complaints at the moment.  Nurse at the bedside.  Objective: Vitals:   07/26/23 2235 07/27/23 0312 07/27/23 0710 07/27/23 0759  BP: (!) 88/58 106/72  102/72  Pulse: 76 80  87  Resp: 16 20  20   Temp: 98.3 F (36.8 C) 97.6 F (36.4 C)  97.9 F (36.6 C)  TempSrc: Oral Oral  Oral  SpO2: 100% 98%  99%  Weight: 59.8 kg  59.1 kg   Height:        Intake/Output Summary (Last 24 hours) at 07/27/2023 1007 Last data filed at 07/27/2023 0900 Gross per 24 hour  Intake 590 ml  Output 550 ml  Net 40 ml   Filed Weights   07/26/23 1431 07/26/23 2235 07/27/23 0710  Weight: 56.7 kg 59.8 kg 59.1 kg    Examination:  General exam: Appears calm and comfortable  Respiratory system: Clear to auscultation. Respiratory effort normal. Cardiovascular system: S1 & S2 heard, RRR. No JVD, murmurs, rubs, gallops or clicks. No pedal edema. Gastrointestinal system: Abdomen is nondistended, soft and nontender. No organomegaly or masses felt. Normal bowel sounds heard. Central nervous system: Alert and oriented.  Right hemiparesis from previous stroke. Extremities: Symmetric 5 x 5 power. Skin: No rashes, lesions or ulcers  Data Reviewed: I have personally reviewed following labs and imaging studies  CBC: Recent Labs  Lab 07/26/23 1613 07/27/23 0100  WBC 8.9 7.7   NEUTROABS 7.9*  --   HGB 13.3 11.9*  HCT 41.5 36.3*  MCV 91.0 91.0  PLT 112* 101*   Basic Metabolic Panel: Recent Labs  Lab 07/26/23 1613 07/27/23 0100  NA 131* 133*  K 4.0 3.7  CL 97* 99  CO2 24 24  GLUCOSE 129* 91  BUN 28* 25*  CREATININE 1.53* 1.45*  CALCIUM 9.3 8.8*   GFR: Estimated Creatinine Clearance: 41.3 mL/min (A) (by C-G formula based on SCr of 1.45 mg/dL (H)). Liver Function Tests: Recent Labs  Lab 07/26/23 1613  AST 57*  ALT 40  ALKPHOS 70  BILITOT 1.6*  PROT 8.0  ALBUMIN 4.2   No results for input(s): LIPASE, AMYLASE in the last 168 hours. No results for input(s): AMMONIA in the last 168 hours. Coagulation Profile: No results for input(s): INR, PROTIME in the last 168 hours. Cardiac Enzymes: No results for input(s): CKTOTAL, CKMB, CKMBINDEX, TROPONINI in the last 168 hours. BNP (last 3 results) No results for input(s): PROBNP in the last 8760 hours. HbA1C: No results for input(s): HGBA1C in the last 72 hours. CBG: Recent Labs  Lab 07/27/23 0601  GLUCAP 101*   Lipid Profile: No results for input(s): CHOL, HDL, LDLCALC, TRIG, CHOLHDL, LDLDIRECT in the last 72 hours. Thyroid Function Tests: No results for input(s): TSH, T4TOTAL, FREET4, T3FREE, THYROIDAB in the last 72 hours. Anemia Panel: No results for input(s): VITAMINB12, FOLATE, FERRITIN, TIBC, IRON, RETICCTPCT in the last 72 hours. Sepsis Labs: Recent Labs  Lab 07/26/23 1623 07/26/23 1901 07/27/23 0100  PROCALCITON  --   --  12.28  LATICACIDVEN 1.1 1.2  --     Recent Results (from the past 240 hours)  Culture, blood (routine x 2)     Status: None (Preliminary result)   Collection Time: 07/26/23  4:13 PM   Specimen: BLOOD LEFT ARM  Result Value Ref Range Status   Specimen Description   Final    BLOOD LEFT ARM Performed at [facility], [address] [place], [place] [address]    Special Requests   Final     BOTTLES DRAWN AEROBIC AND ANAEROBIC Blood Culture adequate volume Performed at [facility], [address] [place], [place] [address]    Culture   Final    NO GROWTH < 12 HOURS Performed at [facility], [address] [place], [place] [address]  Report Status PENDING  Incomplete  Resp panel by RT-PCR (RSV, Flu A&B, Covid) Anterior Nasal Swab     Status: None   Collection Time: 07/26/23  5:45 PM   Specimen: Anterior Nasal Swab  Result Value Ref Range Status   SARS Coronavirus 2 by RT PCR NEGATIVE NEGATIVE Final    Comment: (NOTE) SARS-CoV-2 target nucleic acids are NOT DETECTED.  The SARS-CoV-2 RNA is generally detectable in upper respiratory specimens during the acute phase of infection. The lowest concentration of SARS-CoV-2 viral copies this assay can detect is 138 copies/mL. A negative result does not preclude SARS-Cov-2 infection and should not be used as the sole basis for treatment or other patient management decisions. A negative result may occur with  improper specimen collection/handling, submission of specimen other than nasopharyngeal swab, presence of viral mutation(s) within the areas targeted by this assay, and inadequate number of viral copies(<138 copies/mL). A negative result must be combined with clinical observations, patient history, and epidemiological information. The expected result is Negative.  Fact Sheet for Patients:  BloggerCourse.com  Fact Sheet for Healthcare Providers:  SeriousBroker.it  This test is no t yet approved or cleared by the [place]  FDA and  has been authorized for detection and/or diagnosis of SARS-CoV-2 by FDA under an Emergency Use Authorization (EUA). This EUA will remain  in effect (meaning this test can be used) for the duration of the COVID-19 declaration under Section 564(b)(1) of the Act, 21 U.S.C.section 360bbb-3(b)(1), unless the  authorization is terminated  or revoked sooner.       Influenza A by PCR NEGATIVE NEGATIVE Final   Influenza B by PCR NEGATIVE NEGATIVE Final    Comment: (NOTE) The Xpert Xpress SARS-CoV-2/FLU/RSV plus assay is intended as an aid in the diagnosis of influenza from Nasopharyngeal swab specimens and should not be used as a sole basis for treatment. Nasal washings and aspirates are unacceptable for Xpert Xpress SARS-CoV-2/FLU/RSV testing.  Fact Sheet for Patients: BloggerCourse.com  Fact Sheet for Healthcare Providers: SeriousBroker.it  This test is not yet approved or cleared by the [place]  FDA and has been authorized for detection and/or diagnosis of SARS-CoV-2 by FDA under an Emergency Use Authorization (EUA). This EUA will remain in effect (meaning this test can be used) for the duration of the COVID-19 declaration under Section 564(b)(1) of the Act, 21 U.S.C. section 360bbb-3(b)(1), unless the authorization is terminated or revoked.     Resp Syncytial Virus by PCR NEGATIVE NEGATIVE Final    Comment: (NOTE) Fact Sheet for Patients: BloggerCourse.com  Fact Sheet for Healthcare Providers: SeriousBroker.it  This test is not yet approved or cleared by the [place]  FDA and has been authorized for detection and/or diagnosis of SARS-CoV-2 by FDA under an Emergency Use Authorization (EUA). This EUA will remain in effect (meaning this test can be used) for the duration of the COVID-19 declaration under Section 564(b)(1) of the Act, 21 U.S.C. section 360bbb-3(b)(1), unless the authorization is terminated or revoked.  Performed at [facility], [address] [place], [place] [address]   Respiratory (~20 pathogens) panel by PCR     Status: None   Collection Time: 07/26/23  8:28 PM   Specimen: Nasopharyngeal Swab; Respiratory  Result Value Ref Range  Status   Adenovirus NOT DETECTED NOT DETECTED Final   Coronavirus 229E NOT DETECTED NOT DETECTED Final    Comment: (NOTE) The Coronavirus on the Respiratory Panel, DOES NOT test for the novel  Coronavirus (2019 nCoV)  Coronavirus HKU1 NOT DETECTED NOT DETECTED Final   Coronavirus NL63 NOT DETECTED NOT DETECTED Final   Coronavirus OC43 NOT DETECTED NOT DETECTED Final   Metapneumovirus NOT DETECTED NOT DETECTED Final   Rhinovirus / Enterovirus NOT DETECTED NOT DETECTED Final   Influenza A NOT DETECTED NOT DETECTED Final   Influenza B NOT DETECTED NOT DETECTED Final   Parainfluenza Virus 1 NOT DETECTED NOT DETECTED Final   Parainfluenza Virus 2 NOT DETECTED NOT DETECTED Final   Parainfluenza Virus 3 NOT DETECTED NOT DETECTED Final   Parainfluenza Virus 4 NOT DETECTED NOT DETECTED Final   Respiratory Syncytial Virus NOT DETECTED NOT DETECTED Final   Bordetella pertussis NOT DETECTED NOT DETECTED Final   Bordetella Parapertussis NOT DETECTED NOT DETECTED Final   Chlamydophila pneumoniae NOT DETECTED NOT DETECTED Final   Mycoplasma pneumoniae NOT DETECTED NOT DETECTED Final    Comment: Performed at [facility], [address], [place], [place] [address]     Radiology Studies: CT ABDOMEN PELVIS W CONTRAST Result Date: 07/26/2023 CLINICAL DATA:  Abdominal pain, acute, nonlocalized EXAM: CT ABDOMEN AND PELVIS WITH CONTRAST TECHNIQUE: Multidetector CT imaging of the abdomen and pelvis was performed using the standard protocol following bolus administration of intravenous contrast. RADIATION DOSE REDUCTION: This exam was performed according to the departmental dose-optimization program which includes automated exposure control, adjustment of the mA and/or kV according to patient size and/or use of iterative reconstruction technique. CONTRAST:  80mL OMNIPAQUE  IOHEXOL  300 MG/ML  SOLN COMPARISON:  CT abdomen pelvis 11/05/2019 FINDINGS: Lower chest: Cardiomegaly.  No acute abnormality.  Hepatobiliary: The hepatic parenchyma is diffusely hypodense compared to the splenic parenchyma consistent with fatty infiltration. No focal liver abnormality. No gallstones, gallbladder wall thickening, or pericholecystic fluid. No biliary dilatation. Pancreas: No focal lesion. Normal pancreatic contour. No surrounding inflammatory changes. No main pancreatic ductal dilatation. Spleen: Normal in size without focal abnormality. Adrenals/Urinary Tract: No adrenal nodule bilaterally. Bilateral kidneys enhance symmetrically. No hydronephrosis. No hydroureter. No nephroureterolithiasis bilaterally. Subcentimeter hypodensities are too small to characterize-no further follow-up indicated. The urinary bladder is unremarkable. No excretion of intravenous contrast on delayed view from either kidneys. Stomach/Bowel: Stomach is within normal limits. No evidence of bowel wall thickening or dilatation. Appendix appears normal. Vascular/Lymphatic: No abdominal aorta or iliac aneurysm. Moderate atherosclerotic plaque of the aorta and its branches. No abdominal, pelvic, or inguinal lymphadenopathy. Reproductive: Prostate is unremarkable. Other: No intraperitoneal free fluid. No intraperitoneal free gas. No organized fluid collection. Musculoskeletal: No abdominal wall hernia or abnormality. No suspicious lytic or blastic osseous lesions. No acute displaced fracture. Multilevel degenerative changes of the spine. IMPRESSION: 1. No acute intra-abdominal or intrapelvic abnormality. 2. No excretion of intravenous contrast on delayed view from either kidneys. Correlate with renal function test. 3. Cardiomegaly. 4. Mild hepatic steatosis. 5.  Aortic Atherosclerosis (ICD10-I70.0). Electronically Signed   By: Morgane  Naveau M.D.   On: 07/26/2023 19:27   DG Chest Portable 1 View Result Date: 07/26/2023 CLINICAL DATA:  Decreased activity.  Reported dysuria. EXAM: PORTABLE CHEST 1 VIEW COMPARISON:  06/20/2022 and older exams. FINDINGS:  Mild stable enlargement of the cardiac silhouette. No mediastinal or hilar masses. Clear lungs.  No pleural effusion or pneumothorax. Right anterior chest wall central venous catheter extends to the internal jugular, tip in the mid superior vena cava. Skeletal structures are grossly intact. IMPRESSION: No active disease. Electronically Signed   By: Alm Parkins M.D.   On: 07/26/2023 17:30    Scheduled Meds:  apixaban   5  mg Oral BID   insulin  aspart  0-9 Units Subcutaneous TID WC   linagliptin   5 mg Oral q morning   potassium chloride   20 mEq Oral Daily   simvastatin   40 mg Oral q1800   Tafamidis   1 capsule Oral Daily   Continuous Infusions:  ceFEPime  (MAXIPIME ) IV 2 g (07/27/23 0951)   milrinone  0.125 mcg/kg/min (07/27/23 0038)   vancomycin        LOS: 0 days   Fredia Skeeter, MD [facility]  07/27/2023, 10:07 AM   *Please note that this is a verbal dictation therefore any spelling or grammatical errors are due to the Dragon Medical One system interpretation.  Please page via Amion and do not message via secure chat for urgent patient care matters. Secure chat can be used for non urgent patient care matters.  How to contact the [facility] Attending or Consulting provider 7A - 7P or covering provider during after hours 7P -7A, for this patient?  Check the care team in [facility] and look for a) attending/consulting [facility] provider listed and b) the [facility] team listed. Page or secure chat 7A-7P. Log into [email or web address] and use [place]'s universal password to access. If you do not have the password, please contact the hospital operator. Locate the [facility] provider you are looking for under [facility] and page to a number that you can be directly reached. If you still have difficulty reaching the provider, please page the [facility] (Director on Call) for the Hospitalists listed on amion for assistance.

## 2023-07-27 NOTE — Consult Note (Addendum)
 Cardiology Consultation   Patient ID: [name] MRN: [id number]; DOB: Apr 21, 1955  Admit date: 07/26/2023 Date of Consult: 07/27/2023  PCP:  Christopher House, MD   [facility] Providers Cardiologist:  Christopher Cash, MD  Advanced Heart Failure:  Christopher Shuck, MD        Patient Profile: [name] is a 68 y.o. 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  History of Present Illness: [name] has been followed closely by the Heart Failure team.   He was seen 07/02, info from that note is below.  Patient had CVA in 2010, since that time has had expressive aphasia and right-sided weakness.  He lives with his brother.  In 8/18, he was admited with new atrial flutter and EF was found to be 20%.  He had cath in 8/18 showing mild nonobstructive coronary disease.  In 10/18, he had DCCV to NSR.  In 11/18 while in NSR, echo showed EF up to 45-50%.  By 2019, he was back in atrial fibrillation.  He was not cardioverted again. Echo in 4/21 showed EF 20-25%.     Patient was admitted in 10/21 with h/o increased dyspnea with exertion and developed abdominal swelling.  Failed outpatient diuretic titration. Admitted for IV diuretics. Echo repeated and EF was 20% with mod-severe RV dilation and moderate RV dysfunction, D-shaped septum, dilated IVC, severe biatrial enlargement.  He was in atrial fibrillation in 50s-60s. CT of abdomen showed large volume ascites, had paracentesis with 4.8 L out.  Cardiac MRI showed LV EF 24%, RV mildly dilated with EF 42%, extensive LGE in a non-coronary distribution throughout much of the LV, also involving RV free wall and left and right atrial walls, ECV 42%. This was suggestive of infiltrative disease. 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  He was initially on milrinone  for low output HF, this was weaned off for DCCV which he failed. RHC 11/11/19 off milrinone  showed optimized filling pressures but low cardiac output, milrinone  restarted at 0.125. Discharged w/ home milrinone  + home health.  He remained in atrial fibrillation.  He was not thought to be a candidate for LVAD.    Echo in 6/22 showd EF 25-30%, mild LV dilation, mild LVH, moderately decreased RV systolic function, IVC dilated, PASP 49 mmHg.  Echo in 6/23 showed EF 25-30%, global hypokinesis, mild LV dilation, moderately decreased RV systolic function, severe biatrial enlargement, mild MR.    Echo 8/24 EF 25-30%, no LVH, moderate decreased RV systolic function, moderate MR, severe biatrial enlargement, IVC dilated.    Today (7/02) he returns for HF follow up with his niece. Niece provides most of history due to patient's aphasia. Overall feeling fine. He walks with a cane due to CVA and knee OA. He is not SOB walking on flat ground. Denies palpitations, abnormal bleeding, CP, dizziness, edema, or PND/Orthopnea. Appetite ok. Taking all medications. No issues with milrinone  pump. Had recent vacation in [place], going to the beach and zoo later this summer.  Recent events:  On 07/10, PICC line was pulled out about 6 cm, meds were going in w/out problem. The plan was to use a peripheral IV, but he ended up getting  a central line placed upper R chest  Admitted 07/19 with dysuria, general malaise for about a day.  Until today admission, he had been at baseline as far as respiratory status, activity level and weight.  Admitted for sepsis as temp was 101.2, but no clear cause of fever based on lab and CT abd results. UA w/ gluc >500 but o/w negative. Lactic acid nl. Urine and blood Cx >> no results so far. SBP 80s and got gentle IVF but  BP has remained low. Cards asked to see.   His blood pressure has come up, he now feels back to his baseline.  No fevers since admission.  He had problems tolerating the vancomycin  IV due to burning and discomfort at the IV site.   Past Medical History:  Diagnosis Date   Atrial flutter (HCC)    CHF (congestive heart failure) (HCC)    Diabetes mellitus    Hypertension    NICM (nonischemic cardiomyopathy) (HCC) 08/2016   Mild, nonobstructive CAD at cath, EF initially 20%, improved to 50% 11/2016 echo   Stroke [facility])     Past Surgical History:  Procedure Laterality Date   CARDIOVERSION N/A 10/10/2016   Procedure: CARDIOVERSION;  Surgeon: Christopher Redell RAMAN, MD;  Location: [facility] ENDOSCOPY;  Service: Cardiovascular;  Laterality: N/A;   CARDIOVERSION N/A 11/10/2019   Procedure: CARDIOVERSION;  Surgeon: Christopher Christopher RAMAN, MD;  Location: [facility] ENDOSCOPY;  Service: Cardiovascular;  Laterality: N/A;   IR CV LINE INJECTION  07/18/2023   IR FLUORO GUIDE CV LINE RIGHT  07/18/2023   IR FLUOROGUIDE CV LINE NO REPORT  07/15/2022   IR US  GUIDE VASC ACCESS RIGHT  07/18/2023   RIGHT HEART CATH N/A 11/11/2019   Procedure: RIGHT HEART CATH;  Surgeon: Christopher Christopher RAMAN, MD;  Location: [name];  Service: Cardiovascular;  Laterality: N/A;   RIGHT/LEFT HEART CATH AND CORONARY ANGIOGRAPHY N/A 09/06/2016   Procedure: RIGHT/LEFT HEART CATH AND CORONARY ANGIOGRAPHY;  Surgeon: Christopher Debby LABOR, MD;  Location: [facility] INVASIVE CV LAB;  Service: Cardiovascular;  Laterality: N/A;   TRACHEOSTOMY       Home Medications:  Prior to Admission medications   Medication Sig Start Date End Date Taking? Authorizing Provider  Accu-Chek FastClix Lancets MISC 1 each by Other route as directed. 08/19/19   [provider]  ACCU-CHEK GUIDE test strip 1 each by Other route See admin instructions. 09/16/19   [provider]  acetaminophen  (TYLENOL ) 500 MG tablet Take 1,000 mg by mouth every 6 (six) hours as needed for  pain. Patient taking differently: Take 1,000 mg by mouth as needed for pain.    [provider]  ELIQUIS  5 MG TABS tablet TAKE 1 TABLET(5 MG) BY MOUTH TWICE DAILY 12/18/21   McLean, Dalton S, MD  Eplontersen  Sodium ([name] ) 45 MG/0.8ML SOAJ Inject 45 mg into the skin every 30 (thirty) days. 04/22/23   Christopher Christopher RAMAN, MD  FARXIGA  10 MG TABS tablet TAKE 1 TABLET(10 MG) BY MOUTH DAILY BEFORE BREAKFAST 05/21/23   Christopher Christopher RAMAN, MD  Loratadine  (CLARITIN  PO) Take 10 mg by mouth as needed.    [provider]  milrinone  (PRIMACOR ) 20 MG/100 ML SOLN infusion Inject 0.0139 mg/min into the vein continuous. Patient taking differently: Inject 0.25 mcg/kg/min into the vein daily in the afternoon. 11/16/19   Leotis Bogus, MD  Omega-3 Fatty Acids (FISH OIL) 1000 MG CAPS Take 1 capsule by mouth daily.    [provider]  potassium chloride  (KLOR-CON ) 10 MEQ  tablet Take 2 tablets (20 mEq total) by mouth daily. 08/26/22   Christopher Christopher RAMAN, MD  simvastatin  (ZOCOR ) 40 MG tablet Take 1 tablet (40 mg total) by mouth daily. 07/17/23   Christopher Christopher RAMAN, MD  spironolactone  (ALDACTONE ) 25 MG tablet Take 1 tablet (25 mg total) by mouth daily. 07/24/21   Christopher Christopher RAMAN, MD  Tafamidis  (VYNDAMAX ) 61 MG CAPS Take 1 capsule by mouth daily. 12/17/22   Christopher Christopher RAMAN, MD  torsemide  (DEMADEX ) 20 MG tablet Take 1.5 tablets (30 mg total) by mouth daily. 07/17/23   Christopher Christopher RAMAN, MD  TRADJENTA  5 MG TABS tablet Take 5 mg by mouth every morning. 04/27/20   [provider]    Scheduled Meds:  apixaban   5 mg Oral BID   Chlorhexidine  Gluconate Cloth  6 each Topical Daily   insulin  aspart  0-9 Units Subcutaneous TID WC   linagliptin   5 mg Oral q morning   potassium chloride   20 mEq Oral Daily   simvastatin   40 mg Oral q1800   Tafamidis   1 capsule Oral Daily   Continuous Infusions:  ceFEPime  (MAXIPIME ) IV 2 g (07/27/23 0951)   milrinone  0.125 mcg/kg/min (07/27/23 0038)   vancomycin       PRN Meds: acetaminophen  **OR** acetaminophen   Allergies:   No Known Allergies  Social History:   Social History   Socioeconomic History   Marital status: Single    Spouse name: Not on file   Number of children: Not on file   Years of education: Not on file   Highest education level: Not on file  Occupational History   Not on file  Tobacco Use   Smoking status: Never   Smokeless tobacco: Never  Vaping Use   Vaping status: Never Used  Substance and Sexual Activity   Alcohol use: No   Drug use: No   Sexual activity: Not Currently  Other Topics Concern   Not on file  Social History Narrative   Not on file   Social Drivers of Health   Financial Resource Strain: Not on file  Food Insecurity: Patient Declined (07/27/2023)   Hunger Vital Sign    Worried About Running Out of Food in the Last Year: Patient declined    Ran Out of Food in the Last Year: Patient declined  Transportation Needs: Patient Declined (07/27/2023)   PRAPARE - Administrator, Civil Service (Medical): Patient declined    Lack of Transportation (Non-Medical): Patient declined  Physical Activity: Not on file  Stress: Not on file  Social Connections: Patient Declined (07/27/2023)   Social Connection and Isolation Panel    Frequency of Communication with Friends and Family: Patient declined    Frequency of Social Gatherings with Friends and Family: Patient declined    Attends Religious Services: Patient declined    Database administrator or Organizations: Patient declined    Attends Banker Meetings: Patient declined    Marital Status: Patient declined  Intimate Partner Violence: Patient Declined (07/27/2023)   Humiliation, Afraid, Rape, and Kick questionnaire    Fear of Current or Ex-Partner: Patient declined    Emotionally Abused: Patient declined    Physically Abused: Patient declined    Sexually Abused: Patient declined    Family History:   Family History  Problem Relation  Age of Onset   Hypertension Mother    Diabetes Mother    Heart disease Father      ROS:  Please see the history of present  illness.  All other ROS reviewed and negative.     Physical Exam/Data: Vitals:   07/26/23 2235 07/27/23 0312 07/27/23 0710 07/27/23 0759  BP: (!) 88/58 106/72  102/72  Pulse: 76 80  87  Resp: 16 20  20   Temp: 98.3 F (36.8 C) 97.6 F (36.4 C)  97.9 F (36.6 C)  TempSrc: Oral Oral  Oral  SpO2: 100% 98%  99%  Weight: 59.8 kg  59.1 kg   Height:        Intake/Output Summary (Last 24 hours) at 07/27/2023 1213 Last data filed at 07/27/2023 0900 Gross per 24 hour  Intake 590 ml  Output 550 ml  Net 40 ml      07/27/2023    7:10 AM 07/26/2023   10:35 PM 07/26/2023    2:31 PM  Last 3 Weights  Weight (lbs) 130 lb 4.7 oz 131 lb 13.4 oz 125 lb  Weight (kg) 59.1 kg 59.8 kg 56.7 kg     Body mass index is 20.41 kg/m.  General: Slender, chronically ill-appearing, in no acute distress HEENT: normal Neck: JVD 10 cm Vascular: No carotid bruits; Distal pulses 2+ bilaterally Cardiac:  normal S1, S2; RRR; no murmur  Lungs:  clear to auscultation bilaterally except for few scattered rales, no wheezing, rhonchi Abd: soft, nontender, no hepatomegaly  Ext: no edema Musculoskeletal:  No deformities, BUE and BLE strength normal and equal Skin: warm and dry  Neuro:  CNs 2-12 intact, no focal abnormalities noted Psych:  Normal affect   EKG:  The EKG was personally reviewed and demonstrates:  atrial fib, HR 68 Telemetry:  Telemetry was personally reviewed and demonstrates:  atrial fib, no sustained bradycardia, heart rate generally controlled, 14 beat run NSVT  Relevant CV Studies:  - LHC (8/18): Mild nonobstructive CAD.  - RHC (11/21): mean RA 7, PA 29/9, mean PCWP 8, CI 1.6 (thermodilution)/2.5 (Fick) - Echo (11/21): EF 15-20%, D-shaped interventricular septum, severe dilated and severely dysfunctional RV, severe biatrial enlargement.  - Cardiac MRI (11/21): LV EF  24%, RV mildly dilated with EF 42%, extensive LGE in a non-coronary distribution throughout much of the LV, also involving RV free wall and left and right atrial walls, ECV 42%. This was suggestive of infiltrative disease. 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  - milrinone  dependent - Echo (6/22): EF 25-30%, mild LV dilation, mild LVH, moderately decreased RV systolic function, IVC dilated, PASP 49 mmHg. - Echo (6/23): EF 25-30%, mild LV dilation, mild LVH, moderately decreased RV systolic function, IVC dilated, PASP 49 mmHg.  Echo in 6/23 showed EF 25-30%, global hypokinesis, mild LV dilation, moderately decreased RV systolic function, severe biatrial enlargement, mild MR.  - Echo (8/24): EF 25-30%, no LVH, moderate decreased RV systolic function, moderate MR, severe biatrial enlargement, IVC dilated.   Laboratory Data: High Sensitivity Troponin:  No results for input(s): TROPONINIHS in the last 720 hours.   Chemistry Recent Labs  Lab 07/26/23 1613 07/27/23 0100 07/27/23 0918  NA 131* 133*  --   K 4.0 3.7  --   CL 97* 99  --   CO2 24 24  --   GLUCOSE 129* 91  --   BUN 28* 25*  --   CREATININE 1.53* 1.45*  --   CALCIUM 9.3 8.8*  --   MG  --   --  2.3  GFRNONAA 50* 53*  --   ANIONGAP 10 10  --  Recent Labs  Lab 07/26/23 1613  PROT 8.0  ALBUMIN 4.2  AST 57*  ALT 40  ALKPHOS 70  BILITOT 1.6*   Lipids No results for input(s): CHOL, TRIG, HDL, LABVLDL, LDLCALC, CHOLHDL in the last 168 hours.  Hematology Recent Labs  Lab 07/26/23 1613 07/27/23 0100  WBC 8.9 7.7  RBC 4.56 3.99*  HGB 13.3 11.9*  HCT 41.5 36.3*  MCV 91.0 91.0  MCH 29.2 29.8  MCHC 32.0 32.8  RDW 14.5 14.3  PLT 112* 101*   Thyroid  Recent Labs  Lab 07/27/23 0918  TSH 1.292    BNPNo results for input(s): BNP, PROBNP in the last 168 hours.  DDimer No results for input(s): DDIMER in the last 168  hours.  Radiology/Studies:  CT ABDOMEN PELVIS W CONTRAST Result Date: 07/26/2023 CLINICAL DATA:  Abdominal pain, acute, nonlocalized EXAM: CT ABDOMEN AND PELVIS WITH CONTRAST TECHNIQUE: Multidetector CT imaging of the abdomen and pelvis was performed using the standard protocol following bolus administration of intravenous contrast. RADIATION DOSE REDUCTION: This exam was performed according to the departmental dose-optimization program which includes automated exposure control, adjustment of the mA and/or kV according to patient size and/or use of iterative reconstruction technique. CONTRAST:  80mL OMNIPAQUE  IOHEXOL  300 MG/ML  SOLN COMPARISON:  CT abdomen pelvis 11/05/2019 FINDINGS: Lower chest: Cardiomegaly.  No acute abnormality. Hepatobiliary: The hepatic parenchyma is diffusely hypodense compared to the splenic parenchyma consistent with fatty infiltration. No focal liver abnormality. No gallstones, gallbladder wall thickening, or pericholecystic fluid. No biliary dilatation. Pancreas: No focal lesion. Normal pancreatic contour. No surrounding inflammatory changes. No main pancreatic ductal dilatation. Spleen: Normal in size without focal abnormality. Adrenals/Urinary Tract: No adrenal nodule bilaterally. Bilateral kidneys enhance symmetrically. No hydronephrosis. No hydroureter. No nephroureterolithiasis bilaterally. Subcentimeter hypodensities are too small to characterize-no further follow-up indicated. The urinary bladder is unremarkable. No excretion of intravenous contrast on delayed view from either kidneys. Stomach/Bowel: Stomach is within normal limits. No evidence of bowel wall thickening or dilatation. Appendix appears normal. Vascular/Lymphatic: No abdominal aorta or iliac aneurysm. Moderate atherosclerotic plaque of the aorta and its branches. No abdominal, pelvic, or inguinal lymphadenopathy. Reproductive: Prostate is unremarkable. Other: No intraperitoneal free fluid. No intraperitoneal free  gas. No organized fluid collection. Musculoskeletal: No abdominal wall hernia or abnormality. No suspicious lytic or blastic osseous lesions. No acute displaced fracture. Multilevel degenerative changes of the spine. IMPRESSION: 1. No acute intra-abdominal or intrapelvic abnormality. 2. No excretion of intravenous contrast on delayed view from either kidneys. Correlate with renal function test. 3. Cardiomegaly. 4. Mild hepatic steatosis. 5.  Aortic Atherosclerosis (ICD10-I70.0). Electronically Signed   By: Morgane  Naveau M.D.   On: 07/26/2023 19:27   DG Chest Portable 1 View Result Date: 07/26/2023 CLINICAL DATA:  Decreased activity.  Reported dysuria. EXAM: PORTABLE CHEST 1 VIEW COMPARISON:  06/20/2022 and older exams. FINDINGS: Mild stable enlargement of the cardiac silhouette. No mediastinal or hilar masses. Clear lungs.  No pleural effusion or pneumothorax. Right anterior chest wall central venous catheter extends to the internal jugular, tip in the mid superior vena cava. Skeletal structures are grossly intact. IMPRESSION: No active disease. Electronically Signed   By: Alm Parkins M.D.   On: 07/26/2023 17:30     Assessment and Plan: Chronic systolic heart failure - Unclear reason for his hide but attention, but he has tolerated fluid resuscitation well. - His Demadex  home dose is 30 mg daily, spironolactone  is 25 mg daily, both on hold for now. -Potassium dose of 20  mEq daily has continued, but K+ is only 3.7 as of this a.m. - His milrinone  dose is 0.125 mcg/kg/min, they have continued him on his home pump - His volume status is good, respiratory status is, and his blood pressure is at baseline. - He ambulated with the mobility team this a.m. and walked about as far as he normally does at home before he gets tired.  His heart rate increased appropriately - No new recommendations  2.  Permanent atrial fibs versus flutter - Heart rate is controlled with no sustained bradycardia.  He is not on  any rate lowering medications - Continue Eliquis   3.  Fever, possible sepsis - Lactic acid and white blood cells were within normal limits. - UA was normal except for glucose greater than 500, urine and blood cultures are pending - Management primary team   Risk Assessment/Risk Scores:       New York  Heart Association (NYHA) Functional Class NYHA Class III  CHA2DS2-VASc Score = 7   This indicates a 11.2% annual risk of stroke. The patient's score is based upon: CHF History: 1 HTN History: 1 Diabetes History: 1 Stroke History: 2 Vascular Disease History: 1 Age Score: 1 Gender Score: 0  For questions or updates, please contact [facility] Please consult [email or web address] for contact info under    Signed, Shona Shad, PA-C  07/27/2023 12:13 PM   Patient seen and examined, note reviewed with the signed Advanced Practice Provider. I personally reviewed laboratory data, imaging studies and relevant notes. I independently examined the patient and formulated the important aspects of the plan. I have personally discussed the plan with the patient and/or family. Comments or changes to the note/plan are indicated below.  Patient seen and examined at his bedside.  His niece was by the bedside during my visit.  He offers no complaints at this time.  Denies chest pain or shortness of breath.  But does admit to progressive fatigue and loss of appetite over the last several days.  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  Will continue his current dose/low-dose milligram at 0.125 which is running through the central line.  His PICC line was recently removed due to this being accidentally pulled out.   Marginal systolic blood pressure agree with holding spironolactone  at this time, as well as his torsemide .  Will reassess likely hopefully tomorrow we can restart his spironolactone .  He is on tafamidis  in the outpatient setting for his cardiac amyloidosis, and peripheral neuropathy is being treated with eplontersen .  Permanent atrial fibrillation thankfully his rate is controlled.  He is not on any rate controlling agents due to previous bradycardia.  He is on Eliquis  we will continue this.  Unfortunately his warts have potential infection has not been identified but he has been placed on empiric antibiotic which I agree with.  We will continue to follow with you.  He will be seen in the morning by our advanced heart failure team.   Laporscha Linehan DO, MS [facility] Attending Cardiologist [facility]  [address] [place], [place] [address] [phone] Website: [email or web address]

## 2023-07-28 DIAGNOSIS — A419 Sepsis, unspecified organism: Secondary | ICD-10-CM | POA: Diagnosis not present

## 2023-07-28 DIAGNOSIS — E854 Organ-limited amyloidosis: Secondary | ICD-10-CM | POA: Diagnosis not present

## 2023-07-28 DIAGNOSIS — I5022 Chronic systolic (congestive) heart failure: Secondary | ICD-10-CM | POA: Diagnosis not present

## 2023-07-28 LAB — BASIC METABOLIC PANEL WITH GFR
Anion gap: 10 (ref 5–15)
BUN: 17 mg/dL (ref 8–23)
CO2: 20 mmol/L — ABNORMAL LOW (ref 22–32)
Calcium: 8.4 mg/dL — ABNORMAL LOW (ref 8.9–10.3)
Chloride: 102 mmol/L (ref 98–111)
Creatinine, Ser: 1.07 mg/dL (ref 0.61–1.24)
GFR, Estimated: >60 mL/min (ref >60)
Glucose, Bld: 98 mg/dL (ref 70–99)
Potassium: 4.4 mmol/L (ref 3.5–5.1)
Sodium: 132 mmol/L — ABNORMAL LOW (ref 135–145)

## 2023-07-28 LAB — GLUCOSE, CAPILLARY
Glucose-Capillary: 91 mg/dL (ref 70–99)
Glucose-Capillary: 257 mg/dL — ABNORMAL HIGH (ref 70–99)
Glucose-Capillary: 60 mg/dL — ABNORMAL LOW (ref 70–99)
Glucose-Capillary: 94 mg/dL (ref 70–99)
Glucose-Capillary: 112 mg/dL — ABNORMAL HIGH (ref 70–99)

## 2023-07-28 LAB — CBC
HCT: 36.2 % — ABNORMAL LOW (ref 39.0–52.0)
Hemoglobin: 11.5 g/dL — ABNORMAL LOW (ref 13.0–17.0)
MCH: 29 pg (ref 26.0–34.0)
MCHC: 31.8 g/dL (ref 30.0–36.0)
MCV: 91.4 fL (ref 80.0–100.0)
Platelets: 80 K/uL — ABNORMAL LOW (ref 150–400)
RBC: 3.96 MIL/uL — ABNORMAL LOW (ref 4.22–5.81)
RDW: 14.5 % (ref 11.5–15.5)
WBC: 6.1 K/uL (ref 4.0–10.5)
nRBC: 0 % (ref 0.0–0.2)

## 2023-07-28 MED ORDER — MILRINONE LACTATE IN DEXTROSE 20-5 MG/100ML-% IV SOLN
0.1250 ug/kg/min | INTRAVENOUS | Status: DC
  Administered 2023-07-28: 0.125 ug/kg/min via INTRAVENOUS
  Filled 2023-07-28: qty 100

## 2023-07-28 MED ORDER — SPIRONOLACTONE 25 MG PO TABS
25.0000 mg | ORAL_TABLET | Freq: Every day | ORAL | Status: DC
  Administered 2023-07-28 – 2023-07-29 (×2): 25 mg via ORAL
  Filled 2023-07-28 (×2): qty 1

## 2023-07-28 MED ORDER — DAPAGLIFLOZIN PROPANEDIOL 10 MG PO TABS
10.0000 mg | ORAL_TABLET | Freq: Every day | ORAL | Status: DC
  Administered 2023-07-28: 10 mg via ORAL
  Filled 2023-07-28: qty 1

## 2023-07-28 NOTE — Progress Notes (Signed)
 Mobility Specialist Progress Note:    07/28/23 0946  Mobility  Activity Ambulated with assistance to bathroom  Level of Assistance Minimal assist, patient does 75% or more  Assistive Device Cane  Distance Ambulated (ft) 12 ft  Activity Response Tolerated well  Mobility Referral Yes  Mobility visit 1 Mobility  Mobility Specialist Start Time (ACUTE ONLY) 0946  Mobility Specialist Stop Time (ACUTE ONLY) 0953  Mobility Specialist Time Calculation (min) (ACUTE ONLY) 7 min   Pt requested assistance to bathroom. Required MinA with cane. Pt unsteady required CGA during ambulation. Niece in room. Encouraged to pull bathroom light when finished. All needs met.   Jumaane Weatherford Mobility Specialist Please contact via Special educational needs teacher or  Rehab office at [phone]

## 2023-07-28 NOTE — TOC Initial Note (Signed)
 Transition of Care [facility]) - Initial/Assessment Note    Patient Details  Name: [name] MRN: [id number] Date of Birth: [birth year]  Transition of Care [facility]) CM/SW Contact:    Justina Delcia Czar, RN Phone Number: [phone] 07/28/2023, 12:49 PM  Clinical Narrative:                 Spoke to pt's niece, Shirleen. States pt lives with brother. She and sister assist with care in the home. [name] and brother take pt to appts.  Pt has scale at home for daily weights. Has cane for ambulation. Pt active with [facility] and [facility] . Contacted [facility] rep, Pam with referral. States she will continue to follow, pt active with [facility] .   Will continue to follow for dc needs.   Expected Discharge Plan: Home w Home Health Services Barriers to Discharge: Continued Medical Work up   Patient Goals and CMS Choice   CMS Medicare.gov Compare Post Acute Care list provided to:: Patient Represenative (must comment) Choice offered to / list presented to : Adult Children      Expected Discharge Plan and Services   Discharge Planning Services: CM Consult Post Acute Care Choice: Home Health Living arrangements for the past 2 months: Single Family Home                           HH Arranged: RN HH Agency: [facility] Date HH Agency Contacted: 07/28/23 Time HH Agency Contacted: [id number] Representative spoke with at [facility] Agency: Pam RN  Prior Living Arrangements/Services Living arrangements for the past 2 months: Single Family Home Lives with:: Siblings Patient language and need for interpreter reviewed:: Yes Do you feel safe going back to the place where you live?: Yes      Need for Family Participation in Patient Care: Yes (Comment) Care giver support system in place?: Yes (comment) Current home services: DME (scale, cane) Criminal Activity/Legal Involvement Pertinent to Current Situation/Hospitalization: No - Comment as needed  Activities of Daily Living    ADL Screening (condition at time of admission) Independently performs ADLs?: No Does the patient have a NEW difficulty with bathing/dressing/toileting/self-feeding that is expected to last >3 days?: No Does the patient have a NEW difficulty with getting in/out of bed, walking, or climbing stairs that is expected to last >3 days?: No Does the patient have a NEW difficulty with communication that is expected to last >3 days?: No Is the patient deaf or have difficulty hearing?: No Does the patient have difficulty seeing, even when wearing glasses/contacts?: No Does the patient have difficulty concentrating, remembering, or making decisions?: No  Permission Sought/Granted Permission sought to share information with : Case Manager, PCP, Family Supports Permission granted to share information with : Yes, Verbal Permission Granted  Share Information with NAME: Shirleen Poli  Permission granted to share info w AGENCY: Home Health, DME, PCP  Permission granted to share info w Relationship: niece  Permission granted to share info w Contact Information: [phone]  Emotional Assessment              Admission diagnosis:  Sepsis [facility]) [A41.9] Fever present on examination [R50.9] Patient Active Problem List   Diagnosis Date Noted   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   Sepsis (HCC) 07/26/2023   CKD stage 3a, GFR 45-59 ml/min (HCC) 07/26/2023   Encounter for hospice care discussion    Palliative care by specialist    Cardiogenic shock (HCC) 11/05/2019  Hyponatremia 11/05/2019   end stage NICM (nonischemic cardiomyopathy) (HCC) 07/16/2019   Permanent atrial fibrillation (HCC)    Acute on chronic HFrEF (heart failure with reduced ejection fraction) (HCC)    Essential hypertension 07/15/2017   Type 2 diabetes mellitus without complication, without long-term current use of insulin  (HCC) 09/09/2016   Cardiomyopathy (HCC) 06/02/2008   ABNORMAL EKG 05/02/2008   Controlled type 2  diabetes mellitus without complication, without long-term current use of insulin  (HCC) 04/28/2008   HLD (hyperlipidemia) 04/28/2008   Migraine headache 04/28/2008   History of stroke 04/28/2008   PCP:  Alec House, MD Pharmacy:   [facility] - [facility] [address] [place] [address] Phone: [phone] Fax: [phone]  [id number] [place], [place] - [address] AT [facility] [address] [place] [address] Phone: [phone] Fax: [phone]  [facility] #[id number] - [place], [place] - [address] AT [facility] [address] [place] [address] Phone: [phone] Fax: [phone]     Social Drivers of Health (SDOH) Social History: SDOH Screenings   Food Insecurity: Patient Declined (07/27/2023)  Housing: Unknown (07/27/2023)  Transportation Needs: Patient Declined (07/27/2023)  Utilities: Patient Declined (07/27/2023)  Social Connections: Patient Declined (07/27/2023)  Tobacco Use: Low Risk  (07/09/2023)   SDOH Interventions:     Readmission Risk Interventions     No data to display

## 2023-07-28 NOTE — Progress Notes (Signed)
 PROGRESS NOTE    [name]  FMW:[id number] DOB: 05-Nov-1955 DOA: 07/26/2023 PCP: Alec House, MD   Brief Narrative:  HPI: [name] is a 68 y.o. male with medical history significant of hx of CVA with aphasia,end stage NICM, HLD, T2DM, permanent atrial fibrillation who presented to ED with complaints of weakness. His niece gives history. This morning she could tell he was off. He didn't feel great. She checked his vitals and his blood pressure was 87/70 which she says was low for him. He usually runs around 100/70. He stayed in bed all morning and this is not like him at all. He told his niece he felt light headed and dizzy. He also stated he had some pain with urination. No fevers/chills at home. He had PICC line placed last week in his chest. His PICC line in his arm had come out and he needed a new PICC line placed.    He has no urinary urgency or frequency and has no problems urinating. He does endorse some dysuria and some supra pubic pain. Urine looks normal and has no odor.       Denies any fever/chills, vision changes/headaches, chest pain or palpitations, shortness of breath or cough, abdominal pain, N/V/D, or leg swelling.    He does not smoke or drink alcohol.      ER Course:  vitals: temp: 101.2, bp: 95/67, HR: 79, RR: 18>25, oxygen: 96%RA Pertinent labs: platelets 112, sodium: 131, BUN 28, creatinine: 1.53,  Lactic acid: 1.1>1.2,  CXR: no acute finding CT abdomen/pelvis: no acute finding. No excretion of IV contrast on delayed view from either kidneys. Correlate with renal function test. Cardiomegaly. Mild hepatic steatosis, aortic atherosclerosis.  In ED: given 500cc IVF bolus, BC and urine culture obtained. Rocpehin given. [facility] asked to admit.   Assessment & Plan:   Principal Problem:   Sepsis (HCC) Active Problems:   Hyponatremia   end stage NICM (nonischemic cardiomyopathy) (HCC)   Permanent atrial fibrillation (HCC)   Cardiac amyloidosis (HCC)    Controlled type 2 diabetes mellitus without complication, without long-term current use of insulin  (HCC)   CKD stage 3a, GFR 45-59 ml/min (HCC)   Essential hypertension   History of stroke   HLD (hyperlipidemia)  For now sepsis ruled out since there is no clear source of infection.  SIRS ruled in, POA: 68 year old male presenting to ED with complaints of weakness, lightheadedness and dysuria found to have fever to 101.2 and tachypnea with unclear source at time of admission.  Workup negative so far including CT abdomen and pelvis, chest x-ray as well as UA.  No leukocytosis.  Last temperature spike of 101.2 At 2:30 PM 07/26/2023.  PICC line is suspected source as this was replaced a week ago, cultures are negative, we will follow along.  Patient has been started on cefepime  and vancomycin  which we will continue.  Procalcitonin significantly elevated around 12.  Chronic hyponatremia Around baseline, 131-134, at baseline.   end stage NICM (nonischemic cardiomyopathy) (HCC) Echo 08/2022: EF 25-30%, no LVH, moderate decreased RV systolic function, moderate MR, severe biatrial enlargement, IVC dilated. On continuous home milirone infusion, Not a transplant candidate or LVAD candidate, cardiology on-call was consulted and they recommended continuing home milrinone  infusion with no changes. Blood pressures much softer than baseline, holding aldactone /demadex  for now.  Cardiology following.   Permanent atrial fibrillation (HCC) Now permanent.  Continue eliquis   Initially in 2018, EF improved after DCCV.  However, as above, he has been relatively  well rate controlled and has not been in RVR, so do not think that cardiomyopathy is primarily due to AF.  He failed DCCV on amiodarone  10/21, amiodarone  stopped due to bradycardia. 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    Cardiac amyloidosis (HCC) continue tafamidis . Continue eplontersen  with improvement in peripheral neuropathy symptoms.     Controlled type 2 diabetes mellitus without complication, without long-term current use of insulin  (HCC) A1C of 5.4 in 2021 Tradjenta  and farxiga   Holding farxiga  until orthostatics result  SSI and accuchecks QAC/HS    CKD stage 3a, GFR 45-59 ml/min (HCC) Baseline creatinine 1.4-1.5 stable   Essential hypertension Soft bp, much softer than normal.  Hold aldactone , demadex  for now, cardiology on board, management per them.   History of stroke Baseline aphasia with right hemiparesis, stable. On eliquis /statin    HLD (hyperlipidemia) Continue zocor  40mg  daily     DVT prophylaxis: apixaban  (ELIQUIS ) tablet 5 mg Start: 07/26/23 2300 Place TED hose Start: 07/26/23 2139   Code Status: Full Code  Family Communication: Niece present at bedside.  Plan of care discussed with patient in length and he/she verbalized understanding and agreed with it.  Status is: Inpatient Remains inpatient appropriate because: Still looking for source of infection.  Awaiting culture reports.  Estimated body mass index is 20.54 kg/m as calculated from the following:   Height as of this encounter: 5' 7 (1.702 m).   Weight as of this encounter: 59.5 kg.    Nutritional Assessment: Body mass index is 20.54 kg/m.SABRA Seen by dietician.  I agree with the assessment and plan as outlined below: Nutrition Status:        . Skin Assessment: I have examined the patient's skin and I agree with the wound assessment as performed by the wound care RN as outlined below:    Consultants:  Cardiology  Procedures:  None  Antimicrobials:  Anti-infectives (From admission, onward)    Start     Dose/Rate Route Frequency Ordered Stop   07/27/23 2200  vancomycin  (VANCOREADY) IVPB 750 mg/150 mL        750 mg 150 mL/hr over 60 Minutes Intravenous Every 24 hours 07/26/23 2050     07/26/23 2200  ceFEPIme  (MAXIPIME ) 2 g in sodium chloride  0.9 % 100 mL IVPB        2 g 200 mL/hr over 30 Minutes Intravenous Every 12  hours 07/26/23 2044     07/26/23 2200  vancomycin  (VANCOREADY) IVPB 1250 mg/250 mL        1,250 mg 166.7 mL/hr over 90 Minutes Intravenous  Once 07/26/23 2045 07/27/23 0334   07/26/23 2000  cefTRIAXone  (ROCEPHIN ) 1 g in sodium chloride  0.9 % 100 mL IVPB        1 g 200 mL/hr over 30 Minutes Intravenous  Once 07/26/23 1948 07/26/23 2133         Subjective: Seen and examined, niece at the bedside.  Patient complained of some pain at the insertion site of the PICC line but upon examination, no signs of infection.  Objective: Vitals:   07/27/23 2301 07/28/23 0242 07/28/23 0500 07/28/23 0802  BP: 103/68 97/76  94/65  Pulse: 72 64  64  Resp: 20 15  19   Temp: 98 F (36.7 C) 97.8 F (36.6 C)  98.4 F (36.9 C)  TempSrc: Oral Oral  Oral  SpO2: 98% 100%  98%  Weight:   59.5 kg   Height:        Intake/Output Summary (Last 24 hours)  at 07/28/2023 0826 Last data filed at 07/28/2023 0804 Gross per 24 hour  Intake 1670 ml  Output 1000 ml  Net 670 ml   Filed Weights   07/26/23 2235 07/27/23 0710 07/28/23 0500  Weight: 59.8 kg 59.1 kg 59.5 kg    Examination:  General exam: Appears calm and comfortable  Respiratory system: Clear to auscultation. Respiratory effort normal. Cardiovascular system: S1 & S2 heard, RRR. No JVD, murmurs, rubs, gallops or clicks. No pedal edema. Gastrointestinal system: Abdomen is nondistended, soft and nontender. No organomegaly or masses felt. Normal bowel sounds heard. Central nervous system: Alert and oriented.  Right hemiparesis from previous stroke. Extremities: Symmetric 5 x 5 power. Skin: No rashes, lesions or ulcers  Data Reviewed: I have personally reviewed following labs and imaging studies  CBC: Recent Labs  Lab 07/26/23 1613 07/27/23 0100  WBC 8.9 7.7  NEUTROABS 7.9*  --   HGB 13.3 11.9*  HCT 41.5 36.3*  MCV 91.0 91.0  PLT 112* 101*   Basic Metabolic Panel: Recent Labs  Lab 07/26/23 1613 07/27/23 0100 07/27/23 0918  NA 131*  133*  --   K 4.0 3.7  --   CL 97* 99  --   CO2 24 24  --   GLUCOSE 129* 91  --   BUN 28* 25*  --   CREATININE 1.53* 1.45*  --   CALCIUM 9.3 8.8*  --   MG  --   --  2.3   GFR: Estimated Creatinine Clearance: 41.6 mL/min (A) (by C-G formula based on SCr of 1.45 mg/dL (H)). Liver Function Tests: Recent Labs  Lab 07/26/23 1613  AST 57*  ALT 40  ALKPHOS 70  BILITOT 1.6*  PROT 8.0  ALBUMIN 4.2   No results for input(s): LIPASE, AMYLASE in the last 168 hours. No results for input(s): AMMONIA in the last 168 hours. Coagulation Profile: No results for input(s): INR, PROTIME in the last 168 hours. Cardiac Enzymes: No results for input(s): CKTOTAL, CKMB, CKMBINDEX, TROPONINI in the last 168 hours. BNP (last 3 results) No results for input(s): PROBNP in the last 8760 hours. HbA1C: Recent Labs    07/27/23 0918  HGBA1C 6.0*   CBG: Recent Labs  Lab 07/27/23 0601 07/27/23 1218 07/27/23 1634 07/27/23 2302 07/28/23 0630  GLUCAP 101* 82 126* 148* 91   Lipid Profile: No results for input(s): CHOL, HDL, LDLCALC, TRIG, CHOLHDL, LDLDIRECT in the last 72 hours. Thyroid Function Tests: Recent Labs    07/27/23 0918  TSH 1.292   Anemia Panel: No results for input(s): VITAMINB12, FOLATE, FERRITIN, TIBC, IRON, RETICCTPCT in the last 72 hours. Sepsis Labs: Recent Labs  Lab 07/26/23 1623 07/26/23 1901 07/27/23 0100 07/27/23 0918  PROCALCITON  --   --  12.28 11.36  LATICACIDVEN 1.1 1.2  --   --     Recent Results (from the past 240 hours)  Culture, blood (routine x 2)     Status: None (Preliminary result)   Collection Time: 07/26/23  4:13 PM   Specimen: BLOOD LEFT ARM  Result Value Ref Range Status   Specimen Description   Final    BLOOD LEFT ARM Performed at [facility], [address], [place], [place] [address]    Special Requests   Final    BOTTLES DRAWN AEROBIC AND ANAEROBIC Blood Culture adequate volume Performed  at [facility], [address] [place], [place] [address]    Culture   Final    NO GROWTH <  12 HOURS Performed at [facility], [address], [place], [place] [address]    Report Status PENDING  Incomplete  Resp panel by RT-PCR (RSV, Flu A&B, Covid) Anterior Nasal Swab     Status: None   Collection Time: 07/26/23  5:45 PM   Specimen: Anterior Nasal Swab  Result Value Ref Range Status   SARS Coronavirus 2 by RT PCR NEGATIVE NEGATIVE Final    Comment: (NOTE) SARS-CoV-2 target nucleic acids are NOT DETECTED.  The SARS-CoV-2 RNA is generally detectable in upper respiratory specimens during the acute phase of infection. The lowest concentration of SARS-CoV-2 viral copies this assay can detect is 138 copies/mL. A negative result does not preclude SARS-Cov-2 infection and should not be used as the sole basis for treatment or other patient management decisions. A negative result may occur with  improper specimen collection/handling, submission of specimen other than nasopharyngeal swab, presence of viral mutation(s) within the areas targeted by this assay, and inadequate number of viral copies(<138 copies/mL). A negative result must be combined with clinical observations, patient history, and epidemiological information. The expected result is Negative.  Fact Sheet for Patients:  BloggerCourse.com  Fact Sheet for Healthcare Providers:  SeriousBroker.it  This test is no t yet approved or cleared by the [place]  FDA and  has been authorized for detection and/or diagnosis of SARS-CoV-2 by FDA under an Emergency Use Authorization (EUA). This EUA will remain  in effect (meaning this test can be used) for the duration of the COVID-19 declaration under Section 564(b)(1) of the Act, 21 U.S.C.section 360bbb-3(b)(1), unless the authorization is terminated  or revoked sooner.       Influenza A by PCR NEGATIVE  NEGATIVE Final   Influenza B by PCR NEGATIVE NEGATIVE Final    Comment: (NOTE) The Xpert Xpress SARS-CoV-2/FLU/RSV plus assay is intended as an aid in the diagnosis of influenza from Nasopharyngeal swab specimens and should not be used as a sole basis for treatment. Nasal washings and aspirates are unacceptable for Xpert Xpress SARS-CoV-2/FLU/RSV testing.  Fact Sheet for Patients: BloggerCourse.com  Fact Sheet for Healthcare Providers: SeriousBroker.it  This test is not yet approved or cleared by the [place]  FDA and has been authorized for detection and/or diagnosis of SARS-CoV-2 by FDA under an Emergency Use Authorization (EUA). This EUA will remain in effect (meaning this test can be used) for the duration of the COVID-19 declaration under Section 564(b)(1) of the Act, 21 U.S.C. section 360bbb-3(b)(1), unless the authorization is terminated or revoked.     Resp Syncytial Virus by PCR NEGATIVE NEGATIVE Final    Comment: (NOTE) Fact Sheet for Patients: BloggerCourse.com  Fact Sheet for Healthcare Providers: SeriousBroker.it  This test is not yet approved or cleared by the [place]  FDA and has been authorized for detection and/or diagnosis of SARS-CoV-2 by FDA under an Emergency Use Authorization (EUA). This EUA will remain in effect (meaning this test can be used) for the duration of the COVID-19 declaration under Section 564(b)(1) of the Act, 21 U.S.C. section 360bbb-3(b)(1), unless the authorization is terminated or revoked.  Performed at [facility], [address], [place], [place] [address]   Respiratory (~20 pathogens) panel by PCR     Status: None   Collection Time: 07/26/23  8:28 PM   Specimen: Nasopharyngeal Swab; Respiratory  Result Value Ref Range Status   Adenovirus NOT DETECTED NOT DETECTED Final   Coronavirus 229E NOT DETECTED  NOT DETECTED Final    Comment: (NOTE) The  Coronavirus on the Respiratory Panel, DOES NOT test for the novel  Coronavirus (2019 nCoV)    Coronavirus HKU1 NOT DETECTED NOT DETECTED Final   Coronavirus NL63 NOT DETECTED NOT DETECTED Final   Coronavirus OC43 NOT DETECTED NOT DETECTED Final   Metapneumovirus NOT DETECTED NOT DETECTED Final   Rhinovirus / Enterovirus NOT DETECTED NOT DETECTED Final   Influenza A NOT DETECTED NOT DETECTED Final   Influenza B NOT DETECTED NOT DETECTED Final   Parainfluenza Virus 1 NOT DETECTED NOT DETECTED Final   Parainfluenza Virus 2 NOT DETECTED NOT DETECTED Final   Parainfluenza Virus 3 NOT DETECTED NOT DETECTED Final   Parainfluenza Virus 4 NOT DETECTED NOT DETECTED Final   Respiratory Syncytial Virus NOT DETECTED NOT DETECTED Final   Bordetella pertussis NOT DETECTED NOT DETECTED Final   Bordetella Parapertussis NOT DETECTED NOT DETECTED Final   Chlamydophila pneumoniae NOT DETECTED NOT DETECTED Final   Mycoplasma pneumoniae NOT DETECTED NOT DETECTED Final    Comment: Performed at [facility], [address], [place], [place] [address]  Urine Culture     Status: Abnormal   Collection Time: 07/26/23  8:57 PM   Specimen: Urine, Clean Catch  Result Value Ref Range Status   Specimen Description   Final    URINE, CLEAN CATCH Performed at [facility], [address] [place], [place] [address]    Special Requests   Final    NONE Performed at [facility], [address], [place], [place] [address]    Culture (A)  Final    <10,000 COLONIES/mL INSIGNIFICANT GROWTH Performed at [facility], [address] [place], [place] [address]    Report Status 07/27/2023 FINAL  Final     Radiology Studies: CT ABDOMEN PELVIS W CONTRAST Result Date: 07/26/2023 CLINICAL DATA:  Abdominal pain, acute, nonlocalized EXAM: CT ABDOMEN AND PELVIS WITH CONTRAST TECHNIQUE: Multidetector CT imaging of the abdomen and  pelvis was performed using the standard protocol following bolus administration of intravenous contrast. RADIATION DOSE REDUCTION: This exam was performed according to the departmental dose-optimization program which includes automated exposure control, adjustment of the mA and/or kV according to patient size and/or use of iterative reconstruction technique. CONTRAST:  80mL OMNIPAQUE  IOHEXOL  300 MG/ML  SOLN COMPARISON:  CT abdomen pelvis 11/05/2019 FINDINGS: Lower chest: Cardiomegaly.  No acute abnormality. Hepatobiliary: The hepatic parenchyma is diffusely hypodense compared to the splenic parenchyma consistent with fatty infiltration. No focal liver abnormality. No gallstones, gallbladder wall thickening, or pericholecystic fluid. No biliary dilatation. Pancreas: No focal lesion. Normal pancreatic contour. No surrounding inflammatory changes. No main pancreatic ductal dilatation. Spleen: Normal in size without focal abnormality. Adrenals/Urinary Tract: No adrenal nodule bilaterally. Bilateral kidneys enhance symmetrically. No hydronephrosis. No hydroureter. No nephroureterolithiasis bilaterally. Subcentimeter hypodensities are too small to characterize-no further follow-up indicated. The urinary bladder is unremarkable. No excretion of intravenous contrast on delayed view from either kidneys. Stomach/Bowel: Stomach is within normal limits. No evidence of bowel wall thickening or dilatation. Appendix appears normal. Vascular/Lymphatic: No abdominal aorta or iliac aneurysm. Moderate atherosclerotic plaque of the aorta and its branches. No abdominal, pelvic, or inguinal lymphadenopathy. Reproductive: Prostate is unremarkable. Other: No intraperitoneal free fluid. No intraperitoneal free gas. No organized fluid collection. Musculoskeletal: No abdominal wall hernia or abnormality. No suspicious lytic or blastic osseous lesions. No acute displaced fracture. Multilevel degenerative changes of the spine. IMPRESSION: 1. No  acute intra-abdominal or intrapelvic abnormality. 2. No excretion of intravenous contrast on delayed view  from either kidneys. Correlate with renal function test. 3. Cardiomegaly. 4. Mild hepatic steatosis. 5.  Aortic Atherosclerosis (ICD10-I70.0). Electronically Signed   By: Morgane  Naveau M.D.   On: 07/26/2023 19:27   DG Chest Portable 1 View Result Date: 07/26/2023 CLINICAL DATA:  Decreased activity.  Reported dysuria. EXAM: PORTABLE CHEST 1 VIEW COMPARISON:  06/20/2022 and older exams. FINDINGS: Mild stable enlargement of the cardiac silhouette. No mediastinal or hilar masses. Clear lungs.  No pleural effusion or pneumothorax. Right anterior chest wall central venous catheter extends to the internal jugular, tip in the mid superior vena cava. Skeletal structures are grossly intact. IMPRESSION: No active disease. Electronically Signed   By: Alm Parkins M.D.   On: 07/26/2023 17:30    Scheduled Meds:  apixaban   5 mg Oral BID   Chlorhexidine  Gluconate Cloth  6 each Topical Daily   insulin  aspart  0-9 Units Subcutaneous TID WC   linagliptin   5 mg Oral q morning   potassium chloride   20 mEq Oral Daily   simvastatin   40 mg Oral q1800   Tafamidis   1 capsule Oral Daily   Continuous Infusions:  ceFEPime  (MAXIPIME ) IV 2 g (07/28/23 0813)   milrinone  0.125 mcg/kg/min (07/27/23 0038)   vancomycin  Stopped (07/27/23 2323)     LOS: 1 day   Fredia Skeeter, MD [facility]  07/28/2023, 8:26 AM   *Please note that this is a verbal dictation therefore any spelling or grammatical errors are due to the Dragon Medical One system interpretation.  Please page via Amion and do not message via secure chat for urgent patient care matters. Secure chat can be used for non urgent patient care matters.  How to contact the [facility] Attending or Consulting provider 7A - 7P or covering provider during after hours 7P -7A, for this patient?  Check the care team in [facility] and look for a) attending/consulting [facility]  provider listed and b) the [facility] team listed. Page or secure chat 7A-7P. Log into [email or web address] and use [facility] universal password to access. If you do not have the password, please contact the hospital operator. Locate the [facility] provider you are looking for under [facility] and page to a number that you can be directly reached. If you still have difficulty reaching the provider, please page the [facility] (Director on Call) for the Hospitalists listed on amion for assistance.

## 2023-07-28 NOTE — Consult Note (Signed)
 Advanced Heart Failure Team Consult Note   Primary Physician: Alec House, MD Cardiologist:  Lonni Cash, MD  Reason for Consultation: End-stage HF on home inotrope  HPI:    [name] is seen today for evaluation of end-stage HF on home inotrope at the request of Dr. Vernon with [facility].   68 y.o. 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  He has HF dating back to 2018 when he was found to have EF 20% in setting of new atrial flutter. He had no significant CAD on cath. EF had improved to 45-50% in 11/2016 but has since been in 20-25% range.   Patient was admitted in 10/21 with volume overload. Echo w/ EF 20%, reduced RV, D-shaped septum, severe biatrial enlargement.  He was in atrial fibrillation in 50s-60s. Cardiac MRI showed LV EF 24%, RV mildly dilated with EF 42%, extensive LGE in a non-coronary distribution throughout much of the LV, also involving RV free wall and left and right atrial walls, ECV 42%. This was suggestive of infiltrative disease. 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  He failed milrinone  wean and was discharged home on milrinone  at 0.125 mcg/kg/min.    Echo 8/24 EF 25-30%, moderately decreased RV systolic function, moderate MR, severe biatrial enlargement   His PICC line was recently pulled out, replaced by IR on 07/11.   He presented to the ED 07/26/23 with weakness, dizziness and hypotension. Family noticed he did not appear to be himself. He had a fever with T of 101.2 F. He was given 500 cc IVF. He was admitted with concern for sepsis. Started on IV abx. Lactic acid okay. Respiratory panel negative. CXR unremarkable, CT A/P w/ contrast with no acute abnormality. BC X 2  pending. UA okay. PCT 12.28>11.36. Home HF meds including torsemide , spiro and farxiga  held on admit d/t hypotension and concern for low volume status.  Reports feeling better today. His family at bedside notes that his blood pressure is near baseline, 90s-100s/60s.  Home Medications Prior to Admission medications   Medication Sig Start Date End Date Taking? Authorizing Provider  Accu-Chek FastClix Lancets MISC 1 each by Other route as directed. 08/19/19   [provider]  ACCU-CHEK GUIDE test strip 1 each by Other route See admin instructions. 09/16/19   [provider]  acetaminophen  (TYLENOL ) 500 MG tablet Take 1,000 mg by mouth every 6 (six) hours as needed for pain. Patient taking differently: Take 1,000 mg by mouth as needed for pain.    [provider]  ELIQUIS  5 MG TABS tablet TAKE 1 TABLET(5 MG) BY MOUTH TWICE DAILY 12/18/21   McLean, Dalton S, MD  Eplontersen  Sodium ([name] ) 45 MG/0.8ML SOAJ Inject 45 mg into the skin every 30 (thirty) days. 04/22/23   Rolan Ezra RAMAN, MD  FARXIGA  10 MG TABS tablet TAKE 1 TABLET(10 MG) BY MOUTH DAILY BEFORE BREAKFAST 05/21/23   McLean, Dalton S, MD  Loratadine  (CLARITIN  PO) Take 10 mg by mouth as needed.    [provider]  milrinone  (PRIMACOR ) 20 MG/100 ML SOLN infusion Inject 0.0139 mg/min into the vein continuous. Patient taking differently: Inject 0.25 mcg/kg/min into the vein daily in the afternoon. 11/16/19   Leotis Bogus, MD  Omega-3 Fatty Acids (FISH OIL) 1000 MG CAPS Take 1  capsule by mouth daily.    [provider]  potassium chloride  (KLOR-CON ) 10 MEQ tablet Take 2 tablets (20 mEq total) by mouth daily. 08/26/22   Rolan Ezra RAMAN, MD  simvastatin  (ZOCOR ) 40 MG tablet Take 1 tablet (40 mg total) by mouth daily. 07/17/23   Rolan Ezra RAMAN, MD  spironolactone  (ALDACTONE ) 25 MG tablet Take 1 tablet (25 mg total) by mouth daily. 07/24/21   Rolan Ezra RAMAN, MD  Tafamidis  (VYNDAMAX ) 61 MG CAPS Take 1  capsule by mouth daily. 12/17/22   Rolan Ezra RAMAN, MD  torsemide  (DEMADEX ) 20 MG tablet Take 1.5 tablets (30 mg total) by mouth daily. 07/17/23   Rolan Ezra RAMAN, MD  TRADJENTA  5 MG TABS tablet Take 5 mg by mouth every morning. 04/27/20   [provider]    Past Medical History: Past Medical History:  Diagnosis Date   Atrial flutter (HCC)    CHF (congestive heart failure) (HCC)    Diabetes mellitus    Hypertension    NICM (nonischemic cardiomyopathy) (HCC) 08/2016   Mild, nonobstructive CAD at cath, EF initially 20%, improved to 50% 11/2016 echo   Stroke [facility])     Past Surgical History: Past Surgical History:  Procedure Laterality Date   CARDIOVERSION N/A 10/10/2016   Procedure: CARDIOVERSION;  Surgeon: Pietro Redell RAMAN, MD;  Location: [facility] ENDOSCOPY;  Service: Cardiovascular;  Laterality: N/A;   CARDIOVERSION N/A 11/10/2019   Procedure: CARDIOVERSION;  Surgeon: Rolan Ezra RAMAN, MD;  Location: [facility] ENDOSCOPY;  Service: Cardiovascular;  Laterality: N/A;   IR CV LINE INJECTION  07/18/2023   IR FLUORO GUIDE CV LINE RIGHT  07/18/2023   IR FLUOROGUIDE CV LINE NO REPORT  07/15/2022   IR US  GUIDE VASC ACCESS RIGHT  07/18/2023   RIGHT HEART CATH N/A 11/11/2019   Procedure: RIGHT HEART CATH;  Surgeon: Rolan Ezra RAMAN, MD;  Location: [facility] INVASIVE CV LAB;  Service: Cardiovascular;  Laterality: N/A;   RIGHT/LEFT HEART CATH AND CORONARY ANGIOGRAPHY N/A 09/06/2016   Procedure: RIGHT/LEFT HEART CATH AND CORONARY ANGIOGRAPHY;  Surgeon: Burnard Debby LABOR, MD;  Location: [facility] INVASIVE CV LAB;  Service: Cardiovascular;  Laterality: N/A;   TRACHEOSTOMY      Family History: Family History  Problem Relation Age of Onset   Hypertension Mother    Diabetes Mother    Heart disease Father     Social History: Social History   Socioeconomic History   Marital status: Single    Spouse name: Not on file   Number of children: Not on file   Years of education: Not on file   Highest education level: Not  on file  Occupational History   Not on file  Tobacco Use   Smoking status: Never   Smokeless tobacco: Never  Vaping Use   Vaping status: Never Used  Substance and Sexual Activity   Alcohol use: No   Drug use: No   Sexual activity: Not Currently  Other Topics Concern   Not on file  Social History Narrative   Not on file   Social Drivers of Health   Financial Resource Strain: Not on file  Food Insecurity: Patient Declined (07/27/2023)   Hunger Vital Sign    Worried About Running Out of Food in the Last Year: Patient declined    Ran Out of Food in the Last Year: Patient declined  Transportation Needs: Patient Declined (07/27/2023)   PRAPARE - Administrator, Civil Service (Medical): Patient declined    Lack of Transportation (  Non-Medical): Patient declined  Physical Activity: Not on file  Stress: Not on file  Social Connections: Patient Declined (07/27/2023)   Social Connection and Isolation Panel    Frequency of Communication with Friends and Family: Patient declined    Frequency of Social Gatherings with Friends and Family: Patient declined    Attends Religious Services: Patient declined    Database administrator or Organizations: Patient declined    Attends Engineer, structural: Patient declined    Marital Status: Patient declined    Allergies:  No Known Allergies  Objective:    Vital Signs:   Temp:  [97.8 F (36.6 C)-99.9 F (37.7 C)] 98.4 F (36.9 C) (07/21 0802) Pulse Rate:  [64-83] 64 (07/21 0802) Resp:  [15-20] 19 (07/21 0802) BP: (94-110)/(60-76) 94/65 (07/21 0802) SpO2:  [98 %-100 %] 98 % (07/21 0802) Weight:  [59.5 kg] 59.5 kg (07/21 0500) Last BM Date : 07/27/23  Weight change: Filed Weights   07/26/23 2235 07/27/23 0710 07/28/23 0500  Weight: 59.8 kg 59.1 kg 59.5 kg    Intake/Output:   Intake/Output Summary (Last 24 hours) at 07/28/2023 0806 Last data filed at 07/28/2023 0804 Gross per 24 hour  Intake 1670 ml  Output 1000  ml  Net 670 ml      Physical Exam    General:  Chronically ill appearing Cor: JVP ~9-10. Irregular rhythm. No murmur.  Lungs: clear Abdomen: soft, nontender, nondistended.  Extremities: no edema Neuro: Alert. + expressive aphasia.   Telemetry   Atrial fibrillation 60s-70s  Labs   Basic Metabolic Panel: Recent Labs  Lab 07/26/23 1613 07/27/23 0100 07/27/23 0918  NA 131* 133*  --   K 4.0 3.7  --   CL 97* 99  --   CO2 24 24  --   GLUCOSE 129* 91  --   BUN 28* 25*  --   CREATININE 1.53* 1.45*  --   CALCIUM 9.3 8.8*  --   MG  --   --  2.3    Liver Function Tests: Recent Labs  Lab 07/26/23 1613  AST 57*  ALT 40  ALKPHOS 70  BILITOT 1.6*  PROT 8.0  ALBUMIN 4.2   No results for input(s): LIPASE, AMYLASE in the last 168 hours. No results for input(s): AMMONIA in the last 168 hours.  CBC: Recent Labs  Lab 07/26/23 1613 07/27/23 0100  WBC 8.9 7.7  NEUTROABS 7.9*  --   HGB 13.3 11.9*  HCT 41.5 36.3*  MCV 91.0 91.0  PLT 112* 101*    Cardiac Enzymes: No results for input(s): CKTOTAL, CKMB, CKMBINDEX, TROPONINI in the last 168 hours.  BNP: BNP (last 3 results) Recent Labs    08/23/22 1107  BNP 347.6*    ProBNP (last 3 results) No results for input(s): PROBNP in the last 8760 hours.   CBG: Recent Labs  Lab 07/27/23 0601 07/27/23 1218 07/27/23 1634 07/27/23 2302 07/28/23 0630  GLUCAP 101* 82 126* 148* 91    Coagulation Studies: No results for input(s): LABPROT, INR in the last 72 hours.   Imaging   No results found.   Medications:     Current Medications:  apixaban   5 mg Oral BID   Chlorhexidine  Gluconate Cloth  6 each Topical Daily   insulin  aspart  0-9 Units Subcutaneous TID WC   linagliptin   5 mg Oral q morning   potassium chloride   20 mEq Oral Daily   simvastatin   40 mg Oral q1800   Tafamidis   1 capsule Oral Daily    Infusions:  ceFEPime  (MAXIPIME ) IV Stopped (07/27/23 2159)   milrinone  0.125  mcg/kg/min (07/27/23 0038)   vancomycin  Stopped (07/27/23 2323)      Patient Profile   68 y.o. male with history of  stage D chronic systolic CHF/NICM on home inotrope, cardiac amyloidosis, permanent atrial fibrillation/flutter, hx CVA with expressive aphasia and right-sided weakness.  Admitted with weakness, hypotension and concern for sepsis.  Assessment/Plan   Sepsis - Presented with weakness, lightheadedness, hypotension and fever with T 101.57F.  -UA, CXR and CT A/P okay -Respiratory panel negative -PCT 12>11.  -BC X 2 pending -PICC line recently replaced, unlikely to be source -Vanc + Cefepime  per primary  2. Chronic systolic CHF: Nonischemic cardiomyopathy known since 2018.  Improved initially in 2018 with DCCV, but has been back in atrial fibrillation since 2019 it appears and EF has been low. Cardiac MRI 2021 LV EF 24%, RV mildly dilated with EF 42%, extensive LGE in a non-coronary distribution throughout much of the LV, also involving RV free wall and left and right atrial walls, ECV 42%. This was suggestive of infiltrative disease. Echo 8/24 showed EF 25-30%, no LVH, moderate decreased RV systolic function, moderate MR, severe biatrial enlargement, IVC dilated.  - He failed milrinone  wean in 2021 and has been on home milrinone  0.125 mcg/kg/min since then. - Volume looks mildly elevated.  - Restart home spiro 25 mg daily, family reports his BP is near baseline. - Resume home Farxiga  as he does not appear to have UTI. - Would restart home torsemide , 30 mg daily, prior to discharge - BP too low for ARB/ARNi. - No digoxin  with history of bradycardia.   - Not a transplant candidate and not a good candidate for LVAD.  3. 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 - He is on tafamidis .   - He has also been started on eplontersen  with improvement in peripheral neuropathy symptoms.   4. Atrial  fibrillation/flutter: Now permanent. He has been relatively well rate controlled so do not think that cardiomyopathy is primarily due to AF.  Amiodarone  stopped due to bradycardia.  - Remains in atrial flutter with controlled rate - Continue Eliquis  5 mg bid  5. H/o CVA: In 2010, with chronic expressive aphasia and right-sided weakness.   6. CKD IIIa -Scr baseline 1.3-1.5, Scr 1.45 today  Contacted [employer] home infusion rep, Holley Herring, for resumption of home milrinone  at discharge. Resumption orders placed. HF TOC CM/CSW consulted to help coordinate discharge needs.  HF will sign off. Will arrange outpatient clinic follow-up.  Length of Stay: 1  Alantis Bethune N, PA-C  07/28/2023, 8:06 AM    Advanced Heart Failure Team Pager [phone] (M-F; 7a - 5p)  Please contact [facility] for night-coverage after hours (4p -7a ) and weekends on [email or web address]

## 2023-07-28 NOTE — Plan of Care (Signed)

## 2023-07-29 ENCOUNTER — Other Ambulatory Visit (HOSPITAL_COMMUNITY): Payer: Self-pay

## 2023-07-29 DIAGNOSIS — A419 Sepsis, unspecified organism: Secondary | ICD-10-CM | POA: Diagnosis not present

## 2023-07-29 LAB — BASIC METABOLIC PANEL WITH GFR
Anion gap: 9 (ref 5–15)
BUN: 18 mg/dL (ref 8–23)
CO2: 16 mmol/L — ABNORMAL LOW (ref 22–32)
Calcium: 8.3 mg/dL — ABNORMAL LOW (ref 8.9–10.3)
Chloride: 104 mmol/L (ref 98–111)
Creatinine, Ser: 1.14 mg/dL (ref 0.61–1.24)
GFR, Estimated: >60 mL/min (ref >60)
Glucose, Bld: 72 mg/dL (ref 70–99)
Potassium: 4.1 mmol/L (ref 3.5–5.1)
Sodium: 129 mmol/L — ABNORMAL LOW (ref 135–145)

## 2023-07-29 LAB — CBC WITH DIFFERENTIAL/PLATELET
Abs Immature Granulocytes: 0.03 K/uL (ref 0.00–0.07)
Basophils Absolute: 0 K/uL (ref 0.0–0.1)
Basophils Relative: 0 %
Eosinophils Absolute: 0.2 K/uL (ref 0.0–0.5)
Eosinophils Relative: 3 %
HCT: 35.5 % — ABNORMAL LOW (ref 39.0–52.0)
Hemoglobin: 11.7 g/dL — ABNORMAL LOW (ref 13.0–17.0)
Immature Granulocytes: 1 %
Lymphocytes Relative: 16 %
Lymphs Abs: 0.8 K/uL (ref 0.7–4.0)
MCH: 29.8 pg (ref 26.0–34.0)
MCHC: 33 g/dL (ref 30.0–36.0)
MCV: 90.3 fL (ref 80.0–100.0)
Monocytes Absolute: 0.4 K/uL (ref 0.1–1.0)
Monocytes Relative: 9 %
Neutro Abs: 3.7 K/uL (ref 1.7–7.7)
Neutrophils Relative %: 71 %
Platelets: 78 K/uL — ABNORMAL LOW (ref 150–400)
RBC: 3.93 MIL/uL — ABNORMAL LOW (ref 4.22–5.81)
RDW: 14.4 % (ref 11.5–15.5)
WBC: 5.2 K/uL (ref 4.0–10.5)
nRBC: 0 % (ref 0.0–0.2)

## 2023-07-29 LAB — GLUCOSE, CAPILLARY: Glucose-Capillary: 172 mg/dL — ABNORMAL HIGH (ref 70–99)

## 2023-07-29 LAB — MAGNESIUM: Magnesium: 2 mg/dL (ref 1.7–2.4)

## 2023-07-29 MED ORDER — TRADJENTA 5 MG PO TABS
5.0000 mg | ORAL_TABLET | Freq: Every day | ORAL | 0 refills | Status: DC
  Filled 2023-07-29: qty 30, 30d supply, fill #0

## 2023-07-29 MED ORDER — VANCOMYCIN HCL IN DEXTROSE 1-5 GM/200ML-% IV SOLN: 1000.0000 mg | INTRAVENOUS | Status: DC

## 2023-07-29 MED ORDER — MILRINONE LACTATE IN DEXTROSE 20-5 MG/100ML-% IV SOLN
0.1250 ug/kg/min | INTRAVENOUS | 0 refills | Status: DC
  Filled 2023-07-29: qty 100, fill #0

## 2023-07-29 NOTE — Progress Notes (Signed)
 Pharmacy Antibiotic Note  [name] is a 68 y.o. male admitted on 07/26/2023 with sepsis.  Pharmacy has been consulted for vancomycin  & cefepime  dosing. WBC wnl, afebrile, will continue antibiotic treatment for now with procalcitonin 12>11 Renal function improving crcl 50ml/min  Vancomycin  dosing based on Cr 1.5 - will adjust dose with improved renal function  Plan: Cefepime  2 gm IV q12 hours  Increase vancomycin  1GM IV q24 for eAUC 500   Height: 5' 7 (170.2 cm) Weight: 59.7 kg (131 lb 9.6 oz) IBW/kg (Calculated) : 66.1  Temp (24hrs), Avg:98.8 F (37.1 C), Min:97.6 F (36.4 C), Max:100 F (37.8 C)  Recent Labs  Lab 07/26/23 1613 07/26/23 1623 07/26/23 1901 07/27/23 0100 07/28/23 0907 07/29/23 0331  WBC 8.9  --   --  7.7 6.1  --   CREATININE 1.53*  --   --  1.45* 1.07 1.14  LATICACIDVEN  --  1.1 1.2  --   --   --     Estimated Creatinine Clearance: 53.1 mL/min (by C-G formula based on SCr of 1.14 mg/dL).    No Known Allergies   Olam Chalk Pharm.D. CPP, BCPS Clinical Pharmacist [phone] 07/29/2023 9:03 AM

## 2023-07-29 NOTE — Plan of Care (Signed)
  Problem: Education: Goal: Ability to describe self-care measures that may prevent or decrease complications (Diabetes Survival Skills Education) will improve Outcome: Adequate for Discharge   Problem: Coping: Goal: Ability to adjust to condition or change in health will improve Outcome: Adequate for Discharge   Problem: Fluid Volume: Goal: Ability to maintain a balanced intake and output will improve Outcome: Adequate for Discharge   Problem: Metabolic: Goal: Ability to maintain appropriate glucose levels will improve Outcome: Adequate for Discharge   Problem: Nutritional: Goal: Maintenance of adequate nutrition will improve Outcome: Adequate for Discharge Goal: Progress toward achieving an optimal weight will improve Outcome: Adequate for Discharge   Problem: Clinical Measurements: Goal: Ability to maintain clinical measurements within normal limits will improve Outcome: Adequate for Discharge Goal: Will remain free from infection Outcome: Adequate for Discharge Goal: Diagnostic test results will improve Outcome: Adequate for Discharge Goal: Respiratory complications will improve Outcome: Adequate for Discharge Goal: Cardiovascular complication will be avoided Outcome: Adequate for Discharge

## 2023-07-29 NOTE — Inpatient Diabetes Management (Signed)
 Inpatient Diabetes Program Recommendations  AACE/ADA: New Consensus Statement on Inpatient Glycemic Control (2015)  Target Ranges:  Prepandial:   less than 140 mg/dL      Peak postprandial:   less than 180 mg/dL (1-2 hours)      Critically ill patients:  140 - 180 mg/dL   Lab Results  Component Value Date   GLUCAP 172 (H) 07/29/2023   HGBA1C 6.0 (H) 07/27/2023    Review of Glycemic Control  Latest Reference Range & Units 07/28/23 12:11 07/28/23 17:14 07/28/23 18:33 07/28/23 21:28 07/29/23 06:33  Glucose-Capillary 70 - 99 mg/dL 742 (H) 60 (L) 94 887 (H) 172 (H)  (H): Data is abnormally high (L): Data is abnormally low Diabetes history: Type 2 DM Outpatient Diabetes medications: Farxiga  10 mg every day, Tradjenta  5 mg QD Current orders for Inpatient glycemic control: Tradjenta  5 mg every day, Novolog  0-9 untis TID  Inpatient Diabetes Program Recommendations:    Noted hypoglycemia yesterday of 60 mg/dL. This was following Novolog  5 units correction. Consider decreasing correction to Novolog  0-6 units TID.   Thanks, Tinnie Minus, MSN, RNC-OB Diabetes Coordinator [phone] (8a-5p)

## 2023-07-29 NOTE — Discharge Summary (Signed)
 Physician Discharge Summary  [name] FMW:[id number] DOB: [birth year] DOA: 07/26/2023  PCP: Alec House, MD  Admit date: 07/26/2023 Discharge date: 07/29/2023 30 Day Unplanned Readmission Risk Score    Flowsheet Row ED to Hosp-Admission (Current) from 07/26/2023 in [facility] 4E CV SURGICAL PROGRESSIVE CARE  30 Day Unplanned Readmission Risk Score (%) 13.97 Filed at 07/29/2023 0801    This score is the patient's risk of an unplanned readmission within 30 days of being discharged (0 -100%). The score is based on dignosis, age, lab data, medications, orders, and past utilization.   Low:  0-14.9   Medium: 15-21.9   High: 22-29.9   Extreme: 30 and above          Admitted From: Home Disposition: Home  Recommendations for Outpatient Follow-up:  Follow up with PCP in 1-2 weeks Please obtain BMP/CBC in one week Follow-up with primary cardiologist in 1 to 2 weeks. Please follow up with your PCP on the following pending results: Unresulted Labs (From admission, onward)     Start     Ordered   07/29/23 0500  Basic metabolic panel  Daily,   R     Comments: While on Milrinone    Question:  Specimen collection method  Answer:  Lab=Lab collect   07/28/23 1011   07/29/23 0500  Magnesium   Daily,   R     Comments: While on Milrinone    Question:  Specimen collection method  Answer:  Lab=Lab collect   07/28/23 1011              Home Health: None Equipment/Devices: None  Discharge Condition: Stable CODE STATUS: Full code Diet recommendation: Cardiac  HPI: [name] is a 68 y.o. male with medical history significant of hx of CVA with aphasia,end stage NICM, HLD, T2DM, permanent atrial fibrillation who presented to ED with complaints of weakness. His niece gives history. This morning she could tell he was off. He didn't feel great. She checked his vitals and his blood pressure was 87/70 which she says was low for him. He usually runs around 100/70. He stayed in bed all morning and  this is not like him at all. He told his niece he felt light headed and dizzy. He also stated he had some pain with urination. No fevers/chills at home. He had PICC line placed last week in his chest. His PICC line in his arm had come out and he needed a new PICC line placed.    He has no urinary urgency or frequency and has no problems urinating. He does endorse some dysuria and some supra pubic pain. Urine looks normal and has no odor.       Denies any fever/chills, vision changes/headaches, chest pain or palpitations, shortness of breath or cough, abdominal pain, N/V/D, or leg swelling.    He does not smoke or drink alcohol.      ER Course:  vitals: temp: 101.2, bp: 95/67, HR: 79, RR: 18>25, oxygen: 96%RA Pertinent labs: platelets 112, sodium: 131, BUN 28, creatinine: 1.53,  Lactic acid: 1.1>1.2,  CXR: no acute finding CT abdomen/pelvis: no acute finding. No excretion of IV contrast on delayed view from either kidneys. Correlate with renal function test. Cardiomegaly. Mild hepatic steatosis, aortic atherosclerosis.  In ED: given 500cc IVF bolus, BC and urine culture obtained. Rocpehin given. TRH asked to admit.   Subjective: Seen and examined, niece at the bedside.  Patient alert and oriented at his baseline and has no complaints, he is smiling and pleasant.  Niece  at the bedside is also convinced about patient feeling better and back at baseline.  Discussed plan of discharge with her and she is in agreement.  Details below.  Brief/Interim Summary:   sepsis ruled out since there is no clear source of infection.  SIRS ruled in, POA: 68 year old male presenting to ED with complaints of weakness, lightheadedness and dysuria found to have fever to 101.2 and tachypnea with unclear source at time of admission.  Workup negative so far including CT abdomen and pelvis, chest x-ray as well as UA.  No leukocytosis.  Last temperature spike of 101.2 At 2:30 PM 07/26/2023.  PICC line was the suspected  source, cultures drawn, admitted to hospital service and continued on antibiotics.  However since culture remained negative and patient remained afebrile and improved with his weakness and overall condition and back to baseline so there is no indication to continue antibiotics or hospitalization.  Discussed with the niece in detail, based on the data available, we plan to discharge home without any antibiotics.  However she has been clearly advised to keep a close eye on patient's temperature and other vitals as well as oral clinical condition.  And to reach out to PCP or cardiologist if he happens to have further signs of infection.   Chronic hyponatremia Around baseline, 131-134, at baseline.   end stage NICM (nonischemic cardiomyopathy) (HCC) Echo 08/2022: EF 25-30%, no LVH, moderate decreased RV systolic function, moderate MR, severe biatrial enlargement, IVC dilated. On continuous home milirone infusion, Not a transplant candidate or LVAD candidate, cardiology on-call was consulted and they recommended continuing home milrinone  infusion with no changes.  Aldactone  resumed.  Patient seen by heart failure team.  They recommended discontinuing Farxiga  at discharge.  Cardiology will reassess this in the clinic and outpatient.  Resuming torsemide  per cardiology recommendation.   Permanent atrial fibrillation (HCC) Now permanent.  Continue eliquis   Initially in 2018, EF improved after DCCV.  However, as above, he has been relatively well rate controlled and has not been in RVR, so do not think that cardiomyopathy is primarily due to AF.  He failed DCCV on amiodarone  10/21, amiodarone  stopped due to bradycardia. 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    Cardiac amyloidosis (HCC) continue tafamidis . Continue eplontersen  with improvement in peripheral neuropathy symptoms.    Controlled type 2 diabetes mellitus without complication, without long-term current use of insulin  (HCC) A1C of 5.4 in  2021 Tradjenta  and farxiga   Holding farxiga  until orthostatics result  SSI and accuchecks QAC/HS    CKD stage 3a, GFR 45-59 ml/min (HCC) Baseline creatinine 1.4-1.5 stable   Essential hypertension Soft bp, improved and back to baseline.  Resuming Aldactone  and torsemide ,   History of stroke Baseline aphasia with right hemiparesis, stable. On eliquis /statin    HLD (hyperlipidemia) Continue zocor  40mg  daily   Discharge plan was discussed with patient and/or family member and they verbalized understanding and agreed with it.  Discharge Diagnoses:  Principal Problem:   Sepsis (HCC) Active Problems:   Hyponatremia   end stage NICM (nonischemic cardiomyopathy) (HCC)   Permanent atrial fibrillation (HCC)   Cardiac amyloidosis (HCC)   Controlled type 2 diabetes mellitus without complication, without long-term current use of insulin  (HCC)   CKD stage 3a, GFR 45-59 ml/min (HCC)   Essential hypertension   History of stroke   HLD (hyperlipidemia)    Discharge Instructions   Allergies as of 07/29/2023   No Known Allergies      Medication List  STOP taking these medications    Farxiga  10 MG Tabs tablet Generic drug: dapagliflozin  propanediol       TAKE these medications    acetaminophen  500 MG tablet Commonly known as: TYLENOL  Take 1,000 mg by mouth every 6 (six) hours as needed for pain.   Eliquis  5 MG Tabs tablet Generic drug: apixaban  TAKE 1 TABLET(5 MG) BY MOUTH TWICE DAILY   FISH OIL PO Take 1 capsule by mouth daily.   linagliptin  5 MG Tabs tablet Commonly known as: TRADJENTA  Take 5 mg by mouth daily.   Tradjenta  5 MG Tabs tablet Generic drug: linagliptin  Take 1 tablet (5 mg total) by mouth daily.   loratadine  10 MG tablet Commonly known as: CLARITIN  Take 10 mg by mouth daily as needed for allergies.   milrinone  20 MG/100 ML Soln infusion Commonly known as: PRIMACOR  Inject 0.0139 mg/min into the vein continuous. What changed:  how much to  take when to take this   potassium chloride  10 MEQ tablet Commonly known as: KLOR-CON  Take 2 tablets (20 mEq total) by mouth daily. What changed:  how much to take when to take this   simvastatin  40 MG tablet Commonly known as: ZOCOR  Take 1 tablet (40 mg total) by mouth daily. What changed: when to take this   spironolactone  25 MG tablet Commonly known as: ALDACTONE  Take 1 tablet (25 mg total) by mouth daily.   torsemide  20 MG tablet Commonly known as: DEMADEX  Take 1.5 tablets (30 mg total) by mouth daily.   Vyndamax  61 MG Caps Generic drug: Tafamidis  Take 1 capsule by mouth daily.   [name]  45 MG/0.8ML Soaj Generic drug: Eplontersen  Sodium Inject 45 mg into the skin every 30 (thirty) days. What changed: additional instructions               Durable Medical Equipment  (From admission, onward)           Start     Ordered   07/28/23 1230  Heart failure home health orders  (Heart failure home health orders / Face to face)  Once       Comments: Heart Failure Follow-up Care:  Verify follow-up appointments per Patient Discharge Instructions. Confirm transportation arranged. Reconcile home medications with discharge medication list. Remove discontinued medications from use. Assist patient/caregiver to manage medications using pill box. Reinforce low sodium food selection Assessments: Vital signs and oxygen saturation at each visit. Assess home environment for safety concerns, caregiver support and availability of low-sodium foods. Consult Child psychotherapist, PT/OT, Dietitian, and CNA based on assessments. Perform comprehensive cardiopulmonary assessment. Notify MD for any change in condition or weight gain of 3 pounds in one day or 5 pounds in one week with symptoms. Daily Weights and Symptom Monitoring: Ensure patient has access to scales. Teach patient/caregiver to weigh daily before breakfast and after voiding using same scale and record.    Teach patient/caregiver  to track weight and symptoms and when to notify Provider. Activity: Develop individualized activity plan with patient/caregiver.   Home Paraenteral Inotropic Therapy : Data Collection Form  Patients name: Nareg Breighner   Date: 07/28/23  Information below may not be completed by the supplier nor anyone in a Financial relationship with the supplier.  NYHA IV  Home inotrope resumption order Drug and Dose:   Milrinone  0.125 mcg/kg/min for continuous infusion 24/hr day and 7 days/week   Completed by [facility], LINDSAY N, PA-C   In instances where this form was completed by an Advanced Practice Provider, please see EMR  for physician Co-Signature.   [facility] to provide  Labs every other week to include BMET, Mg, and CBC with Diff. Additional as needed. Should be drawn via PERIPHERAL stick. NOT PICC line.   [id number] Milrinone  0.125 mcg/kg/min X 52 weeks A4221 Supplies for maintenance of drug infusion catheter A4222 Supplies for the external drug infusion per cassette or bag E0781 Ambulatory Infusion pump  Question Answer Comment  Heart Failure Follow-up Care [facility] at [phone]   Lab frequency Other see comments   Fax lab results to [facility] at [phone]   Diet Low Sodium Heart Healthy   Fluid restrictions: 1800 mL Fluid   Skilled Nurse to notify MD of weight trends weekly for first 2 weeks. May fax or call: [facility] at [phone] (fax) or [phone]      07/28/23 1233            Follow-up Information     [facility] Follow up on 08/08/2023.   Specialty: Cardiology Why: [facility] 3 PM Entrance C, Free Valet Parking Contact information: [address] [place]  [address] [phone]        Alec House, MD Follow up in 1 week(s).   Specialty: Family Medicine Contact information: [address] [place] [address] [phone]                No  Known Allergies  Consultations: Cardiology   Procedures/Studies: CT ABDOMEN PELVIS W CONTRAST Result Date: 07/26/2023 CLINICAL DATA:  Abdominal pain, acute, nonlocalized EXAM: CT ABDOMEN AND PELVIS WITH CONTRAST TECHNIQUE: Multidetector CT imaging of the abdomen and pelvis was performed using the standard protocol following bolus administration of intravenous contrast. RADIATION DOSE REDUCTION: This exam was performed according to the departmental dose-optimization program which includes automated exposure control, adjustment of the mA and/or kV according to patient size and/or use of iterative reconstruction technique. CONTRAST:  80mL OMNIPAQUE  IOHEXOL  300 MG/ML  SOLN COMPARISON:  CT abdomen pelvis 11/05/2019 FINDINGS: Lower chest: Cardiomegaly.  No acute abnormality. Hepatobiliary: The hepatic parenchyma is diffusely hypodense compared to the splenic parenchyma consistent with fatty infiltration. No focal liver abnormality. No gallstones, gallbladder wall thickening, or pericholecystic fluid. No biliary dilatation. Pancreas: No focal lesion. Normal pancreatic contour. No surrounding inflammatory changes. No main pancreatic ductal dilatation. Spleen: Normal in size without focal abnormality. Adrenals/Urinary Tract: No adrenal nodule bilaterally. Bilateral kidneys enhance symmetrically. No hydronephrosis. No hydroureter. No nephroureterolithiasis bilaterally. Subcentimeter hypodensities are too small to characterize-no further follow-up indicated. The urinary bladder is unremarkable. No excretion of intravenous contrast on delayed view from either kidneys. Stomach/Bowel: Stomach is within normal limits. No evidence of bowel wall thickening or dilatation. Appendix appears normal. Vascular/Lymphatic: No abdominal aorta or iliac aneurysm. Moderate atherosclerotic plaque of the aorta and its branches. No abdominal, pelvic, or inguinal lymphadenopathy. Reproductive: Prostate is unremarkable. Other: No  intraperitoneal free fluid. No intraperitoneal free gas. No organized fluid collection. Musculoskeletal: No abdominal wall hernia or abnormality. No suspicious lytic or blastic osseous lesions. No acute displaced fracture. Multilevel degenerative changes of the spine. IMPRESSION: 1. No acute intra-abdominal or intrapelvic abnormality. 2. No excretion of intravenous contrast on delayed view from either kidneys. Correlate with renal function test. 3. Cardiomegaly. 4. Mild hepatic steatosis. 5.  Aortic Atherosclerosis (ICD10-I70.0). Electronically Signed   By: Morgane  Naveau M.D.   On: 07/26/2023 19:27   DG Chest Portable 1 View Result Date: 07/26/2023 CLINICAL DATA:  Decreased activity.  Reported dysuria. EXAM: PORTABLE CHEST 1 VIEW COMPARISON:  06/20/2022 and older exams. FINDINGS: Mild stable enlargement of the cardiac silhouette. No mediastinal or hilar masses. Clear lungs.  No pleural effusion or pneumothorax. Right anterior chest wall central venous catheter extends to the internal jugular, tip in the mid superior vena cava. Skeletal structures are grossly intact. IMPRESSION: No active disease. Electronically Signed   By: Alm Parkins M.D.   On: 07/26/2023 17:30   IR Fluoro Guide CV Line Right Result Date: 07/18/2023 INDICATION: See report for central venous access line. EXAM: See previous report ANESTHESIA/SEDATION: . COMPLICATIONS: None immediate. PROCEDURE: See previous report IMPRESSION: See previous report Electronically Signed   By: Cordella Banner   On: 07/18/2023 18:38   IR US  Guide Vasc Access Right Result Date: 07/18/2023 INDICATION: See report for central venous access line. EXAM: See previous report ANESTHESIA/SEDATION: . COMPLICATIONS: None immediate. PROCEDURE: See previous report IMPRESSION: See previous report Electronically Signed   By: Cordella Banner   On: 07/18/2023 18:38   IR CV Line Injection Result Date: 07/18/2023 INDICATION: Durable venous access in needed for long-term  intravenous therapy in a patient with poor IV access. The patient was scheduled to have a PICC line exchange, however secondary to severe vaso spasm the catheter could not be accessed and exchanged appropriately. Despite numerous attempts, access was abandoned and a new left upper extremity access was attempted also without success. The plan was then for a tunneled PICC line in the right internal jugular vein. EXAM: Right-sided central venous catheter placement MEDICATIONS: None; The antibiotic was administered within an appropriate time interval prior to skin puncture. ANESTHESIA/SEDATION: Moderate (conscious) sedation was employed during this procedure. A total of Versed  0 mg and Fentanyl  0 mcg was administered intravenously by the radiology nurse. Total intra-service moderate Sedation Time: 0 minutes. The patient's level of consciousness and vital signs were monitored continuously by radiology nursing throughout the procedure under my direct supervision. FLUOROSCOPY: Radiation Exposure Index (as provided by the fluoroscopic device): 111 mGy Kerma COMPLICATIONS: None immediate. PROCEDURE: Informed written consent was obtained from the patient after a thorough discussion of the procedural risks, benefits and alternatives. All questions were addressed. Maximal Sterile Barrier Technique was utilized including caps, mask, sterile gowns, sterile gloves, sterile drape, hand hygiene and skin antiseptic. A timeout was performed prior to the initiation of the procedure. The left upper extremity was prepped and draped in usual sterile fashion and a guidewire was advanced through the pre-existing PICC line. PICC line was completely removed from the patient and evaluated for length. Peel-away sheath was then advanced into the access site and the new double lumen PICC line was cut to similar length. Multiple attempts were then made to advance the new double lumen PICC line through the peel-away sheath and place the tip at the  cavoatrial junction without success. Venogram of the left subclavian vein was performed demonstrating severe basal spasm throughout the subclavian and axillary veins. Guidewire was then advanced to the IVC for more purchase and again the catheter could not be advanced. Double wire technique was also attempted without success. Eventually this access was abandoned. Ultrasound of the left arm demonstrates cephalic vein patency. This vessel was at accessed with ultrasound and a guidewire was selected and again a guidewire was advanced, however could not be placed at the cavoatrial junction and this access site was abandoned Ultrasound of the right internal jugular vein demonstrated the jugular vein to be anechoic and compressible indicating patency. The region was  infiltrated with 1% lidocaine  for local anesthesia. Small incision was created in the needle was advanced under ultrasound guidance until the needle tip was identified within the jugular vein. Final image was obtained and stored the patient's permanent medical record. Access was exchanged under fluoroscopy for an 018 guidewire and a peel-away sheath. The PICC line was then cut to 23 cm. The catheter was tunneled in the subcutaneous tissue near the right pectoral groove and then advanced through the peel-away sheath the stability sheath was completely removed from the patient. Final image was obtained demonstrating the dual lumen 6 French 23 cm tunneled central venous catheter access. The catheter was then capped and flushed as per protocol. Retention suture and sterile dressing applied. IMPRESSION: Unsuccessful attempt at exchanging the dual lumen PICC line in the left upper extremity. Unsuccessful attempt at new access to the left arm for PICC line placement. Successful placement of a 23 cm tunneled right internal jugular vein PICC line with the tip at the cavoatrial junction and ready for use. Electronically Signed   By: Cordella Banner   On: 07/18/2023  18:28     Discharge Exam: Vitals:   07/29/23 0800 07/29/23 0900  BP: 90/63   Pulse: 64 66  Resp: (!) 25 (!) 22  Temp:    SpO2: 98%    Vitals:   07/29/23 0637 07/29/23 0700 07/29/23 0800 07/29/23 0900  BP:  90/63 90/63   Pulse: 68 60 64 66  Resp: 20 (!) 21 (!) 25 (!) 22  Temp:  98.7 F (37.1 C)    TempSrc:  Oral    SpO2: 99% 100% 98%   Weight: 59.7 kg     Height:        General: Pt is alert, awake, not in acute distress Cardiovascular: RRR, S1/S2 +, no rubs, no gallops Respiratory: CTA bilaterally, no wheezing, no rhonchi Abdominal: Soft, NT, ND, bowel sounds + Extremities: no edema, no cyanosis, right hemiparesis from previous stroke.    The results of significant diagnostics from this hospitalization (including imaging, microbiology, ancillary and laboratory) are listed below for reference.     Microbiology: Recent Results (from the past 240 hours)  Culture, blood (routine x 2)     Status: None (Preliminary result)   Collection Time: 07/26/23  4:13 PM   Specimen: BLOOD LEFT ARM  Result Value Ref Range Status   Specimen Description   Final    BLOOD LEFT ARM Performed at [facility], [address], [place], [place] [address]    Special Requests   Final    BOTTLES DRAWN AEROBIC AND ANAEROBIC Blood Culture adequate volume Performed at [facility], [address], [place], [place] [address]    Culture   Final    NO GROWTH 3 DAYS Performed at [facility], [address], [place], [place] [address]    Report Status PENDING  Incomplete  Resp panel by RT-PCR (RSV, Flu A&B, Covid) Anterior Nasal Swab     Status: None   Collection Time: 07/26/23  5:45 PM   Specimen: Anterior Nasal Swab  Result Value Ref Range Status   SARS Coronavirus 2 by RT PCR NEGATIVE NEGATIVE Final    Comment: (NOTE) SARS-CoV-2 target nucleic acids are NOT DETECTED.  The SARS-CoV-2 RNA is generally detectable in upper respiratory specimens during the acute  phase of infection. The lowest concentration of SARS-CoV-2 viral copies this assay can detect is 138 copies/mL. A negative result does not preclude SARS-Cov-2 infection  and should not be used as the sole basis for treatment or other patient management decisions. A negative result may occur with  improper specimen collection/handling, submission of specimen other than nasopharyngeal swab, presence of viral mutation(s) within the areas targeted by this assay, and inadequate number of viral copies(<138 copies/mL). A negative result must be combined with clinical observations, patient history, and epidemiological information. The expected result is Negative.  Fact Sheet for Patients:  BloggerCourse.com  Fact Sheet for Healthcare Providers:  SeriousBroker.it  This test is no t yet approved or cleared by the [place]  FDA and  has been authorized for detection and/or diagnosis of SARS-CoV-2 by FDA under an Emergency Use Authorization (EUA). This EUA will remain  in effect (meaning this test can be used) for the duration of the COVID-19 declaration under Section 564(b)(1) of the Act, 21 U.S.C.section 360bbb-3(b)(1), unless the authorization is terminated  or revoked sooner.       Influenza A by PCR NEGATIVE NEGATIVE Final   Influenza B by PCR NEGATIVE NEGATIVE Final    Comment: (NOTE) The Xpert Xpress SARS-CoV-2/FLU/RSV plus assay is intended as an aid in the diagnosis of influenza from Nasopharyngeal swab specimens and should not be used as a sole basis for treatment. Nasal washings and aspirates are unacceptable for Xpert Xpress SARS-CoV-2/FLU/RSV testing.  Fact Sheet for Patients: BloggerCourse.com  Fact Sheet for Healthcare Providers: SeriousBroker.it  This test is not yet approved or cleared by the [place]  FDA and has been authorized for detection and/or diagnosis of  SARS-CoV-2 by FDA under an Emergency Use Authorization (EUA). This EUA will remain in effect (meaning this test can be used) for the duration of the COVID-19 declaration under Section 564(b)(1) of the Act, 21 U.S.C. section 360bbb-3(b)(1), unless the authorization is terminated or revoked.     Resp Syncytial Virus by PCR NEGATIVE NEGATIVE Final    Comment: (NOTE) Fact Sheet for Patients: BloggerCourse.com  Fact Sheet for Healthcare Providers: SeriousBroker.it  This test is not yet approved or cleared by the [place]  FDA and has been authorized for detection and/or diagnosis of SARS-CoV-2 by FDA under an Emergency Use Authorization (EUA). This EUA will remain in effect (meaning this test can be used) for the duration of the COVID-19 declaration under Section 564(b)(1) of the Act, 21 U.S.C. section 360bbb-3(b)(1), unless the authorization is terminated or revoked.  Performed at [facility], [address] [place], [place] [address]   Respiratory (~20 pathogens) panel by PCR     Status: None   Collection Time: 07/26/23  8:28 PM   Specimen: Nasopharyngeal Swab; Respiratory  Result Value Ref Range Status   Adenovirus NOT DETECTED NOT DETECTED Final   Coronavirus 229E NOT DETECTED NOT DETECTED Final    Comment: (NOTE) The Coronavirus on the Respiratory Panel, DOES NOT test for the novel  Coronavirus (2019 nCoV)    Coronavirus HKU1 NOT DETECTED NOT DETECTED Final   Coronavirus NL63 NOT DETECTED NOT DETECTED Final   Coronavirus OC43 NOT DETECTED NOT DETECTED Final   Metapneumovirus NOT DETECTED NOT DETECTED Final   Rhinovirus / Enterovirus NOT DETECTED NOT DETECTED Final   Influenza A NOT DETECTED NOT DETECTED Final   Influenza B NOT DETECTED NOT DETECTED Final   Parainfluenza Virus 1 NOT DETECTED NOT DETECTED Final   Parainfluenza Virus 2 NOT DETECTED NOT DETECTED Final   Parainfluenza Virus 3 NOT  DETECTED NOT DETECTED Final   Parainfluenza Virus 4 NOT DETECTED NOT DETECTED Final   Respiratory Syncytial  Virus NOT DETECTED NOT DETECTED Final   Bordetella pertussis NOT DETECTED NOT DETECTED Final   Bordetella Parapertussis NOT DETECTED NOT DETECTED Final   Chlamydophila pneumoniae NOT DETECTED NOT DETECTED Final   Mycoplasma pneumoniae NOT DETECTED NOT DETECTED Final    Comment: Performed at [facility], [address] [place], [place] [address]  Urine Culture     Status: Abnormal   Collection Time: 07/26/23  8:57 PM   Specimen: Urine, Clean Catch  Result Value Ref Range Status   Specimen Description   Final    URINE, CLEAN CATCH Performed at [facility], [address] [place], [place] [address]    Special Requests   Final    NONE Performed at [facility], [address] [place], [place] [address]    Culture (A)  Final    <10,000 COLONIES/mL INSIGNIFICANT GROWTH Performed at [facility], [address], [place], [place] [address]    Report Status 07/27/2023 FINAL  Final  Culture, blood (routine x 2)     Status: None (Preliminary result)   Collection Time: 07/26/23  9:56 PM   Specimen: BLOOD  Result Value Ref Range Status   Specimen Description   Final    BLOOD LEFT ANTECUBITAL Performed at [facility], [address], [place], [place] [address]    Special Requests   Final    BOTTLES DRAWN AEROBIC AND ANAEROBIC Blood Culture adequate volume Performed at [facility], [address] [place], [place] [address]    Culture   Final    NO GROWTH 2 DAYS Performed at [facility], [address], [place], [place] [address]    Report Status PENDING  Incomplete     Labs: BNP (last 3 results) Recent Labs    08/23/22 1107  BNP 347.6*   Basic Metabolic Panel: Recent Labs  Lab 07/26/23 1613 07/27/23 0100 07/27/23 0918 07/28/23 0907 07/29/23 0331  NA 131* 133*  --   132* 129*  K 4.0 3.7  --  4.4 4.1  CL 97* 99  --  102 104  CO2 24 24  --  20* 16*  GLUCOSE 129* 91  --  98 72  BUN 28* 25*  --  17 18  CREATININE 1.53* 1.45*  --  1.07 1.14  CALCIUM 9.3 8.8*  --  8.4* 8.3*  MG  --   --  2.3  --  2.0   Liver Function Tests: Recent Labs  Lab 07/26/23 1613  AST 57*  ALT 40  ALKPHOS 70  BILITOT 1.6*  PROT 8.0  ALBUMIN 4.2   No results for input(s): LIPASE, AMYLASE in the last 168 hours. No results for input(s): AMMONIA in the last 168 hours. CBC: Recent Labs  Lab 07/26/23 1613 07/27/23 0100 07/28/23 0907 07/29/23 0925  WBC 8.9 7.7 6.1 5.2  NEUTROABS 7.9*  --   --  PENDING  HGB 13.3 11.9* 11.5* 11.7*  HCT 41.5 36.3* 36.2* 35.5*  MCV 91.0 91.0 91.4 90.3  PLT 112* 101* 80* PENDING   Cardiac Enzymes: No results for input(s): CKTOTAL, CKMB, CKMBINDEX, TROPONINI in the last 168 hours. BNP: Invalid input(s): POCBNP CBG: Recent Labs  Lab 07/28/23 1211 07/28/23 1714 07/28/23 1833 07/28/23 2128 07/29/23 0633  GLUCAP 257* 60* 94 112* 172*   D-Dimer No results for input(s): DDIMER in the last 72 hours. Hgb A1c Recent Labs    07/27/23 0918  HGBA1C 6.0*  Lipid Profile No results for input(s): CHOL, HDL, LDLCALC, TRIG, CHOLHDL, LDLDIRECT in the last 72 hours. Thyroid function studies Recent Labs    07/27/23 0918  TSH 1.292   Anemia work up No results for input(s): VITAMINB12, FOLATE, FERRITIN, TIBC, IRON, RETICCTPCT in the last 72 hours. Urinalysis    Component Value Date/Time   COLORURINE YELLOW 07/26/2023 1525   APPEARANCEUR CLEAR 07/26/2023 1525   LABSPEC 1.008 07/26/2023 1525   PHURINE 5.0 07/26/2023 1525   GLUCOSEU >=500 (A) 07/26/2023 1525   HGBUR NEGATIVE 07/26/2023 1525   BILIRUBINUR NEGATIVE 07/26/2023 1525   KETONESUR NEGATIVE 07/26/2023 1525   PROTEINUR NEGATIVE 07/26/2023 1525   UROBILINOGEN 1.0 11/17/2008 1539   NITRITE NEGATIVE 07/26/2023 1525   LEUKOCYTESUR  NEGATIVE 07/26/2023 1525   Sepsis Labs Recent Labs  Lab 07/26/23 1613 07/27/23 0100 07/28/23 0907 07/29/23 0925  WBC 8.9 7.7 6.1 5.2   Microbiology Recent Results (from the past 240 hours)  Culture, blood (routine x 2)     Status: None (Preliminary result)   Collection Time: 07/26/23  4:13 PM   Specimen: BLOOD LEFT ARM  Result Value Ref Range Status   Specimen Description   Final    BLOOD LEFT ARM Performed at [facility], [address], [place], [place] [address]    Special Requests   Final    BOTTLES DRAWN AEROBIC AND ANAEROBIC Blood Culture adequate volume Performed at [facility], [address], [place], [place] [address]    Culture   Final    NO GROWTH 3 DAYS Performed at [facility], [address] [place], [place] [address]    Report Status PENDING  Incomplete  Resp panel by RT-PCR (RSV, Flu A&B, Covid) Anterior Nasal Swab     Status: None   Collection Time: 07/26/23  5:45 PM   Specimen: Anterior Nasal Swab  Result Value Ref Range Status   SARS Coronavirus 2 by RT PCR NEGATIVE NEGATIVE Final    Comment: (NOTE) SARS-CoV-2 target nucleic acids are NOT DETECTED.  The SARS-CoV-2 RNA is generally detectable in upper respiratory specimens during the acute phase of infection. The lowest concentration of SARS-CoV-2 viral copies this assay can detect is 138 copies/mL. A negative result does not preclude SARS-Cov-2 infection and should not be used as the sole basis for treatment or other patient management decisions. A negative result may occur with  improper specimen collection/handling, submission of specimen other than nasopharyngeal swab, presence of viral mutation(s) within the areas targeted by this assay, and inadequate number of viral copies(<138 copies/mL). A negative result must be combined with clinical observations, patient history, and epidemiological information. The expected result is Negative.  Fact Sheet for  Patients:  BloggerCourse.com  Fact Sheet for Healthcare Providers:  SeriousBroker.it  This test is no t yet approved or cleared by the [place]  FDA and  has been authorized for detection and/or diagnosis of SARS-CoV-2 by FDA under an Emergency Use Authorization (EUA). This EUA will remain  in effect (meaning this test can be used) for the duration of the COVID-19 declaration under Section 564(b)(1) of the Act, 21 U.S.C.section 360bbb-3(b)(1), unless the authorization is terminated  or revoked sooner.       Influenza A by PCR NEGATIVE NEGATIVE Final   Influenza B by PCR NEGATIVE NEGATIVE Final    Comment: (NOTE) The Xpert Xpress SARS-CoV-2/FLU/RSV plus assay is intended as an aid in the diagnosis of influenza from Nasopharyngeal swab specimens and should not be  used as a sole basis for treatment. Nasal washings and aspirates are unacceptable for Xpert Xpress SARS-CoV-2/FLU/RSV testing.  Fact Sheet for Patients: BloggerCourse.com  Fact Sheet for Healthcare Providers: SeriousBroker.it  This test is not yet approved or cleared by the [place]  FDA and has been authorized for detection and/or diagnosis of SARS-CoV-2 by FDA under an Emergency Use Authorization (EUA). This EUA will remain in effect (meaning this test can be used) for the duration of the COVID-19 declaration under Section 564(b)(1) of the Act, 21 U.S.C. section 360bbb-3(b)(1), unless the authorization is terminated or revoked.     Resp Syncytial Virus by PCR NEGATIVE NEGATIVE Final    Comment: (NOTE) Fact Sheet for Patients: BloggerCourse.com  Fact Sheet for Healthcare Providers: SeriousBroker.it  This test is not yet approved or cleared by the [place]  FDA and has been authorized for detection and/or diagnosis of SARS-CoV-2 by FDA under an Emergency  Use Authorization (EUA). This EUA will remain in effect (meaning this test can be used) for the duration of the COVID-19 declaration under Section 564(b)(1) of the Act, 21 U.S.C. section 360bbb-3(b)(1), unless the authorization is terminated or revoked.  Performed at [facility], [address] [place], [place] [address]   Respiratory (~20 pathogens) panel by PCR     Status: None   Collection Time: 07/26/23  8:28 PM   Specimen: Nasopharyngeal Swab; Respiratory  Result Value Ref Range Status   Adenovirus NOT DETECTED NOT DETECTED Final   Coronavirus 229E NOT DETECTED NOT DETECTED Final    Comment: (NOTE) The Coronavirus on the Respiratory Panel, DOES NOT test for the novel  Coronavirus (2019 nCoV)    Coronavirus HKU1 NOT DETECTED NOT DETECTED Final   Coronavirus NL63 NOT DETECTED NOT DETECTED Final   Coronavirus OC43 NOT DETECTED NOT DETECTED Final   Metapneumovirus NOT DETECTED NOT DETECTED Final   Rhinovirus / Enterovirus NOT DETECTED NOT DETECTED Final   Influenza A NOT DETECTED NOT DETECTED Final   Influenza B NOT DETECTED NOT DETECTED Final   Parainfluenza Virus 1 NOT DETECTED NOT DETECTED Final   Parainfluenza Virus 2 NOT DETECTED NOT DETECTED Final   Parainfluenza Virus 3 NOT DETECTED NOT DETECTED Final   Parainfluenza Virus 4 NOT DETECTED NOT DETECTED Final   Respiratory Syncytial Virus NOT DETECTED NOT DETECTED Final   Bordetella pertussis NOT DETECTED NOT DETECTED Final   Bordetella Parapertussis NOT DETECTED NOT DETECTED Final   Chlamydophila pneumoniae NOT DETECTED NOT DETECTED Final   Mycoplasma pneumoniae NOT DETECTED NOT DETECTED Final    Comment: Performed at [facility], [address] [place], [place] [address]  Urine Culture     Status: Abnormal   Collection Time: 07/26/23  8:57 PM   Specimen: Urine, Clean Catch  Result Value Ref Range Status   Specimen Description   Final    URINE, CLEAN CATCH Performed at [facility], [address], [place], [place] [address]    Special Requests   Final    NONE Performed at [facility], [address] [place], [place] [address]    Culture (A)  Final    <10,000 COLONIES/mL INSIGNIFICANT GROWTH Performed at [facility], [address], [place], [place] [address]    Report Status 07/27/2023 FINAL  Final  Culture, blood (routine x 2)     Status: None (Preliminary result)   Collection Time: 07/26/23  9:56 PM   Specimen: BLOOD  Result Value Ref Range Status  Specimen Description   Final    BLOOD LEFT ANTECUBITAL Performed at [facility], [address], [place], [place] [address]    Special Requests   Final    BOTTLES DRAWN AEROBIC AND ANAEROBIC Blood Culture adequate volume Performed at [facility], [address], [place], [place] [address]    Culture   Final    NO GROWTH 2 DAYS Performed at [facility], [address], [place], [place] [address]    Report Status PENDING  Incomplete    FURTHER DISCHARGE INSTRUCTIONS:   Get Medicines reviewed and adjusted: Please take all your medications with you for your next visit with your Primary MD   Laboratory/radiological data: Please request your Primary MD to go over all hospital tests and procedure/radiological results at the follow up, please ask your Primary MD to get all Hospital records sent to his/her office.   In some cases, they will be blood work, cultures and biopsy results pending at the time of your discharge. Please request that your primary care M.D. goes through all the records of your hospital data and follows up on these results.   Also Note the following: If you experience worsening of your admission symptoms, develop shortness of breath, life threatening emergency, suicidal or homicidal thoughts you must seek medical attention immediately by calling 911 or calling your MD immediately  if symptoms less severe.    You must read complete instructions/literature along with all the possible adverse reactions/side effects for all the Medicines you take and that have been prescribed to you. Take any new Medicines after you have completely understood and accpet all the possible adverse reactions/side effects.    patient was instructed, not to drive, operate heavy machinery, perform activities at heights, swimming or participation in water activities or provide baby-sitting services while on Pain, Sleep and Anxiety Medications; until their outpatient Physician has advised to do so again. Also recommended to not to take more than prescribed Pain, Sleep and Anxiety Medications.  It is not advisable to combine anxiety, sleep and pain medications without talking with your primary care provider.     Wear Seat belts while driving.   Please note: You were cared for by a hospitalist during your hospital stay. Once you are discharged, your primary care physician will handle any further medical issues. Please note that NO REFILLS for any discharge medications will be authorized once you are discharged, as it is imperative that you return to your primary care physician (or establish a relationship with a primary care physician if you do not have one) for your post hospital discharge needs so that they can reassess your need for medications and monitor your lab values  Time coordinating discharge: Over 30 minutes  SIGNED:   Fredia Skeeter, MD  [facility] 07/29/2023, 11:07 AM *Please note that this is a verbal dictation therefore any spelling or grammatical errors are due to the Dragon Medical One system interpretation. If 7PM-7AM, please contact night-coverage [email or web address]

## 2023-07-31 LAB — CULTURE, BLOOD (ROUTINE X 2)
Culture: NO GROWTH
Special Requests: ADEQUATE

## 2023-08-01 ENCOUNTER — Other Ambulatory Visit: Payer: Self-pay

## 2023-08-01 LAB — CULTURE, BLOOD (ROUTINE X 2)
Culture: NO GROWTH
Special Requests: ADEQUATE

## 2023-08-05 ENCOUNTER — Other Ambulatory Visit: Payer: Self-pay

## 2023-08-05 NOTE — Progress Notes (Signed)
 Advanced Heart Failure Clinic Note   PCP: Dr. Lannie Purdue ([facility]) Primary Cardiologist: Lonni Cash, MD  HF Cardiology: Dr. Rolan   HPI: 68 y.o. with history of chronic atrial fibrillation and end-stage nonischemic cardiomyopathy.  Patient had CVA in 2010, since that time has had expressive aphasia and right-sided weakness.  Christopher lives with his brother.  In 8/18, Christopher was admited with new atrial flutter and EF was found to be 20%.  Christopher had cath in 8/18 showing mild nonobstructive coronary disease.  In 10/18, Christopher had DCCV to NSR.  In 11/18 while in NSR, echo showed EF up to 45-50%.  By 2019, Christopher was back in atrial fibrillation.  Christopher was not cardioverted again. Echo in 4/21 showed EF 20-25%.     Patient was admitted in 10/21 with h/o increased dyspnea with exertion and developed abdominal swelling.  Failed outpatient diuretic titration. Admitted for IV diuretics. Echo repeated and EF was 20% with mod-severe RV dilation and moderate RV dysfunction, D-shaped septum, dilated IVC, severe biatrial enlargement.  Christopher was in atrial fibrillation in 50s-60s. CT of abdomen showed large volume ascites, had paracentesis with 4.8 L out.  Cardiac MRI showed LV EF 24%, RV mildly dilated with EF 42%, extensive LGE in a non-coronary distribution throughout much of the LV, also involving RV free wall and left and right atrial walls, ECV 42%. This was suggestive of infiltrative disease. 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  Christopher was initially on milrinone  for low output HF, this was weaned off for DCCV which Christopher failed. RHC 11/11/19 off milrinone  showed optimized filling pressures but low cardiac output, milrinone  restarted at 0.125. Discharged w/ home milrinone  + home health.  Christopher remained in atrial fibrillation.  Christopher was not thought to be a  candidate for LVAD.   Echo in 6/22 showd EF 25-30%, mild LV dilation, mild LVH, moderately decreased RV systolic function, IVC dilated, PASP 49 mmHg.  Echo in 6/23 showed EF 25-30%, global hypokinesis, mild LV dilation, moderately decreased RV systolic function, severe biatrial enlargement, mild MR.   Echo 8/24 EF 25-30%, no LVH, moderate decreased RV systolic function, moderate MR, severe biatrial enlargement, IVC dilated.   Admitted 7/25 with sepsis, no clear source identified. PICC line had previously been pulled out and considered source of infection. Started on IV abx but no indication to continue at discharge. Christopher was discharged home on current milrinone  dose 0.125.  Today Christopher returns for post hospital HF follow up with his niece. Niece provides most of history due to patient's aphasia. Overall feeling fatigued, has a headache today. Christopher walks with a cane due to CVA and knee OA. Christopher is not SOB walking on flat ground. Denies palpitations, abnormal bleeding, CP, dizziness, edema, or PND/Orthopnea. Appetite ok. Taking all medications, farxiga  on hold. No issues with milrinone  pump.   ECG (personally reviewed): atrial fibrillation 96 bpm  Labs (2/24): hgb 13.5, creatinine 1.43 Labs (4/24): LDL 81 Labs (7/24): hgb 13.5, plts 139, K 4, creatinine 1.38 Labs (10/24): K 4.5, creatinine 1.55, hgb 13.3, plts 164 Labs (1/25): K 4.9, creatinine 1.51 Labs (6/25): K 4.5, creatinine 1.58 Labs (7/25): K 4.1, creatinine 1.14, hgb 11.7  Review of systems complete and found to be negative unless listed in HPI.    PMH: 1. 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  2. DM2 3. Atrial fibrillation: Chronic. Has failed DCCV and amiodarone .  4. Chronic systolic CHF: End-stage nonischemic cardiomyopathy.   - LHC (8/18): Mild nonobstructive CAD.  - RHC (11/21): mean RA 7, PA 29/9, mean PCWP 8, CI 1.6 (thermodilution)/2.5 (Fick) - Echo (11/21): EF 15-20%, D-shaped interventricular septum,  severe dilated and severely dysfunctional RV, severe biatrial enlargement.  - Cardiac MRI (11/21): LV EF 24%, RV mildly dilated with EF 42%, extensive LGE in a non-coronary distribution throughout much of the LV, also involving RV free wall and left and right atrial walls, ECV 42%. This was suggestive of infiltrative disease. 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  - milrinone  dependent - Echo (6/22): EF 25-30%, mild LV dilation, mild LVH, moderately decreased RV systolic function, IVC dilated, PASP 49 mmHg. - Echo (6/23): EF 25-30%, mild LV dilation, mild LVH, moderately decreased RV systolic function, IVC dilated, PASP 49 mmHg.  Echo in 6/23 showed EF 25-30%, global hypokinesis, mild LV dilation, moderately decreased RV systolic function, severe biatrial enlargement, mild MR.  - Echo (8/24): EF 25-30%, no LVH, moderate decreased RV systolic function, moderate MR, severe biatrial enlargement, IVC dilated.    Current Outpatient Medications  Medication Sig Dispense Refill   acetaminophen  (TYLENOL ) 500 MG tablet Take 1,000 mg by mouth every 6 (six) hours as needed for pain.     ELIQUIS  5 MG TABS tablet TAKE 1 TABLET(5 MG) BY MOUTH TWICE DAILY 180 tablet 2   Eplontersen  Sodium ([name] ) 45 MG/0.8ML SOAJ Inject 45 mg into the skin every 30 (thirty) days. (Patient taking differently: Inject 45 mg into the skin every 30 (thirty) days. 16th of every month.) 0.8 mL 11   ergocalciferol (VITAMIN D2) 1.25 MG (50000 UT) capsule Take 50,000 Units by mouth 2 (two) times a week.     glipiZIDE  (GLUCOTROL  XL) 10 MG 24 hr tablet Take 10 mg by mouth daily with breakfast.     linagliptin  (TRADJENTA ) 5 MG TABS tablet Take 5 mg by mouth daily.     loratadine  (CLARITIN ) 10 MG tablet Take 10 mg by mouth daily as needed for allergies.     milrinone  (PRIMACOR ) 20 MG/100 ML SOLN infusion Inject 0.0076 mg/min into the vein continuous. 100 mL 0    Omega-3 Fatty Acids (FISH OIL PO) Take 1 capsule by mouth daily.     potassium chloride  (KLOR-CON ) 10 MEQ tablet Take 2 tablets (20 mEq total) by mouth daily. (Patient taking differently: Take 10 mEq by mouth 2 (two) times daily.) 180 tablet 3   simvastatin  (ZOCOR ) 40 MG tablet Take 1 tablet (40 mg total) by mouth daily. (Patient taking differently: Take 40 mg by mouth at bedtime.) 30 tablet 11   spironolactone  (ALDACTONE ) 25 MG tablet Take 1 tablet (25 mg total) by mouth daily. 90 tablet 3   Tafamidis  (VYNDAMAX ) 61 MG CAPS Take 1 capsule by mouth daily. 90 capsule 3   torsemide  (DEMADEX ) 20 MG tablet Take 1.5 tablets (30 mg total) by mouth daily. 45 tablet 11   TRADJENTA  5 MG TABS tablet Take 1 tablet (5 mg total) by mouth daily. (Patient not taking: Reported on 08/08/2023) 30 tablet 0   No current facility-administered medications for this encounter.   No Known Allergies  Social History   Socioeconomic History   Marital status: Single    Spouse name: Not on file   Number of children: Not on file   Years of education:  Not on file   Highest education level: Not on file  Occupational History   Not on file  Tobacco Use   Smoking status: Never   Smokeless tobacco: Never  Vaping Use   Vaping status: Never Used  Substance and Sexual Activity   Alcohol use: No   Drug use: No   Sexual activity: Not Currently  Other Topics Concern   Not on file  Social History Narrative   Not on file   Social Drivers of Health   Financial Resource Strain: Not on file  Food Insecurity: Patient Declined (07/27/2023)   Hunger Vital Sign    Worried About Running Out of Food in the Last Year: Patient declined    Ran Out of Food in the Last Year: Patient declined  Transportation Needs: Patient Declined (07/27/2023)   PRAPARE - Administrator, Civil Service (Medical): Patient declined    Lack of Transportation (Non-Medical): Patient declined  Physical Activity: Not on file  Stress: Not on  file  Social Connections: Patient Declined (07/27/2023)   Social Connection and Isolation Panel    Frequency of Communication with Friends and Family: Patient declined    Frequency of Social Gatherings with Friends and Family: Patient declined    Attends Religious Services: Patient declined    Database administrator or Organizations: Patient declined    Attends Banker Meetings: Patient declined    Marital Status: Patient declined  Intimate Partner Violence: Patient Declined (07/27/2023)   Humiliation, Afraid, Rape, and Kick questionnaire    Fear of Current or Ex-Partner: Patient declined    Emotionally Abused: Patient declined    Physically Abused: Patient declined    Sexually Abused: Patient declined   Family History  Problem Relation Age of Onset   Hypertension Mother    Diabetes Mother    Heart disease Father    BP (!) 106/58   Pulse (!) 112   Ht 5' 7 (1.702 m)   Wt 59.1 kg (130 lb 4.8 oz)   SpO2 99%   BMI 20.41 kg/m   Wt Readings from Last 3 Encounters:  08/08/23 59.1 kg (130 lb 4.8 oz)  07/29/23 59.7 kg (131 lb 9.6 oz)  07/09/23 58.2 kg (128 lb 6.4 oz)   PHYSICAL EXAM: General:  NAD. No resp difficulty, walked into clinic with cane, thin HEENT: Normal Neck: Supple. No JVD. Cor: Irregular rate & rhythm. No rubs, gallops or murmurs. Lungs: Clear,  Abdomen: Soft, nontender, nondistended.  Extremities: No cyanosis, clubbing, rash, edema Neuro: Alert & oriented x 3, moves all 4 extremities w/o difficulty. Affect pleasant. + aphasia  ASSESSMENT & PLAN: 1. Chronic systolic CHF: Nonischemic cardiomyopathy known since 2018.  Improved initially in 2018 with DCCV, but has been back in atrial fibrillation since 2019 it appears and EF has been low. Admission 10/21 for a/c HF w/ low output requiring milrinone . Echo showed EF 15-20% with mod-severe RV dilation and moderate RV dysfunction, D-shaped septum, dilated IVC, severe biatrial enlargement. Significant RV  failure with ascites on CT abdomen, had paracentesis.  Cardiac MRI showed LV EF 24%, RV mildly dilated with EF 42%, extensive LGE in a non-coronary distribution throughout much of the LV, also involving RV free wall and left and right atrial walls, ECV 42%. This was suggestive of infiltrative disease. 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  Christopher was initially on milrinone  for low output HF, this was weaned off for DCCV which Christopher failed. RHC 11/11/19 off milrinone  showed optimized filling pressures but low cardiac output, milrinone  restarted and has been continued since that time. Christopher was sent home in 11/21 on hospice but hospice released him. Echo in 6/23 showed EF 25-30%, moderate RV dysfunction.  Echo 8/24 showed EF 25-30%, no LVH, moderate decreased RV systolic function, moderate MR, severe biatrial enlargement, IVC dilated. NYHA II, Christopher does not appear volume overloaded on exam. - Continue home milrinone  0.125, Christopher has done quite well with this. PICC now in chest.  Labs reviewed from 07/29/23 and are stable, K 4.1, SCr 1.14 - Continue torsemide  30 mg daily + 20 KCL daily.  - Restart Farxiga  10 mg daily - Continue spiro 25 mg daily.  - BP too low for ARB/ARNi. - No digoxin  with history of bradycardia.   - Narrow QRS, not CRT candidate.  - Not a transplant candidate and not a good candidate for LVAD. Patient assessed for LVAD, we had significant concerns about his right arm/hand weakness.  Christopher would be unable to manage his LVAD equipment and would need consistent 24 hr caregiver (not available).       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  Christopher has also been started on eplontersen  with improvement in peripheral neuropathy symptoms.  2. Atrial  fibrillation/flutter: Now permanent. Initially in 2018, EF improved after DCCV.  However, as above, Christopher has been relatively well rate controlled and has not been in RVR, so do not think that cardiomyopathy is primarily due to AF.  Christopher failed DCCV on amiodarone  10/21, amiodarone  stopped due to bradycardia. Christopher is in rate controlled AF today. - Continue Eliquis  5 mg bid, no bleeding issues. Most recent hgb 11.7 on 07/29/23.  3. Ascites: Suspect due to RV failure. S/p Paracentesis on 10/31 with 4.8 L out.  - Stable without evidence of recurrence on exam today.  4. H/o CVA: In 2010, with chronic expressive aphasia and right-sided weakness.  - Stable, no change.  5. Hyperlipidemia: Lipids stable in 4/24. Will have HH RN get lipids at next lab draw.  Follow up in 3 months with Dr. Rolan.  Harlene HERO Zenda, FNP 08/08/23

## 2023-08-07 ENCOUNTER — Telehealth (HOSPITAL_COMMUNITY): Payer: Self-pay

## 2023-08-07 ENCOUNTER — Other Ambulatory Visit: Payer: Self-pay

## 2023-08-07 NOTE — Telephone Encounter (Signed)
 Called to confirm/remind patient of their appointment at the Advanced Heart Failure Clinic on 08/08/23.   Appointment:   [] Confirmed  [x] Left mess   [] No answer/No voice mail  [] VM Full/unable to leave message  [] Phone not in service  And to bring in all medications and/or complete list.

## 2023-08-08 ENCOUNTER — Encounter (HOSPITAL_COMMUNITY): Payer: Self-pay

## 2023-08-08 ENCOUNTER — Ambulatory Visit (HOSPITAL_COMMUNITY): Payer: Self-pay | Admitting: Family Medicine

## 2023-08-08 ENCOUNTER — Ambulatory Visit (HOSPITAL_COMMUNITY)
Admit: 2023-08-08 | Discharge: 2023-08-08 | Disposition: A | Discharge status: Home / Self Care | Attending: Family Medicine | Admitting: Family Medicine

## 2023-08-08 VITALS — BP 106/58 | HR 112 | Ht 67.0 in | Wt 130.3 lb

## 2023-08-08 DIAGNOSIS — E854 Organ-limited amyloidosis: Secondary | ICD-10-CM | POA: Insufficient documentation

## 2023-08-08 DIAGNOSIS — Z79899 Other long term (current) drug therapy: Secondary | ICD-10-CM | POA: Diagnosis not present

## 2023-08-08 DIAGNOSIS — I69320 Aphasia following cerebral infarction: Secondary | ICD-10-CM | POA: Diagnosis not present

## 2023-08-08 DIAGNOSIS — Z1589 Genetic susceptibility to other disease: Secondary | ICD-10-CM | POA: Insufficient documentation

## 2023-08-08 DIAGNOSIS — I4892 Unspecified atrial flutter: Secondary | ICD-10-CM | POA: Insufficient documentation

## 2023-08-08 DIAGNOSIS — E785 Hyperlipidemia, unspecified: Secondary | ICD-10-CM | POA: Insufficient documentation

## 2023-08-08 DIAGNOSIS — M179 Osteoarthritis of knee, unspecified: Secondary | ICD-10-CM | POA: Diagnosis not present

## 2023-08-08 DIAGNOSIS — I4439 Other atrioventricular block: Secondary | ICD-10-CM | POA: Insufficient documentation

## 2023-08-08 DIAGNOSIS — I69351 Hemiplegia and hemiparesis following cerebral infarction affecting right dominant side: Secondary | ICD-10-CM | POA: Insufficient documentation

## 2023-08-08 DIAGNOSIS — Z87898 Personal history of other specified conditions: Secondary | ICD-10-CM | POA: Diagnosis not present

## 2023-08-08 DIAGNOSIS — I5084 End stage heart failure: Secondary | ICD-10-CM | POA: Insufficient documentation

## 2023-08-08 DIAGNOSIS — I5022 Chronic systolic (congestive) heart failure: Secondary | ICD-10-CM | POA: Diagnosis present

## 2023-08-08 DIAGNOSIS — I43 Cardiomyopathy in diseases classified elsewhere: Secondary | ICD-10-CM | POA: Insufficient documentation

## 2023-08-08 DIAGNOSIS — R5383 Other fatigue: Secondary | ICD-10-CM | POA: Insufficient documentation

## 2023-08-08 DIAGNOSIS — I482 Chronic atrial fibrillation, unspecified: Secondary | ICD-10-CM | POA: Insufficient documentation

## 2023-08-08 DIAGNOSIS — R519 Headache, unspecified: Secondary | ICD-10-CM | POA: Insufficient documentation

## 2023-08-08 DIAGNOSIS — I480 Paroxysmal atrial fibrillation: Secondary | ICD-10-CM

## 2023-08-08 DIAGNOSIS — Z7901 Long term (current) use of anticoagulants: Secondary | ICD-10-CM | POA: Diagnosis not present

## 2023-08-08 DIAGNOSIS — I428 Other cardiomyopathies: Secondary | ICD-10-CM | POA: Insufficient documentation

## 2023-08-08 DIAGNOSIS — Z7984 Long term (current) use of oral hypoglycemic drugs: Secondary | ICD-10-CM | POA: Insufficient documentation

## 2023-08-08 DIAGNOSIS — Z95828 Presence of other vascular implants and grafts: Secondary | ICD-10-CM | POA: Diagnosis not present

## 2023-08-08 DIAGNOSIS — E1142 Type 2 diabetes mellitus with diabetic polyneuropathy: Secondary | ICD-10-CM | POA: Insufficient documentation

## 2023-08-08 DIAGNOSIS — R188 Other ascites: Secondary | ICD-10-CM | POA: Diagnosis not present

## 2023-08-08 MED ORDER — DAPAGLIFLOZIN PROPANEDIOL 10 MG PO TABS: 10.0000 mg | ORAL_TABLET | Freq: Every day | ORAL | 1 refills | Status: DC

## 2023-08-08 NOTE — Patient Instructions (Addendum)
 Good to see you today!    RESTART farxiga  10 mg daily  Repeat lipids  lab work with home health  Your physician recommends that you schedule a follow-up appointment 3 months with Dr. Dirk) Call office in  September to schedule an appointment  If you have any questions or concerns before your next appointment please send us  a message through [facility] or call our office at [phone].    TO LEAVE A MESSAGE FOR THE NURSE SELECT OPTION 2, PLEASE LEAVE A MESSAGE INCLUDING: YOUR NAME DATE OF BIRTH CALL BACK NUMBER REASON FOR CALL**this is important as we prioritize the call backs  YOU WILL RECEIVE A CALL BACK THE SAME DAY AS LONG AS YOU CALL BEFORE 4:00 PM At the Advanced Heart Failure Clinic, you and your health needs are our priority. As part of our continuing mission to provide you with exceptional heart care, we have created designated Provider Care Teams. These Care Teams include your primary Cardiologist (physician) and Advanced Practice Providers (APPs- Physician Assistants and Nurse Practitioners) who all work together to provide you with the care you need, when you need it.   You may see any of the following providers on your designated Care Team at your next follow up: Dr Toribio Fuel Dr Ezra Shuck Dr. Ria Commander Dr. Morene Brownie Amy Lenetta, NP Caffie Shed, [place] [facility] [place], [place] Beckey Coe, NP [place] Lee, NP Ellouise Class, NP Tinnie Redman, PharmD Jaun Bash, PharmD   Please be sure to bring in all your medications bottles to every appointment.    Thank you for choosing [facility]

## 2023-08-11 ENCOUNTER — Other Ambulatory Visit: Payer: Self-pay

## 2023-08-11 NOTE — Progress Notes (Signed)
 Specialty Pharmacy Refill Coordination Note  Spoke to patient's niece, Reynald Woods is a 68 y.o. male contacted today regarding refills of specialty medication(s) Eplontersen  Sodium ([name] )   Patient requested Delivery   Delivery date: 08/14/23   Verified address: [address] [place] [address]   Medication will be filled on 08/16/23.

## 2023-08-12 ENCOUNTER — Other Ambulatory Visit: Payer: Self-pay

## 2023-08-13 ENCOUNTER — Encounter: Payer: Self-pay | Admitting: Podiatry

## 2023-08-13 ENCOUNTER — Ambulatory Visit (INDEPENDENT_AMBULATORY_CARE_PROVIDER_SITE_OTHER): Admitting: Podiatry

## 2023-08-13 DIAGNOSIS — M79674 Pain in right toe(s): Secondary | ICD-10-CM | POA: Diagnosis not present

## 2023-08-13 DIAGNOSIS — E1151 Type 2 diabetes mellitus with diabetic peripheral angiopathy without gangrene: Secondary | ICD-10-CM | POA: Diagnosis not present

## 2023-08-13 DIAGNOSIS — B351 Tinea unguium: Secondary | ICD-10-CM

## 2023-08-13 DIAGNOSIS — M79675 Pain in left toe(s): Secondary | ICD-10-CM

## 2023-08-13 NOTE — Progress Notes (Addendum)
This patient returns to my office for at risk foot care.  This patient requires this care by a professional since this patient will be at risk due to having type 2 diabetes.  This patient is unable to cut nails himself since the patient cannot reach his nails.These nails are painful walking and wearing shoes. He presents with male caregiver. This patient presents for at risk foot care today.  General Appearance  Alert, conversant and in no acute stress.  Vascular  Dorsalis pedis and posterior tibial  pulses are  weakly palpable  right.  Absent dorsalis pedis and posterior tibial pulses  left.  Capillary return is within normal limits  bilaterally. Temperature is within normal limits  bilaterally.  Neurologic  Senn-Weinstein monofilament wire test within normal limits  bilaterally. Muscle power within normal limits bilaterally.  Nails Thick disfigured discolored nails with subungual debris  from hallux to fifth toes bilaterally. No evidence of bacterial infection or drainage bilaterally.  Orthopedic  No limitations of motion  feet .  No crepitus or effusions noted.  No bony pathology or digital deformities noted.  Skin  normotropic skin with no porokeratosis noted bilaterally.  No signs of infections or ulcers noted.     Onychomycosis  Pain in right toes  Pain in left toes  Consent was obtained for treatment procedures.   Mechanical debridement of nails 1-5  bilaterally performed with a nail nipper.  Filed with dremel without incident.    Return office visit    3 months                  Told patient to return for periodic foot care and evaluation due to potential at risk complications.   Samari Bittinger DPM    

## 2023-08-14 ENCOUNTER — Other Ambulatory Visit (HOSPITAL_COMMUNITY): Payer: Self-pay

## 2023-08-14 ENCOUNTER — Other Ambulatory Visit: Payer: Self-pay

## 2023-08-14 MED ORDER — POTASSIUM CHLORIDE ER 10 MEQ PO TBCR
10.0000 meq | EXTENDED_RELEASE_TABLET | Freq: Two times a day (BID) | ORAL | 1 refills | Status: DC
  Filled 2023-08-14: qty 60, 30d supply, fill #0

## 2023-08-26 ENCOUNTER — Other Ambulatory Visit: Payer: Self-pay

## 2023-09-08 DEATH — deceased

## 2023-09-15 ENCOUNTER — Other Ambulatory Visit (HOSPITAL_COMMUNITY): Payer: Self-pay

## 2023-09-15 ENCOUNTER — Other Ambulatory Visit: Payer: Self-pay

## 2023-09-15 ENCOUNTER — Other Ambulatory Visit: Payer: Self-pay | Admitting: Pharmacy Technician

## 2023-09-16 ENCOUNTER — Other Ambulatory Visit: Payer: Self-pay

## 2023-09-18 ENCOUNTER — Other Ambulatory Visit (HOSPITAL_COMMUNITY): Payer: Self-pay

## 2023-09-18 ENCOUNTER — Other Ambulatory Visit: Payer: Self-pay

## 2023-09-19 ENCOUNTER — Other Ambulatory Visit: Payer: Self-pay

## 2023-09-22 ENCOUNTER — Other Ambulatory Visit: Payer: Self-pay

## 2023-09-23 ENCOUNTER — Other Ambulatory Visit: Payer: Self-pay

## 2023-09-24 ENCOUNTER — Other Ambulatory Visit: Payer: Self-pay

## 2023-09-29 ENCOUNTER — Other Ambulatory Visit (HOSPITAL_COMMUNITY): Payer: Self-pay

## 2023-10-07 ENCOUNTER — Other Ambulatory Visit: Payer: Self-pay

## 2023-10-07 ENCOUNTER — Other Ambulatory Visit (HOSPITAL_BASED_OUTPATIENT_CLINIC_OR_DEPARTMENT_OTHER): Payer: Self-pay

## 2023-10-07 NOTE — Progress Notes (Signed)
 Spoke with niece Lunda, patient is deceased as of 09-06-23. Dis-enrolling from program.

## 2023-10-08 ENCOUNTER — Telehealth (HOSPITAL_COMMUNITY): Payer: Self-pay | Admitting: Cardiology

## 2023-10-08 NOTE — Telephone Encounter (Signed)
 Called to confirm/remind patient of their appointment at the Advanced Heart Failure Clinic on 10/08/23.   Appointment:   [] Confirmed  [x] Left mess   [] No answer/No voice mail  [] VM Full/unable to leave message  [] Phone not in service  Patient reminded to bring all medications and/or complete list.  Confirmed patient has transportation. Gave directions, instructed to utilize valet parking.

## 2023-10-09 ENCOUNTER — Encounter (HOSPITAL_COMMUNITY): Admitting: Cardiology

## 2023-11-12 ENCOUNTER — Ambulatory Visit: Admitting: Podiatry
